# Patient Record
Sex: Male | Born: 1950
Health system: Southern US, Community
[De-identification: ages and names within clinical notes are randomized; demographics above are authoritative.]

## PROBLEM LIST (undated history)

## (undated) DIAGNOSIS — R51 Headache: Secondary | ICD-10-CM

## (undated) DIAGNOSIS — I10 Essential (primary) hypertension: Secondary | ICD-10-CM

## (undated) DIAGNOSIS — R519 Headache, unspecified: Secondary | ICD-10-CM

## (undated) DIAGNOSIS — T7840XA Allergy, unspecified, initial encounter: Secondary | ICD-10-CM

## (undated) DIAGNOSIS — C801 Malignant (primary) neoplasm, unspecified: Secondary | ICD-10-CM

## (undated) DIAGNOSIS — F0781 Postconcussional syndrome: Secondary | ICD-10-CM

## (undated) DIAGNOSIS — E119 Type 2 diabetes mellitus without complications: Secondary | ICD-10-CM

## (undated) HISTORY — PX: COLONOSCOPY: SHX174

## (undated) HISTORY — PX: WISDOM TOOTH EXTRACTION: SHX21

## (undated) HISTORY — DX: Headache: R51

## (undated) HISTORY — DX: Allergy, unspecified, initial encounter: T78.40XA

## (undated) HISTORY — DX: Headache, unspecified: R51.9

## (undated) MED FILL — Fosaprepitant Dimeglumine For IV Infusion 150 MG (Base Eq): INTRAVENOUS | Qty: 5 | Status: AC

---
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
## 2010-09-10 ENCOUNTER — Emergency Department: Payer: Self-pay | Admitting: Emergency Medicine

## 2010-10-12 ENCOUNTER — Emergency Department: Payer: Self-pay | Admitting: Emergency Medicine

## 2010-12-28 ENCOUNTER — Emergency Department: Payer: Self-pay | Admitting: Emergency Medicine

## 2012-11-28 ENCOUNTER — Emergency Department: Payer: Self-pay | Admitting: Emergency Medicine

## 2014-10-13 ENCOUNTER — Other Ambulatory Visit: Payer: Self-pay | Admitting: Neurology

## 2014-10-13 DIAGNOSIS — G44321 Chronic post-traumatic headache, intractable: Secondary | ICD-10-CM

## 2014-10-17 ENCOUNTER — Ambulatory Visit
Admission: RE | Admit: 2014-10-17 | Discharge: 2014-10-17 | Disposition: A | Discharge status: Home / Self Care | Payer: Medicare HMO | Source: Ambulatory Visit | Attending: Neurology | Admitting: Neurology

## 2014-10-17 DIAGNOSIS — G44321 Chronic post-traumatic headache, intractable: Secondary | ICD-10-CM | POA: Insufficient documentation

## 2014-10-17 MED ORDER — GADOBENATE DIMEGLUMINE 529 MG/ML IV SOLN: 20.0000 mL | Freq: Once | INTRAVENOUS | Status: AC | PRN

## 2015-10-18 ENCOUNTER — Encounter: Payer: Self-pay | Admitting: Emergency Medicine

## 2015-10-18 ENCOUNTER — Emergency Department
Admission: EM | Admit: 2015-10-18 | Discharge: 2015-10-18 | Disposition: A | Discharge status: Home / Self Care | Payer: Medicare HMO | Attending: Emergency Medicine | Admitting: Emergency Medicine

## 2015-10-18 DIAGNOSIS — E1165 Type 2 diabetes mellitus with hyperglycemia: Secondary | ICD-10-CM | POA: Insufficient documentation

## 2015-10-18 DIAGNOSIS — R739 Hyperglycemia, unspecified: Secondary | ICD-10-CM

## 2015-10-18 DIAGNOSIS — F17210 Nicotine dependence, cigarettes, uncomplicated: Secondary | ICD-10-CM | POA: Diagnosis not present

## 2015-10-18 DIAGNOSIS — R350 Frequency of micturition: Secondary | ICD-10-CM | POA: Insufficient documentation

## 2015-10-18 DIAGNOSIS — I10 Essential (primary) hypertension: Secondary | ICD-10-CM | POA: Diagnosis not present

## 2015-10-18 HISTORY — DX: Postconcussional syndrome: F07.81

## 2015-10-18 HISTORY — DX: Essential (primary) hypertension: I10

## 2015-10-18 HISTORY — DX: Type 2 diabetes mellitus without complications: E11.9

## 2015-10-18 LAB — BASIC METABOLIC PANEL
Anion gap: 12 (ref 5–15)
BUN: 39 mg/dL — AB (ref 6–20)
CALCIUM: 9.7 mg/dL (ref 8.9–10.3)
CO2: 23 mmol/L (ref 22–32)
Chloride: 95 mmol/L — ABNORMAL LOW (ref 101–111)
Creatinine, Ser: 2.06 mg/dL — ABNORMAL HIGH (ref 0.61–1.24)
GFR calc Af Amer: 38 mL/min — ABNORMAL LOW (ref >60)
GFR, EST NON AFRICAN AMERICAN: 32 mL/min — AB (ref >60)
Glucose, Bld: 642 mg/dL (ref 65–99)
POTASSIUM: 4.3 mmol/L (ref 3.5–5.1)
SODIUM: 130 mmol/L — AB (ref 135–145)

## 2015-10-18 LAB — CBC
HCT: 43.6 % (ref 40.0–52.0)
Hemoglobin: 15.2 g/dL (ref 13.0–18.0)
MCH: 31.3 pg (ref 26.0–34.0)
MCHC: 34.8 g/dL (ref 32.0–36.0)
MCV: 90 fL (ref 80.0–100.0)
PLATELETS: 147 10*3/uL — AB (ref 150–440)
RBC: 4.84 MIL/uL (ref 4.40–5.90)
RDW: 13.4 % (ref 11.5–14.5)
WBC: 4.9 10*3/uL (ref 3.8–10.6)

## 2015-10-18 LAB — URINALYSIS COMPLETE WITH MICROSCOPIC (ARMC ONLY)
BACTERIA UA: NONE SEEN
BILIRUBIN URINE: NEGATIVE
Leukocytes, UA: NEGATIVE
Nitrite: NEGATIVE
Protein, ur: NEGATIVE mg/dL
SPECIFIC GRAVITY, URINE: 1.025 (ref 1.005–1.030)
Squamous Epithelial / LPF: NONE SEEN
pH: 5 (ref 5.0–8.0)

## 2015-10-18 LAB — GLUCOSE, CAPILLARY
GLUCOSE-CAPILLARY: 360 mg/dL — AB (ref 65–99)
Glucose-Capillary: >600 mg/dL (ref 65–99)
Glucose-Capillary: 544 mg/dL — ABNORMAL HIGH (ref 65–99)
Glucose-Capillary: 437 mg/dL — ABNORMAL HIGH (ref 65–99)

## 2015-10-18 MED ORDER — SODIUM CHLORIDE 0.9 % IV BOLUS (SEPSIS)
1000.0000 mL | Freq: Once | INTRAVENOUS | Status: AC
  Administered 2015-10-18: 1000 mL via INTRAVENOUS

## 2015-10-18 MED ORDER — INSULIN ASPART 100 UNIT/ML ~~LOC~~ SOLN
10.0000 [IU] | Freq: Once | SUBCUTANEOUS | Status: AC
Start: 2015-10-18 — End: 2015-10-18
  Administered 2015-10-18: 10 [IU] via INTRAVENOUS
  Filled 2015-10-18: qty 10

## 2015-10-18 MED ORDER — METFORMIN HCL 500 MG PO TABS: 500.0000 mg | ORAL_TABLET | Freq: Two times a day (BID) | ORAL | Status: DC

## 2015-10-18 NOTE — Discharge Instructions (Signed)
Hyperglycemia °Hyperglycemia occurs when the glucose (sugar) in your blood is too high. Hyperglycemia can happen for many reasons, but it most often happens to people who do not know they have diabetes or are not managing their diabetes properly.  °CAUSES  °Whether you have diabetes or not, there are other causes of hyperglycemia. Hyperglycemia can occur when you have diabetes, but it can also occur in other situations that you might not be as aware of, such as: °Diabetes °· If you have diabetes and are having problems controlling your blood glucose, hyperglycemia could occur because of some of the following reasons: °· Not following your meal plan. °· Not taking your diabetes medications or not taking it properly. °· Exercising less or doing less activity than you normally do. °· Being sick. °Pre-diabetes °· This cannot be ignored. Before people develop Type 2 diabetes, they almost always have "pre-diabetes." This is when your blood glucose levels are higher than normal, but not yet high enough to be diagnosed as diabetes. Research has shown that some long-term damage to the body, especially the heart and circulatory system, may already be occurring during pre-diabetes. If you take action to manage your blood glucose when you have pre-diabetes, you may delay or prevent Type 2 diabetes from developing. °Stress °· If you have diabetes, you may be "diet" controlled or on oral medications or insulin to control your diabetes. However, you may find that your blood glucose is higher than usual in the hospital whether you have diabetes or not. This is often referred to as "stress hyperglycemia." Stress can elevate your blood glucose. This happens because of hormones put out by the body during times of stress. If stress has been the cause of your high blood glucose, it can be followed regularly by your caregiver. That way he/she can make sure your hyperglycemia does not continue to get worse or progress to  diabetes. °Steroids °· Steroids are medications that act on the infection fighting system (immune system) to block inflammation or infection. One side effect can be a rise in blood glucose. Most people can produce enough extra insulin to allow for this rise, but for those who cannot, steroids make blood glucose levels go even higher. It is not unusual for steroid treatments to "uncover" diabetes that is developing. It is not always possible to determine if the hyperglycemia will go away after the steroids are stopped. A special blood test called an A1c is sometimes done to determine if your blood glucose was elevated before the steroids were started. °SYMPTOMS °· Thirsty. °· Frequent urination. °· Dry mouth. °· Blurred vision. °· Tired or fatigue. °· Weakness. °· Sleepy. °· Tingling in feet or leg. °DIAGNOSIS  °Diagnosis is made by monitoring blood glucose in one or all of the following ways: °· A1c test. This is a chemical found in your blood. °· Fingerstick blood glucose monitoring. °· Laboratory results. °TREATMENT  °First, knowing the cause of the hyperglycemia is important before the hyperglycemia can be treated. Treatment may include, but is not be limited to: °· Education. °· Change or adjustment in medications. °· Change or adjustment in meal plan. °· Treatment for an illness, infection, etc. °· More frequent blood glucose monitoring. °· Change in exercise plan. °· Decreasing or stopping steroids. °· Lifestyle changes. °HOME CARE INSTRUCTIONS  °· Test your blood glucose as directed. °· Exercise regularly. Your caregiver will give you instructions about exercise. Pre-diabetes or diabetes which comes on with stress is helped by exercising. °· Eat wholesome,   balanced meals. Eat often and at regular, fixed times. Your caregiver or nutritionist will give you a meal plan to guide your sugar intake.  Being at an ideal weight is important. If needed, losing as little as 10 to 15 pounds may help improve blood  glucose levels. SEEK MEDICAL CARE IF:   You have questions about medicine, activity, or diet.  You continue to have symptoms (problems such as increased thirst, urination, or weight gain). SEEK IMMEDIATE MEDICAL CARE IF:   You are vomiting or have diarrhea.  Your breath smells fruity.  You are breathing faster or slower.  You are very sleepy or incoherent.  You have numbness, tingling, or pain in your feet or hands.  You have chest pain.  Your symptoms get worse even though you have been following your caregiver's orders.  If you have any other questions or concerns.   This information is not intended to replace advice given to you by your health care provider. Make sure you discuss any questions you have with your health care provider.   Document Released: 11/23/2000 Document Revised: 08/22/2011 Document Reviewed: 02/03/2015 Elsevier Interactive Patient Education 2016 [employer].  Urinary Frequency The number of times a normal person urinates depends upon how much liquid they take in and how much liquid they are losing. If the temperature is hot and there is high humidity, then the person will sweat more and usually breathe a little more frequently. These factors decrease the amount of frequency of urination that would be considered normal. The amount you drink is easily determined, but the amount of fluid lost is sometimes more difficult to calculate.  Fluid is lost in two ways:  Sensible fluid loss is usually measured by the amount of urine that you get rid of. Losses of fluid can also occur with diarrhea.  Insensible fluid loss is more difficult to measure. It is caused by evaporation. Insensible loss of fluid occurs through breathing and sweating. It usually ranges from a little less than a quart to a little more than a quart of fluid a day. In normal temperatures and activity levels, the average person may urinate 4 to 7 times in a 24-hour period. Needing to urinate more  often than that could indicate a problem. If one urinates 4 to 7 times in 24 hours and has large volumes each time, that could indicate a different problem from one who urinates 4 to 7 times a day and has small volumes. The time of urinating is also important. Most urinating should be done during the waking hours. Getting up at night to urinate frequently can indicate some problems. CAUSES  The bladder is the organ in your lower abdomen that holds urine. Like a balloon, it swells some as it fills up. Your nerves sense this and tell you it is time to head for the bathroom. There are a number of reasons that you might feel the need to urinate more often than usual. They include:  Urinary tract infection. This is usually associated with other signs such as burning when you urinate.  In men, problems with the prostate (a walnut-size gland that is located near the tube that carries urine out of your body). There are two reasons why the prostate can cause an increased frequency of urination:  An enlarged prostate that does not let the bladder empty well. If the bladder only half empties when you urinate, then it only has half the capacity to fill before you have to urinate again.  The nerves in the bladder become more hypersensitive with an increased size of the prostate even if the bladder empties completely.  Pregnancy.  Obesity. Excess weight is more likely to cause a problem for women than for men.  Bladder stones or other bladder problems.  Caffeine.  Alcohol.  Medications. For example, drugs that help the body get rid of extra fluid (diuretics) increase urine production. Some other medicines must be taken with lots of fluids.  Muscle or nerve weakness. This might be the result of a spinal cord injury, a stroke, multiple sclerosis, or Parkinson disease.  Long-standing diabetes can decrease the sensation of the bladder. This loss of sensation makes it harder to sense the bladder needs to be  emptied. Over a period of years, the bladder is stretched out by constant overfilling. This weakens the bladder muscles so that the bladder does not empty well and has less capacity to fill with new urine.  Interstitial cystitis (also called painful bladder syndrome). This condition develops because the tissues that line the inside of the bladder are inflamed (inflammation is the body's way of reacting to injury or infection). It causes pain and frequent urination. It occurs in women more often than in men. DIAGNOSIS   To decide what might be causing your urinary frequency, your health care provider will probably:  Ask about symptoms you have noticed.  Ask about your overall health. This will include questions about any medications you are taking.  Do a physical examination.  Order some tests. These might include:  A blood test to check for diabetes or other health issues that could be contributing to the problem.  Urine testing. This could measure the flow of urine and the pressure on the bladder.  A test of your neurological system (the brain, spinal cord, and nerves). This is the system that senses the need to urinate.  A bladder test to check whether it is emptying completely when you urinate.  Cystoscopy. This test uses a thin tube with a tiny camera on it. It offers a look inside your urethra and bladder to see if there are problems.  Imaging tests. You might be given a contrast dye and then asked to urinate. X-rays are taken to see how your bladder is working. TREATMENT  It is important for you to be evaluated to determine if the amount or frequency that you have is unusual or abnormal. If it is found to be abnormal, the cause should be determined and this can usually be found out easily. Depending upon the cause, treatment could include medication, stimulation of the nerves, or surgery. There are not too many things that you can do as an individual to change your urinary  frequency. It is important that you balance the amount of fluid intake needed to compensate for your activity and the temperature. Medical problems will be diagnosed and taken care of by your physician. There is no particular bladder training such as Kegel exercises that you can do to help urinary frequency. This is an exercise that is usually recommended for people who have leaking of urine when they laugh, cough, or sneeze. HOME CARE INSTRUCTIONS   Take any medications your health care provider prescribed or suggested. Follow the directions carefully.  Practice any lifestyle changes that are recommended. These might include:  Drinking less fluid or drinking at different times of the day. If you need to urinate often during the night, for example, you may need to stop drinking fluids early in the  evening.  Cutting down on caffeine or alcohol. They both can make you need to urinate more often than normal. Caffeine is found in coffee, tea, and sodas.  Losing weight, if that is recommended.  Keep a journal or a log. You might be asked to record how much you drink and when and where you feel the need to urinate. This will also help evaluate how well the treatment provided by your physician is working. SEEK MEDICAL CARE IF:   Your need to urinate often gets worse.  You feel increased pain or irritation when you urinate.  You notice blood in your urine.  You have questions about any medications that your health care provider recommended.  You notice blood, pus, or swelling at the site of any test or treatment procedure.  You develop a fever of more than 100.16F (38.1C). SEEK IMMEDIATE MEDICAL CARE IF:  You develop a fever of more than 102.54F (38.9C).   This information is not intended to replace advice given to you by your health care provider. Make sure you discuss any questions you have with your health care provider.   Document Released: 03/26/2009 Document Revised: 06/20/2014  Document Reviewed: 03/26/2009 Elsevier Interactive Patient Education [employer].

## 2015-10-18 NOTE — ED Provider Notes (Signed)
[facility] Emergency Department Provider Note   ____________________________________________  Time seen: Approximately [id number] AM  I have reviewed the triage vital signs and the nursing notes.   HISTORY  Chief Complaint Urinary Frequency    HPI Jonathan Page is a 65 y.o. male who comes into the hospital today with increased urinary frequency and increased thirst. The patient reports that this going on for the past 2-3 weeks. His wife prompted him to come into the hospital today. He has not been to his doctor's office in some time as he is trying to obtain a new physician. He reports that he does not check his sugars regularly at home. He denies any chest pain denies shortness of breath denies headache. He's had some blurred vision but no nausea or vomiting no abdominal pain. He reports that he has been off his metformin for the past year and he was taking insulin for about 30 days but again he is not taking anything currently. He also has not been working out as much as normal. The patient is here for evaluation this evening.   Past Medical History  Diagnosis Date  . Diabetes ([place])   . Concussion syndrome   . Hypertension     There are no active problems to display for this patient.   History reviewed. No pertinent past surgical history.  Current Outpatient Rx  Name  Route  Sig  Dispense  Refill  . metFORMIN (GLUCOPHAGE) 500 MG tablet   Oral   Take 1 tablet (500 mg total) by mouth 2 (two) times daily with a meal.   60 tablet   0     Allergies Review of patient's allergies indicates no known allergies.  History reviewed. No pertinent family history.  Social History Social History  Substance Use Topics  . Smoking status: Current Every Day Smoker -- 3.00 packs/day    Types: Cigarettes  . Smokeless tobacco: None  . Alcohol Use: Yes     Comment: occasional beer    Review of Systems Constitutional: Increased thirst with No  fever/chills Eyes: No visual changes. ENT: No sore throat. Cardiovascular: Denies chest pain. Respiratory: Denies shortness of breath. Gastrointestinal: No abdominal pain.  No nausea, no vomiting.  No diarrhea.  No constipation. Genitourinary: Urinary frequency Musculoskeletal: Negative for back pain. Skin: Negative for rash. Neurological: Negative for headaches, focal weakness or numbness.  10-point ROS otherwise negative.  ____________________________________________   PHYSICAL EXAM:  VITAL SIGNS: ED Triage Vitals  Enc Vitals Group     BP 10/18/15 0521 116/79 mmHg     Pulse Rate 10/18/15 0521 82     Resp 10/18/15 0521 18     Temp 10/18/15 0521 97.8 F (36.6 C)     Temp Source 10/18/15 0521 Oral     SpO2 10/18/15 0521 97 %     Weight 10/18/15 0521 209 lb (94.802 kg)     Height 10/18/15 0521 6\' 1"  (1.854 m)     Head Cir --      Peak Flow --      Pain Score 10/18/15 0522 0     Pain Loc --      Pain Edu? --      Excl. in [place]? --     Constitutional: Alert and oriented. Well appearing and in no acute distress. Eyes: Conjunctivae are normal. PERRL. EOMI. Head: Atraumatic. Nose: No congestion/rhinnorhea. Mouth/Throat: Mucous membranes are moist.  Oropharynx non-erythematous. Cardiovascular: Normal rate, regular rhythm. Grossly normal heart sounds.  Good peripheral circulation. Respiratory: Normal respiratory effort.  No retractions. Lungs CTAB. Gastrointestinal: Soft and nontender. No distention. Positive bowel sounds Musculoskeletal: No lower extremity tenderness nor edema.   Neurologic:  Normal speech and language.  Skin:  Skin is warm, dry and intact. Psychiatric: Mood and affect are normal.   ____________________________________________   LABS (all labs ordered are listed, but only abnormal results are displayed)  Labs Reviewed  URINALYSIS COMPLETEWITH MICROSCOPIC ([place]) - Abnormal; Notable for the following:    Color, Urine STRAW (*)    APPearance CLEAR  (*)    Glucose, UA >500 (*)    Ketones, ur TRACE (*)    Hgb urine dipstick 1+ (*)    All other components within normal limits  GLUCOSE, CAPILLARY - Abnormal; Notable for the following:    Glucose-Capillary >600 (*)    All other components within normal limits  BASIC METABOLIC PANEL - Abnormal; Notable for the following:    Sodium 130 (*)    Chloride 95 (*)    Glucose, Bld 642 (*)    BUN 39 (*)    Creatinine, Ser 2.06 (*)    GFR calc non Af Amer 32 (*)    GFR calc Af Amer 38 (*)    All other components within normal limits  CBC - Abnormal; Notable for the following:    Platelets 147 (*)    All other components within normal limits  GLUCOSE, CAPILLARY - Abnormal; Notable for the following:    Glucose-Capillary 544 (*)    All other components within normal limits  CBG MONITORING, ED   ____________________________________________  EKG  None ____________________________________________  RADIOLOGY  None ____________________________________________   PROCEDURES  Procedure(s) performed: None  Critical Care performed: No  ____________________________________________   INITIAL IMPRESSION / ASSESSMENT AND PLAN / ED COURSE  Pertinent labs & imaging results that were available during my care of the patient were reviewed by me and considered in my medical decision making (see chart for details).  The patient is a 65 year old male who comes into the hospital today with polyuria and polydipsia. The patient's fingerstick blood sugar is greater than 700. He did receive a liter of normal saline and should he needed he will also receive some insulin. We will continue to reassess the patient at this time.  After a liter of normal saline the patient's glucose came down to 544. He will receive a second liter of normal saline as well as 10 units of insulin. His care will be signed out to Dr.Kinner who will follow-up the repeat CBG and disposition the patient. The patient has no  concerns at this time. ____________________________________________   FINAL CLINICAL IMPRESSION(S) / ED DIAGNOSES  Final diagnoses:  Hyperglycemia  Urinary frequency      NEW MEDICATIONS STARTED DURING THIS VISIT:  New Prescriptions   METFORMIN (GLUCOPHAGE) 500 MG TABLET    Take 1 tablet (500 mg total) by mouth 2 (two) times daily with a meal.     Note:  This document was prepared using Dragon voice recognition software and may include unintentional dictation errors.    Loney Hering, MD 10/18/15 [phone]

## 2015-10-18 NOTE — ED Notes (Signed)
Pt c/o urinary frequency over the last 2-3 weeks; has not seen his MD in [place] for same; history of diabetes and was taken off Metformin about 1 year ago because blood sugars were normal; pt denies fever; denies dysuria; denies any other symptoms

## 2015-10-18 NOTE — ED Provider Notes (Signed)
Patient's glucose has improved and is stable for discharge per Dr. Dahlia Client instructions  Jonathan Drafts, MD 10/18/15 1043

## 2015-10-22 ENCOUNTER — Encounter: Payer: Self-pay | Admitting: Emergency Medicine

## 2015-10-22 ENCOUNTER — Inpatient Hospital Stay
Admission: EM | Admit: 2015-10-22 | Discharge: 2015-10-23 | DRG: 638 | Disposition: A | Discharge status: Home / Self Care | Payer: Medicare HMO | Attending: Internal Medicine | Admitting: Internal Medicine

## 2015-10-22 DIAGNOSIS — N179 Acute kidney failure, unspecified: Secondary | ICD-10-CM | POA: Diagnosis present

## 2015-10-22 DIAGNOSIS — E871 Hypo-osmolality and hyponatremia: Secondary | ICD-10-CM

## 2015-10-22 DIAGNOSIS — Z794 Long term (current) use of insulin: Secondary | ICD-10-CM | POA: Diagnosis not present

## 2015-10-22 DIAGNOSIS — E1100 Type 2 diabetes mellitus with hyperosmolarity without nonketotic hyperglycemic-hyperosmolar coma (NKHHC): Secondary | ICD-10-CM | POA: Diagnosis present

## 2015-10-22 DIAGNOSIS — I129 Hypertensive chronic kidney disease with stage 1 through stage 4 chronic kidney disease, or unspecified chronic kidney disease: Secondary | ICD-10-CM | POA: Diagnosis present

## 2015-10-22 DIAGNOSIS — N183 Chronic kidney disease, stage 3 (moderate): Secondary | ICD-10-CM | POA: Diagnosis present

## 2015-10-22 DIAGNOSIS — E119 Type 2 diabetes mellitus without complications: Secondary | ICD-10-CM | POA: Diagnosis present

## 2015-10-22 DIAGNOSIS — E1122 Type 2 diabetes mellitus with diabetic chronic kidney disease: Secondary | ICD-10-CM | POA: Diagnosis present

## 2015-10-22 DIAGNOSIS — E1165 Type 2 diabetes mellitus with hyperglycemia: Secondary | ICD-10-CM | POA: Diagnosis present

## 2015-10-22 DIAGNOSIS — F17210 Nicotine dependence, cigarettes, uncomplicated: Secondary | ICD-10-CM | POA: Diagnosis present

## 2015-10-22 DIAGNOSIS — E875 Hyperkalemia: Secondary | ICD-10-CM | POA: Diagnosis present

## 2015-10-22 DIAGNOSIS — Z833 Family history of diabetes mellitus: Secondary | ICD-10-CM | POA: Diagnosis not present

## 2015-10-22 DIAGNOSIS — E1121 Type 2 diabetes mellitus with diabetic nephropathy: Secondary | ICD-10-CM | POA: Diagnosis present

## 2015-10-22 DIAGNOSIS — R739 Hyperglycemia, unspecified: Secondary | ICD-10-CM

## 2015-10-22 DIAGNOSIS — N289 Disorder of kidney and ureter, unspecified: Secondary | ICD-10-CM

## 2015-10-22 DIAGNOSIS — E860 Dehydration: Secondary | ICD-10-CM | POA: Diagnosis present

## 2015-10-22 LAB — URINALYSIS COMPLETE WITH MICROSCOPIC (ARMC ONLY)
Bacteria, UA: NONE SEEN
Bilirubin Urine: NEGATIVE
Hgb urine dipstick: NEGATIVE
Leukocytes, UA: NEGATIVE
Nitrite: NEGATIVE
PROTEIN: NEGATIVE mg/dL
SPECIFIC GRAVITY, URINE: 1.02 (ref 1.005–1.030)
pH: 6 (ref 5.0–8.0)

## 2015-10-22 LAB — GLUCOSE, CAPILLARY
GLUCOSE-CAPILLARY: 428 mg/dL — AB (ref 65–99)
Glucose-Capillary: 146 mg/dL — ABNORMAL HIGH (ref 65–99)
Glucose-Capillary: 198 mg/dL — ABNORMAL HIGH (ref 65–99)
Glucose-Capillary: 335 mg/dL — ABNORMAL HIGH (ref 65–99)
Glucose-Capillary: 515 mg/dL — ABNORMAL HIGH (ref 65–99)
Glucose-Capillary: >600 mg/dL (ref 65–99)
Glucose-Capillary: >600 mg/dL (ref 65–99)

## 2015-10-22 LAB — CBC
HCT: 43.7 % (ref 40.0–52.0)
Hemoglobin: 14.3 g/dL (ref 13.0–18.0)
MCH: 30.8 pg (ref 26.0–34.0)
MCHC: 32.6 g/dL (ref 32.0–36.0)
MCV: 94.5 fL (ref 80.0–100.0)
PLATELETS: 150 10*3/uL (ref 150–440)
RBC: 4.63 MIL/uL (ref 4.40–5.90)
RDW: 13.5 % (ref 11.5–14.5)
WBC: 6.5 10*3/uL (ref 3.8–10.6)

## 2015-10-22 LAB — BASIC METABOLIC PANEL
Anion gap: 14 (ref 5–15)
Anion gap: 8 (ref 5–15)
BUN: 52 mg/dL — AB (ref 6–20)
BUN: 44 mg/dL — ABNORMAL HIGH (ref 6–20)
CALCIUM: 9.1 mg/dL (ref 8.9–10.3)
CHLORIDE: 81 mmol/L — AB (ref 101–111)
CHLORIDE: 98 mmol/L — AB (ref 101–111)
CO2: 22 mmol/L (ref 22–32)
CO2: 26 mmol/L (ref 22–32)
CREATININE: 2.9 mg/dL — AB (ref 0.61–1.24)
CREATININE: 2.3 mg/dL — AB (ref 0.61–1.24)
Calcium: 9.2 mg/dL (ref 8.9–10.3)
GFR calc Af Amer: 25 mL/min — ABNORMAL LOW (ref >60)
GFR calc non Af Amer: 21 mL/min — ABNORMAL LOW (ref >60)
GFR calc non Af Amer: 28 mL/min — ABNORMAL LOW (ref >60)
GFR, EST AFRICAN AMERICAN: 33 mL/min — AB (ref >60)
GLUCOSE: 1095 mg/dL — AB (ref 65–99)
Glucose, Bld: 451 mg/dL — ABNORMAL HIGH (ref 65–99)
POTASSIUM: 3.5 mmol/L (ref 3.5–5.1)
Potassium: 5.4 mmol/L — ABNORMAL HIGH (ref 3.5–5.1)
Sodium: 117 mmol/L — CL (ref 135–145)
Sodium: 132 mmol/L — ABNORMAL LOW (ref 135–145)

## 2015-10-22 LAB — MRSA PCR SCREENING: MRSA BY PCR: NEGATIVE

## 2015-10-22 MED ORDER — SODIUM CHLORIDE 0.9% FLUSH
3.0000 mL | Freq: Two times a day (BID) | INTRAVENOUS | Status: DC
  Administered 2015-10-23: 3 mL via INTRAVENOUS

## 2015-10-22 MED ORDER — SODIUM CHLORIDE 0.9 % IV SOLN
INTRAVENOUS | Status: DC
  Administered 2015-10-22 – 2015-10-23 (×3): via INTRAVENOUS

## 2015-10-22 MED ORDER — ACETAMINOPHEN 650 MG RE SUPP: 650.0000 mg | Freq: Four times a day (QID) | RECTAL | Status: DC | PRN

## 2015-10-22 MED ORDER — SODIUM CHLORIDE 0.9 % IV SOLN
INTRAVENOUS | Status: DC
  Administered 2015-10-22: 5.4 [IU]/h via INTRAVENOUS
  Filled 2015-10-22: qty 2.5

## 2015-10-22 MED ORDER — ONDANSETRON HCL 4 MG PO TABS: 4.0000 mg | ORAL_TABLET | Freq: Four times a day (QID) | ORAL | Status: DC | PRN

## 2015-10-22 MED ORDER — ENOXAPARIN SODIUM 30 MG/0.3ML ~~LOC~~ SOLN
30.0000 mg | SUBCUTANEOUS | Status: DC
Start: 2015-10-22 — End: 2015-10-23
  Administered 2015-10-22: 30 mg via SUBCUTANEOUS
  Filled 2015-10-22: qty 0.3

## 2015-10-22 MED ORDER — NICOTINE 14 MG/24HR TD PT24
14.0000 mg | MEDICATED_PATCH | Freq: Every day | TRANSDERMAL | Status: DC
  Administered 2015-10-22 – 2015-10-23 (×2): 14 mg via TRANSDERMAL
  Filled 2015-10-22 (×2): qty 1

## 2015-10-22 MED ORDER — ONDANSETRON HCL 4 MG/2ML IJ SOLN: 4.0000 mg | Freq: Four times a day (QID) | INTRAMUSCULAR | Status: DC | PRN

## 2015-10-22 MED ORDER — SODIUM CHLORIDE 0.9 % IV SOLN
Freq: Once | INTRAVENOUS | Status: AC
  Administered 2015-10-22: 18:00:00 via INTRAVENOUS

## 2015-10-22 MED ORDER — METOPROLOL TARTRATE 25 MG PO TABS
12.5000 mg | ORAL_TABLET | Freq: Two times a day (BID) | ORAL | Status: DC
  Administered 2015-10-22 – 2015-10-23 (×2): 12.5 mg via ORAL
  Filled 2015-10-22 (×2): qty 1

## 2015-10-22 MED ORDER — NICOTINE 14 MG/24HR TD PT24: 14.0000 mg | MEDICATED_PATCH | Freq: Every day | TRANSDERMAL | Status: DC

## 2015-10-22 MED ORDER — SODIUM CHLORIDE 0.9 % IV BOLUS (SEPSIS)
1000.0000 mL | Freq: Once | INTRAVENOUS | Status: AC
  Administered 2015-10-22: 1000 mL via INTRAVENOUS

## 2015-10-22 MED ORDER — DOCUSATE SODIUM 100 MG PO CAPS
100.0000 mg | ORAL_CAPSULE | Freq: Two times a day (BID) | ORAL | Status: DC
  Administered 2015-10-22 – 2015-10-23 (×2): 100 mg via ORAL
  Filled 2015-10-22 (×2): qty 1

## 2015-10-22 MED ORDER — ACETAMINOPHEN 325 MG PO TABS
650.0000 mg | ORAL_TABLET | Freq: Four times a day (QID) | ORAL | Status: DC | PRN
Start: 2015-10-22 — End: 2015-10-23

## 2015-10-22 MED ORDER — ASPIRIN EC 81 MG PO TBEC
81.0000 mg | DELAYED_RELEASE_TABLET | Freq: Every day | ORAL | Status: DC
  Administered 2015-10-23: 81 mg via ORAL
  Filled 2015-10-22 (×2): qty 1

## 2015-10-22 NOTE — H&P (Signed)
[facility] [place] NAME: Jonathan Page    MR#:  [id number]  DATE OF BIRTH:  1950/07/01  DATE OF ADMISSION:  10/22/2015  PRIMARY CARE PHYSICIAN: Emeterio Reeve, MD   REQUESTING/REFERRING PHYSICIAN: Dr. Jimmye Norman  CHIEF COMPLAINT:  His blood sugars  HISTORY OF PRESENT ILLNESS:  Jonathan Page  is a 65 y.o. male with a known history of Diabetes mellitus on metformin and essential hypertension is presenting to the ED with a chief complaint of frequent urination and blurry vision associated with generalized weakness. Denies any chest pain or shortness of breath. Patient takes metformin for diabetes mellitus and was evaluated by the ED physician last week for the same.  patient also is reporting being thirsty and frequent urination. In the ED patient's blood sugar is at 1095. Patient was given fluid boluses and insulin drip was initiated by the ED physician. Patient is resting comfortably during my examination. He reports that he is compliant with his medication  PAST MEDICAL HISTORY:   Past Medical History  Diagnosis Date  . Diabetes ([place])   . Concussion syndrome   . Hypertension     PAST SURGICAL HISTOIRY:  History reviewed. No pertinent past surgical history.  SOCIAL HISTORY:   Social History  Substance Use Topics  . Smoking status: Current Every Day Smoker -- 3.00 packs/day    Types: Cigarettes  . Smokeless tobacco: Not on file  . Alcohol Use: Yes     Comment: occasional beer    FAMILY HISTORY:  Father has history of diabetes mellitus  DRUG ALLERGIES:  No Known Allergies  REVIEW OF SYSTEMS:  CONSTITUTIONAL: No fever, Reporting fatigue  EYES: No blurred or double vision.  EARS, NOSE, AND THROAT: No tinnitus or ear pain.  RESPIRATORY: No cough, shortness of breath, wheezing or hemoptysis.  CARDIOVASCULAR: No chest pain, orthopnea, edema.  GASTROINTESTINAL: No nausea, vomiting, diarrhea or abdominal pain.  GENITOURINARY: No  dysuria, hematuria.  ENDOCRINE: Reporting polyphagia, polydipsia and  polyuria, nocturia,  HEMATOLOGY: No anemia, easy bruising or bleeding SKIN: No rash or lesion. MUSCULOSKELETAL: No joint pain or arthritis.   NEUROLOGIC: No tingling, numbness, weakness.  PSYCHIATRY: No anxiety or depression.   MEDICATIONS AT HOME:   Prior to Admission medications   Medication Sig Start Date End Date Taking? Authorizing Provider  aspirin EC 81 MG tablet Take 81 mg by mouth daily.   Yes Historical Provider, MD  lisinopril-hydrochlorothiazide (PRINZIDE,ZESTORETIC) 20-25 MG tablet Take 1 tablet by mouth daily.   Yes Historical Provider, MD  metFORMIN (GLUCOPHAGE) 500 MG tablet Take 1 tablet (500 mg total) by mouth 2 (two) times daily with a meal. 10/18/15 10/17/16 Yes Loney Hering, MD      VITAL SIGNS:  Blood pressure 112/57, pulse 89, temperature 97.8 F (36.6 C), temperature source Oral, resp. rate 18, height 6\' 1"  (1.854 m), weight 94.802 kg (209 lb), SpO2 95 %.  PHYSICAL EXAMINATION:  GENERAL:  65 y.o.-year-old patient lying in the bed with no acute distress.  EYES: Pupils equal, round, reactive to light and accommodation. No scleral icterus. Extraocular muscles intact.  HEENT: Head atraumatic, normocephalic. Oropharynx and nasopharynx clear.  dry mucous membranes  NECK:  Supple, no jugular venous distention. No thyroid enlargement, no tenderness.  LUNGS: Normal breath sounds bilaterally, no wheezing, rales,rhonchi or crepitation. No use of accessory muscles of respiration.  CARDIOVASCULAR: S1, S2 normal. No murmurs, rubs, or gallops.  ABDOMEN: Soft, nontender, nondistended. Bowel sounds present. No organomegaly or  mass.  EXTREMITIES: No pedal edema, cyanosis, or clubbing.  NEUROLOGIC: Cranial nerves II through XII are intact. Muscle strength 5/5 in all extremities. Sensation intact. Gait not checked.  PSYCHIATRIC: The patient is alert and oriented x 3.  SKIN: No obvious rash, lesion, or ulcer.    LABORATORY PANEL:   CBC  Recent Labs Lab 10/22/15 1645  WBC 6.5  HGB 14.3  HCT 43.7  PLT 150   ------------------------------------------------------------------------------------------------------------------  Chemistries   Recent Labs Lab 10/22/15 1645  NA 117*  K 5.4*  CL 81*  CO2 22  GLUCOSE 1095*  BUN 52*  CREATININE 2.90*  CALCIUM 9.1   ------------------------------------------------------------------------------------------------------------------  Cardiac Enzymes No results for input(s): TROPONINI in the last 168 hours. ------------------------------------------------------------------------------------------------------------------  RADIOLOGY:  No results found.  EKG:   Orders placed or performed during the hospital encounter of 10/22/15  . ED EKG  . ED EKG  . EKG 12-Lead  . EKG 12-Lead    IMPRESSION AND PLAN:   Jonathan Page  is a 65 y.o. male with a known history of Diabetes mellitus on metformin and essential hypertension is presenting to the ED with a chief complaint of frequent urination and blurry vision associated with generalized weakness.   #hyperosmolar nonketotic hyperglycemia   We will admit the patient to intensive care unit Provide aggressive hydration with IV fluids and patient is started on on insulin drip BMP every 6 hours   check hemoglobin A1c in a.m. check TSH in a.m. Consult with the diabetic coordinator Patient might need to be discharged home with long-acting insulin Consult endocrinology  #Acute renal insufficiency from dehydration from hyperglycemia Provide IV fluids and monitor renal function Avoid nephrotoxins. Holding metformin and lisinopril at this time. If no improvement will consider renal ultrasound and nephrology consult  #Hyperkalemia Continue aggressive hydration with IV fluids and repeat BMP. If potassium is not improved will consider Kayexalate. Potassium should get better with insulin  drip  #Essential hypertension Blood pressure is stable. Holding lisinopril and started the patient on small dose of metoprolol in the interim  #Tobacco abuse disorder consultation to quit smoking for 3-5 minutes Will provide nicotine patch       All the records are reviewed and case discussed with ED provider. Management plans discussed with the patient, family and they are in agreement.  CODE STATUS: FC/MOM hcpoa  TOTAL CRITICAL CARE TIME TAKING CARE OF THIS PATIENT: 50  minutes.    Nicholes Mango M.D on 10/22/2015 at 6:45 PM  Between 7am to 6pm - Pager - [phone]  After 6pm go to [email or web address] - password [email or web address] [facility]  Office  [phone]  CC: Primary care physician; Emeterio Reeve, MD

## 2015-10-22 NOTE — ED Notes (Signed)
Pt presents with high blood sugar and has not urinated as frequently, blurry vision and weakness. Pt was just seen here last week for same.

## 2015-10-22 NOTE — ED Notes (Signed)
Critical lab values reported verbally to Dr Edd Fabian. Glucose 1095 and NA 117.

## 2015-10-22 NOTE — ED Provider Notes (Signed)
[facility] Emergency Department Provider Note        Time seen: ----------------------------------------- 5:43 PM on 10/22/2015 -----------------------------------------    I have reviewed the triage vital signs and the nursing notes.   HISTORY  Chief Complaint Hyperglycemia    HPI Jonathan Page is a 65 y.o. male who presents ER for high blood sugar. Patient states he is urinating frequently, has had blurry vision and weakness. Patient was seen here last week for same and placed on metformin. He denies fevers, chills, chest pain, shortness of breath, vomiting and diarrhea. Patient is had dry mouth and thirst with frequent urination but remarkably denies complaints this time.   Past Medical History  Diagnosis Date  . Diabetes ([place])   . Concussion syndrome   . Hypertension     There are no active problems to display for this patient.   History reviewed. No pertinent past surgical history.  Allergies Review of patient's allergies indicates no known allergies.  Social History Social History  Substance Use Topics  . Smoking status: Current Every Day Smoker -- 3.00 packs/day    Types: Cigarettes  . Smokeless tobacco: None  . Alcohol Use: Yes     Comment: occasional beer    Review of Systems Constitutional: Negative for fever. Eyes: Positive for blurry vision ENT: Negative for sore throat. Cardiovascular: Negative for chest pain. Respiratory: Negative for shortness of breath. Gastrointestinal: Negative for abdominal pain, vomiting and diarrhea. Genitourinary: Positive for polyuria Musculoskeletal: Negative for back pain. Skin: Negative for rash. Neurological: Negative for headaches, positive for weakness  10-point ROS otherwise negative.  ____________________________________________   PHYSICAL EXAM:  VITAL SIGNS: ED Triage Vitals  Enc Vitals Group     BP 10/22/15 1637 112/57 mmHg     Pulse Rate 10/22/15 1637 89     Resp  10/22/15 1637 18     Temp 10/22/15 1637 97.8 F (36.6 C)     Temp Source 10/22/15 1637 Oral     SpO2 10/22/15 1637 95 %     Weight 10/22/15 1637 209 lb (94.802 kg)     Height 10/22/15 1637 6\' 1"  (1.854 m)     Head Cir --      Peak Flow --      Pain Score --      Pain Loc --      Pain Edu? --      Excl. in [place]? --     Constitutional: Alert and oriented. Well appearing and in no distress. Eyes: Conjunctivae are normal. PERRL. Normal extraocular movements. ENT   Head: Normocephalic and atraumatic.   Nose: No congestion/rhinnorhea.   Mouth/Throat: Mucous membranes are moist.   Neck: No stridor. Cardiovascular: Normal rate, regular rhythm. No murmurs, rubs, or gallops. Respiratory: Normal respiratory effort without tachypnea nor retractions. Breath sounds are clear and equal bilaterally. No wheezes/rales/rhonchi. Gastrointestinal: Soft and nontender. Normal bowel sounds Musculoskeletal: Nontender with normal range of motion in all extremities. No lower extremity tenderness nor edema. Neurologic:  Normal speech and language. No gross focal neurologic deficits are appreciated.  Skin:  Skin is warm, dry and intact. No rash noted. Psychiatric: Mood and affect are normal. Speech and behavior are normal.  ____________________________________________  EKG: Interpreted by me.Sinus rhythm with rate 81 bpm, first degree AV block, wide QRS, normal QT interval, right bundle branch block. Normal axis  ____________________________________________  ED COURSE:  Pertinent labs & imaging results that were available during my care of the patient were reviewed by me  and considered in my medical decision making (see chart for details). Patient presents to ER with marked hyperglycemia. We will give IV fluids as well as placed on insulin drip. ____________________________________________    LABS (pertinent positives/negatives)  Labs Reviewed  GLUCOSE, CAPILLARY - Abnormal; Notable for the  following:    Glucose-Capillary >600 (*)    All other components within normal limits  BASIC METABOLIC PANEL - Abnormal; Notable for the following:    Sodium 117 (*)    Potassium 5.4 (*)    Chloride 81 (*)    Glucose, Bld 1095 (*)    BUN 52 (*)    Creatinine, Ser 2.90 (*)    GFR calc non Af Amer 21 (*)    GFR calc Af Amer 25 (*)    All other components within normal limits  URINALYSIS COMPLETEWITH MICROSCOPIC ([facility] ONLY) - Abnormal; Notable for the following:    Color, Urine COLORLESS (*)    APPearance CLEAR (*)    Glucose, UA >500 (*)    Ketones, ur TRACE (*)    Squamous Epithelial / LPF 0-5 (*)    All other components within normal limits  GLUCOSE, CAPILLARY - Abnormal; Notable for the following:    Glucose-Capillary >600 (*)    All other components within normal limits  CBC  CBG MONITORING, ED   ____________________________________________  FINAL ASSESSMENT AND PLAN  Hyperglycemia, renal failure, hyperkalemia, hyponatremia  Plan: Patient with labs as dictated above. Patient presents the ER looking remarkably well with a blood sugar of over 1000. He has pseudohyponatremia, hyperkalemia, acute renal failure. We are giving him IV fluid as well as placing him on an insulin drip. His vital signs are unremarkable. I will discuss with the hospitals for admission.   Earleen Newport, MD   Note: This dictation was prepared with Dragon dictation. Any transcriptional errors that result from this process are unintentional   Earleen Newport, MD 10/22/15 1759

## 2015-10-23 LAB — BASIC METABOLIC PANEL
Anion gap: 8 (ref 5–15)
Anion gap: 7 (ref 5–15)
BUN: 37 mg/dL — AB (ref 6–20)
BUN: 30 mg/dL — AB (ref 6–20)
CALCIUM: 8.9 mg/dL (ref 8.9–10.3)
CALCIUM: 8.7 mg/dL — AB (ref 8.9–10.3)
CHLORIDE: 104 mmol/L (ref 101–111)
CO2: 25 mmol/L (ref 22–32)
CO2: 23 mmol/L (ref 22–32)
CREATININE: 1.65 mg/dL — AB (ref 0.61–1.24)
CREATININE: 1.68 mg/dL — AB (ref 0.61–1.24)
Chloride: 105 mmol/L (ref 101–111)
GFR calc non Af Amer: 42 mL/min — ABNORMAL LOW (ref >60)
GFR calc non Af Amer: 41 mL/min — ABNORMAL LOW (ref >60)
GFR, EST AFRICAN AMERICAN: 49 mL/min — AB (ref >60)
GFR, EST AFRICAN AMERICAN: 48 mL/min — AB (ref >60)
Glucose, Bld: 147 mg/dL — ABNORMAL HIGH (ref 65–99)
Glucose, Bld: 262 mg/dL — ABNORMAL HIGH (ref 65–99)
Potassium: 3.4 mmol/L — ABNORMAL LOW (ref 3.5–5.1)
Potassium: 3.9 mmol/L (ref 3.5–5.1)
SODIUM: 138 mmol/L (ref 135–145)
SODIUM: 134 mmol/L — AB (ref 135–145)

## 2015-10-23 LAB — GLUCOSE, CAPILLARY
GLUCOSE-CAPILLARY: 113 mg/dL — AB (ref 65–99)
GLUCOSE-CAPILLARY: 214 mg/dL — AB (ref 65–99)
Glucose-Capillary: 114 mg/dL — ABNORMAL HIGH (ref 65–99)
Glucose-Capillary: 139 mg/dL — ABNORMAL HIGH (ref 65–99)
Glucose-Capillary: 157 mg/dL — ABNORMAL HIGH (ref 65–99)
Glucose-Capillary: 176 mg/dL — ABNORMAL HIGH (ref 65–99)
Glucose-Capillary: 225 mg/dL — ABNORMAL HIGH (ref 65–99)
Glucose-Capillary: 236 mg/dL — ABNORMAL HIGH (ref 65–99)
Glucose-Capillary: 360 mg/dL — ABNORMAL HIGH (ref 65–99)

## 2015-10-23 LAB — TSH: TSH: 2.195 u[IU]/mL (ref 0.350–4.500)

## 2015-10-23 LAB — HEMOGLOBIN A1C: HEMOGLOBIN A1C: 12.9 % — AB (ref 4.0–6.0)

## 2015-10-23 MED ORDER — INSULIN DETEMIR 100 UNIT/ML ~~LOC~~ SOLN
14.0000 [IU] | SUBCUTANEOUS | Status: DC
  Administered 2015-10-23: 14 [IU] via SUBCUTANEOUS
  Filled 2015-10-23: qty 0.14

## 2015-10-23 MED ORDER — INSULIN ASPART 100 UNIT/ML ~~LOC~~ SOLN
0.0000 [IU] | Freq: Three times a day (TID) | SUBCUTANEOUS | Status: DC
  Administered 2015-10-23: 15 [IU] via SUBCUTANEOUS
  Administered 2015-10-23: 5 [IU] via SUBCUTANEOUS
  Filled 2015-10-23: qty 15
  Filled 2015-10-23: qty 5

## 2015-10-23 MED ORDER — DEXTROSE-NACL 5-0.45 % IV SOLN: INTRAVENOUS | Status: DC

## 2015-10-23 MED ORDER — POTASSIUM CHLORIDE CRYS ER 20 MEQ PO TBCR
40.0000 meq | EXTENDED_RELEASE_TABLET | Freq: Once | ORAL | Status: AC
  Administered 2015-10-23: 40 meq via ORAL
  Filled 2015-10-23: qty 2

## 2015-10-23 MED ORDER — INSULIN STARTER KIT- SYRINGES (ENGLISH)
1.0000 | Freq: Once | Status: AC
  Administered 2015-10-23: 1
  Filled 2015-10-23: qty 1

## 2015-10-23 MED ORDER — DEXTROSE-NACL 5-0.45 % IV SOLN
INTRAVENOUS | Status: DC
  Administered 2015-10-23: 05:00:00 via INTRAVENOUS

## 2015-10-23 MED ORDER — LIVING WELL WITH DIABETES BOOK
Freq: Once | Status: AC
  Administered 2015-10-23: 10:00:00
  Filled 2015-10-23: qty 1

## 2015-10-23 MED ORDER — INSULIN ASPART 100 UNIT/ML ~~LOC~~ SOLN: 0.0000 [IU] | Freq: Every day | SUBCUTANEOUS | Status: DC

## 2015-10-23 MED ORDER — ENOXAPARIN SODIUM 40 MG/0.4ML ~~LOC~~ SOLN: 40.0000 mg | SUBCUTANEOUS | Status: DC

## 2015-10-23 MED ORDER — INSULIN DETEMIR 100 UNIT/ML ~~LOC~~ SOLN: 14.0000 [IU] | SUBCUTANEOUS | Status: DC

## 2015-10-23 NOTE — Progress Notes (Signed)
  RD consulted for nutrition education regarding diabetes.   No results found for: HGBA1C  RD provided "Carbohydrate Counting for People with Diabetes" handout from the Academy of Nutrition and Dietetics. Discussed different food groups and their effects on blood sugar, emphasizing carbohydrate-containing foods. Provided list of carbohydrates and recommended serving sizes of common foods.  Discussed importance of controlled and consistent carbohydrate intake throughout the day. Provided examples of ways to balance meals/snacks and encouraged intake of high-fiber, whole grain complex carbohydrates. Teach back method used.  Expect good compliance.  Body mass index is 27.43 kg/(m^2).   Current diet order is carb modified, patient is consuming approximately >50% of meals at this time. Labs and medications reviewed. No further nutrition interventions warranted at this time. RD contact information provided. If additional nutrition issues arise, please re-consult RD.  Jonathan Page B. Jonathan Page, [place], [place] (pager) Weekend/On-Call pager [phone])

## 2015-10-23 NOTE — Progress Notes (Signed)
Pt discharged per dr order. Reviewed discharge instructions with pt, also reviewed education materials provided to pt regarding diabetes, blood sugar checks, diet, medication and insulin administration. Pt verbalized understanding, demonstrated appropriate insulin administrating and correct teach back. All ICU lines and monitors removed with no complications. Pt clothes are the only belongings at the beside and were returned to the pt.

## 2015-10-23 NOTE — Progress Notes (Signed)
Inpatient Diabetes Program Recommendations  AACE/ADA: New Consensus Statement on Inpatient Glycemic Control (2015)  Target Ranges:  Prepandial:   less than 140 mg/dL      Peak postprandial:   less than 180 mg/dL (1-2 hours)      Critically ill patients:  140 - 180 mg/dL   Review of Glycemic Control  Results for Jonathan Page, Jonathan Page (MRN [id number]) as of 10/23/2015 09:52  Ref. Range 10/23/2015 03:46 10/23/2015 04:49 10/23/2015 05:38 10/23/2015 06:42 10/23/2015 07:29  Glucose-Capillary Latest Ref Range: 65-99 mg/dL 157 (H) 176 (H) 225 (H) 236 (H) 214 (H)   Diabetes history: Type 2, A1C pending Outpatient Diabetes medications: Metformin 500mg  bid Current orders for Inpatient glycemic control: Levemir 14 units q24hours (0.15units/kg),  Novolog 0-15 units tid with meals, Novolog 0-5 units qhs  Inpatient Diabetes Program Recommendations: Agree with current orders for blood sugar management. MD notes patient may have to go home on insulin- please begin insulin teaching and have patient inject his own insulin while he is inpatient.   Gentry Fitz, RN, BA, MHA, CDE Diabetes Coordinator Inpatient Diabetes Program  [phone] (Team Pager) [phone] ([place]) 10/23/2015 9:57 AM

## 2015-10-23 NOTE — Discharge Summary (Signed)
[facility] [place] NAME: Jonathan Page    MR#:  [id number]  DATE OF BIRTH:  [birth year]  DATE OF ADMISSION:  10/22/2015 ADMITTING PHYSICIAN: Nicholes Mango, MD  DATE OF DISCHARGE: 10/23/2015  PRIMARY CARE PHYSICIAN: Emeterio Reeve, MD    ADMISSION DIAGNOSIS:  Hyperkalemia [E87.5] Hyponatremia [E87.1] Hyperglycemia [R73.9] Renal insufficiency [N28.9]  DISCHARGE DIAGNOSIS:  Active Problems:   Type 2 diabetes mellitus with hyperosmolar nonketotic hyperglycemia ([place])   SECONDARY DIAGNOSIS:   Past Medical History  Diagnosis Date  . Diabetes ([place])   . Concussion syndrome   . Hypertension     HOSPITAL COURSE:   #hyperosmolar nonketotic hyperglycemia  admitted  to intensive care unit Provide aggressive hydration with IV fluids and on insulin drip BMP every 6 hours  TSH normal. Blood sugar came under control. Discharge with levemir at home He have appointment with PMD in 5 days.  #Acute renal insufficiency from dehydration from hyperglycemia Provide IV fluids and monitor renal function Renal func improved to creatinin 1.6. We don't have baseline.   Follow with PMD on regular bases.  #Hyperkalemia Continue aggressive hydration with IV fluids and repeat BMP.    Corrected.  #Essential hypertension Resume home meds, stable.  #Tobacco abuse disorder consultation to quit smoking for 3-5 minutes Will provide nicotine patch  DISCHARGE CONDITIONS:   Stable.  CONSULTS OBTAINED:  Treatment Team:  Judi Cong, MD  DRUG ALLERGIES:  No Known Allergies  DISCHARGE MEDICATIONS:   Current Discharge Medication List    START taking these medications   Details  insulin detemir (LEVEMIR) 100 UNIT/ML injection Inject 0.14 mLs (14 Units total) into the skin daily. Qty: 10 mL, Refills: 11      CONTINUE these medications which have NOT CHANGED   Details  aspirin EC 81 MG tablet Take 81 mg by mouth daily.     lisinopril-hydrochlorothiazide (PRINZIDE,ZESTORETIC) 20-25 MG tablet Take 1 tablet by mouth daily.    metFORMIN (GLUCOPHAGE) 500 MG tablet Take 1 tablet (500 mg total) by mouth 2 (two) times daily with a meal. Qty: 60 tablet, Refills: 0         DISCHARGE INSTRUCTIONS:    Follow with PMD in 1 week.  If you experience worsening of your admission symptoms, develop shortness of breath, life threatening emergency, suicidal or homicidal thoughts you must seek medical attention immediately by calling 911 or calling your MD immediately  if symptoms less severe.  You Must read complete instructions/literature along with all the possible adverse reactions/side effects for all the Medicines you take and that have been prescribed to you. Take any new Medicines after you have completely understood and accept all the possible adverse reactions/side effects.   Please note  You were cared for by a hospitalist during your hospital stay. If you have any questions about your discharge medications or the care you received while you were in the hospital after you are discharged, you can call the unit and asked to speak with the hospitalist on call if the hospitalist that took care of you is not available. Once you are discharged, your primary care physician will handle any further medical issues. Please note that NO REFILLS for any discharge medications will be authorized once you are discharged, as it is imperative that you return to your primary care physician (or establish a relationship with a primary care physician if you do not have one) for your aftercare needs so that they can reassess your  need for medications and monitor your lab values.    Today   CHIEF COMPLAINT:   Chief Complaint  Patient presents with  . Hyperglycemia    HISTORY OF PRESENT ILLNESS:  Jonathan Page  is a 65 y.o. male with a known history of Diabetes mellitus on metformin and essential hypertension is presenting to the ED  with a chief complaint of frequent urination and blurry vision associated with generalized weakness. Denies any chest pain or shortness of breath. Patient takes metformin for diabetes mellitus and was evaluated by the ED physician last week for the same. patient also is reporting being thirsty and frequent urination. In the ED patient's blood sugar is at 1095. Patient was given fluid boluses and insulin drip was initiated by the ED physician. Patient is resting comfortably during my examination. He reports that he is compliant with his medication   VITAL SIGNS:  Blood pressure 111/83, pulse 69, temperature 97.8 F (36.6 C), temperature source Oral, resp. rate 16, height 6\' 1"  (1.854 m), weight 94.3 kg (207 lb 14.3 oz), SpO2 98 %.  I/O:   Intake/Output Summary (Last 24 hours) at 10/23/15 1302 Last data filed at 10/23/15 1100  Gross per 24 hour  Intake 2143.45 ml  Output   1650 ml  Net 493.45 ml    PHYSICAL EXAMINATION:  GENERAL:  65 y.o.-year-old patient lying in the bed with no acute distress.  EYES: Pupils equal, round, reactive to light and accommodation. No scleral icterus. Extraocular muscles intact.  HEENT: Head atraumatic, normocephalic. Oropharynx and nasopharynx clear.  NECK:  Supple, no jugular venous distention. No thyroid enlargement, no tenderness.  LUNGS: Normal breath sounds bilaterally, no wheezing, rales,rhonchi or crepitation. No use of accessory muscles of respiration.  CARDIOVASCULAR: S1, S2 normal. No murmurs, rubs, or gallops.  ABDOMEN: Soft, non-tender, non-distended. Bowel sounds present. No organomegaly or mass.  EXTREMITIES: No pedal edema, cyanosis, or clubbing.  NEUROLOGIC: Cranial nerves II through XII are intact. Muscle strength 5/5 in all extremities. Sensation intact. Gait not checked.  PSYCHIATRIC: The patient is alert and oriented x 3.  SKIN: No obvious rash, lesion, or ulcer.   DATA REVIEW:   CBC  Recent Labs Lab 10/22/15 1645  WBC 6.5  HGB  14.3  HCT 43.7  PLT 150    Chemistries   Recent Labs Lab 10/23/15 0855  NA 134*  K 3.9  CL 104  CO2 23  GLUCOSE 262*  BUN 30*  CREATININE 1.68*  CALCIUM 8.7*    Cardiac Enzymes No results for input(s): TROPONINI in the last 168 hours.  Microbiology Results  Results for orders placed or performed during the hospital encounter of 10/22/15  MRSA PCR Screening     Status: None   Collection Time: 10/22/15  9:22 PM  Result Value Ref Range Status   MRSA by PCR NEGATIVE NEGATIVE Final    Comment:        The GeneXpert MRSA Assay (FDA approved for NASAL specimens only), is one component of a comprehensive MRSA colonization surveillance program. It is not intended to diagnose MRSA infection nor to guide or monitor treatment for MRSA infections.     RADIOLOGY:  No results found.  EKG:   Orders placed or performed during the hospital encounter of 10/22/15  . ED EKG  . ED EKG  . EKG 12-Lead  . EKG 12-Lead      Management plans discussed with the patient, family and they are in agreement.  CODE STATUS:  Code Status Orders        Start     Ordered   10/22/15 2112  Full code   Continuous     10/22/15 2111    Code Status History    Date Active Date Inactive Code Status Order ID Comments User Context   This patient has a current code status but no historical code status.      TOTAL TIME TAKING CARE OF THIS PATIENT: 35 minutes.    Vaughan Basta M.D on 10/23/2015 at 1:02 PM  Between 7am to 6pm - Pager - [phone]  After 6pm go to [email or web address] - password [email or web address] [place] [facility]  Office  [phone]  CC: Primary care physician; Emeterio Reeve, MD   Note: This dictation was prepared with Dragon dictation along with smaller phrase technology. Any transcriptional errors that result from this process are unintentional.

## 2015-10-23 NOTE — Progress Notes (Signed)
Inpatient Diabetes Program Recommendations  AACE/ADA: New Consensus Statement on Inpatient Glycemic Control (2015)  Target Ranges:  Prepandial:   less than 140 mg/dL      Peak postprandial:   less than 180 mg/dL (1-2 hours)      Critically ill patients:  140 - 180 mg/dL   Patient has used insulin in the past using a syringe (not the insulin pen). Used abdomen only. He tells me he used it for a month, over a year ago then went on Metformin.  He had a difficult time tolerating 1000mg  bid but is tolerating 500mg  bid.   Please have him teach back insulin administration and remind him that if he has any insulin still at his home- it should be discarded if it is open and discarded it if it is expired.  Jonathan Fitz, RN, BA, MHA, CDE Diabetes Coordinator Inpatient Diabetes Program  [phone] (Team Pager) [phone] ([place]) 10/23/2015 12:45 PM

## 2015-10-23 NOTE — Discharge Instructions (Signed)
Low carb diet, no Soda,  Check Blood sugar 3 times daily- follow regular with PMD.

## 2015-10-23 NOTE — Consult Note (Signed)
ENDOCRINOLOGY CONSULTATION  REFERRING PHYSICIAN:  Nicholes Mango, MD CONSULTING PHYSICIAN:  A. Lavone Orn, MD.  CHIEF COMPLAINT:  Diabetes mellitus  HISTORY OF PRESENT ILLNESS:  65 y.o. male is seen in consultation for uncontrolled diabetes / HHNK. Patient was admitted yesterday with severe hyperglycemia without DKA. He had complaints of several weeks of weakness, polyuria, inc'd thirst, and blurred vision. No nausea. Initial serum glucose was 1095. Also with acute on chronic renal insufficiency with creatinine 2.90, eGFR 25. He was given IVF and initial subQ, then IV insulin. Levemir 14 units given this morning at 5 AM and NovoLog 5 units given at breakfast and 15 units given at lunch. IV insulin stopped 2 hrs after Levemir was given. Lowest rate of IV insulin required was 0.8 units/hr. Sugars remain elevated in the range of 214 - 360. He is tolerating a diet. Blurred vision has improved.   He has a history of type 2 diabetes since at least 2012. He has consistently managed his diabetes. Recalls he was hospitalized in 2012 and started on insulin, but stopped insulin after about 4 weeks. In the last 6 months he was started on metformin. He was only able to tolerate 500 mg BID. He has not kept up regular out-patient follow up with a prior PCP at [facility] however plans to establish with another PCP locally, as he lives in [place].    He is a smoker. Not smoked in last 2 days. States he smokes 1/3 - 1/2 ppd.    PAST MEDICAL HISTORY:  Past Medical History  Diagnosis Date  . Diabetes ([place])   . Concussion syndrome   . Hypertension      CURRENT MEDICATIONS:  . aspirin EC  81 mg Oral Daily  . docusate sodium  100 mg Oral BID  . enoxaparin (LOVENOX) injection  40 mg Subcutaneous Q24H  . insulin aspart  0-15 Units Subcutaneous TID WC  . insulin aspart  0-5 Units Subcutaneous QHS  . insulin detemir  14 Units Subcutaneous Q24H  . metoprolol tartrate  12.5 mg Oral BID  . nicotine  14 mg  Transdermal Daily  . sodium chloride flush  3 mL Intravenous Q12H     SOCIAL HISTORY:  Social History  Substance Use Topics  . Smoking status: Current Every Day Smoker -- 0.5 ppd.    Types: Cigarettes  . Smokeless tobacco: None  . Alcohol Use: Yes     Comment: occasional beer, typically 3-5 per week.     FAMILY HISTORY:   Father had diabetes. Now deceased.   ALLERGIES:  No Known Allergies  REVIEW OF SYSTEMS:  GENERAL:  No weight loss.  No fever.  HEENT:  No blurred vision. No sore throat.  NECK:  No neck pain or dysphagia.  CARDIAC:  No chest pain or palpitation.  PULMONARY:  No cough or shortness of breath.  ABDOMEN:  No abdominal pain.  No constipation. EXTREMITIES:  No lower extremity swelling.  ENDOCRINE:  No heat or cold intolerance.  GENITOURINARY:  No dysuria or hematuria. SKIN:  No recent rash or skin changes.   PHYSICAL EXAMINATION:  BP 99/71 mmHg  Pulse 85  Temp(Src) 97.8 F (36.6 C) (Oral)  Resp 12  Ht '6\' 1"'  (1.854 m)  Wt 94.3 kg (207 lb 14.3 oz)  BMI 27.43 kg/m2  SpO2 97%  GENERAL:  Well-developed male in NAD. HEENT:  EOMI.  Oropharynx is clear.  NECK:  Supple.  No thyromegaly.  No neck tenderness.  CARDIAC:  Regular rate and  rhythm without murmur.  PULMONARY:  Clear to auscultation bilaterally. No wheeze.  ABDOMEN:  Diffusely soft, nontender, nondistended.  EXTREMITIES:  No peripheral edema is present.    SKIN:  No rash or dermatopathy. NEUROLOGIC:  No dysarthria.  No tremor. PSYCHIATRIC:  Alert and oriented, calm, cooperative.   LABORATORY DATA:  Results for orders placed or performed during the hospital encounter of 10/22/15 (from the past 24 hour(s))  Glucose, capillary     Status: Abnormal   Collection Time: 10/22/15  5:37 PM  Result Value Ref Range   Glucose-Capillary >600 (HH) 65 - 99 mg/dL  Glucose, capillary     Status: Abnormal   Collection Time: 10/22/15  6:31 PM  Result Value Ref Range   Glucose-Capillary >600 (HH) 65 - 99  mg/dL  Glucose, capillary     Status: Abnormal   Collection Time: 10/22/15  7:36 PM  Result Value Ref Range   Glucose-Capillary >600 (HH) 65 - 99 mg/dL  Glucose, capillary     Status: Abnormal   Collection Time: 10/22/15  8:42 PM  Result Value Ref Range   Glucose-Capillary 515 (H) 65 - 99 mg/dL  Glucose, capillary     Status: Abnormal   Collection Time: 10/22/15  9:10 PM  Result Value Ref Range   Glucose-Capillary 428 (H) 65 - 99 mg/dL  Basic metabolic panel     Status: Abnormal   Collection Time: 10/22/15  9:13 PM  Result Value Ref Range   Sodium 132 (L) 135 - 145 mmol/L   Potassium 3.5 3.5 - 5.1 mmol/L   Chloride 98 (L) 101 - 111 mmol/L   CO2 26 22 - 32 mmol/L   Glucose, Bld 451 (H) 65 - 99 mg/dL   BUN 44 (H) 6 - 20 mg/dL   Creatinine, Ser 2.30 (H) 0.61 - 1.24 mg/dL   Calcium 9.2 8.9 - 10.3 mg/dL   GFR calc non Af Amer 28 (L) >60 mL/min   GFR calc Af Amer 33 (L) >60 mL/min   Anion gap 8 5 - 15  MRSA PCR Screening     Status: None   Collection Time: 10/22/15  9:22 PM  Result Value Ref Range   MRSA by PCR NEGATIVE NEGATIVE  Glucose, capillary     Status: Abnormal   Collection Time: 10/22/15  9:32 PM  Result Value Ref Range   Glucose-Capillary 335 (H) 65 - 99 mg/dL  Glucose, capillary     Status: Abnormal   Collection Time: 10/22/15 10:41 PM  Result Value Ref Range   Glucose-Capillary 198 (H) 65 - 99 mg/dL  Glucose, capillary     Status: Abnormal   Collection Time: 10/22/15 11:42 PM  Result Value Ref Range   Glucose-Capillary 146 (H) 65 - 99 mg/dL  Glucose, capillary     Status: Abnormal   Collection Time: 10/23/15 12:45 AM  Result Value Ref Range   Glucose-Capillary 113 (H) 65 - 99 mg/dL  Glucose, capillary     Status: Abnormal   Collection Time: 10/23/15  1:44 AM  Result Value Ref Range   Glucose-Capillary 114 (H) 65 - 99 mg/dL  Glucose, capillary     Status: Abnormal   Collection Time: 10/23/15  2:43 AM  Result Value Ref Range   Glucose-Capillary 139 (H) 65 -  99 mg/dL  Basic metabolic panel     Status: Abnormal   Collection Time: 10/23/15  3:23 AM  Result Value Ref Range   Sodium 138 135 - 145 mmol/L   Potassium 3.4 (  L) 3.5 - 5.1 mmol/L   Chloride 105 101 - 111 mmol/L   CO2 25 22 - 32 mmol/L   Glucose, Bld 147 (H) 65 - 99 mg/dL   BUN 37 (H) 6 - 20 mg/dL   Creatinine, Ser 1.65 (H) 0.61 - 1.24 mg/dL   Calcium 8.9 8.9 - 10.3 mg/dL   GFR calc non Af Amer 42 (L) >60 mL/min   GFR calc Af Amer 49 (L) >60 mL/min   Anion gap 8 5 - 15  Hemoglobin A1c     Status: Abnormal   Collection Time: 10/23/15  3:23 AM  Result Value Ref Range   Hgb A1c MFr Bld 12.9 (H) 4.0 - 6.0 %  TSH     Status: None   Collection Time: 10/23/15  3:23 AM  Result Value Ref Range   TSH 2.195 0.350 - 4.500 uIU/mL  Glucose, capillary     Status: Abnormal   Collection Time: 10/23/15  3:46 AM  Result Value Ref Range   Glucose-Capillary 157 (H) 65 - 99 mg/dL  Glucose, capillary     Status: Abnormal   Collection Time: 10/23/15  4:49 AM  Result Value Ref Range   Glucose-Capillary 176 (H) 65 - 99 mg/dL  Glucose, capillary     Status: Abnormal   Collection Time: 10/23/15  5:38 AM  Result Value Ref Range   Glucose-Capillary 225 (H) 65 - 99 mg/dL  Glucose, capillary     Status: Abnormal   Collection Time: 10/23/15  6:42 AM  Result Value Ref Range   Glucose-Capillary 236 (H) 65 - 99 mg/dL  Glucose, capillary     Status: Abnormal   Collection Time: 10/23/15  7:29 AM  Result Value Ref Range   Glucose-Capillary 214 (H) 65 - 99 mg/dL  Basic metabolic panel     Status: Abnormal   Collection Time: 10/23/15  8:55 AM  Result Value Ref Range   Sodium 134 (L) 135 - 145 mmol/L   Potassium 3.9 3.5 - 5.1 mmol/L   Chloride 104 101 - 111 mmol/L   CO2 23 22 - 32 mmol/L   Glucose, Bld 262 (H) 65 - 99 mg/dL   BUN 30 (H) 6 - 20 mg/dL   Creatinine, Ser 1.68 (H) 0.61 - 1.24 mg/dL   Calcium 8.7 (L) 8.9 - 10.3 mg/dL   GFR calc non Af Amer 41 (L) >60 mL/min   GFR calc Af Amer 48 (L) >60  mL/min   Anion gap 7 5 - 15  Glucose, capillary     Status: Abnormal   Collection Time: 10/23/15 11:43 AM  Result Value Ref Range   Glucose-Capillary 360 (H) 65 - 99 mg/dL   Lab Results  Component Value Date   HGBA1C 12.9* 10/23/2015     ASSESSMENT:  1. HHNK, improved.  2.   Diabetes mellitus type 2, uncontrolled 3.   Tobacco dependence 4.   Chronic renal insufficiency / CKD stage 3  PLAN: 1. Counseled patient about his diabetes. Suggested it has been present and untreated / uncrecognized likely for 4+ years. 2.  Agree with subQ insulin. Current doses are reasonable for now. Needs teaching on self administration. 3.  He would benefit from regular BG monitoring, preferably qACHS 4.  Encouraged smoking cessation. 5.  Encouraged low carb, low salt diet.  Will arrange out-patient follow up in my clinic next week. Thank you for the kind request for consultation.

## 2015-10-23 NOTE — Progress Notes (Signed)
Order for enoxaparin 30 mg subcutaneously daily for DVT prophylaxis was changed to 40 mg daily per anticoagulation protocol for CrCl > 30 mL/min.  Lenis Noon, PharmD 7:44 AM

## 2015-10-27 ENCOUNTER — Ambulatory Visit (INDEPENDENT_AMBULATORY_CARE_PROVIDER_SITE_OTHER): Payer: Medicare HMO | Admitting: Primary Care

## 2015-10-27 ENCOUNTER — Other Ambulatory Visit: Payer: Self-pay | Admitting: Primary Care

## 2015-10-27 ENCOUNTER — Encounter: Payer: Self-pay | Admitting: Primary Care

## 2015-10-27 ENCOUNTER — Telehealth: Payer: Self-pay | Admitting: Radiology

## 2015-10-27 VITALS — BP 116/76 | HR 84 | Temp 97.9°F | Ht 72.5 in | Wt 206.0 lb

## 2015-10-27 DIAGNOSIS — E119 Type 2 diabetes mellitus without complications: Secondary | ICD-10-CM | POA: Diagnosis not present

## 2015-10-27 DIAGNOSIS — E785 Hyperlipidemia, unspecified: Secondary | ICD-10-CM

## 2015-10-27 DIAGNOSIS — E1101 Type 2 diabetes mellitus with hyperosmolarity with coma: Secondary | ICD-10-CM

## 2015-10-27 DIAGNOSIS — I10 Essential (primary) hypertension: Secondary | ICD-10-CM | POA: Diagnosis not present

## 2015-10-27 DIAGNOSIS — E1100 Type 2 diabetes mellitus with hyperosmolarity without nonketotic hyperglycemic-hyperosmolar coma (NKHHC): Secondary | ICD-10-CM

## 2015-10-27 LAB — BASIC METABOLIC PANEL
BUN: 29 mg/dL — AB (ref 6–23)
CALCIUM: 10.4 mg/dL (ref 8.4–10.5)
CO2: 29 mEq/L (ref 19–32)
Chloride: 94 mEq/L — ABNORMAL LOW (ref 96–112)
Creatinine, Ser: 2.03 mg/dL — ABNORMAL HIGH (ref 0.40–1.50)
GFR: 42.67 mL/min — AB (ref >60.00)
GLUCOSE: 502 mg/dL — AB (ref 70–99)
POTASSIUM: 4.5 meq/L (ref 3.5–5.1)
SODIUM: 128 meq/L — AB (ref 135–145)

## 2015-10-27 LAB — LIPID PANEL
Cholesterol: 194 mg/dL (ref 0–200)
HDL: 31.8 mg/dL — AB (ref >39.00)
NONHDL: 162.57
Total CHOL/HDL Ratio: 6
Triglycerides: 311 mg/dL — ABNORMAL HIGH (ref 0.0–149.0)
VLDL: 62.2 mg/dL — ABNORMAL HIGH (ref 0.0–40.0)

## 2015-10-27 LAB — MICROALBUMIN / CREATININE URINE RATIO
CREATININE, U: 83.8 mg/dL
MICROALB UR: 0.8 mg/dL (ref 0.0–1.9)
Microalb Creat Ratio: 1 mg/g (ref 0.0–30.0)

## 2015-10-27 LAB — LDL CHOLESTEROL, DIRECT: LDL DIRECT: 105 mg/dL

## 2015-10-27 MED ORDER — ATORVASTATIN CALCIUM 40 MG PO TABS: 40.0000 mg | ORAL_TABLET | Freq: Every day | ORAL | Status: DC

## 2015-10-27 MED ORDER — LANCETS MISC: Status: DC

## 2015-10-27 MED ORDER — GLUCOSE BLOOD VI STRP: ORAL_STRIP | Status: DC

## 2015-10-27 MED ORDER — INSULIN DETEMIR 100 UNIT/ML ~~LOC~~ SOLN: 20.0000 [IU] | SUBCUTANEOUS | Status: DC

## 2015-10-27 NOTE — Progress Notes (Signed)
Pre visit review using our clinic review tool, if applicable. No additional management support is needed unless otherwise documented below in the visit note. 

## 2015-10-27 NOTE — Telephone Encounter (Signed)
[facility] called critical results, Glucose - 502. Results given to Allie Bossier, NP

## 2015-10-27 NOTE — Assessment & Plan Note (Signed)
Diagnosed in 2012, no treatment since recent hospitalization with glucose reading of 1095. Currently on Levemir 14. Will continue to hold Metformin until BMP returns with current kidney function. Will likely discontinue. Discussed when to check sugars and that 30 minutes after a meal was not accurate.  Foot exam completed today.  Pneumonia vaccination UTD, due in December 2017. Managed on ACE. Repeat lipids today, will likely require statin. Urine microalbumin pending.  Increase Levemir to 20 units daily. Will repeat A1C in 3 months, will also call patient in several weeks with updated sugar logs. Parameters provided regarding highs and lows. Refill of strips and insulin provided today.  Follow up in 3 months for re-evaluation.

## 2015-10-27 NOTE — Telephone Encounter (Signed)
Noted  

## 2015-10-27 NOTE — Progress Notes (Signed)
Subjective:    Patient ID: Jonathan Page, male    DOB: [birth year], 65 y.o.   MRN: [id number]  HPI  Jonathan Page is a 65 year old male who presents today to establish care and discuss the problems mentioned below. Will review old records.  1) Type 2 Diabetes: Diagnosed in 2012. He's not had regular follow up and management for his diabetes in sevearl years. Once managed on insulin and Metformin the past, but stopped taking after his diabetes "dissapeared". Currently managed on Levemir 14 units daily (initated during recent hospitalization) and Metformin 500 mg twice daily. Recent hospitalization in May 2017 for HHNK. Records from Care [facility] show pneumovax 23 provided on 03/2014. He has an appointment for a diabetic eye exam next week. He is managed on an ACE.  2) Essential Hypertension: Diagnosed in 2012. Currently managed on lisinopril-HCTZ 20/25 mg. Denies chest pain, headaches, shortness of breath.   3) Hospital Follow Up: Presented to [facility] ED on 10/22/15 with complaints of polyuria, polydipsia, blurry vision, and generalized weakness. He was evaluated at [facility] on 05/07 for same complaints with blood sugar running 642. During his recent stay in the ED his glucose level was 1095. He was admitted for [facility] and acute renal failure with initial creatinine of 2.9.   During his hospitalization he was treated with aggressive IV fluids, IV insulin. His A1C was 12.9. His sugars reduced with aggressive treatment and his creatinine improved with rehydration to 1.6.  He was evaluated by the diabetes coordinator who recommended Levemir 14 units daily in addition to his Metformin, and a nutritionist who provided educaiton regarding proper diabetic diet. He was also evaluated by endocrinology who agreed with the addition of Levemir and agreed to follow patient in the outpatient setting. He was discharged home on 05/12 with recommendations for follow up with PCP and endocrinology.  Since his discharge he's  checking his sugars three times daily 30 minutes after each meal. His readings are in the 500's. He's cut back on his fried foods. He did increase his Levemir to 20 units once with sugars decreasing to 320. His polyuria and polydipsia have resolved since. Denies weakness, lethargy, blurred vision, numbness/tingling.   Review of Systems  Eyes: Negative for visual disturbance.  Respiratory: Negative for shortness of breath.   Cardiovascular: Negative for chest pain.  Gastrointestinal: Negative for diarrhea.  Endocrine: Negative for polydipsia and polyuria.  Neurological: Negative for dizziness, numbness and headaches.       Past Medical History  Diagnosis Date  . Diabetes ([place])   . Concussion syndrome   . Hypertension   . Frequent headaches      Social History   Social History  . Marital Status: Single    Spouse Name: N/A  . Number of Children: N/A  . Years of Education: N/A   Occupational History  . Not on file.   Social History Main Topics  . Smoking status: Current Every Day Smoker -- 3.00 packs/day    Types: Cigarettes  . Smokeless tobacco: Not on file  . Alcohol Use: Yes     Comment: occasional beer  . Drug Use: No  . Sexual Activity: Not on file   Other Topics Concern  . Not on file   Social History Narrative   Divorced.   2 children, 1 grandchild.   Retired. Once worked as a Catering manager.    Enjoys relaxing.     Past Surgical History  Procedure Laterality Date  . Appendectomy  1960  Family History  Problem Relation Age of Onset  . Diabetes Father   . Diabetes Paternal Grandfather   . Diabetes Paternal Grandmother     No Known Allergies  Current Outpatient Prescriptions on File Prior to Visit  Medication Sig Dispense Refill  . aspirin EC 81 MG tablet Take 81 mg by mouth daily.    Marland Kitchen lisinopril-hydrochlorothiazide (PRINZIDE,ZESTORETIC) 20-25 MG tablet Take 1 tablet by mouth daily.    . metFORMIN (GLUCOPHAGE) 500 MG tablet Take 1 tablet (500  mg total) by mouth 2 (two) times daily with a meal. 60 tablet 0   No current facility-administered medications on file prior to visit.    BP 116/76 mmHg  Pulse 84  Temp(Src) 97.9 F (36.6 C) (Oral)  Ht 6' 0.5" (1.842 m)  Wt 206 lb (93.441 kg)  BMI 27.54 kg/m2  SpO2 98%     Objective:   Physical Exam  Constitutional: He is oriented to person, place, and time. He appears well-nourished.  Neck: Neck supple.  Cardiovascular: Normal rate and regular rhythm.   Pulmonary/Chest: Effort normal and breath sounds normal. He has no wheezes. He has no rales.  Neurological: He is alert and oriented to person, place, and time.  Skin: Skin is warm and dry.  Psychiatric: He has a normal mood and affect.          Assessment & Plan:  Hospital Follow Up:  Recent hospitalization with HHNK secondary to uncontrolled type 2 diabetes.  Sugars running in 500's since discharge, although he's checking 30 minutes after meals. Discussed appropriate time to check sugars. Will increase Levemir to 20 units daily as they are still too high. Lipds, urine microalbumin, and repeat BMP pending today. Hold Metformin. Pneumonia vaccination UTD. Managed on Ace. Foot exam unremarkable today. Long discussion on importance of diet.  Follow up in 3 months. Call in 3 weeks for sugar logs.  If no improvement in repeat A1C in 3 months, will send to endocrinology for further evaluation.

## 2015-10-27 NOTE — Assessment & Plan Note (Signed)
Diagnosed years ago, managed on lisinopril-hctz. BP stable today. Continue current regimen.

## 2015-10-27 NOTE — Patient Instructions (Signed)
You will be due for a pneumonia vaccination in December 2017.  Complete lab work prior to leaving today. I will notify you of your results once received.   Start checking your sugars either before meals or 2 hours after eating.  Increase your Levemir to 20 units every day. Do not take your Metformin for now.  Please notify me if sugars remain above 200 or drop below 70. I will call to check on you in a few weeks.  Follow up in 3 months for re-evaluation.   It was a pleasure to meet you today! Please don't hesitate to call me with any questions. Welcome to [facility]!  Type 2 Diabetes Mellitus, Adult Type 2 diabetes mellitus, often simply referred to as type 2 diabetes, is a long-lasting (chronic) disease. In type 2 diabetes, the pancreas does not make enough insulin (a hormone), the cells are less responsive to the insulin that is made (insulin resistance), or both. Normally, insulin moves sugars from food into the tissue cells. The tissue cells use the sugars for energy. The lack of insulin or the lack of normal response to insulin causes excess sugars to build up in the blood instead of going into the tissue cells. As a result, high blood sugar (hyperglycemia) develops. The effect of high sugar (glucose) levels can cause many complications. Type 2 diabetes was also previously called adult-onset diabetes, but it can occur at any age.  RISK FACTORS  A person is predisposed to developing type 2 diabetes if someone in the family has the disease and also has one or more of the following primary risk factors:  Weight gain, or being overweight or obese.  An inactive lifestyle.  A history of consistently eating high-calorie foods. Maintaining a normal weight and regular physical activity can reduce the chance of developing type 2 diabetes. SYMPTOMS  A person with type 2 diabetes may not show symptoms initially. The symptoms of type 2 diabetes appear slowly. The symptoms include:  Increased  thirst (polydipsia).  Increased urination (polyuria).  Increased urination during the night (nocturia).  Sudden or unexplained weight changes.  Frequent, recurring infections.  Tiredness (fatigue).  Weakness.  Vision changes, such as blurred vision.  Fruity smell to your breath.  Abdominal pain.  Nausea or vomiting.  Cuts or bruises which are slow to heal.  Tingling or numbness in the hands or feet.  An open skin wound (ulcer). DIAGNOSIS Type 2 diabetes is frequently not diagnosed until complications of diabetes are present. Type 2 diabetes is diagnosed when symptoms or complications are present and when blood glucose levels are increased. Your blood glucose level may be checked by one or more of the following blood tests:  A fasting blood glucose test. You will not be allowed to eat for at least 8 hours before a blood sample is taken.  A random blood glucose test. Your blood glucose is checked at any time of the day regardless of when you ate.  A hemoglobin A1c blood glucose test. A hemoglobin A1c test provides information about blood glucose control over the previous 3 months.  An oral glucose tolerance test (OGTT). Your blood glucose is measured after you have not eaten (fasted) for 2 hours and then after you drink a glucose-containing beverage. TREATMENT   You may need to take insulin or diabetes medicine daily to keep blood glucose levels in the desired range.  If you use insulin, you may need to adjust the dosage depending on the carbohydrates that you eat with  each meal or snack.  Lifestyle changes are recommended as part of your treatment. These may include:  Following an individualized diet plan developed by a nutritionist or dietitian.  Exercising daily. Your health care providers will set individualized treatment goals for you based on your age, your medicines, how long you have had diabetes, and any other medical conditions you have. Generally, the goal of  treatment is to maintain the following blood glucose levels:  Before meals (preprandial): 80-130 mg/dL.  After meals (postprandial): below 180 mg/dL.  A1c: less than 6.5-7%. HOME CARE INSTRUCTIONS   Have your hemoglobin A1c level checked twice a year.  Perform daily blood glucose monitoring as directed by your health care provider.  Monitor urine ketones when you are ill and as directed by your health care provider.  Take your diabetes medicine or insulin as directed by your health care provider to maintain your blood glucose levels in the desired range.  Never run out of diabetes medicine or insulin. It is needed every day.  If you are using insulin, you may need to adjust the amount of insulin given based on your intake of carbohydrates. Carbohydrates can raise blood glucose levels but need to be included in your diet. Carbohydrates provide vitamins, minerals, and fiber which are an essential part of a healthy diet. Carbohydrates are found in fruits, vegetables, whole grains, dairy products, legumes, and foods containing added sugars.  Eat healthy foods. You should make an appointment to see a registered dietitian to help you create an eating plan that is right for you.  Lose weight if you are overweight.  Carry a medical alert card or wear your medical alert jewelry.  Carry a 15-gram carbohydrate snack with you at all times to treat low blood glucose (hypoglycemia). Some examples of 15-gram carbohydrate snacks include:  Glucose tablets, 3 or 4.  Glucose gel, 15-gram tube.  Raisins, 2 tablespoons (24 grams).  Jelly beans, 6.  Animal crackers, 8.  Regular pop, 4 ounces (120 mL).  Gummy treats, 9.  Recognize hypoglycemia. Hypoglycemia occurs with blood glucose levels of 70 mg/dL and below. The risk for hypoglycemia increases when fasting or skipping meals, during or after intense exercise, and during sleep. Hypoglycemia symptoms can include:  Tremors or  shakes.  Decreased ability to concentrate.  Sweating.  Increased heart rate.  Headache.  Dry mouth.  Hunger.  Irritability.  Anxiety.  Restless sleep.  Altered speech or coordination.  Confusion.  Treat hypoglycemia promptly. If you are alert and able to safely swallow, follow the 15:15 rule:  Take 15-20 grams of rapid-acting glucose or carbohydrate. Rapid-acting options include glucose gel, glucose tablets, or 4 ounces (120 mL) of fruit juice, regular soda, or low-fat milk.  Check your blood glucose level 15 minutes after taking the glucose.  Take 15-20 grams more of glucose if the repeat blood glucose level is still 70 mg/dL or below.  Eat a meal or snack within 1 hour once blood glucose levels return to normal.  Be alert to feeling very thirsty and urinating more frequently than usual, which are early signs of hyperglycemia. An early awareness of hyperglycemia allows for prompt treatment. Treat hyperglycemia as directed by your health care provider.  Engage in at least 150 minutes of moderate-intensity physical activity a week, spread over at least 3 days of the week or as directed by your health care provider. In addition, you should engage in resistance exercise at least 2 times a week or as directed by your health  care provider. Try to spend no more than 90 minutes at one time inactive.  Adjust your medicine and food intake as needed if you start a new exercise or sport.  Follow your sick-day plan anytime you are unable to eat or drink as usual.  Do not use any tobacco products including cigarettes, chewing tobacco, or electronic cigarettes. If you need help quitting, ask your health care provider.  Limit alcohol intake to no more than 1 drink per day for nonpregnant women and 2 drinks per day for men. You should drink alcohol only when you are also eating food. Talk with your health care provider whether alcohol is safe for you. Tell your health care provider if you  drink alcohol several times a week.  Keep all follow-up visits as directed by your health care provider. This is important.  Schedule an eye exam soon after the diagnosis of type 2 diabetes and then annually.  Perform daily skin and foot care. Examine your skin and feet daily for cuts, bruises, redness, nail problems, bleeding, blisters, or sores. A foot exam by a health care provider should be done annually.  Brush your teeth and gums at least twice a day and floss at least once a day. Follow up with your dentist regularly.  Share your diabetes management plan with your workplace or school.  Keep your immunizations up to date. It is recommended that you receive a flu (influenza) vaccine every year. It is also recommended that you receive a pneumonia (pneumococcal) vaccine. If you are 65 years of age or older and have never received a pneumonia vaccine, this vaccine may be given as a series of two separate shots. Ask your health care provider which additional vaccines may be recommended.  Learn to manage stress.  Obtain ongoing diabetes education and support as needed.  Participate in or seek rehabilitation as needed to maintain or improve independence and quality of life. Request a physical or occupational therapy referral if you are having foot or hand numbness, or difficulties with grooming, dressing, eating, or physical activity. SEEK MEDICAL CARE IF:   You are unable to eat food or drink fluids for more than 6 hours.  You have nausea and vomiting for more than 6 hours.  Your blood glucose level is over 240 mg/dL.  There is a change in mental status.  You develop an additional serious illness.  You have diarrhea for more than 6 hours.  You have been sick or have had a fever for a couple of days and are not getting better.  You have pain during any physical activity.  SEEK IMMEDIATE MEDICAL CARE IF:  You have difficulty breathing.  You have moderate to large ketone  levels.   This information is not intended to replace advice given to you by your health care provider. Make sure you discuss any questions you have with your health care provider.   Document Released: 05/30/2005 Document Revised: 02/18/2015 Document Reviewed: 12/27/2011 Elsevier Interactive Patient Education 2016 [employer].  Diabetes Mellitus and Food It is important for you to manage your blood sugar (glucose) level. Your blood glucose level can be greatly affected by what you eat. Eating healthier foods in the appropriate amounts throughout the day at about the same time each day will help you control your blood glucose level. It can also help slow or prevent worsening of your diabetes mellitus. Healthy eating may even help you improve the level of your blood pressure and reach or maintain a healthy  weight.  General recommendations for healthful eating and cooking habits include:  Eating meals and snacks regularly. Avoid going long periods of time without eating to lose weight.  Eating a diet that consists mainly of plant-based foods, such as fruits, vegetables, nuts, legumes, and whole grains.  Using low-heat cooking methods, such as baking, instead of high-heat cooking methods, such as deep frying. Work with your dietitian to make sure you understand how to use the Nutrition Facts information on food labels. HOW CAN FOOD AFFECT ME? Carbohydrates Carbohydrates affect your blood glucose level more than any other type of food. Your dietitian will help you determine how many carbohydrates to eat at each meal and teach you how to count carbohydrates. Counting carbohydrates is important to keep your blood glucose at a healthy level, especially if you are using insulin or taking certain medicines for diabetes mellitus. Alcohol Alcohol can cause sudden decreases in blood glucose (hypoglycemia), especially if you use insulin or take certain medicines for diabetes mellitus. Hypoglycemia can be a  life-threatening condition. Symptoms of hypoglycemia (sleepiness, dizziness, and disorientation) are similar to symptoms of having too much alcohol.  If your health care provider has given you approval to drink alcohol, do so in moderation and use the following guidelines:  Women should not have more than one drink per day, and men should not have more than two drinks per day. One drink is equal to:  12 oz of beer.  5 oz of wine.  1 oz of hard liquor.  Do not drink on an empty stomach.  Keep yourself hydrated. Have water, diet soda, or unsweetened iced tea.  Regular soda, juice, and other mixers might contain a lot of carbohydrates and should be counted. WHAT FOODS ARE NOT RECOMMENDED? As you make food choices, it is important to remember that all foods are not the same. Some foods have fewer nutrients per serving than other foods, even though they might have the same number of calories or carbohydrates. It is difficult to get your body what it needs when you eat foods with fewer nutrients. Examples of foods that you should avoid that are high in calories and carbohydrates but low in nutrients include:  Trans fats (most processed foods list trans fats on the Nutrition Facts label).  Regular soda.  Juice.  Candy.  Sweets, such as cake, pie, doughnuts, and cookies.  Fried foods. WHAT FOODS CAN I EAT? Eat nutrient-rich foods, which will nourish your body and keep you healthy. The food you should eat also will depend on several factors, including:  The calories you need.  The medicines you take.  Your weight.  Your blood glucose level.  Your blood pressure level.  Your cholesterol level. You should eat a variety of foods, including:  Protein.  Lean cuts of meat.  Proteins low in saturated fats, such as fish, egg whites, and beans. Avoid processed meats.  Fruits and vegetables.  Fruits and vegetables that may help control blood glucose levels, such as apples,  mangoes, and yams.  Dairy products.  Choose fat-free or low-fat dairy products, such as milk, yogurt, and cheese.  Grains, bread, pasta, and rice.  Choose whole grain products, such as multigrain bread, whole oats, and brown rice. These foods may help control blood pressure.  Fats.  Foods containing healthful fats, such as nuts, avocado, olive oil, canola oil, and fish. DOES EVERYONE WITH DIABETES MELLITUS HAVE THE SAME MEAL PLAN? Because every person with diabetes mellitus is different, there is not one  meal plan that works for everyone. It is very important that you meet with a dietitian who will help you create a meal plan that is just right for you.   This information is not intended to replace advice given to you by your health care provider. Make sure you discuss any questions you have with your health care provider.   Document Released: 02/24/2005 Document Revised: 06/20/2014 Document Reviewed: 04/26/2013 Elsevier Interactive Patient Education [employer].

## 2015-10-28 ENCOUNTER — Other Ambulatory Visit (INDEPENDENT_AMBULATORY_CARE_PROVIDER_SITE_OTHER): Payer: Medicare HMO

## 2015-10-28 ENCOUNTER — Other Ambulatory Visit: Payer: Self-pay | Admitting: Primary Care

## 2015-10-28 DIAGNOSIS — E119 Type 2 diabetes mellitus without complications: Secondary | ICD-10-CM

## 2015-10-28 LAB — HEPATIC FUNCTION PANEL
ALK PHOS: 71 U/L (ref 39–117)
ALT: 25 U/L (ref 0–53)
AST: 21 U/L (ref 0–37)
Albumin: 4.7 g/dL (ref 3.5–5.2)
BILIRUBIN DIRECT: 0.1 mg/dL (ref 0.0–0.3)
BILIRUBIN TOTAL: 0.4 mg/dL (ref 0.2–1.2)
Total Protein: 7.7 g/dL (ref 6.0–8.3)

## 2015-10-28 MED ORDER — ACCU-CHEK AVIVA DEVI: Status: DC

## 2015-10-28 MED ORDER — GLUCOSE BLOOD VI STRP: ORAL_STRIP | Status: DC

## 2015-10-28 MED ORDER — ACCU-CHEK SOFT TOUCH LANCETS MISC: Status: DC

## 2015-10-28 NOTE — Telephone Encounter (Signed)
Received fax from [facility]. Need to send preferred product for [facility] plans  Sent Accu-Chek meter, lancets, and test strips as request from [facility].

## 2015-10-30 ENCOUNTER — Telehealth: Payer: Self-pay | Admitting: Primary Care

## 2015-10-30 NOTE — Telephone Encounter (Signed)
-----   Message from [name], NP sent at 10/27/2015  4:53 PM EDT ----- Regarding: Blood Sugars Please check on Jonathan Page's sugars. 1. Is he checking before meals or 2 hours after? 2. What are his readings? 3. Anything below 80 or above 400?

## 2015-10-30 NOTE — Telephone Encounter (Signed)
Tried to call patient but could not leave message due to voicemail box is full

## 2015-11-02 NOTE — Telephone Encounter (Signed)
Tried to call patient but could not leave message due to voicemail box is full.

## 2015-11-03 ENCOUNTER — Encounter: Payer: Self-pay | Admitting: Internal Medicine

## 2015-11-03 ENCOUNTER — Other Ambulatory Visit (INDEPENDENT_AMBULATORY_CARE_PROVIDER_SITE_OTHER): Payer: Medicare HMO

## 2015-11-03 ENCOUNTER — Ambulatory Visit (INDEPENDENT_AMBULATORY_CARE_PROVIDER_SITE_OTHER): Payer: Medicare HMO | Admitting: Internal Medicine

## 2015-11-03 ENCOUNTER — Telehealth: Payer: Self-pay | Admitting: Radiology

## 2015-11-03 ENCOUNTER — Emergency Department
Admission: EM | Admit: 2015-11-03 | Discharge: 2015-11-03 | Disposition: A | Discharge status: Home / Self Care | Payer: Medicare HMO | Attending: Emergency Medicine | Admitting: Emergency Medicine

## 2015-11-03 VITALS — BP 112/60 | HR 91 | Temp 97.7°F | Wt 202.5 lb

## 2015-11-03 DIAGNOSIS — R899 Unspecified abnormal finding in specimens from other organs, systems and tissues: Secondary | ICD-10-CM

## 2015-11-03 DIAGNOSIS — E1100 Type 2 diabetes mellitus with hyperosmolarity without nonketotic hyperglycemic-hyperosmolar coma (NKHHC): Secondary | ICD-10-CM | POA: Insufficient documentation

## 2015-11-03 DIAGNOSIS — F17210 Nicotine dependence, cigarettes, uncomplicated: Secondary | ICD-10-CM | POA: Insufficient documentation

## 2015-11-03 DIAGNOSIS — Z794 Long term (current) use of insulin: Secondary | ICD-10-CM

## 2015-11-03 DIAGNOSIS — E1165 Type 2 diabetes mellitus with hyperglycemia: Secondary | ICD-10-CM | POA: Diagnosis not present

## 2015-11-03 DIAGNOSIS — Z7982 Long term (current) use of aspirin: Secondary | ICD-10-CM | POA: Diagnosis not present

## 2015-11-03 DIAGNOSIS — I10 Essential (primary) hypertension: Secondary | ICD-10-CM | POA: Diagnosis not present

## 2015-11-03 DIAGNOSIS — Z7984 Long term (current) use of oral hypoglycemic drugs: Secondary | ICD-10-CM | POA: Insufficient documentation

## 2015-11-03 DIAGNOSIS — R739 Hyperglycemia, unspecified: Secondary | ICD-10-CM

## 2015-11-03 LAB — URINALYSIS COMPLETE WITH MICROSCOPIC (ARMC ONLY)
BACTERIA UA: NONE SEEN
Bilirubin Urine: NEGATIVE
Hgb urine dipstick: NEGATIVE
Ketones, ur: NEGATIVE mg/dL
Leukocytes, UA: NEGATIVE
Nitrite: NEGATIVE
PROTEIN: NEGATIVE mg/dL
RBC / HPF: NONE SEEN RBC/hpf (ref 0–5)
SPECIFIC GRAVITY, URINE: 1.02 (ref 1.005–1.030)
pH: 6 (ref 5.0–8.0)

## 2015-11-03 LAB — BASIC METABOLIC PANEL
ANION GAP: 10 (ref 5–15)
BUN: 41 mg/dL — ABNORMAL HIGH (ref 6–23)
BUN: 47 mg/dL — ABNORMAL HIGH (ref 6–20)
CHLORIDE: 92 meq/L — AB (ref 96–112)
CHLORIDE: 91 mmol/L — AB (ref 101–111)
CO2: 30 meq/L (ref 19–32)
CO2: 25 mmol/L (ref 22–32)
Calcium: 10 mg/dL (ref 8.4–10.5)
Calcium: 9.2 mg/dL (ref 8.9–10.3)
Creatinine, Ser: 2.71 mg/dL — ABNORMAL HIGH (ref 0.40–1.50)
Creatinine, Ser: 2.81 mg/dL — ABNORMAL HIGH (ref 0.61–1.24)
GFR calc non Af Amer: 22 mL/min — ABNORMAL LOW (ref >60)
GFR, EST AFRICAN AMERICAN: 26 mL/min — AB (ref >60)
GFR: 30.57 mL/min — ABNORMAL LOW (ref >60.00)
GLUCOSE: 633 mg/dL — AB (ref 70–99)
GLUCOSE: 551 mg/dL — AB (ref 65–99)
POTASSIUM: 4.8 meq/L (ref 3.5–5.1)
POTASSIUM: 4.8 mmol/L (ref 3.5–5.1)
SODIUM: 128 meq/L — AB (ref 135–145)
Sodium: 126 mmol/L — ABNORMAL LOW (ref 135–145)

## 2015-11-03 LAB — CBC
HEMATOCRIT: 42.4 % (ref 40.0–52.0)
HEMOGLOBIN: 14.3 g/dL (ref 13.0–18.0)
MCH: 30.8 pg (ref 26.0–34.0)
MCHC: 33.8 g/dL (ref 32.0–36.0)
MCV: 91.3 fL (ref 80.0–100.0)
Platelets: 182 10*3/uL (ref 150–440)
RBC: 4.64 MIL/uL (ref 4.40–5.90)
RDW: 13.5 % (ref 11.5–14.5)
WBC: 7 10*3/uL (ref 3.8–10.6)

## 2015-11-03 LAB — GLUCOSE, CAPILLARY
GLUCOSE-CAPILLARY: 557 mg/dL — AB (ref 65–99)
Glucose-Capillary: 323 mg/dL — ABNORMAL HIGH (ref 65–99)
Glucose-Capillary: 185 mg/dL — ABNORMAL HIGH (ref 65–99)

## 2015-11-03 MED ORDER — INSULIN ASPART 100 UNIT/ML ~~LOC~~ SOLN
4.0000 [IU] | Freq: Once | SUBCUTANEOUS | Status: AC
  Administered 2015-11-03: 4 [IU] via SUBCUTANEOUS

## 2015-11-03 MED ORDER — SODIUM CHLORIDE 0.9 % IV BOLUS (SEPSIS)
1000.0000 mL | Freq: Once | INTRAVENOUS | Status: AC
  Administered 2015-11-03: 1000 mL via INTRAVENOUS

## 2015-11-03 MED ORDER — GLIPIZIDE 10 MG PO TABS: 10.0000 mg | ORAL_TABLET | Freq: Two times a day (BID) | ORAL | Status: DC

## 2015-11-03 MED ORDER — INSULIN ASPART 100 UNIT/ML ~~LOC~~ SOLN
4.0000 [IU] | Freq: Once | SUBCUTANEOUS | Status: DC
  Filled 2015-11-03: qty 4

## 2015-11-03 MED ORDER — INSULIN ASPART 100 UNIT/ML ~~LOC~~ SOLN
10.0000 [IU] | Freq: Once | SUBCUTANEOUS | Status: AC
  Administered 2015-11-03: 10 [IU] via INTRAVENOUS
  Filled 2015-11-03: qty 10

## 2015-11-03 NOTE — Telephone Encounter (Signed)
Noted. With hyperglycemia and creatinine levels, he needs ED evaluation for IV insulin and fluids. Left message for patient to call me back.

## 2015-11-03 NOTE — Patient Instructions (Signed)

## 2015-11-03 NOTE — Telephone Encounter (Signed)
Patient was seen with Webb Silversmith on 11/03/2015.  Will call on 11/06/2015 and ask regarding blood sugar.

## 2015-11-03 NOTE — Telephone Encounter (Signed)
[facility] called a critical result, Glucose - [phone]. Results given to Allie Bossier, NP

## 2015-11-03 NOTE — ED Provider Notes (Signed)
[facility] Emergency Department Provider Note  Time seen: 4:19 PM  I have reviewed the triage vital signs and the nursing notes.   HISTORY  Chief Complaint Hyperglycemia    HPI Jonathan Page is a 65 y.o. male with a past medical history of diabetes, hypertension, presents the emergency department with an elevated blood sugar. According to the patient he was seen in the emergency department 1 week ago for a very high blood sugar. He has been followed by his primary care doctor since discharge, and they have been adjusting his medications accordingly. Patient states he went to his primary care doctor today and his blood sugar was 631 so they sent him to the emergency departmentfor further workup. Patient states his primary care doctor adjusted his insulin dosing today and called him in a new oral medication, but he has not had a chance to pick it up yet as he came straight to the emergency department. Patient denies any chest pain, abdominal pain, nausea, vomiting, diarrhea. Denies any urinary frequency.     Past Medical History  Diagnosis Date  . Diabetes ([place])   . Concussion syndrome   . Hypertension   . Frequent headaches     Patient Active Problem List   Diagnosis Date Noted  . Essential hypertension 10/27/2015  . Type 2 diabetes mellitus with hyperosmolar nonketotic hyperglycemia ([place]) 10/22/2015    Past Surgical History  Procedure Laterality Date  . Appendectomy  1960    Current Outpatient Rx  Name  Route  Sig  Dispense  Refill  . aspirin EC 81 MG tablet   Oral   Take 81 mg by mouth daily.         Marland Kitchen atorvastatin (LIPITOR) 40 MG tablet   Oral   Take 1 tablet (40 mg total) by mouth daily.   30 tablet   2   . Blood Glucose Monitoring Suppl (ACCU-CHEK AVIVA) device      Use to test blood sugar 3 times  daily.   1 each   0     Dx is E11.9   . glipiZIDE (GLUCOTROL) 10 MG tablet   Oral   Take 1 tablet (10 mg total) by mouth 2 (two)  times daily before a meal.   60 tablet   2   . glucose blood (ACCU-CHEK AVIVA) test strip      Use to test blood sugar 3 times daily.   100 each   11     Dx is E11.9   . insulin detemir (LEVEMIR) 100 UNIT/ML injection   Subcutaneous   Inject 0.2 mLs (20 Units total) into the skin daily.   10 mL   11   . Lancets (ACCU-CHEK SOFT TOUCH) lancets      Use to test blood sugar 3 times daily   100 each   11     Dx is E11.9   . lisinopril-hydrochlorothiazide (PRINZIDE,ZESTORETIC) 20-25 MG tablet   Oral   Take 1 tablet by mouth daily.         . metFORMIN (GLUCOPHAGE) 500 MG tablet   Oral   Take 1 tablet (500 mg total) by mouth 2 (two) times daily with a meal.   60 tablet   0     Allergies Review of patient's allergies indicates no known allergies.  Family History  Problem Relation Age of Onset  . Diabetes Father   . Diabetes Paternal Grandfather   . Diabetes Paternal Grandmother     Social  History Social History  Substance Use Topics  . Smoking status: Current Every Day Smoker -- 3.00 packs/day    Types: Cigarettes  . Smokeless tobacco: None  . Alcohol Use: 0.0 oz/week    0 Standard drinks or equivalent per week     Comment: occasional beer    Review of Systems Constitutional: Negative for fever. Cardiovascular: Negative for chest pain. Respiratory: Negative for shortness of breath. Gastrointestinal: Negative for abdominal pain, vomiting and diarrhea. Genitourinary: Negative for dysuria.Negative for frequency. Neurological: Negative for headache 10-point ROS otherwise negative.  ____________________________________________   PHYSICAL EXAM:  VITAL SIGNS: ED Triage Vitals  Enc Vitals Group     BP 11/03/15 1447 93/60 mmHg     Pulse Rate 11/03/15 1447 88     Resp 11/03/15 1447 18     Temp 11/03/15 1447 98.4 F (36.9 C)     Temp Source 11/03/15 1447 Oral     SpO2 11/03/15 1447 97 %     Weight --      Height 11/03/15 1447 6\' 1"  (1.854 m)     Head  Cir --      Peak Flow --      Pain Score 11/03/15 1448 0     Pain Loc --      Pain Edu? --      Excl. in [place]? --     Constitutional: Alert and oriented. Well appearing and in no distress. Eyes: Normal exam ENT   Head: Normocephalic and atraumatic   Mouth/Throat: Mucous membranes are moist. Cardiovascular: Normal rate, regular rhythm. No murmur Respiratory: Normal respiratory effort without tachypnea nor retractions. Breath sounds are clear  Gastrointestinal: Soft and nontender. No distention. Musculoskeletal: Nontender with normal range of motion in all extremities. Neurologic:  Normal speech and language. No gross focal neurologic deficits  Skin:  Skin is warm, dry and intact.  Psychiatric: Mood and affect are normal.   ____________________________________________    INITIAL IMPRESSION / ASSESSMENT AND PLAN / ED COURSE  Pertinent labs & imaging results that were available during my care of the patient were reviewed by me and considered in my medical decision making (see chart for details).  The patient presents the emergency department with hyperglycemia. Patient's labs are significant for an elevated blood glucose of 551. Patient's sodium is 126 consistent with pseudohyponatremia. Patient does have chronic kidney disease, creatinine is slightly elevated of her baseline at 2.8. Patient has a normal anion gap. We will dose IV insulin, potassium is 4.8. We will continue with IV hydration, and we'll check hourly blood glucose measurements. Patient's Lantus insulin was increased to twice a day, and he was also called in a new oral medication. I do not believe we should make any further adjustments to his medication regimen at this time until we have given these new medications an adequate trial to evaluate the efficacy in lowering his blood glucose. Patient is largely asymptomatic.  Blood glucose has decreased to 184. Patient appears well, has new medications at the pharmacy waiting  for him. We'll discharge the patient home with primary care follow-up. I instructed the patient to drink plenty of fluids.  ____________________________________________   FINAL CLINICAL IMPRESSION(S) / ED DIAGNOSES  Hyperglycemia   Harvest Dark, MD 11/03/15 [phone]

## 2015-11-03 NOTE — Telephone Encounter (Signed)
Patient returned call and notified him of my recommendation of ED evaluation. He will comply and is on his way to the ED.

## 2015-11-03 NOTE — Discharge Instructions (Signed)
Hyperglycemia °Hyperglycemia occurs when the glucose (sugar) in your blood is too high. Hyperglycemia can happen for many reasons, but it most often happens to people who do not know they have diabetes or are not managing their diabetes properly.  °CAUSES  °Whether you have diabetes or not, there are other causes of hyperglycemia. Hyperglycemia can occur when you have diabetes, but it can also occur in other situations that you might not be as aware of, such as: °Diabetes °· If you have diabetes and are having problems controlling your blood glucose, hyperglycemia could occur because of some of the following reasons: °¨ Not following your meal plan. °¨ Not taking your diabetes medications or not taking it properly. °¨ Exercising less or doing less activity than you normally do. °¨ Being sick. °Pre-diabetes °· This cannot be ignored. Before people develop Type 2 diabetes, they almost always have "pre-diabetes." This is when your blood glucose levels are higher than normal, but not yet high enough to be diagnosed as diabetes. Research has shown that some long-term damage to the body, especially the heart and circulatory system, may already be occurring during pre-diabetes. If you take action to manage your blood glucose when you have pre-diabetes, you may delay or prevent Type 2 diabetes from developing. °Stress °· If you have diabetes, you may be "diet" controlled or on oral medications or insulin to control your diabetes. However, you may find that your blood glucose is higher than usual in the hospital whether you have diabetes or not. This is often referred to as "stress hyperglycemia." Stress can elevate your blood glucose. This happens because of hormones put out by the body during times of stress. If stress has been the cause of your high blood glucose, it can be followed regularly by your caregiver. That way he/she can make sure your hyperglycemia does not continue to get worse or progress to  diabetes. °Steroids °· Steroids are medications that act on the infection fighting system (immune system) to block inflammation or infection. One side effect can be a rise in blood glucose. Most people can produce enough extra insulin to allow for this rise, but for those who cannot, steroids make blood glucose levels go even higher. It is not unusual for steroid treatments to "uncover" diabetes that is developing. It is not always possible to determine if the hyperglycemia will go away after the steroids are stopped. A special blood test called an A1c is sometimes done to determine if your blood glucose was elevated before the steroids were started. °SYMPTOMS °· Thirsty. °· Frequent urination. °· Dry mouth. °· Blurred vision. °· Tired or fatigue. °· Weakness. °· Sleepy. °· Tingling in feet or leg. °DIAGNOSIS  °Diagnosis is made by monitoring blood glucose in one or all of the following ways: °· A1c test. This is a chemical found in your blood. °· Fingerstick blood glucose monitoring. °· Laboratory results. °TREATMENT  °First, knowing the cause of the hyperglycemia is important before the hyperglycemia can be treated. Treatment may include, but is not be limited to: °· Education. °· Change or adjustment in medications. °· Change or adjustment in meal plan. °· Treatment for an illness, infection, etc. °· More frequent blood glucose monitoring. °· Change in exercise plan. °· Decreasing or stopping steroids. °· Lifestyle changes. °HOME CARE INSTRUCTIONS  °· Test your blood glucose as directed. °· Exercise regularly. Your caregiver will give you instructions about exercise. Pre-diabetes or diabetes which comes on with stress is helped by exercising. °· Eat wholesome,   balanced meals. Eat often and at regular, fixed times. Your caregiver or nutritionist will give you a meal plan to guide your sugar intake. °· Being at an ideal weight is important. If needed, losing as little as 10 to 15 pounds may help improve blood  glucose levels. °SEEK MEDICAL CARE IF:  °· You have questions about medicine, activity, or diet. °· You continue to have symptoms (problems such as increased thirst, urination, or weight gain). °SEEK IMMEDIATE MEDICAL CARE IF:  °· You are vomiting or have diarrhea. °· Your breath smells fruity. °· You are breathing faster or slower. °· You are very sleepy or incoherent. °· You have numbness, tingling, or pain in your feet or hands. °· You have chest pain. °· Your symptoms get worse even though you have been following your caregiver's orders. °· If you have any other questions or concerns. °  °This information is not intended to replace advice given to you by your health care provider. Make sure you discuss any questions you have with your health care provider. °  °Document Released: 11/23/2000 Document Revised: 08/22/2011 Document Reviewed: 02/03/2015 °Elsevier Interactive Patient Education ©2016 [employer] ° °

## 2015-11-03 NOTE — ED Notes (Signed)
Critical glucose 551, Dr.kinner notified

## 2015-11-03 NOTE — Progress Notes (Signed)
Pre visit review using our clinic review tool, if applicable. No additional management support is needed unless otherwise documented below in the visit note. 

## 2015-11-03 NOTE — Progress Notes (Signed)
Subjective:    Patient ID: Jonathan Page, male    DOB: 12-14-1950, 65 y.o.   MRN: [id number]  HPI  Pt presents to the clinic today with c/o elevated blood sugars. He reports his sugars are ranging from 437-600 (non fasting, checking 1.5 hours after eating). He does have a history of DM 2. His last A1C was 12.9, (10/23/2015). Recent hospital admission from 10/18/15 for [place]. He recently saw his PCP for the same 10/27/15- note reviewed. His Metformin was d/c'd secondary to impaired kidney function. His Levemir was increased to 20 units daily. He is taking Levemir as prescribed, first thing in the morning. He is eating Raisin Bran for breakfast, [place] sandwich for lunch, chicken, greens and yams for dinner. He likes to snack on nabs. He drinks mostly water throughout the day. He does not know how to read labels, what he should or should not be eating. He has not been to diabetes education classes. He does feel fatigued, has blurred vision and irritation around the tip of his penis.  Review of Systems      Past Medical History  Diagnosis Date  . Diabetes ([place])   . Concussion syndrome   . Hypertension   . Frequent headaches     Current Outpatient Prescriptions  Medication Sig Dispense Refill  . aspirin EC 81 MG tablet Take 81 mg by mouth daily.    Marland Kitchen atorvastatin (LIPITOR) 40 MG tablet Take 1 tablet (40 mg total) by mouth daily. 30 tablet 2  . Blood Glucose Monitoring Suppl (ACCU-CHEK AVIVA) device Use to test blood sugar 3 times  daily. 1 each 0  . glucose blood (ACCU-CHEK AVIVA) test strip Use to test blood sugar 3 times daily. 100 each 11  . insulin detemir (LEVEMIR) 100 UNIT/ML injection Inject 0.2 mLs (20 Units total) into the skin daily. 10 mL 11  . Lancets (ACCU-CHEK SOFT TOUCH) lancets Use to test blood sugar 3 times daily 100 each 11  . lisinopril-hydrochlorothiazide (PRINZIDE,ZESTORETIC) 20-25 MG tablet Take 1 tablet by mouth daily.    . metFORMIN (GLUCOPHAGE) 500 MG tablet Take 1  tablet (500 mg total) by mouth 2 (two) times daily with a meal. 60 tablet 0   No current facility-administered medications for this visit.    No Known Allergies  Family History  Problem Relation Age of Onset  . Diabetes Father   . Diabetes Paternal Grandfather   . Diabetes Paternal Grandmother     Social History   Social History  . Marital Status: Single    Spouse Name: N/A  . Number of Children: N/A  . Years of Education: N/A   Occupational History  . Not on file.   Social History Main Topics  . Smoking status: Current Every Day Smoker -- 3.00 packs/day    Types: Cigarettes  . Smokeless tobacco: Not on file  . Alcohol Use: 0.0 oz/week    0 Standard drinks or equivalent per week     Comment: occasional beer  . Drug Use: No  . Sexual Activity: Not on file   Other Topics Concern  . Not on file   Social History Narrative   Divorced.   2 children, 1 grandchild.   Retired. Once worked as a Catering manager.    Enjoys relaxing.      Constitutional: Pt reports fatigue. Denies fever, malaise, headache or abrupt weight changes.  HEENT: Pt reports blurred vision. Denies eye pain, eye redness, ear pain, ringing in the ears, wax buildup, runny  nose, nasal congestion, bloody nose, or sore throat. Respiratory: Denies difficulty breathing, shortness of breath, cough or sputum production.   Cardiovascular: Denies chest pain, chest tightness, palpitations or swelling in the hands or feet.  GU: Pt reports urinary frequency and urethral irritation. Denies urgency, pain with urination, burning sensation, blood in urine, odor or discharge.  No other specific complaints in a complete review of systems (except as listed in HPI above).  Objective:   Physical Exam   BP 112/60 mmHg  Pulse 91  Temp(Src) 97.7 F (36.5 C) (Oral)  Wt 202 lb 8 oz (91.853 kg)  SpO2 95% Wt Readings from Last 3 Encounters:  11/03/15 202 lb 8 oz (91.853 kg)  10/27/15 206 lb (93.441 kg)  10/22/15 207  lb 14.3 oz (94.3 kg)    General: Appears his stated age, in NAD. Skin: Warm, dry and intact. No rashes, lesions or ulcerations noted. HEENT: Head: normal shape and size; Eyes: sclera white, no icterus, conjunctiva pink, PERRLA and EOMs intact;  Cardiovascular: Normal rate and rhythm. S1,S2 noted.  No murmur, rubs or gallops noted.  Pulmonary/Chest: Normal effort and positive vesicular breath sounds. No respiratory distress. No wheezes, rales or ronchi noted.  Neurological: Alert and oriented. Sensation intact to BLE.  BMET    Component Value Date/Time   NA 128* 10/27/2015 1114   K 4.5 10/27/2015 1114   CL 94* 10/27/2015 1114   CO2 29 10/27/2015 1114   GLUCOSE 502* 10/27/2015 1114   BUN 29* 10/27/2015 1114   CREATININE 2.03* 10/27/2015 1114   CALCIUM 10.4 10/27/2015 1114   GFRNONAA 41* 10/23/2015 0855   GFRAA 48* 10/23/2015 0855    Lipid Panel     Component Value Date/Time   CHOL 194 10/27/2015 1114   TRIG 311.0* 10/27/2015 1114   HDL 31.80* 10/27/2015 1114   CHOLHDL 6 10/27/2015 1114   VLDL 62.2* 10/27/2015 1114    CBC    Component Value Date/Time   WBC 6.5 10/22/2015 1645   RBC 4.63 10/22/2015 1645   HGB 14.3 10/22/2015 1645   HCT 43.7 10/22/2015 1645   PLT 150 10/22/2015 1645   MCV 94.5 10/22/2015 1645   MCH 30.8 10/22/2015 1645   MCHC 32.6 10/22/2015 1645   RDW 13.5 10/22/2015 1645    Hgb A1C Lab Results  Component Value Date   HGBA1C 12.9* 10/23/2015        Assessment & Plan:   DM 2 with hyperglycemia:  CBG- too high to read Urinalysis: > 2000 glucose, no ketones BMET today I will have him start taking his Levemir before bed Discussed checking fasting sugars (should be  <120), write them down and call them into PCP every Friday. Stop Metformin, start Glipizide 10 mg BID- eRx sent to pharmacy Discussed low carb diet, basic carb counting (handout given) He has an eye exam scheduled for tomorrow Referral placed to diabetes education  RTC in 3  months to follow up with PCP  Webb Silversmith, NP

## 2015-11-03 NOTE — ED Notes (Signed)
Pt reports to ED w/ c/o high CBG.  Pt sts that he went to PCP this AM, received a call this afternoon w/ report of CBG 63.  Pt denies n/v/d, excess thirst, or pain.  A/Ox4.

## 2015-11-03 NOTE — ED Notes (Signed)
Pt states he was sent by PCP for glucose 671.Marland Kitchen Hx of diabetes.Marland Kitchen

## 2015-11-04 LAB — HM DIABETES EYE EXAM

## 2015-11-06 ENCOUNTER — Telehealth: Payer: Self-pay | Admitting: Primary Care

## 2015-11-06 NOTE — Telephone Encounter (Signed)
Spoken and notified patient of Kate's comments. Patient verbalized understanding. 

## 2015-11-06 NOTE — Telephone Encounter (Signed)
Patient called back.  Fasting morning reading are   5/24 107 [id number]   He is checking before meals so far. He is much better than before.

## 2015-11-06 NOTE — Telephone Encounter (Signed)
These are much improved. Please have him continue to monitor his sugars. He needs to check them fasting before each meal.  I also need to see him back in the office in 1 month. Have him bring his sugar logs. Have him call me sooner for FASTING sugars above 300.

## 2015-11-06 NOTE — Telephone Encounter (Signed)
Message left for patient to return my call.  

## 2015-11-06 NOTE — Telephone Encounter (Signed)
Pt called to give you his diabetic readings All reading in am 5/24    107 5/25 166 5/26 166

## 2015-11-16 ENCOUNTER — Telehealth: Payer: Self-pay

## 2015-11-16 NOTE — Telephone Encounter (Signed)
Please notify Jonathan Page that these are good readings. He is to continue to work on improvements in his diet and to remain compliant to his Levemir.  We will call him in 1 month for additional readings. Please have him follow up with me after August 12th for re-evaluation and repeat A1C. Please have him call me sooner for any consistent low numbers below 70, or any consistently elevated numbers above 200.

## 2015-11-16 NOTE — Telephone Encounter (Signed)
Pt left v/m calling in diabetic readings; 11/12/15 in AM 173;  11/13/15 at night BS 130; 11/14/15 in AM BS 80 and  At noon BS148; 11/15/15 BS 148.

## 2015-11-17 NOTE — Telephone Encounter (Signed)
Message left for patient to return my call on 11/16/2015 and 11/17/2015

## 2015-11-17 NOTE — Telephone Encounter (Signed)
Spoken and notified patient of Kate's comments. Patient verbalized understanding. Follow up on 11/25/2015

## 2015-11-27 ENCOUNTER — Other Ambulatory Visit: Payer: Self-pay | Admitting: Primary Care

## 2015-11-27 DIAGNOSIS — E785 Hyperlipidemia, unspecified: Secondary | ICD-10-CM

## 2015-11-27 DIAGNOSIS — Z794 Long term (current) use of insulin: Secondary | ICD-10-CM

## 2015-11-27 DIAGNOSIS — E119 Type 2 diabetes mellitus without complications: Secondary | ICD-10-CM

## 2015-11-27 DIAGNOSIS — E1165 Type 2 diabetes mellitus with hyperglycemia: Secondary | ICD-10-CM

## 2015-11-27 MED ORDER — INSULIN DETEMIR 100 UNIT/ML ~~LOC~~ SOLN: 20.0000 [IU] | SUBCUTANEOUS | Status: DC

## 2015-11-27 MED ORDER — LISINOPRIL-HYDROCHLOROTHIAZIDE 20-25 MG PO TABS: 1.0000 | ORAL_TABLET | Freq: Every day | ORAL | Status: DC

## 2015-11-27 MED ORDER — TRUEPLUS LANCETS 33G MISC: Status: DC

## 2015-11-27 MED ORDER — TRUE METRIX METER DEVI: 1.0000 | Freq: Three times a day (TID) | Status: DC

## 2015-11-27 MED ORDER — BD SWAB SINGLE USE REGULAR PADS: MEDICATED_PAD | Status: DC

## 2015-11-27 MED ORDER — GLUCOSE BLOOD VI STRP: ORAL_STRIP | Status: DC

## 2015-11-27 MED ORDER — GLIPIZIDE 10 MG PO TABS: 10.0000 mg | ORAL_TABLET | Freq: Two times a day (BID) | ORAL | Status: DC

## 2015-11-27 MED ORDER — ATORVASTATIN CALCIUM 40 MG PO TABS: 40.0000 mg | ORAL_TABLET | Freq: Every day | ORAL | Status: DC

## 2015-11-27 MED ORDER — TRUE METRIX LEVEL 1 LOW VI SOLN: Status: DC

## 2015-11-27 NOTE — Telephone Encounter (Signed)
Received request to change medications to be sent to [facility].  Already sent refills.

## 2015-12-16 ENCOUNTER — Telehealth: Payer: Self-pay | Admitting: Primary Care

## 2015-12-16 NOTE — Telephone Encounter (Signed)
-----   Message from [name], NP sent at 11/16/2015  1:11 PM EDT ----- Regarding: Blood Sugar Please check on Jonathan Page's blood sugar readings.

## 2015-12-17 NOTE — Telephone Encounter (Signed)
Message left for patient to return my call.  

## 2015-12-18 NOTE — Telephone Encounter (Signed)
Message left for patient to return my call.  

## 2015-12-21 NOTE — Telephone Encounter (Signed)
Spoken to patient and he stated that his blood sugar reading have been good. This morning it was 124 and it usually average between 125-117. Patient stated that he is making sure he watching what he eating and taking medication.

## 2015-12-21 NOTE — Telephone Encounter (Signed)
Noted  

## 2016-01-07 ENCOUNTER — Telehealth: Payer: Self-pay

## 2016-01-07 DIAGNOSIS — E119 Type 2 diabetes mellitus without complications: Secondary | ICD-10-CM

## 2016-01-07 MED ORDER — INSULIN DETEMIR 100 UNIT/ML ~~LOC~~ SOLN
20.0000 [IU] | SUBCUTANEOUS | 0 refills | Status: DC
Start: 2016-01-07 — End: 2016-07-25

## 2016-01-07 NOTE — Telephone Encounter (Signed)
Pt is waiting on insulin from mail order and has not come in yet. Pt is going out of town on 07//28/17 and anticipates delivery of insulin before leaves town but what to do if not delivered. Pt will call [facility] on 01/08/16 if insuliln does not come in . Pt is going to ck with [employer] also for delivery time.

## 2016-01-07 NOTE — Telephone Encounter (Signed)
Pt request levemir sent to [facility] incase does not receive mail order before leaving town. Advised pt done.

## 2016-01-07 NOTE — Addendum Note (Signed)
Addended by: Helene Shoe on: 01/07/2016 12:48 PM   Modules accepted: Orders

## 2016-01-25 ENCOUNTER — Encounter: Payer: Self-pay | Admitting: Primary Care

## 2016-01-25 ENCOUNTER — Ambulatory Visit (INDEPENDENT_AMBULATORY_CARE_PROVIDER_SITE_OTHER): Payer: Medicare HMO | Admitting: Primary Care

## 2016-01-25 VITALS — BP 122/76 | HR 78 | Temp 97.6°F | Ht 73.0 in | Wt 223.0 lb

## 2016-01-25 DIAGNOSIS — E1100 Type 2 diabetes mellitus with hyperosmolarity without nonketotic hyperglycemic-hyperosmolar coma (NKHHC): Secondary | ICD-10-CM

## 2016-01-25 DIAGNOSIS — E1101 Type 2 diabetes mellitus with hyperosmolarity with coma: Secondary | ICD-10-CM | POA: Diagnosis not present

## 2016-01-25 DIAGNOSIS — I10 Essential (primary) hypertension: Secondary | ICD-10-CM

## 2016-01-25 DIAGNOSIS — Z794 Long term (current) use of insulin: Secondary | ICD-10-CM | POA: Diagnosis not present

## 2016-01-25 DIAGNOSIS — E119 Type 2 diabetes mellitus without complications: Secondary | ICD-10-CM | POA: Diagnosis not present

## 2016-01-25 DIAGNOSIS — N183 Chronic kidney disease, stage 3 unspecified: Secondary | ICD-10-CM | POA: Insufficient documentation

## 2016-01-25 DIAGNOSIS — E785 Hyperlipidemia, unspecified: Secondary | ICD-10-CM

## 2016-01-25 DIAGNOSIS — N19 Unspecified kidney failure: Secondary | ICD-10-CM

## 2016-01-25 LAB — COMPREHENSIVE METABOLIC PANEL
ALBUMIN: 4.6 g/dL (ref 3.5–5.2)
ALK PHOS: 53 U/L (ref 39–117)
ALT: 39 U/L (ref 0–53)
AST: 29 U/L (ref 0–37)
BILIRUBIN TOTAL: 0.4 mg/dL (ref 0.2–1.2)
BUN: 24 mg/dL — ABNORMAL HIGH (ref 6–23)
CALCIUM: 10.2 mg/dL (ref 8.4–10.5)
CO2: 31 mEq/L (ref 19–32)
Chloride: 101 mEq/L (ref 96–112)
Creatinine, Ser: 1.69 mg/dL — ABNORMAL HIGH (ref 0.40–1.50)
GFR: 52.68 mL/min — AB (ref >60.00)
GLUCOSE: 121 mg/dL — AB (ref 70–99)
Potassium: 4.3 mEq/L (ref 3.5–5.1)
Sodium: 137 mEq/L (ref 135–145)
TOTAL PROTEIN: 7.6 g/dL (ref 6.0–8.3)

## 2016-01-25 LAB — LIPID PANEL
CHOL/HDL RATIO: 3
Cholesterol: 129 mg/dL (ref 0–200)
HDL: 38.1 mg/dL — AB (ref >39.00)
NONHDL: 90.44
TRIGLYCERIDES: 244 mg/dL — AB (ref 0.0–149.0)
VLDL: 48.8 mg/dL — AB (ref 0.0–40.0)

## 2016-01-25 LAB — HEMOGLOBIN A1C: Hgb A1c MFr Bld: 7 % — ABNORMAL HIGH (ref 4.6–6.5)

## 2016-01-25 LAB — LDL CHOLESTEROL, DIRECT: LDL DIRECT: 45 mg/dL

## 2016-01-25 NOTE — Progress Notes (Signed)
Subjective:    Patient ID: Jonathan Page, male    DOB: December 10, 1950, 65 y.o.   MRN: [id number]  HPI  [name] is a 65 year old male who presents today for follow up.  1) Type 2 Diabetes: Established care in May 2017 after a hospitalization for HHNK with an A1C of 12.9. He has a history of diabetes dating back to 2012, but had not had medication for this as his diabetes had improved. Since his last visit he's had several episodes of hyperglycemia requiring multiple office and emergency visits. He was initially managed on Levemir 14 units once daily and Metformin 500 mg BID. He is now managed on Levemir 20 units BID and Glipizide 10 mg BID.  Since his last visit he's checking his blood sugars twice daily (fasting in the morning and also before bed). His fasting sugars in the morning are running in the 120's, and evening sugars are running 115-120. He will experience some low numbers in the 50's-60's once weekly when he's gone without eating for a prolonged period of time. He is due today for repeat A1C.   His diet currently consists of: Breakfast: Oatmeal, occasional eggs and toast. Lunch: Baked chicken Dinner: Baked chicken, vegetables Snacks: Sugar free cookies Desserts: Yogurt Beverages: Water  Exercise: He walks and lifts weights daily.  2) Hyperlipidemia: Currently managed on Lipitor 40 mg that was initiated last visit. He is due today for repeat lipids. Denies myalgias.   3) Chronic Renal Failure: Creatinine from May 2017 ranging 1.6-2.8. Metformin discontinued in May due to this. Also with uncontrolled hyperglycemia in May which has since improved. He is managed on Lisinopril/HCTZ for hypertension. He is due today for repeat labs.  Review of Systems  Respiratory: Negative for shortness of breath.   Cardiovascular: Negative for chest pain.  Musculoskeletal: Negative for myalgias.  Neurological: Negative for dizziness, numbness and headaches.       Past Medical History:    Diagnosis Date  . Concussion syndrome   . Diabetes ([place])   . Frequent headaches   . Hypertension      Social History   Social History  . Marital status: Single    Spouse name: N/A  . Number of children: N/A  . Years of education: N/A   Occupational History  . Not on file.   Social History Main Topics  . Smoking status: Light Tobacco Smoker    Types: Cigarettes  . Smokeless tobacco: Never Used  . Alcohol use 0.0 oz/week     Comment: occasional beer  . Drug use: No  . Sexual activity: Not on file   Other Topics Concern  . Not on file   Social History Narrative   Divorced.   2 children, 1 grandchild.   Retired. Once worked as a Catering manager.    Enjoys relaxing.     Past Surgical History:  Procedure Laterality Date  . APPENDECTOMY  1960    Family History  Problem Relation Age of Onset  . Diabetes Father   . Diabetes Paternal Grandfather   . Diabetes Paternal Grandmother     No Known Allergies  Current Outpatient Prescriptions on File Prior to Visit  Medication Sig Dispense Refill  . Alcohol Swabs (B-D SINGLE USE SWABS REGULAR) PADS Use to test blood sugar 3 times daily 300 each 3  . aspirin EC 81 MG tablet Take 81 mg by mouth daily.    Marland Kitchen atorvastatin (LIPITOR) 40 MG tablet Take 1 tablet (40 mg total)  by mouth daily. 90 tablet 1  . Blood Glucose Calibration (TRUE METRIX LEVEL 1) Low SOLN Use to test blood sugar 3 times daily 3 each 3  . Blood Glucose Monitoring Suppl (TRUE METRIX METER) DEVI 1 Device by Does not apply route 3 (three) times daily. Use to test blood sugar 3 times daily 1 Device 0  . glipiZIDE (GLUCOTROL) 10 MG tablet Take 1 tablet (10 mg total) by mouth 2 (two) times daily before a meal. 180 tablet 1  . glucose blood (TRUE METRIX BLOOD GLUCOSE TEST) test strip Use to test blood sugar 3 times daily 300 each 3  . insulin detemir (LEVEMIR) 100 UNIT/ML injection Inject 0.2 mLs (20 Units total) into the skin daily. (Patient taking differently:  Inject 20 Units into the skin 2 (two) times daily. ) 30 mL 0  . lisinopril-hydrochlorothiazide (PRINZIDE,ZESTORETIC) 20-25 MG tablet Take 1 tablet by mouth daily. 90 tablet 3  . TRUEPLUS LANCETS 33G MISC Use to test blood sugar 3 times daily 300 each 3   No current facility-administered medications on file prior to visit.     BP 122/76   Pulse 78   Temp 97.6 F (36.4 C) (Oral)   Ht 6\' 1"  (1.854 m)   Wt 223 lb (101.2 kg)   SpO2 97%   BMI 29.42 kg/m    Objective:   Physical Exam  Constitutional: He appears well-nourished.  Neck: Neck supple.  Cardiovascular: Normal rate and regular rhythm.   Pulmonary/Chest: Effort normal and breath sounds normal.  Skin: Skin is warm and dry.  Psychiatric: He has a normal mood and affect.          Assessment & Plan:

## 2016-01-25 NOTE — Assessment & Plan Note (Signed)
Creatinine in May 2017 ranging 1.6-2.9, also with uncontrolled hyperglycemia at that time. Recheck BMP today. Managed on ACE.

## 2016-01-25 NOTE — Assessment & Plan Note (Signed)
Stable today. Continue lisinopril-hctz. Check BMP today.

## 2016-01-25 NOTE — Assessment & Plan Note (Signed)
Above goal on last visit. Currently managed on Lipitor 40 mg once daily. No myalgias. Due for LFT's and repeat lipids today.

## 2016-01-25 NOTE — Progress Notes (Signed)
Pre visit review using our clinic review tool, if applicable. No additional management support is needed unless otherwise documented below in the visit note. 

## 2016-01-25 NOTE — Assessment & Plan Note (Signed)
Due for repeat A1c today. Suspect improvement in A1C given home blood sugar readings. May need to discontinue Glipizide given low readings between 50-60 on several incidences. Continue Levemir 20 units BID.  Managed on ACE and statin. Urine microalbumin on file.

## 2016-01-25 NOTE — Patient Instructions (Addendum)
Complete lab work prior to leaving today. I will notify you of your results once received.   Continue your efforts towards a healthy lifestyle through diet and exercise.  It was a pleasure to see you today! Have a safe and fun trip!

## 2016-01-27 ENCOUNTER — Telehealth: Payer: Self-pay | Admitting: Primary Care

## 2016-01-27 NOTE — Telephone Encounter (Signed)
Spoken and notified patient of Kate's comments. Patient verbalized understanding. 

## 2016-01-27 NOTE — Telephone Encounter (Signed)
Patient returned Chan's call. °

## 2016-04-15 ENCOUNTER — Other Ambulatory Visit: Payer: Self-pay | Admitting: Primary Care

## 2016-04-15 DIAGNOSIS — E1101 Type 2 diabetes mellitus with hyperosmolarity with coma: Secondary | ICD-10-CM

## 2016-04-26 ENCOUNTER — Other Ambulatory Visit (INDEPENDENT_AMBULATORY_CARE_PROVIDER_SITE_OTHER): Payer: Medicare HMO

## 2016-04-26 DIAGNOSIS — E1101 Type 2 diabetes mellitus with hyperosmolarity with coma: Secondary | ICD-10-CM

## 2016-04-26 LAB — HEMOGLOBIN A1C: Hgb A1c MFr Bld: 7.9 % — ABNORMAL HIGH (ref 4.6–6.5)

## 2016-06-20 ENCOUNTER — Ambulatory Visit (INDEPENDENT_AMBULATORY_CARE_PROVIDER_SITE_OTHER): Payer: Medicare HMO | Admitting: Primary Care

## 2016-06-20 ENCOUNTER — Encounter: Payer: Self-pay | Admitting: Primary Care

## 2016-06-20 VITALS — BP 144/86 | HR 88 | Temp 97.7°F | Ht 73.0 in | Wt 225.0 lb

## 2016-06-20 DIAGNOSIS — E119 Type 2 diabetes mellitus without complications: Secondary | ICD-10-CM | POA: Diagnosis not present

## 2016-06-20 DIAGNOSIS — E1101 Type 2 diabetes mellitus with hyperosmolarity with coma: Secondary | ICD-10-CM | POA: Diagnosis not present

## 2016-06-20 DIAGNOSIS — Z23 Encounter for immunization: Secondary | ICD-10-CM | POA: Diagnosis not present

## 2016-06-20 DIAGNOSIS — I10 Essential (primary) hypertension: Secondary | ICD-10-CM | POA: Diagnosis not present

## 2016-06-20 DIAGNOSIS — Z794 Long term (current) use of insulin: Secondary | ICD-10-CM

## 2016-06-20 DIAGNOSIS — E1100 Type 2 diabetes mellitus with hyperosmolarity without nonketotic hyperglycemic-hyperosmolar coma (NKHHC): Secondary | ICD-10-CM

## 2016-06-20 DIAGNOSIS — E785 Hyperlipidemia, unspecified: Secondary | ICD-10-CM | POA: Diagnosis not present

## 2016-06-20 NOTE — Addendum Note (Signed)
Addended by: Jacqualin Combes on: 06/20/2016 11:21 AM   Modules accepted: Orders

## 2016-06-20 NOTE — Progress Notes (Signed)
Pre visit review using our clinic review tool, if applicable. No additional management support is needed unless otherwise documented below in the visit note. 

## 2016-06-20 NOTE — Patient Instructions (Signed)
Schedule a lab only appointment in 1 month.  You were provided with a pneumonia vaccination today.  It is important that you improve your diet. Please limit carbohydrates in the form of white bread, rice, pasta, sweets, fast food, fried food, sugary drinks, etc. Increase your consumption of fresh fruits and vegetables, whole grains, lean protein.  Ensure you are consuming 64 ounces of water daily.  Start exercising. You should be getting 150 minutes of moderate intensity exercise weekly.  Please schedule a physical with me in 6 months. You may also schedule a lab only appointment 3-4 days prior. We will discuss your lab results in detail during your physical.  It was a pleasure to see you today!  Diabetes Mellitus and Food It is important for you to manage your blood sugar (glucose) level. Your blood glucose level can be greatly affected by what you eat. Eating healthier foods in the appropriate amounts throughout the day at about the same time each day will help you control your blood glucose level. It can also help slow or prevent worsening of your diabetes mellitus. Healthy eating may even help you improve the level of your blood pressure and reach or maintain a healthy weight. General recommendations for healthful eating and cooking habits include:  Eating meals and snacks regularly. Avoid going long periods of time without eating to lose weight.  Eating a diet that consists mainly of plant-based foods, such as fruits, vegetables, nuts, legumes, and whole grains.  Using low-heat cooking methods, such as baking, instead of high-heat cooking methods, such as deep frying. Work with your dietitian to make sure you understand how to use the Nutrition Facts information on food labels. How can food affect me? Carbohydrates  Carbohydrates affect your blood glucose level more than any other type of food. Your dietitian will help you determine how many carbohydrates to eat at each meal and teach  you how to count carbohydrates. Counting carbohydrates is important to keep your blood glucose at a healthy level, especially if you are using insulin or taking certain medicines for diabetes mellitus. Alcohol  Alcohol can cause sudden decreases in blood glucose (hypoglycemia), especially if you use insulin or take certain medicines for diabetes mellitus. Hypoglycemia can be a life-threatening condition. Symptoms of hypoglycemia (sleepiness, dizziness, and disorientation) are similar to symptoms of having too much alcohol. If your health care provider has given you approval to drink alcohol, do so in moderation and use the following guidelines:  Women should not have more than one drink per day, and men should not have more than two drinks per day. One drink is equal to:  12 oz of beer.  5 oz of wine.  1 oz of hard liquor.  Do not drink on an empty stomach.  Keep yourself hydrated. Have water, diet soda, or unsweetened iced tea.  Regular soda, juice, and other mixers might contain a lot of carbohydrates and should be counted. What foods are not recommended? As you make food choices, it is important to remember that all foods are not the same. Some foods have fewer nutrients per serving than other foods, even though they might have the same number of calories or carbohydrates. It is difficult to get your body what it needs when you eat foods with fewer nutrients. Examples of foods that you should avoid that are high in calories and carbohydrates but low in nutrients include:  Trans fats (most processed foods list trans fats on the Nutrition Facts label).  Regular soda.  Juice.  Candy.  Sweets, such as cake, pie, doughnuts, and cookies.  Fried foods. What foods can I eat? Eat nutrient-rich foods, which will nourish your body and keep you healthy. The food you should eat also will depend on several factors, including:  The calories you need.  The medicines you take.  Your  weight.  Your blood glucose level.  Your blood pressure level.  Your cholesterol level. You should eat a variety of foods, including:  Protein.  Lean cuts of meat.  Proteins low in saturated fats, such as fish, egg whites, and beans. Avoid processed meats.  Fruits and vegetables.  Fruits and vegetables that may help control blood glucose levels, such as apples, mangoes, and yams.  Dairy products.  Choose fat-free or low-fat dairy products, such as milk, yogurt, and cheese.  Grains, bread, pasta, and rice.  Choose whole grain products, such as multigrain bread, whole oats, and brown rice. These foods may help control blood pressure.  Fats.  Foods containing healthful fats, such as nuts, avocado, olive oil, canola oil, and fish. Does everyone with diabetes mellitus have the same meal plan? Because every person with diabetes mellitus is different, there is not one meal plan that works for everyone. It is very important that you meet with a dietitian who will help you create a meal plan that is just right for you. This information is not intended to replace advice given to you by your health care provider. Make sure you discuss any questions you have with your health care provider. Document Released: 02/24/2005 Document Revised: 11/05/2015 Document Reviewed: 04/26/2013 Elsevier Interactive Patient Education  2017 [employer].

## 2016-06-20 NOTE — Assessment & Plan Note (Signed)
Slightly above goal today, other office visit readings at goal. Will have him monitor at home and report readings at or above 140/90 on a regular basis. BMP due next month.

## 2016-06-20 NOTE — Progress Notes (Signed)
Subjective:    Patient ID: Jonathan Page, male    DOB: Nov 09, 1950, 66 y.o.   MRN: [id number]  HPI  Jonathan Page is a 66 year old male who presents today for follow up.  1) Type 2 Diabetes: Currently managed on Glipizide 10 mg BID, Levemir 20 units BID. His last A1C was 7.9 in November 2017. He's working towards a healthy diet now that the [place] are over. He is walking on his treadmill daily and has been doing this for several months. He's checking his sugars twice daily. His fasting AM sugars run around 130's and PM sugars run upper 100's to low 200's. He is due for repeat A1C next month. He denies visual changes, dizziness, numbness/tingling to his feet.  Wt Readings from Last 3 Encounters:  06/20/16 225 lb (102.1 kg)  01/25/16 223 lb (101.2 kg)  11/03/15 202 lb 8 oz (91.9 kg)     2) Hyperlipidemia: Triglycerides in August 2017 above goal. Currently managed on atorvastatin 40 mg and 1000 mg of Fish Oil daily. He is due for repeat lipids in 1 month.  3) Essential Hypertension: Currently managed on lisinopril-HCTZ 20/25 mg. His blood pressure in the office today is 144/86. He does not regularly check his BP at home. He denies chest pain, shortness of breath, dizziness.  Review of Systems  Constitutional: Negative for fatigue.  Eyes: Negative for visual disturbance.  Respiratory: Negative for shortness of breath.   Cardiovascular: Negative for chest pain.  Neurological: Negative for dizziness and numbness.       Past Medical History:  Diagnosis Date  . Concussion syndrome   . Diabetes ([place])   . Frequent headaches   . Hypertension      Social History   Social History  . Marital status: Single    Spouse name: N/A  . Number of children: N/A  . Years of education: N/A   Occupational History  . Not on file.   Social History Main Topics  . Smoking status: Light Tobacco Smoker    Types: Cigarettes  . Smokeless tobacco: Never Used  . Alcohol use 0.0 oz/week   Comment: occasional beer  . Drug use: No  . Sexual activity: Not on file   Other Topics Concern  . Not on file   Social History Narrative   Divorced.   2 children, 1 grandchild.   Retired. Once worked as a Catering manager.    Enjoys relaxing.     Past Surgical History:  Procedure Laterality Date  . APPENDECTOMY  1960    Family History  Problem Relation Age of Onset  . Diabetes Father   . Diabetes Paternal Grandfather   . Diabetes Paternal Grandmother     No Known Allergies  Current Outpatient Prescriptions on File Prior to Visit  Medication Sig Dispense Refill  . Alcohol Swabs (B-D SINGLE USE SWABS REGULAR) PADS Use to test blood sugar 3 times daily 300 each 3  . aspirin EC 81 MG tablet Take 81 mg by mouth daily.    Marland Kitchen atorvastatin (LIPITOR) 40 MG tablet Take 1 tablet (40 mg total) by mouth daily. 90 tablet 1  . Blood Glucose Calibration (TRUE METRIX LEVEL 1) Low SOLN Use to test blood sugar 3 times daily 3 each 3  . Blood Glucose Monitoring Suppl (TRUE METRIX METER) DEVI 1 Device by Does not apply route 3 (three) times daily. Use to test blood sugar 3 times daily 1 Device 0  . glipiZIDE (GLUCOTROL) 10 MG tablet  Take 1 tablet (10 mg total) by mouth 2 (two) times daily before a meal. 180 tablet 1  . glucose blood (TRUE METRIX BLOOD GLUCOSE TEST) test strip Use to test blood sugar 3 times daily 300 each 3  . insulin detemir (LEVEMIR) 100 UNIT/ML injection Inject 0.2 mLs (20 Units total) into the skin daily. (Patient taking differently: Inject 20 Units into the skin 2 (two) times daily. ) 30 mL 0  . lisinopril-hydrochlorothiazide (PRINZIDE,ZESTORETIC) 20-25 MG tablet Take 1 tablet by mouth daily. 90 tablet 3  . TRUEPLUS LANCETS 33G MISC Use to test blood sugar 3 times daily 300 each 3   No current facility-administered medications on file prior to visit.     BP (!) 144/86   Pulse 88   Temp 97.7 F (36.5 C) (Oral)   Ht 6\' 1"  (1.854 m)   Wt 225 lb (102.1 kg)   SpO2 96%    BMI 29.69 kg/m    Objective:   Physical Exam  Constitutional: He appears well-nourished.  Neck: Neck supple.  Cardiovascular: Normal rate and regular rhythm.   Pulmonary/Chest: Effort normal and breath sounds normal.  Skin: Skin is warm and dry.          Assessment & Plan:

## 2016-06-20 NOTE — Assessment & Plan Note (Signed)
Trigs above goal in November 2017, due for repeat lipids next month. Continue Fish Oil, statin, exercise, healthy diet.

## 2016-06-20 NOTE — Assessment & Plan Note (Signed)
Check BMP. Managed on ACE.

## 2016-06-20 NOTE — Assessment & Plan Note (Signed)
A1C of 7.9 in November 2017, due for repeat next month. Discussed the importance of a healthy diet and regular exercise in order for weight loss to reduce blood sugars. Commended him on exercise. Prevnar due and provided today. Microalbumin and A1C due next month. Foot exam next visit. Stable on ACE and statin.

## 2016-07-06 ENCOUNTER — Encounter: Payer: Self-pay | Admitting: Emergency Medicine

## 2016-07-06 DIAGNOSIS — Z7982 Long term (current) use of aspirin: Secondary | ICD-10-CM | POA: Diagnosis not present

## 2016-07-06 DIAGNOSIS — E1165 Type 2 diabetes mellitus with hyperglycemia: Secondary | ICD-10-CM | POA: Diagnosis not present

## 2016-07-06 DIAGNOSIS — I10 Essential (primary) hypertension: Secondary | ICD-10-CM | POA: Diagnosis not present

## 2016-07-06 DIAGNOSIS — Z79899 Other long term (current) drug therapy: Secondary | ICD-10-CM | POA: Diagnosis not present

## 2016-07-06 DIAGNOSIS — Z794 Long term (current) use of insulin: Secondary | ICD-10-CM | POA: Diagnosis not present

## 2016-07-06 DIAGNOSIS — R739 Hyperglycemia, unspecified: Secondary | ICD-10-CM | POA: Diagnosis present

## 2016-07-06 DIAGNOSIS — F17210 Nicotine dependence, cigarettes, uncomplicated: Secondary | ICD-10-CM | POA: Diagnosis not present

## 2016-07-06 LAB — BASIC METABOLIC PANEL
Anion gap: 9 (ref 5–15)
BUN: 45 mg/dL — AB (ref 6–20)
CHLORIDE: 93 mmol/L — AB (ref 101–111)
CO2: 26 mmol/L (ref 22–32)
CREATININE: 2.4 mg/dL — AB (ref 0.61–1.24)
Calcium: 9.3 mg/dL (ref 8.9–10.3)
GFR calc Af Amer: 31 mL/min — ABNORMAL LOW (ref >60)
GFR calc non Af Amer: 27 mL/min — ABNORMAL LOW (ref >60)
GLUCOSE: 564 mg/dL — AB (ref 65–99)
Potassium: 4.2 mmol/L (ref 3.5–5.1)
SODIUM: 128 mmol/L — AB (ref 135–145)

## 2016-07-06 LAB — URINALYSIS, COMPLETE (UACMP) WITH MICROSCOPIC
BACTERIA UA: NONE SEEN
Bilirubin Urine: NEGATIVE
Hgb urine dipstick: NEGATIVE
Ketones, ur: NEGATIVE mg/dL
Leukocytes, UA: NEGATIVE
Nitrite: NEGATIVE
PH: 5 (ref 5.0–8.0)
Protein, ur: NEGATIVE mg/dL
SQUAMOUS EPITHELIAL / LPF: NONE SEEN
Specific Gravity, Urine: 1.023 (ref 1.005–1.030)
WBC, UA: NONE SEEN WBC/hpf (ref 0–5)

## 2016-07-06 LAB — GLUCOSE, CAPILLARY: Glucose-Capillary: 591 mg/dL (ref 65–99)

## 2016-07-06 LAB — CBC
HEMATOCRIT: 46.3 % (ref 40.0–52.0)
Hemoglobin: 16.2 g/dL (ref 13.0–18.0)
MCH: 31.7 pg (ref 26.0–34.0)
MCHC: 34.9 g/dL (ref 32.0–36.0)
MCV: 90.9 fL (ref 80.0–100.0)
PLATELETS: 154 10*3/uL (ref 150–440)
RBC: 5.09 MIL/uL (ref 4.40–5.90)
RDW: 14.4 % (ref 11.5–14.5)
WBC: 7.4 10*3/uL (ref 3.8–10.6)

## 2016-07-06 NOTE — ED Triage Notes (Signed)
CBG in triage 591.

## 2016-07-06 NOTE — ED Notes (Signed)
Pt unable to give urine specimen at this time. Cup given and instructed to bring to front desk when did.

## 2016-07-06 NOTE — ED Triage Notes (Signed)
Pt c/o CBG in the 600s today. Pt states it has been running 300-600 for 2-3 weeks now. On 20 units Levemir BID.

## 2016-07-07 ENCOUNTER — Emergency Department
Admission: EM | Admit: 2016-07-07 | Discharge: 2016-07-07 | Disposition: A | Discharge status: Home / Self Care | Payer: Medicare HMO | Attending: Emergency Medicine | Admitting: Emergency Medicine

## 2016-07-07 DIAGNOSIS — R739 Hyperglycemia, unspecified: Secondary | ICD-10-CM

## 2016-07-07 LAB — GLUCOSE, CAPILLARY: GLUCOSE-CAPILLARY: 233 mg/dL — AB (ref 65–99)

## 2016-07-07 MED ORDER — SODIUM CHLORIDE 0.9 % IV BOLUS (SEPSIS)
1000.0000 mL | Freq: Once | INTRAVENOUS | Status: AC
  Administered 2016-07-07: 1000 mL via INTRAVENOUS

## 2016-07-07 MED ORDER — INSULIN ASPART 100 UNIT/ML ~~LOC~~ SOLN
8.0000 [IU] | Freq: Once | SUBCUTANEOUS | Status: AC
  Administered 2016-07-07: 8 [IU] via INTRAVENOUS
  Filled 2016-07-07: qty 8

## 2016-07-07 NOTE — ED Notes (Signed)
BS 233

## 2016-07-07 NOTE — ED Provider Notes (Signed)
[facility] Emergency Department Provider Note    First MD Initiated Contact with Patient 07/07/16 [phone]     (approximate)  I have reviewed the triage vital signs and the nursing notes.   HISTORY  Chief Complaint Hyperglycemia   HPI Jonathan Page is a 66 y.o. male with bolus of chronic medical conditions including diabetes presents to the emergency department with hyperglycemia glucose ranging from 300-500 in the past 2-3 weeks.   Past Medical History:  Diagnosis Date  . Concussion syndrome   . Diabetes ([place])   . Frequent headaches   . Hypertension     Patient Active Problem List   Diagnosis Date Noted  . Hyperlipidemia 01/25/2016  . Renal failure 01/25/2016  . Essential hypertension 10/27/2015  . Type 2 diabetes mellitus with hyperosmolar nonketotic hyperglycemia ([place]) 10/22/2015    Past Surgical History:  Procedure Laterality Date  . APPENDECTOMY  1960    Prior to Admission medications   Medication Sig Start Date End Date Taking? Authorizing Provider  Alcohol Swabs (B-D SINGLE USE SWABS REGULAR) PADS Use to test blood sugar 3 times daily 11/27/15   [name], NP  aspirin EC 81 MG tablet Take 81 mg by mouth daily.    Historical Provider, MD  atorvastatin (LIPITOR) 40 MG tablet Take 1 tablet (40 mg total) by mouth daily. 11/27/15   [name], NP  Blood Glucose Calibration (TRUE METRIX LEVEL 1) Low SOLN Use to test blood sugar 3 times daily 11/27/15   [name], NP  Blood Glucose Monitoring Suppl (TRUE METRIX METER) DEVI 1 Device by Does not apply route 3 (three) times daily. Use to test blood sugar 3 times daily 11/27/15   [name], NP  glipiZIDE (GLUCOTROL) 10 MG tablet Take 1 tablet (10 mg total) by mouth 2 (two) times daily before a meal. 11/27/15   [name], NP  glucose blood (TRUE METRIX BLOOD GLUCOSE TEST) test strip Use to test blood sugar 3 times daily 11/27/15   [name], NP  insulin  detemir (LEVEMIR) 100 UNIT/ML injection Inject 0.2 mLs (20 Units total) into the skin daily. Patient taking differently: Inject 20 Units into the skin 2 (two) times daily.  01/07/16   [name], NP  lisinopril-hydrochlorothiazide (PRINZIDE,ZESTORETIC) 20-25 MG tablet Take 1 tablet by mouth daily. 11/27/15   [name], NP  TRUEPLUS LANCETS 33G MISC Use to test blood sugar 3 times daily 11/27/15   [name], NP    Allergies No known drug allergies  Family History  Problem Relation Age of Onset  . Diabetes Father   . Diabetes Paternal Grandfather   . Diabetes Paternal Grandmother     Social History Social History  Substance Use Topics  . Smoking status: Light Tobacco Smoker    Types: Cigarettes  . Smokeless tobacco: Never Used  . Alcohol use 0.0 oz/week     Comment: occasional beer    Review of Systems Constitutional: No fever/chills Eyes: No visual changes. ENT: No sore throat. Cardiovascular: Denies chest pain. Respiratory: Denies shortness of breath. Gastrointestinal: No abdominal pain.  No nausea, no vomiting.  No diarrhea.  No constipation. Genitourinary: Negative for dysuria. Musculoskeletal: Negative for back pain. Skin: Negative for rash. Neurological: Negative for headaches, focal weakness or numbness.  10-point ROS otherwise negative.  ____________________________________________   PHYSICAL EXAM:  VITAL SIGNS: ED Triage Vitals  Enc Vitals Group     BP 07/06/16 2309 130/72  Pulse Rate 07/06/16 2309 91     Resp 07/06/16 2309 20     Temp 07/06/16 2309 97.7 F (36.5 C)     Temp Source 07/06/16 2309 Oral     SpO2 07/06/16 2309 96 %     Weight 07/06/16 2310 225 lb (102.1 kg)     Height 07/06/16 2310 6\' 1"  (1.854 m)     Head Circumference --      Peak Flow --      Pain Score --      Pain Loc --      Pain Edu? --      Excl. in [address]? --     Constitutional: Alert and oriented. Well appearing and in no acute distress. Eyes:  Conjunctivae are normal. PERRL. EOMI. Head: Atraumatic. Mouth/Throat: Mucous membranes are moist.  Oropharynx non-erythematous. Neck: No stridor.   Cardiovascular: Normal rate, regular rhythm. Good peripheral circulation. Grossly normal heart sounds. Respiratory: Normal respiratory effort.  No retractions. Lungs CTAB. Gastrointestinal: Soft and nontender. No distention.  Musculoskeletal: No lower extremity tenderness nor edema. No gross deformities of extremities. Neurologic:  Normal speech and language. No gross focal neurologic deficits are appreciated.  Skin:  Skin is warm, dry and intact. No rash noted.   ____________________________________________   LABS (all labs ordered are listed, but only abnormal results are displayed)  Labs Reviewed  BASIC METABOLIC PANEL - Abnormal; Notable for the following:       Result Value   Sodium 128 (*)    Chloride 93 (*)    Glucose, Bld 564 (*)    BUN 45 (*)    Creatinine, Ser 2.40 (*)    GFR calc non Af Amer 27 (*)    GFR calc Af Amer 31 (*)    All other components within normal limits  URINALYSIS, COMPLETE (UACMP) WITH MICROSCOPIC - Abnormal; Notable for the following:    Color, Urine STRAW (*)    APPearance CLEAR (*)    Glucose, UA >=500 (*)    All other components within normal limits  GLUCOSE, CAPILLARY - Abnormal; Notable for the following:    Glucose-Capillary 591 (*)    All other components within normal limits  CBC  CBG MONITORING, ED     Procedures     INITIAL IMPRESSION / ASSESSMENT AND PLAN / ED COURSE  Pertinent labs & imaging results that were available during my care of the patient were reviewed by me and considered in my medical decision making (see chart for details).   66 year old male with history of diabetes presents to the emergency department hyperglycemia glucose ranging from 300-500 for the past 2-3 weeks. Emergency Department 9 patient received 2 L IV normal saline as well as 8 units of insulin IV  with resultant glucose 233     ____________________________________________  FINAL CLINICAL IMPRESSION(S) / ED DIAGNOSES  Final diagnoses:  Hyperglycemia     MEDICATIONS GIVEN DURING THIS VISIT:  Medications  sodium chloride 0.9 % bolus 1,000 mL (1,000 mLs Intravenous New Bag/Given 07/07/16 0224)  sodium chloride 0.9 % bolus 1,000 mL (1,000 mLs Intravenous New Bag/Given 07/07/16 0224)  insulin aspart (novoLOG) injection 8 Units (8 Units Intravenous Given 07/07/16 0236)     NEW OUTPATIENT MEDICATIONS STARTED DURING THIS VISIT:  New Prescriptions   No medications on file    Modified Medications   No medications on file    Discontinued Medications   No medications on file     Note:  This document was prepared using  Dragon [employer] forces training and education officer and may include unintentional dictation errors.    Gregor Hams, MD 07/07/16 [phone]

## 2016-07-21 ENCOUNTER — Other Ambulatory Visit (INDEPENDENT_AMBULATORY_CARE_PROVIDER_SITE_OTHER): Payer: Medicare HMO

## 2016-07-21 DIAGNOSIS — Z794 Long term (current) use of insulin: Secondary | ICD-10-CM | POA: Diagnosis not present

## 2016-07-21 DIAGNOSIS — E119 Type 2 diabetes mellitus without complications: Secondary | ICD-10-CM | POA: Diagnosis not present

## 2016-07-21 DIAGNOSIS — I10 Essential (primary) hypertension: Secondary | ICD-10-CM | POA: Diagnosis not present

## 2016-07-21 LAB — LIPID PANEL
CHOL/HDL RATIO: 5
CHOLESTEROL: 170 mg/dL (ref 0–200)
HDL: 32.9 mg/dL — ABNORMAL LOW (ref >39.00)
NonHDL: 137.5
TRIGLYCERIDES: 380 mg/dL — AB (ref 0.0–149.0)
VLDL: 76 mg/dL — AB (ref 0.0–40.0)

## 2016-07-21 LAB — HEMOGLOBIN A1C: Hgb A1c MFr Bld: 13.1 % — ABNORMAL HIGH (ref 4.6–6.5)

## 2016-07-21 LAB — LDL CHOLESTEROL, DIRECT: LDL DIRECT: 66 mg/dL

## 2016-07-21 LAB — BASIC METABOLIC PANEL
BUN: 33 mg/dL — AB (ref 6–23)
CO2: 31 mEq/L (ref 19–32)
Calcium: 9.9 mg/dL (ref 8.4–10.5)
Chloride: 101 mEq/L (ref 96–112)
Creatinine, Ser: 2.03 mg/dL — ABNORMAL HIGH (ref 0.40–1.50)
GFR: 42.57 mL/min — ABNORMAL LOW (ref >60.00)
GLUCOSE: 357 mg/dL — AB (ref 70–99)
POTASSIUM: 4.1 meq/L (ref 3.5–5.1)
Sodium: 135 mEq/L (ref 135–145)

## 2016-07-21 LAB — MICROALBUMIN / CREATININE URINE RATIO
Creatinine,U: 111.2 mg/dL
Microalb Creat Ratio: 5.4 mg/g (ref 0.0–30.0)
Microalb, Ur: 6 mg/dL — ABNORMAL HIGH (ref 0.0–1.9)

## 2016-07-25 ENCOUNTER — Ambulatory Visit (INDEPENDENT_AMBULATORY_CARE_PROVIDER_SITE_OTHER): Payer: Medicare HMO | Admitting: Primary Care

## 2016-07-25 ENCOUNTER — Encounter: Payer: Self-pay | Admitting: Primary Care

## 2016-07-25 DIAGNOSIS — Z794 Long term (current) use of insulin: Secondary | ICD-10-CM

## 2016-07-25 DIAGNOSIS — E119 Type 2 diabetes mellitus without complications: Secondary | ICD-10-CM | POA: Diagnosis not present

## 2016-07-25 DIAGNOSIS — E1165 Type 2 diabetes mellitus with hyperglycemia: Secondary | ICD-10-CM

## 2016-07-25 MED ORDER — INSULIN DETEMIR 100 UNIT/ML ~~LOC~~ SOLN: 25.0000 [IU] | Freq: Two times a day (BID) | SUBCUTANEOUS | 0 refills | Status: DC

## 2016-07-25 NOTE — Progress Notes (Signed)
Subjective:    Patient ID: Jonathan Page, male    DOB: 10/18/1950, 66 y.o.   MRN: [id number]  HPI  Jonathan Page is a 66 year old male with a history of type 2 diabetes who presents today for follow up.  1) Type 2 Diabetes: Recent A1C of 13.1 which was a significant increase from 7.9 in November 2017. He also presented to [facility] ED on 07/07/16 with reports of hyperglycemia in the 300-400 range. He was treated at [facility] with IV fluids and sent home later that day after improvement in blood sugar readings. He's currently managed on Levemir 20 units AM and HS.  Since his last visit he's lost weight, he's compliant to his insulin. He started to work on improvement in his diet about 1 month ago. He's cut back on fried food, he's eating more yogurt, fruit, whole eat bread, boiled eggs, and is cutting back on portions. He's checking his sugars several times daily (Fasting AM 300, after meals 310-320). He's been running in the 300 range for the past 1 month. He's exercising on his treadmill daily.  He denies dizziness, chest pain, visual changes. He's feeling good.  2) Hyperlipidemia: Currently managed on atorvastatin 40 mg. Recent lipid with trigs at 380. TC and LDL at goal. He's taking Fish Oil 1000 mg once daily.   Review of Systems  Eyes: Negative for visual disturbance.  Respiratory: Negative for shortness of breath.   Cardiovascular: Negative for chest pain.  Neurological: Negative for dizziness and numbness.       Past Medical History:  Diagnosis Date  . Concussion syndrome   . Diabetes ([place])   . Frequent headaches   . Hypertension      Social History   Social History  . Marital status: Divorced    Spouse name: N/A  . Number of children: N/A  . Years of education: N/A   Occupational History  . Not on file.   Social History Main Topics  . Smoking status: Light Tobacco Smoker    Types: Cigarettes  . Smokeless tobacco: Never Used  . Alcohol use 0.0 oz/week     Comment:  occasional beer  . Drug use: No  . Sexual activity: Not on file   Other Topics Concern  . Not on file   Social History Narrative   Divorced.   2 children, 1 grandchild.   Retired. Once worked as a Catering manager.    Enjoys relaxing.     Past Surgical History:  Procedure Laterality Date  . APPENDECTOMY  1960    Family History  Problem Relation Age of Onset  . Diabetes Father   . Diabetes Paternal Grandfather   . Diabetes Paternal Grandmother     No Known Allergies  Current Outpatient Prescriptions on File Prior to Visit  Medication Sig Dispense Refill  . Alcohol Swabs (B-D SINGLE USE SWABS REGULAR) PADS Use to test blood sugar 3 times daily 300 each 3  . aspirin EC 81 MG tablet Take 81 mg by mouth daily.    Marland Kitchen atorvastatin (LIPITOR) 40 MG tablet Take 1 tablet (40 mg total) by mouth daily. 90 tablet 1  . Blood Glucose Calibration (TRUE METRIX LEVEL 1) Low SOLN Use to test blood sugar 3 times daily 3 each 3  . Blood Glucose Monitoring Suppl (TRUE METRIX METER) DEVI 1 Device by Does not apply route 3 (three) times daily. Use to test blood sugar 3 times daily 1 Device 0  . glipiZIDE (GLUCOTROL) 10  MG tablet Take 1 tablet (10 mg total) by mouth 2 (two) times daily before a meal. 180 tablet 1  . glucose blood (TRUE METRIX BLOOD GLUCOSE TEST) test strip Use to test blood sugar 3 times daily 300 each 3  . lisinopril-hydrochlorothiazide (PRINZIDE,ZESTORETIC) 20-25 MG tablet Take 1 tablet by mouth daily. 90 tablet 3  . TRUEPLUS LANCETS 33G MISC Use to test blood sugar 3 times daily 300 each 3   No current facility-administered medications on file prior to visit.     BP 136/86   Pulse 88   Temp 98 F (36.7 C) (Oral)   Ht 6\' 1"  (1.854 m)   Wt 217 lb 12.8 oz (98.8 kg)   SpO2 94%   BMI 28.74 kg/m    Objective:   Physical Exam  Constitutional: He appears well-nourished.  Neck: Neck supple.  Cardiovascular: Normal rate and regular rhythm.   Pulmonary/Chest: Effort normal  and breath sounds normal.  Skin: Skin is warm and dry.          Assessment & Plan:

## 2016-07-25 NOTE — Patient Instructions (Signed)
Increase your Levemir to 25 units in the morning and 25 units at bedtime.  Increase Fish Oil to 1000 mg three times daily with meals.  Continue to monitor your sugars and notify me of readings below 110 or above 300.  We will call you in 2 weeks for your readings.  Schedule a lab only appointment in 3 months to have your A1C and cholesterol re-checked, ensure you are fasting 8 hours prior to your appointment.  Continue to work on diet and exercise.  It was a pleasure to see you today!  Food Choices to Lower Your Triglycerides Triglycerides are a type of fat in your blood. High levels of triglycerides can increase the risk of heart disease and stroke. If your triglyceride levels are high, the foods you eat and your eating habits are very important. Choosing the right foods can help lower your triglycerides. What general guidelines do I need to follow?  Lose weight if you are overweight.  Limit or avoid alcohol.  Fill one half of your plate with vegetables and green salads.  Limit fruit to two servings a day. Choose fruit instead of juice.  Make one fourth of your plate whole grains. Look for the word "whole" as the first word in the ingredient list.  Fill one fourth of your plate with lean protein foods.  Enjoy fatty fish (such as salmon, mackerel, sardines, and tuna) three times a week.  Choose healthy fats.  Limit foods high in starch and sugar.  Eat more home-cooked food and less restaurant, buffet, and fast food.  Limit fried foods.  Cook foods using methods other than frying.  Limit saturated fats.  Check ingredient lists to avoid foods with partially hydrogenated oils (trans fats) in them. What foods can I eat? Grains  Whole grains, such as whole wheat or whole grain breads, crackers, cereals, and pasta. Unsweetened oatmeal, bulgur, barley, quinoa, or brown rice. Corn or whole wheat flour tortillas. Vegetables  Fresh or frozen vegetables (raw, steamed, roasted, or  grilled). Green salads. Fruits  All fresh, canned (in natural juice), or frozen fruits. Meat and Other Protein Products  Ground beef (85% or leaner), grass-fed beef, or beef trimmed of fat. Skinless chicken or [place]. Ground chicken or [place]. Pork trimmed of fat. All fish and seafood. Eggs. Dried beans, peas, or lentils. Unsalted nuts or seeds. Unsalted canned or dry beans. Dairy  Low-fat dairy products, such as skim or 1% milk, 2% or reduced-fat cheeses, low-fat ricotta or cottage cheese, or plain low-fat yogurt. Fats and Oils  Tub margarines without trans fats. Light or reduced-fat mayonnaise and salad dressings. Avocado. Safflower, olive, or canola oils. Natural peanut or almond butter. The items listed above may not be a complete list of recommended foods or beverages. Contact your dietitian for more options.  What foods are not recommended? Grains  White bread. White pasta. White rice. Cornbread. Bagels, pastries, and croissants. Crackers that contain trans fat. Vegetables  White potatoes. Corn. Creamed or fried vegetables. Vegetables in a cheese sauce. Fruits  Dried fruits. Canned fruit in light or heavy syrup. Fruit juice. Meat and Other Protein Products  Fatty cuts of meat. Ribs, chicken wings, bacon, sausage, bologna, salami, chitterlings, fatback, hot dogs, bratwurst, and packaged luncheon meats. Dairy  Whole or 2% milk, cream, half-and-half, and cream cheese. Whole-fat or sweetened yogurt. Full-fat cheeses. Nondairy creamers and whipped toppings. Processed cheese, cheese spreads, or cheese curds. Sweets and Desserts  Corn syrup, sugars, honey, and molasses. Candy. Jam and jelly.  Syrup. Sweetened cereals. Cookies, pies, cakes, donuts, muffins, and ice cream. Fats and Oils  Butter, stick margarine, lard, shortening, ghee, or bacon fat. Coconut, palm kernel, or palm oils. Beverages  Alcohol. Sweetened drinks (such as sodas, lemonade, and fruit drinks or punches). The items listed  above may not be a complete list of foods and beverages to avoid. Contact your dietitian for more information.  This information is not intended to replace advice given to you by your health care provider. Make sure you discuss any questions you have with your health care provider. Document Released: 03/17/2004 Document Revised: 11/05/2015 Document Reviewed: 04/03/2013 Elsevier Interactive Patient Education  2017 [employer].

## 2016-07-25 NOTE — Assessment & Plan Note (Signed)
Increase in A1C from 7.9 to 13.1. Poor diet 1 month ago, recently improved. Sugars running 300's most of the day. Discussed diet again, regular exercise, seems to be compliant to both. Unsure of sudden increase in sugars but will treat. Increase Levemir to 25 units BID. Will call for sugar readings in several weeks. Follow up in 3 months.

## 2016-07-25 NOTE — Progress Notes (Signed)
Pre visit review using our clinic review tool, if applicable. No additional management support is needed unless otherwise documented below in the visit note. 

## 2016-09-15 ENCOUNTER — Other Ambulatory Visit: Payer: Self-pay | Admitting: Primary Care

## 2016-09-15 MED ORDER — ATORVASTATIN CALCIUM 40 MG PO TABS: 40.0000 mg | ORAL_TABLET | Freq: Every day | ORAL | 0 refills | Status: DC

## 2016-09-15 NOTE — Telephone Encounter (Signed)
Received faxed refill request for atorvastatin (LIPITOR) 40 MG tablet. Last prescribed on 11/27/2015. Last seen on 07/25/2016.  Will refill as requested.

## 2016-10-14 ENCOUNTER — Observation Stay (HOSPITAL_COMMUNITY)
Admission: EM | Admit: 2016-10-14 | Discharge: 2016-10-16 | Disposition: A | Discharge status: Home / Self Care | Payer: Medicare HMO | Attending: Internal Medicine | Admitting: Internal Medicine

## 2016-10-14 ENCOUNTER — Emergency Department (HOSPITAL_COMMUNITY): Payer: Medicare HMO

## 2016-10-14 ENCOUNTER — Encounter (HOSPITAL_COMMUNITY): Payer: Self-pay | Admitting: Emergency Medicine

## 2016-10-14 DIAGNOSIS — R9431 Abnormal electrocardiogram [ECG] [EKG]: Secondary | ICD-10-CM | POA: Diagnosis present

## 2016-10-14 DIAGNOSIS — F0781 Postconcussional syndrome: Secondary | ICD-10-CM | POA: Insufficient documentation

## 2016-10-14 DIAGNOSIS — Z794 Long term (current) use of insulin: Secondary | ICD-10-CM | POA: Insufficient documentation

## 2016-10-14 DIAGNOSIS — I4510 Unspecified right bundle-branch block: Secondary | ICD-10-CM | POA: Insufficient documentation

## 2016-10-14 DIAGNOSIS — R55 Syncope and collapse: Secondary | ICD-10-CM | POA: Diagnosis not present

## 2016-10-14 DIAGNOSIS — N183 Chronic kidney disease, stage 3 (moderate): Secondary | ICD-10-CM | POA: Insufficient documentation

## 2016-10-14 DIAGNOSIS — E119 Type 2 diabetes mellitus without complications: Secondary | ICD-10-CM | POA: Diagnosis present

## 2016-10-14 DIAGNOSIS — E1121 Type 2 diabetes mellitus with diabetic nephropathy: Secondary | ICD-10-CM | POA: Diagnosis present

## 2016-10-14 DIAGNOSIS — F17210 Nicotine dependence, cigarettes, uncomplicated: Secondary | ICD-10-CM | POA: Diagnosis not present

## 2016-10-14 DIAGNOSIS — E785 Hyperlipidemia, unspecified: Secondary | ICD-10-CM | POA: Diagnosis not present

## 2016-10-14 DIAGNOSIS — Z9049 Acquired absence of other specified parts of digestive tract: Secondary | ICD-10-CM | POA: Diagnosis not present

## 2016-10-14 DIAGNOSIS — R739 Hyperglycemia, unspecified: Secondary | ICD-10-CM

## 2016-10-14 DIAGNOSIS — D631 Anemia in chronic kidney disease: Secondary | ICD-10-CM | POA: Insufficient documentation

## 2016-10-14 DIAGNOSIS — I129 Hypertensive chronic kidney disease with stage 1 through stage 4 chronic kidney disease, or unspecified chronic kidney disease: Secondary | ICD-10-CM | POA: Diagnosis not present

## 2016-10-14 DIAGNOSIS — Z79899 Other long term (current) drug therapy: Secondary | ICD-10-CM | POA: Diagnosis not present

## 2016-10-14 DIAGNOSIS — N179 Acute kidney failure, unspecified: Secondary | ICD-10-CM | POA: Insufficient documentation

## 2016-10-14 DIAGNOSIS — Z7982 Long term (current) use of aspirin: Secondary | ICD-10-CM | POA: Diagnosis not present

## 2016-10-14 DIAGNOSIS — R930 Abnormal findings on diagnostic imaging of skull and head, not elsewhere classified: Secondary | ICD-10-CM | POA: Insufficient documentation

## 2016-10-14 DIAGNOSIS — E870 Hyperosmolality and hypernatremia: Secondary | ICD-10-CM | POA: Insufficient documentation

## 2016-10-14 DIAGNOSIS — E1151 Type 2 diabetes mellitus with diabetic peripheral angiopathy without gangrene: Secondary | ICD-10-CM | POA: Diagnosis not present

## 2016-10-14 DIAGNOSIS — E860 Dehydration: Secondary | ICD-10-CM | POA: Diagnosis present

## 2016-10-14 DIAGNOSIS — E871 Hypo-osmolality and hyponatremia: Secondary | ICD-10-CM | POA: Diagnosis present

## 2016-10-14 DIAGNOSIS — E1165 Type 2 diabetes mellitus with hyperglycemia: Secondary | ICD-10-CM | POA: Insufficient documentation

## 2016-10-14 DIAGNOSIS — Z833 Family history of diabetes mellitus: Secondary | ICD-10-CM | POA: Insufficient documentation

## 2016-10-14 DIAGNOSIS — T671XXA Heat syncope, initial encounter: Secondary | ICD-10-CM

## 2016-10-14 DIAGNOSIS — E1122 Type 2 diabetes mellitus with diabetic chronic kidney disease: Secondary | ICD-10-CM | POA: Diagnosis not present

## 2016-10-14 DIAGNOSIS — I10 Essential (primary) hypertension: Secondary | ICD-10-CM | POA: Diagnosis present

## 2016-10-14 LAB — COMPREHENSIVE METABOLIC PANEL
ALK PHOS: 63 U/L (ref 38–126)
ALT: 24 U/L (ref 17–63)
AST: 27 U/L (ref 15–41)
Albumin: 3.7 g/dL (ref 3.5–5.0)
Anion gap: 15 (ref 5–15)
BUN: 35 mg/dL — ABNORMAL HIGH (ref 6–20)
CALCIUM: 9 mg/dL (ref 8.9–10.3)
CO2: 20 mmol/L — ABNORMAL LOW (ref 22–32)
CREATININE: 2.6 mg/dL — AB (ref 0.61–1.24)
Chloride: 95 mmol/L — ABNORMAL LOW (ref 101–111)
GFR calc non Af Amer: 24 mL/min — ABNORMAL LOW (ref >60)
GFR, EST AFRICAN AMERICAN: 28 mL/min — AB (ref >60)
Glucose, Bld: 520 mg/dL (ref 65–99)
Potassium: 3.8 mmol/L (ref 3.5–5.1)
Sodium: 130 mmol/L — ABNORMAL LOW (ref 135–145)
Total Bilirubin: 0.7 mg/dL (ref 0.3–1.2)
Total Protein: 6.2 g/dL — ABNORMAL LOW (ref 6.5–8.1)

## 2016-10-14 LAB — RAPID URINE DRUG SCREEN, HOSP PERFORMED
Amphetamines: NOT DETECTED
Barbiturates: NOT DETECTED
Benzodiazepines: NOT DETECTED
Cocaine: NOT DETECTED
OPIATES: NOT DETECTED
TETRAHYDROCANNABINOL: NOT DETECTED

## 2016-10-14 LAB — GLUCOSE, CAPILLARY
GLUCOSE-CAPILLARY: 297 mg/dL — AB (ref 65–99)
Glucose-Capillary: 246 mg/dL — ABNORMAL HIGH (ref 65–99)
Glucose-Capillary: 282 mg/dL — ABNORMAL HIGH (ref 65–99)

## 2016-10-14 LAB — URINALYSIS, ROUTINE W REFLEX MICROSCOPIC
BILIRUBIN URINE: NEGATIVE
Glucose, UA: >=500 mg/dL — AB
Ketones, ur: 5 mg/dL — AB
LEUKOCYTES UA: NEGATIVE
NITRITE: NEGATIVE
PH: 5 (ref 5.0–8.0)
Protein, ur: NEGATIVE mg/dL
SPECIFIC GRAVITY, URINE: 1.018 (ref 1.005–1.030)
Squamous Epithelial / LPF: NONE SEEN

## 2016-10-14 LAB — CBG MONITORING, ED
GLUCOSE-CAPILLARY: 295 mg/dL — AB (ref 65–99)
Glucose-Capillary: 470 mg/dL — ABNORMAL HIGH (ref 65–99)

## 2016-10-14 LAB — CK: CK TOTAL: 248 U/L (ref 49–397)

## 2016-10-14 LAB — CBC
HCT: 38 % — ABNORMAL LOW (ref 39.0–52.0)
Hemoglobin: 13.1 g/dL (ref 13.0–17.0)
MCH: 29.6 pg (ref 26.0–34.0)
MCHC: 34.5 g/dL (ref 30.0–36.0)
MCV: 86 fL (ref 78.0–100.0)
PLATELETS: 161 10*3/uL (ref 150–400)
RBC: 4.42 MIL/uL (ref 4.22–5.81)
RDW: 12.6 % (ref 11.5–15.5)
WBC: 9.2 10*3/uL (ref 4.0–10.5)

## 2016-10-14 LAB — DIFFERENTIAL
BASOS PCT: 0 %
Basophils Absolute: 0 10*3/uL (ref 0.0–0.1)
Eosinophils Absolute: 0 10*3/uL (ref 0.0–0.7)
Eosinophils Relative: 0 %
LYMPHS PCT: 14 %
Lymphs Abs: 1.3 10*3/uL (ref 0.7–4.0)
MONO ABS: 0.4 10*3/uL (ref 0.1–1.0)
Monocytes Relative: 4 %
Neutro Abs: 7.5 10*3/uL (ref 1.7–7.7)
Neutrophils Relative %: 82 %

## 2016-10-14 LAB — MAGNESIUM: Magnesium: 2.5 mg/dL — ABNORMAL HIGH (ref 1.7–2.4)

## 2016-10-14 LAB — ETHANOL: Alcohol, Ethyl (B): <5 mg/dL (ref <5)

## 2016-10-14 LAB — TROPONIN I

## 2016-10-14 MED ORDER — POTASSIUM CHLORIDE IN NACL 20-0.9 MEQ/L-% IV SOLN
INTRAVENOUS | Status: DC
  Administered 2016-10-14 (×2): via INTRAVENOUS
  Filled 2016-10-14 (×4): qty 1000

## 2016-10-14 MED ORDER — ENOXAPARIN SODIUM 40 MG/0.4ML ~~LOC~~ SOLN: 40.0000 mg | SUBCUTANEOUS | Status: DC

## 2016-10-14 MED ORDER — SODIUM CHLORIDE 0.9% FLUSH: 3.0000 mL | Freq: Two times a day (BID) | INTRAVENOUS | Status: DC

## 2016-10-14 MED ORDER — SODIUM CHLORIDE 0.9 % IV BOLUS (SEPSIS)
1000.0000 mL | Freq: Once | INTRAVENOUS | Status: AC
  Administered 2016-10-14: 1000 mL via INTRAVENOUS

## 2016-10-14 MED ORDER — SODIUM CHLORIDE 0.9% FLUSH
3.0000 mL | Freq: Two times a day (BID) | INTRAVENOUS | Status: DC
  Administered 2016-10-14 – 2016-10-16 (×4): 3 mL via INTRAVENOUS

## 2016-10-14 MED ORDER — INSULIN ASPART 100 UNIT/ML ~~LOC~~ SOLN
10.0000 [IU] | Freq: Once | SUBCUTANEOUS | Status: AC
  Administered 2016-10-14: 10 [IU] via INTRAVENOUS
  Filled 2016-10-14: qty 1

## 2016-10-14 MED ORDER — ASPIRIN EC 81 MG PO TBEC
81.0000 mg | DELAYED_RELEASE_TABLET | Freq: Every day | ORAL | Status: DC
  Administered 2016-10-14 – 2016-10-16 (×3): 81 mg via ORAL
  Filled 2016-10-14 (×3): qty 1

## 2016-10-14 MED ORDER — ENOXAPARIN SODIUM 30 MG/0.3ML ~~LOC~~ SOLN
30.0000 mg | SUBCUTANEOUS | Status: DC
  Administered 2016-10-14: 30 mg via SUBCUTANEOUS
  Filled 2016-10-14: qty 0.3

## 2016-10-14 MED ORDER — INSULIN DETEMIR 100 UNIT/ML ~~LOC~~ SOLN
25.0000 [IU] | Freq: Two times a day (BID) | SUBCUTANEOUS | Status: DC
  Administered 2016-10-14 – 2016-10-15 (×3): 25 [IU] via SUBCUTANEOUS
  Filled 2016-10-14 (×4): qty 0.25

## 2016-10-14 MED ORDER — ASPIRIN EC 81 MG PO TBEC: 81.0000 mg | DELAYED_RELEASE_TABLET | Freq: Every day | ORAL | Status: DC

## 2016-10-14 MED ORDER — HYDROCODONE-ACETAMINOPHEN 5-325 MG PO TABS: 1.0000 | ORAL_TABLET | ORAL | Status: DC | PRN

## 2016-10-14 MED ORDER — SENNOSIDES-DOCUSATE SODIUM 8.6-50 MG PO TABS: 1.0000 | ORAL_TABLET | Freq: Every evening | ORAL | Status: DC | PRN

## 2016-10-14 MED ORDER — ZOLPIDEM TARTRATE 5 MG PO TABS: 5.0000 mg | ORAL_TABLET | Freq: Every evening | ORAL | Status: DC | PRN

## 2016-10-14 MED ORDER — KETOROLAC TROMETHAMINE 15 MG/ML IJ SOLN: 15.0000 mg | Freq: Four times a day (QID) | INTRAMUSCULAR | Status: DC | PRN

## 2016-10-14 MED ORDER — FENTANYL CITRATE (PF) 100 MCG/2ML IJ SOLN
25.0000 ug | Freq: Once | INTRAMUSCULAR | Status: AC
  Administered 2016-10-14: 25 ug via INTRAVENOUS
  Filled 2016-10-14: qty 2

## 2016-10-14 MED ORDER — INSULIN ASPART 100 UNIT/ML ~~LOC~~ SOLN
0.0000 [IU] | Freq: Every day | SUBCUTANEOUS | Status: DC
  Administered 2016-10-14: 3 [IU] via SUBCUTANEOUS

## 2016-10-14 MED ORDER — ACETAMINOPHEN 325 MG PO TABS
650.0000 mg | ORAL_TABLET | Freq: Four times a day (QID) | ORAL | Status: DC | PRN
  Administered 2016-10-15: 650 mg via ORAL
  Filled 2016-10-14: qty 2

## 2016-10-14 MED ORDER — ATORVASTATIN CALCIUM 40 MG PO TABS
40.0000 mg | ORAL_TABLET | Freq: Every day | ORAL | Status: DC
  Administered 2016-10-14 – 2016-10-16 (×3): 40 mg via ORAL
  Filled 2016-10-14 (×3): qty 1

## 2016-10-14 MED ORDER — MAGNESIUM SULFATE 2 GM/50ML IV SOLN
2.0000 g | Freq: Once | INTRAVENOUS | Status: AC
  Administered 2016-10-14: 2 g via INTRAVENOUS
  Filled 2016-10-14: qty 50

## 2016-10-14 MED ORDER — ACETAMINOPHEN 650 MG RE SUPP: 650.0000 mg | Freq: Four times a day (QID) | RECTAL | Status: DC | PRN

## 2016-10-14 MED ORDER — IBUPROFEN 400 MG PO TABS
400.0000 mg | ORAL_TABLET | Freq: Once | ORAL | Status: AC
  Administered 2016-10-14: 400 mg via ORAL
  Filled 2016-10-14: qty 1

## 2016-10-14 MED ORDER — INSULIN ASPART 100 UNIT/ML ~~LOC~~ SOLN
0.0000 [IU] | Freq: Three times a day (TID) | SUBCUTANEOUS | Status: DC
  Administered 2016-10-14: 8 [IU] via SUBCUTANEOUS
  Administered 2016-10-15: 5 [IU] via SUBCUTANEOUS

## 2016-10-14 NOTE — Progress Notes (Signed)
Inpatient Diabetes Program Recommendations  AACE/ADA: New Consensus Statement on Inpatient Glycemic Control (2015)  Target Ranges:  Prepandial:   less than 140 mg/dL      Peak postprandial:   less than 180 mg/dL (1-2 hours)      Critically ill patients:  140 - 180 mg/dL   Lab Results  Component Value Date   GLUCAP 246 (H) 10/14/2016   HGBA1C 13.1 Repeated and verified X2. (H) 07/21/2016    Review of Glycemic Control  Diabetes history: DM2 Outpatient Diabetes medications: Levemir 25 units bid Current orders for Inpatient glycemic control: Lantus 25 units bid  Last HgbA1C 13.1 approx 3 months ago. Need updated HgbA1C.  Inpatient Diabetes Program Recommendations:    Add Novolog 0-15 units tidwc and hs Check HgbA1C to assess glycemic control prior to admission.  Will follow. Thank you. Lorenda Peck, RD, LDN, CDE Inpatient Diabetes Coordinator [phone]

## 2016-10-14 NOTE — ED Triage Notes (Signed)
Pt coming from home. Pt currently present with Involuntary tremors to bilateral sides. Pt wwasmowing the grass yesterday and had a syncopal episode after getting off the riding lawn mower. Pt did strike the left side of his head and there is a hematoma noted to it.Pt pupils are restricted but reacted. Pt not orthostatic. Pt does not have vertigo. Today pt was attempting to ambulate to the bathroom when his right side gave out from him when he was trying to go to the bathroom and hit the wall and slid down to the floor. No n/v/d. No CP, no neck pain, pt does have dizziness with movement.  A&O x4  566 CBG and 500 bolus with EMS.  Incontinent on the floor at home only because he laid on the floor for 3 hrs. Pt GF called 911.  18 G LFA

## 2016-10-14 NOTE — ED Provider Notes (Signed)
[facility] DEPT Provider Note   CSN: [id number] Arrival date & time: 10/14/16  0604     History   Chief Complaint Chief Complaint  Patient presents with  . Fall  . Hyperglycemia  . Loss of Consciousness    HPI Jonathan Page is a 66 y.o. male.  The history is provided by the patient and a significant other.  Fall  This is a new problem. The current episode started yesterday. The problem has been gradually improving. Associated symptoms include headaches. Pertinent negatives include no chest pain, no abdominal pain and no shortness of breath. The symptoms are aggravated by walking. The symptoms are relieved by rest.  Hyperglycemia  Associated symptoms: syncope   Associated symptoms: no abdominal pain, no chest pain, no fever, no shortness of breath and no vomiting   Loss of Consciousness   This is a new problem. The current episode started yesterday. The problem has been gradually improving. He lost consciousness for a period of 1 to 5 minutes. Associated with: mowing lawn. Associated symptoms include headaches. Pertinent negatives include abdominal pain, chest pain, fever, visual change and vomiting. He has tried nothing for the symptoms. His past medical history is significant for HTN. His past medical history does not include CAD or CVA.  pt reports he was mowing lawn yesterday for several hours (>80 degrees yesterday) and when he stood up off mower he had syncopal event.  He hit his head He woke up with HA and dizziness  Tonight after sleeping, he woke up to go to bathroom and had difficulty walking and slumped against  The wall - no LOC or new injury He was in the floor for 3 hrs until help arrived He reports involuntary movement of both arms No h/o seizure but does have h/o concussion in past while at [place] in [place] No new meds He had been well prior to this episode yesterday   Past Medical History:  Diagnosis Date  . Concussion syndrome   . Diabetes  ([place])   . Frequent headaches   . Hypertension     Patient Active Problem List   Diagnosis Date Noted  . Hyperlipidemia 01/25/2016  . Renal failure 01/25/2016  . Essential hypertension 10/27/2015  . Type 2 diabetes mellitus with hyperglycemia ([place]) 10/22/2015    Past Surgical History:  Procedure Laterality Date  . APPENDECTOMY  1960       Home Medications    Prior to Admission medications   Medication Sig Start Date End Date Taking? Authorizing Provider  atorvastatin (LIPITOR) 40 MG tablet Take 1 tablet (40 mg total) by mouth daily. 09/15/16  Yes [name], NP  insulin detemir (LEVEMIR) 100 UNIT/ML injection Inject 0.25 mLs (25 Units total) into the skin 2 (two) times daily. 07/25/16  Yes [name], NP  lisinopril-hydrochlorothiazide (PRINZIDE,ZESTORETIC) 20-25 MG tablet Take 1 tablet by mouth daily. 11/27/15  Yes [name], NP  Alcohol Swabs (B-D SINGLE USE SWABS REGULAR) PADS Use to test blood sugar 3 times daily 11/27/15   [name], NP  aspirin EC 81 MG tablet Take 81 mg by mouth daily.    Historical Provider, MD  Blood Glucose Calibration (TRUE METRIX LEVEL 1) Low SOLN Use to test blood sugar 3 times daily 11/27/15   [name], NP  Blood Glucose Monitoring Suppl (TRUE METRIX METER) DEVI 1 Device by Does not apply route 3 (three) times daily. Use to test blood sugar 3 times daily 11/27/15  [name], NP  glucose blood (TRUE METRIX BLOOD GLUCOSE TEST) test strip Use to test blood sugar 3 times daily 11/27/15   [name], NP  TRUEPLUS LANCETS 33G MISC Use to test blood sugar 3 times daily 11/27/15   [name], NP    Family History Family History  Problem Relation Age of Onset  . Diabetes Father   . Diabetes Paternal Grandfather   . Diabetes Paternal Grandmother     Social History Social History  Substance Use Topics  . Smoking status: Light Tobacco Smoker    Types: Cigarettes  . Smokeless tobacco: Never Used  .  Alcohol use 0.0 oz/week     Comment: occasional beer     Allergies   Patient has no known allergies.   Review of Systems Review of Systems  Constitutional: Negative for fever.  Respiratory: Negative for shortness of breath.   Cardiovascular: Positive for syncope. Negative for chest pain.  Gastrointestinal: Negative for abdominal pain and vomiting.  Neurological: Positive for headaches.  All other systems reviewed and are negative.    Physical Exam Updated Vital Signs BP 114/69 (BP Location: Right Arm)   Pulse 99   Temp 97.8 F (36.6 C) (Oral)   Resp (!) 22   SpO2 99%   Physical Exam CONSTITUTIONAL: Well developed/well nourished HEAD: hematoma to posterior scalp, otherwise no signs of head trauma EYES: EOMI/PERRL, no nystagmus ENMT: Mucous membranes moist NECK: supple no meningeal signs SPINE/BACK:entire spine nontender CV: S1/S2 noted, no murmurs/rubs/gallops noted LUNGS: Lungs are clear to auscultation bilaterally, no apparent distress ABDOMEN: soft, nontender, no rebound or guarding, bowel sounds noted throughout abdomen GU:no cva tenderness NEURO: Pt is awake/alert/appropriate, moves all extremitiesx4.  No facial droop.  No arm/leg drift.  He will have brief episodes of involuntary jerking of each arm, he has normal mental status throughout EXTREMITIES: pulses normal/equal, full ROM SKIN: warm, color normal PSYCH: no abnormalities of mood noted, alert and oriented to situation   ED Treatments / Results  Labs (all labs ordered are listed, but only abnormal results are displayed) Labs Reviewed  CBG MONITORING, ED - Abnormal; Notable for the following:       Result Value   Glucose-Capillary >600 (*)    All other components within normal limits  ETHANOL  CBC  DIFFERENTIAL  COMPREHENSIVE METABOLIC PANEL  RAPID URINE DRUG SCREEN, HOSP PERFORMED  URINALYSIS, ROUTINE W REFLEX MICROSCOPIC  TROPONIN I  CK  CK    EKG  EKG Interpretation  Date/Time:  Friday  Oct 14 2016 06:14:54 EDT Ventricular Rate:  102 PR Interval:    QRS Duration: 148 QT Interval:  389 QTC Calculation: 507 R Axis:   81 Text Interpretation:  Sinus tachycardia Right bundle branch block No significant change since last tracing Confirmed by Ripley Fraise [phone]) on 10/14/2016 6:46:50 AM       Radiology No results found.  Procedures Procedures (including critical care time)  Medications Ordered in ED Medications - No data to display   Initial Impression / Assessment and Plan / ED Course  I have reviewed the triage vital signs and the nursing notes.  Pertinent labs  results that were available during my care of the patient were reviewed by me and considered in my medical decision making (see chart for details).     7:15 AM  Pt in the ED for syncopal episode when he hit his head after standing up after mowing lawn Since he has had HA/dizziness and difficulty walking  with intermittent episodes of arm jerking Will need labs/imaging D/w dr Dayna Barker at signout I anticipate admission  Final Clinical Impressions(s) / ED Diagnoses   Final diagnoses:  Hyperglycemia  Dehydration  Heat syncope, initial encounter    New Prescriptions New Prescriptions   No medications on file     Ripley Fraise, MD 10/14/16 [phone]

## 2016-10-14 NOTE — ED Notes (Signed)
Pt states having headache requesting pain med.Benjamine Mola RN informed.

## 2016-10-14 NOTE — H&P (Signed)
History and Physical    Jonathan Page HQI:[id number] DOB: 06/26/1950 DOA: 10/14/2016  PCP: Sheral Flow, NP (Confirm with patient/family/NH records and if not entered, this has to be entered at [facility] point of entry) Patient coming from: home  I have personally briefly reviewed patient's old medical records in [place]  Chief Complaint: syncope after being outside in heat long period of time yesterday  HPI: Jonathan Page is a 66 y.o. male with medical history significant of diabetes, concussion syndrome and hypertension who presented to the emergency department with an episode of syncope. He had been mowing the lawn yesterday and felt weak afterwards when he got off the mower he passed out. He said he had been drinking some while mowing the lawn but didn't have his usual snacks and food as well as drinks with him. He went back in the house and seemed to do okay thing started feeling some one-sided weakness in bilateral tremors when he woke up in the middle of night. He went to the bathroom and he fell on the floor but did not pass out. He was unable to get up. He was on the ground for about 3 hours. His weakness and numbness persisted for that time. He had no associated dizziness he did complain of a mild headache. He has received some IV fluids in the emergency department he started feeling better. He was also noted to have associated signs of a markedly elevated blood glucose greater than 500 on presentation. After 1 L of fluid it has improved to 470. Laboratory blood work in the emergency department revealed profound dehydration. His baseline creatinine runs about 1.6 and is currently at 2.03 reflecting a significant amount of dehydration. He does have CK D stage IV.   ED Course: Patient was admitted to the emergency department and IV fluids were administered. He had blood work done which showed markedly elevated creatinine, markedly elevated blood glucose, and hyponatremia. He also  has had some bilateral arm tremors and weakness on one side right arm and leg numbness. EKG revealed an increased QTc. Initial blood glucose greater than 600 when rechecked after IV fluid had come down to 472. No anion gap.  Review of Systems:   ROS  Review of Systems - Review of Systems - History obtained from the patient General ROS: positive for  - weight loss and dizziness Psychological ROS: negative for - anxiety, behavioral disorder or depression ENT ROS: negative for - hearing change, nasal congestion or nasal discharge Hematological and Lymphatic ROS: negative for - blood clots or blood transfusions Endocrine ROS: negative for - mood swings, palpitations or skin changes Respiratory ROS: negative for - hemoptysis, orthopnea, pleuritic pain or shortness of breath Cardiovascular ROS: negative for - irregular heartbeat, murmur or orthopnea Gastrointestinal ROS: negative for - constipation, diarrhea or gas/bloating Musculoskeletal ROS: negative for - joint stiffness or joint swelling Neurological ROS: negative for - confusion, gait disturbance or impaired coordination/balance   ADDITIONAL INFORMATION/CONCERNS: Past Medical History:  Diagnosis Date  . Concussion syndrome   . Diabetes ([place])   . Frequent headaches   . Hypertension     Past Surgical History:  Procedure Laterality Date  . APPENDECTOMY  1960     reports that he has been smoking Cigarettes.  He has never used smokeless tobacco. He reports that he drinks alcohol. He reports that he does not use drugs.  No Known Allergies  Family History  Problem Relation Age of Onset  . Diabetes Father   .  Diabetes Paternal Grandfather   . Diabetes Paternal Grandmother      Prior to Admission medications   Medication Sig Start Date End Date Taking? Authorizing Provider  aspirin EC 81 MG tablet Take 81 mg by mouth daily.   Yes Historical Provider, MD  atorvastatin (LIPITOR) 40 MG tablet Take 1 tablet (40 mg total) by mouth  daily. 09/15/16  Yes [name], NP  insulin detemir (LEVEMIR) 100 UNIT/ML injection Inject 0.25 mLs (25 Units total) into the skin 2 (two) times daily. 07/25/16  Yes [name], NP  lisinopril-hydrochlorothiazide (PRINZIDE,ZESTORETIC) 20-25 MG tablet Take 1 tablet by mouth daily. 11/27/15  Yes [name], NP  Alcohol Swabs (B-D SINGLE USE SWABS REGULAR) PADS Use to test blood sugar 3 times daily 11/27/15   [name], NP  Blood Glucose Calibration (TRUE METRIX LEVEL 1) Low SOLN Use to test blood sugar 3 times daily 11/27/15   [name], NP  Blood Glucose Monitoring Suppl (TRUE METRIX METER) DEVI 1 Device by Does not apply route 3 (three) times daily. Use to test blood sugar 3 times daily 11/27/15   [name], NP  glucose blood (TRUE METRIX BLOOD GLUCOSE TEST) test strip Use to test blood sugar 3 times daily 11/27/15   [name], NP  TRUEPLUS LANCETS 33G MISC Use to test blood sugar 3 times daily 11/27/15   [name], NP    Physical Exam: Vitals:   10/14/16 0815 10/14/16 0830 10/14/16 0838 10/14/16 0900  BP: 108/75 114/73  113/73  Pulse: 99 99  (!) 105  Resp: 14 15  16   Temp:   97.7 F (36.5 C)   TempSrc:      SpO2: 93% 95%  96%    Constitutional: NAD, calm, comfortable Vitals:   10/14/16 0815 10/14/16 0830 10/14/16 0838 10/14/16 0900  BP: 108/75 114/73  113/73  Pulse: 99 99  (!) 105  Resp: 14 15  16   Temp:   97.7 F (36.5 C)   TempSrc:      SpO2: 93% 95%  96%   Eyes: PERRL, lids and conjunctivae normal ENMT: Mucous membranes are moist. Posterior pharynx clear of any exudate or lesions.Normal dentition.  Neck: normal, supple, no masses, no thyromegaly Respiratory: clear to auscultation bilaterally, no wheezing, no crackles. Normal respiratory effort. No accessory muscle use.  Cardiovascular: Regular rate and rhythm, no murmurs / rubs / gallops. No extremity edema. 2+ pedal pulses. No carotid bruits.  Abdomen: no tenderness, no  masses palpated. No hepatosplenomegaly. Bowel sounds positive.  Musculoskeletal: no clubbing / cyanosis. No joint deformity upper and lower extremities. Good ROM, no contractures. Normal muscle tone.  Skin: no rashes, lesions, ulcers. No induration Neurologic: CN 2-12 grossly intact. Sensation intact, DTR normal. Strength 5/5 in all 4.  Psychiatric: Normal judgment and insight. Alert and oriented x 3. Normal mood.     Labs on Admission: I have personally reviewed following labs and imaging studies  CBC:  Recent Labs Lab 10/14/16 0648  WBC 9.2  NEUTROABS 7.5  HGB 13.1  HCT 38.0*  MCV 86.0  PLT 417   Basic Metabolic Panel:  Recent Labs Lab 10/14/16 0755 10/14/16 1022  NA 130*  --   K 3.8  --   CL 95*  --   CO2 20*  --   GLUCOSE 520*  --   BUN 35*  --   CREATININE 2.60*  --   CALCIUM 9.0  --   MG  --  2.5*   GFR: CrCl cannot be calculated (Unknown ideal weight.). Liver Function Tests:  Recent Labs Lab 10/14/16 0755  AST 27  ALT 24  ALKPHOS 63  BILITOT 0.7  PROT 6.2*  ALBUMIN 3.7   No results for input(s): LIPASE, AMYLASE in the last 168 hours. No results for input(s): AMMONIA in the last 168 hours. Coagulation Profile: No results for input(s): INR, PROTIME in the last 168 hours. Cardiac Enzymes:  Recent Labs Lab 10/14/16 0755  CKTOTAL 248  TROPONINI <0.03   BNP (last 3 results) No results for input(s): PROBNP in the last 8760 hours. HbA1C: No results for input(s): HGBA1C in the last 72 hours. CBG:  Recent Labs Lab 10/14/16 0646 10/14/16 0933 10/14/16 1118  GLUCAP >600* 470* 295*   Lipid Profile: No results for input(s): CHOL, HDL, LDLCALC, TRIG, CHOLHDL, LDLDIRECT in the last 72 hours. Thyroid Function Tests: No results for input(s): TSH, T4TOTAL, FREET4, T3FREE, THYROIDAB in the last 72 hours. Anemia Panel: No results for input(s): VITAMINB12, FOLATE, FERRITIN, TIBC, IRON, RETICCTPCT in the last 72 hours. Urine analysis:    Component  Value Date/Time   COLORURINE STRAW (A) 10/14/2016 0706   APPEARANCEUR CLEAR 10/14/2016 0706   LABSPEC 1.018 10/14/2016 0706   PHURINE 5.0 10/14/2016 0706   GLUCOSEU >=500 (A) 10/14/2016 0706   HGBUR MODERATE (A) 10/14/2016 0706   BILIRUBINUR NEGATIVE 10/14/2016 0706   KETONESUR 5 (A) 10/14/2016 0706   PROTEINUR NEGATIVE 10/14/2016 0706   NITRITE NEGATIVE 10/14/2016 0706   LEUKOCYTESUR NEGATIVE 10/14/2016 0706    Radiological Exams on Admission: Ct Head Wo Contrast  Result Date: 10/14/2016 CLINICAL DATA:  Syncope with fall.  Tremors. EXAM: CT HEAD WITHOUT CONTRAST TECHNIQUE: Contiguous axial images were obtained from the base of the skull through the vertex without intravenous contrast. COMPARISON:  None. FINDINGS: Brain: There is mild diffuse atrophy. There is no intracranial mass, hemorrhage, extra-axial fluid collection, or midline shift. There is patchy small vessel disease in the centra semiovale bilaterally. Elsewhere gray-white compartments appear normal. No acute infarct evident. Vascular: There is no evident hyperdense vessel. There is no appreciable vascular calcification. Skull: The bony calvarium appears intact. There is a slight left parietal scalp hematoma. Sinuses/Orbits: There is mucosal thickening in several ethmoid air cells bilaterally. There is mucosal thickening in both maxillary antra, slightly more on the right than on the left. Other visualized paranasal sinuses are clear. Orbits appear symmetric bilaterally. Other: Visualized mastoid air cells are clear. On the initial CT image, the visualized left parotid appears slightly irregular and inhomogeneous in attenuation compared to the normal-appearing right side. IMPRESSION: Mild atrophy. Patchy periventricular small vessel disease. No intracranial mass, hemorrhage, or extra-axial fluid collection. No acute infarct. There is a slight left parietal scalp hematoma. Areas of paranasal sinus disease. On the initial slice, the  visualized left parotid gland appears somewhat irregular in contour and mildly inhomogeneous in attenuation. Significance of this finding is uncertain. If there is concern for pathology of the left parotid region, CT or MR of this area to further evaluate would be reasonable. Electronically Signed   By: Lowella Grip III M.D.   On: 10/14/2016 07:40    EKG: Independently reviewed. And consistent with prolonged QT  Assessment/Plan Principal Problem:   Syncope and collapse Active Problems:   Type 2 diabetes mellitus with hyperglycemia (HCC)   Essential hypertension   Prolonged QT interval   Hyponatremia   Dehydration  #1 syncope and collapse. Patient will be observed in the hospital with  the primary diagnosis of syncope and collapse.  #2 type 2 diabetes with hyperglycemia.  Patient with markedly elevated blood glucoses greater than 600 but no elevated anion gap. I believe that this can be mostly managed by IV fluids and intermittent insulin dosing. Will start him on a every 4 hours sliding scale of insulin will continue his home medications and give him IV fluids in bolus fashion. This will be in addition to his standing dose IV fluids. He will receive 3 L of IV fluids over the next 12 hours.  #3 essential hypertension. Patient with home medication of lisinopril hydrochlorothiazide which is contraindicated in patients with prolonged QT interval. For right now we'll hold his antihypertensive medications.  #4 prolonged QT interval on monitor and rehydrate patient recheck EKG in a.m.  #5 hypernatremia. Patient will receive IV fluid rehydration. I believe the hypernatremia is due to an osmolar gap. As is frequently common in patients with hyperglycemia his hyponatremia is related to that. Will recheck BMP in a.m. after significant hydration and control of blood glucoses.  #6 dehydration: Patients receive IV fluid resuscitation and should have significant improvement in his creatinine  overnight.  #7 acute kidney injury on chronic kidney injury.  Patient with acute on chronic renal insufficiency he has stage IV chronic renal insufficiency and will require IV fluid. If renal function does not improve by a.m. may require nephrology consultation.  #8  weakness and numbness of the right arm and leg. I feel this may be related to his profound hyperglycemia. If symptoms do not improve by a.m. would consider neurological consultation. He also has bilateral tremors which all suggest a metabolic etiology for his symptoms. He does not appear to have any acute symptoms consistent with a stroke except for this weakness which I believe is related to his hyperglycemia.    DVT prophylaxis:  Lovenox Code Status: Full code  Family Communication: I spoke with the patient and his mother on admission both are in agreement with plans for admission and prognosis diagnosis discussed with them.  Disposition Plan: Disposition to home hopefully in a.m.  Admission status: Observation   Lady Deutscher MD FACP [facility] Pager [phone]  If 7PM-7AM, please contact night-coverage [email or web address] Password [email or web address]  10/14/2016, 11:32 AM

## 2016-10-14 NOTE — ED Provider Notes (Signed)
9:09 AM Assumed care from Dr. Christy Gentles, please see their note for full history, physical and decision making until this point. In brief this is a 66 y.o. year old male who presented to the ED tonight with Fall; Hyperglycemia; and Loss of Consciousness      On reeval states an episode of syncope associated with fall along with right arm numbness/weakness that has since resolved.  Labs overall unremarkable but concern for syncope vs TIA or both, plan for admit to workup/manage both.   Labs, studies and imaging reviewed by myself and considered in medical decision making if ordered. Imaging interpreted by radiology.  Labs Reviewed  CBC - Abnormal; Notable for the following:       Result Value   HCT 38.0 (*)    All other components within normal limits  URINALYSIS, ROUTINE W REFLEX MICROSCOPIC - Abnormal; Notable for the following:    Color, Urine STRAW (*)    Glucose, UA >=500 (*)    Hgb urine dipstick MODERATE (*)    Ketones, ur 5 (*)    Bacteria, UA RARE (*)    All other components within normal limits  COMPREHENSIVE METABOLIC PANEL - Abnormal; Notable for the following:    Sodium 130 (*)    Chloride 95 (*)    CO2 20 (*)    Glucose, Bld 520 (*)    BUN 35 (*)    Creatinine, Ser 2.60 (*)    Total Protein 6.2 (*)    GFR calc non Af Amer 24 (*)    GFR calc Af Amer 28 (*)    All other components within normal limits  CBG MONITORING, ED - Abnormal; Notable for the following:    Glucose-Capillary >600 (*)    All other components within normal limits  ETHANOL  DIFFERENTIAL  RAPID URINE DRUG SCREEN, HOSP PERFORMED  CK  TROPONIN I    CT Head Wo Contrast  Final Result      No Follow-up on file.    Merrily Pew, MD 10/17/16 [id number]

## 2016-10-15 ENCOUNTER — Observation Stay (HOSPITAL_BASED_OUTPATIENT_CLINIC_OR_DEPARTMENT_OTHER): Payer: Medicare HMO

## 2016-10-15 DIAGNOSIS — E1165 Type 2 diabetes mellitus with hyperglycemia: Secondary | ICD-10-CM

## 2016-10-15 DIAGNOSIS — I10 Essential (primary) hypertension: Secondary | ICD-10-CM

## 2016-10-15 DIAGNOSIS — Z794 Long term (current) use of insulin: Secondary | ICD-10-CM

## 2016-10-15 DIAGNOSIS — I340 Nonrheumatic mitral (valve) insufficiency: Secondary | ICD-10-CM

## 2016-10-15 DIAGNOSIS — R9431 Abnormal electrocardiogram [ECG] [EKG]: Secondary | ICD-10-CM | POA: Diagnosis not present

## 2016-10-15 DIAGNOSIS — R55 Syncope and collapse: Secondary | ICD-10-CM | POA: Diagnosis not present

## 2016-10-15 DIAGNOSIS — E860 Dehydration: Secondary | ICD-10-CM | POA: Diagnosis not present

## 2016-10-15 DIAGNOSIS — E871 Hypo-osmolality and hyponatremia: Secondary | ICD-10-CM

## 2016-10-15 LAB — ECHOCARDIOGRAM COMPLETE
Height: 73 in
WEIGHTICAEL: 2920.65 [oz_av]

## 2016-10-15 LAB — GLUCOSE, CAPILLARY
GLUCOSE-CAPILLARY: 296 mg/dL — AB (ref 65–99)
Glucose-Capillary: 225 mg/dL — ABNORMAL HIGH (ref 65–99)
Glucose-Capillary: 93 mg/dL (ref 65–99)
Glucose-Capillary: 232 mg/dL — ABNORMAL HIGH (ref 65–99)
Glucose-Capillary: 174 mg/dL — ABNORMAL HIGH (ref 65–99)
Glucose-Capillary: 103 mg/dL — ABNORMAL HIGH (ref 65–99)

## 2016-10-15 LAB — BASIC METABOLIC PANEL
ANION GAP: 11 (ref 5–15)
BUN: 23 mg/dL — ABNORMAL HIGH (ref 6–20)
CHLORIDE: 105 mmol/L (ref 101–111)
CO2: 22 mmol/L (ref 22–32)
Calcium: 8.3 mg/dL — ABNORMAL LOW (ref 8.9–10.3)
Creatinine, Ser: 1.66 mg/dL — ABNORMAL HIGH (ref 0.61–1.24)
GFR calc non Af Amer: 42 mL/min — ABNORMAL LOW (ref >60)
GFR, EST AFRICAN AMERICAN: 48 mL/min — AB (ref >60)
Glucose, Bld: 186 mg/dL — ABNORMAL HIGH (ref 65–99)
POTASSIUM: 3.6 mmol/L (ref 3.5–5.1)
SODIUM: 138 mmol/L (ref 135–145)

## 2016-10-15 LAB — CBC
HCT: 33.4 % — ABNORMAL LOW (ref 39.0–52.0)
Hemoglobin: 11.2 g/dL — ABNORMAL LOW (ref 13.0–17.0)
MCH: 29.3 pg (ref 26.0–34.0)
MCHC: 33.5 g/dL (ref 30.0–36.0)
MCV: 87.4 fL (ref 78.0–100.0)
PLATELETS: 155 10*3/uL (ref 150–400)
RBC: 3.82 MIL/uL — AB (ref 4.22–5.81)
RDW: 12.8 % (ref 11.5–15.5)
WBC: 6.5 10*3/uL (ref 4.0–10.5)

## 2016-10-15 MED ORDER — INSULIN ASPART PROT & ASPART (70-30 MIX) 100 UNIT/ML ~~LOC~~ SUSP
20.0000 [IU] | Freq: Two times a day (BID) | SUBCUTANEOUS | Status: DC
  Administered 2016-10-15 – 2016-10-16 (×2): 20 [IU] via SUBCUTANEOUS
  Filled 2016-10-15: qty 10

## 2016-10-15 MED ORDER — INSULIN ASPART 100 UNIT/ML ~~LOC~~ SOLN
0.0000 [IU] | Freq: Three times a day (TID) | SUBCUTANEOUS | Status: DC
  Administered 2016-10-15 – 2016-10-16 (×2): 2 [IU] via SUBCUTANEOUS

## 2016-10-15 MED ORDER — ACETAMINOPHEN 325 MG PO TABS
650.0000 mg | ORAL_TABLET | Freq: Four times a day (QID) | ORAL | Status: DC | PRN
  Administered 2016-10-16: 650 mg via ORAL
  Filled 2016-10-15: qty 2

## 2016-10-15 MED ORDER — POTASSIUM CHLORIDE IN NACL 20-0.9 MEQ/L-% IV SOLN
INTRAVENOUS | Status: AC
  Administered 2016-10-16: 05:00:00 via INTRAVENOUS
  Filled 2016-10-15 (×3): qty 1000

## 2016-10-15 MED ORDER — ENOXAPARIN SODIUM 40 MG/0.4ML ~~LOC~~ SOLN
40.0000 mg | SUBCUTANEOUS | Status: DC
  Administered 2016-10-15: 40 mg via SUBCUTANEOUS
  Filled 2016-10-15: qty 0.4

## 2016-10-15 MED ORDER — ACETAMINOPHEN 650 MG RE SUPP: 650.0000 mg | Freq: Four times a day (QID) | RECTAL | Status: DC | PRN
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
## 2016-10-15 NOTE — Progress Notes (Signed)
Pt ambulated independently around the floor approx 425 feet. Pt did not have any c/o dizziness, pain, or lightheadedness.

## 2016-10-16 ENCOUNTER — Telehealth: Payer: Self-pay | Admitting: Primary Care

## 2016-10-16 DIAGNOSIS — R9431 Abnormal electrocardiogram [ECG] [EKG]: Secondary | ICD-10-CM | POA: Diagnosis not present

## 2016-10-16 DIAGNOSIS — I10 Essential (primary) hypertension: Secondary | ICD-10-CM | POA: Diagnosis not present

## 2016-10-16 DIAGNOSIS — I129 Hypertensive chronic kidney disease with stage 1 through stage 4 chronic kidney disease, or unspecified chronic kidney disease: Secondary | ICD-10-CM | POA: Diagnosis not present

## 2016-10-16 DIAGNOSIS — E1165 Type 2 diabetes mellitus with hyperglycemia: Secondary | ICD-10-CM | POA: Diagnosis not present

## 2016-10-16 DIAGNOSIS — E1122 Type 2 diabetes mellitus with diabetic chronic kidney disease: Secondary | ICD-10-CM | POA: Diagnosis not present

## 2016-10-16 DIAGNOSIS — R55 Syncope and collapse: Secondary | ICD-10-CM | POA: Diagnosis not present

## 2016-10-16 DIAGNOSIS — E860 Dehydration: Secondary | ICD-10-CM | POA: Diagnosis not present

## 2016-10-16 LAB — GLUCOSE, CAPILLARY
GLUCOSE-CAPILLARY: 72 mg/dL (ref 65–99)
Glucose-Capillary: 181 mg/dL — ABNORMAL HIGH (ref 65–99)

## 2016-10-16 LAB — BASIC METABOLIC PANEL
ANION GAP: 8 (ref 5–15)
BUN: 16 mg/dL (ref 6–20)
CALCIUM: 8.7 mg/dL — AB (ref 8.9–10.3)
CO2: 22 mmol/L (ref 22–32)
Chloride: 111 mmol/L (ref 101–111)
Creatinine, Ser: 1.7 mg/dL — ABNORMAL HIGH (ref 0.61–1.24)
GFR calc Af Amer: 47 mL/min — ABNORMAL LOW (ref >60)
GFR, EST NON AFRICAN AMERICAN: 41 mL/min — AB (ref >60)
GLUCOSE: 73 mg/dL (ref 65–99)
Potassium: 3.5 mmol/L (ref 3.5–5.1)
SODIUM: 141 mmol/L (ref 135–145)

## 2016-10-16 LAB — CBC
HCT: 32.8 % — ABNORMAL LOW (ref 39.0–52.0)
Hemoglobin: 10.9 g/dL — ABNORMAL LOW (ref 13.0–17.0)
MCH: 29.6 pg (ref 26.0–34.0)
MCHC: 33.2 g/dL (ref 30.0–36.0)
MCV: 89.1 fL (ref 78.0–100.0)
Platelets: 155 10*3/uL (ref 150–400)
RBC: 3.68 MIL/uL — ABNORMAL LOW (ref 4.22–5.81)
RDW: 13.2 % (ref 11.5–15.5)
WBC: 5.5 10*3/uL (ref 4.0–10.5)

## 2016-10-16 MED ORDER — INSULIN ASPART PROT & ASPART (70-30 MIX) 100 UNIT/ML ~~LOC~~ SUSP: 20.0000 [IU] | Freq: Two times a day (BID) | SUBCUTANEOUS | 0 refills | Status: DC
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
## 2016-10-16 NOTE — Telephone Encounter (Signed)
Patient is scheduled to see Katha Cabal this week for MWV, that's fine, but I'd like to see him before 05/14 for hospital follow up. Please move his appointment ot a sooner date if possible.

## 2016-10-17 LAB — HEMOGLOBIN A1C
Hgb A1c MFr Bld: >15.5 % — ABNORMAL HIGH (ref 4.8–5.6)
Mean Plasma Glucose: >398 mg/dL

## 2016-10-17 NOTE — Telephone Encounter (Signed)
Spoken and notified patient of Kate's comments. Patient verbalized understanding. Schedule patient an appointment on 10/19/2016

## 2016-10-17 NOTE — Telephone Encounter (Signed)
Message left for patient to return my call.  

## 2016-10-18 ENCOUNTER — Telehealth: Payer: Self-pay

## 2016-10-18 NOTE — Telephone Encounter (Signed)
Unable to reach patient.  Left message on patient's answering machine requesting a return call to [place] or [place] at [employer] [phone].  No further details given as I do not see a DPR.

## 2016-10-19 ENCOUNTER — Ambulatory Visit (INDEPENDENT_AMBULATORY_CARE_PROVIDER_SITE_OTHER): Payer: Medicare HMO

## 2016-10-19 ENCOUNTER — Ambulatory Visit (INDEPENDENT_AMBULATORY_CARE_PROVIDER_SITE_OTHER): Payer: Medicare HMO | Admitting: Primary Care

## 2016-10-19 ENCOUNTER — Encounter: Payer: Self-pay | Admitting: Primary Care

## 2016-10-19 VITALS — BP 108/78 | HR 66 | Temp 97.7°F | Ht 72.25 in | Wt 210.5 lb

## 2016-10-19 DIAGNOSIS — Z794 Long term (current) use of insulin: Secondary | ICD-10-CM | POA: Diagnosis not present

## 2016-10-19 DIAGNOSIS — E1165 Type 2 diabetes mellitus with hyperglycemia: Secondary | ICD-10-CM | POA: Diagnosis not present

## 2016-10-19 DIAGNOSIS — Z0000 Encounter for general adult medical examination without abnormal findings: Secondary | ICD-10-CM

## 2016-10-19 DIAGNOSIS — R9431 Abnormal electrocardiogram [ECG] [EKG]: Secondary | ICD-10-CM

## 2016-10-19 DIAGNOSIS — I10 Essential (primary) hypertension: Secondary | ICD-10-CM

## 2016-10-19 DIAGNOSIS — N183 Chronic kidney disease, stage 3 unspecified: Secondary | ICD-10-CM

## 2016-10-19 DIAGNOSIS — D649 Anemia, unspecified: Secondary | ICD-10-CM

## 2016-10-19 DIAGNOSIS — R55 Syncope and collapse: Secondary | ICD-10-CM

## 2016-10-19 LAB — BASIC METABOLIC PANEL
BUN: 12 mg/dL (ref 6–23)
CALCIUM: 9.8 mg/dL (ref 8.4–10.5)
CHLORIDE: 108 meq/L (ref 96–112)
CO2: 27 meq/L (ref 19–32)
CREATININE: 1.68 mg/dL — AB (ref 0.40–1.50)
GFR: 52.92 mL/min — ABNORMAL LOW (ref >60.00)
Glucose, Bld: 53 mg/dL — ABNORMAL LOW (ref 70–99)
Potassium: 3.9 mEq/L (ref 3.5–5.1)
SODIUM: 141 meq/L (ref 135–145)

## 2016-10-19 LAB — CBC
HCT: 39.2 % (ref 39.0–52.0)
Hemoglobin: 12.8 g/dL — ABNORMAL LOW (ref 13.0–17.0)
MCHC: 32.7 g/dL (ref 30.0–36.0)
MCV: 91.9 fl (ref 78.0–100.0)
PLATELETS: 225 10*3/uL (ref 150.0–400.0)
RBC: 4.26 Mil/uL (ref 4.22–5.81)
RDW: 14.3 % (ref 11.5–15.5)
WBC: 7.7 10*3/uL (ref 4.0–10.5)

## 2016-10-19 NOTE — Patient Instructions (Signed)
[name] , Thank you for taking time to come for your Medicare Wellness Visit. I appreciate your ongoing commitment to your health goals. Please review the following plan we discussed and let me know if I can assist you in the future.   These are the goals we discussed: Goals    . Increase physical activity          Starting 10/19/2016, I will continue to exercise for at least 30 min 5 days per week.        This is a list of the screening recommended for you and due dates:  Health Maintenance  Topic Date Due  .  Hepatitis C: One time screening is recommended by Center for Disease Control  (CDC) for  adults born from [birth year] through 1965.   10/19/2025*  . HIV Screening  10/19/2025*  . Tetanus Vaccine  10/20/2026*  . Complete foot exam   10/26/2016  . Eye exam for diabetics  11/03/2016  . Flu Shot  01/11/2017  . Hemoglobin A1C  04/16/2017  . Urine Protein Check  07/21/2017  . Pneumonia vaccines (2 of 2 - PPSV23) 03/14/2019  . Colon Cancer Screening  06/13/2020  *Topic was postponed. The date shown is not the original due date.   Preventive Care for Adults  A healthy lifestyle and preventive care can promote health and wellness. Preventive health guidelines for adults include the following key practices.  . A routine yearly physical is a good way to check with your health care provider about your health and preventive screening. It is a chance to share any concerns and updates on your health and to receive a thorough exam.  . Visit your dentist for a routine exam and preventive care every 6 months. Brush your teeth twice a day and floss once a day. Good oral hygiene prevents tooth decay and gum disease.  . The frequency of eye exams is based on your age, health, family medical history, use  of contact lenses, and other factors. Follow your health care provider's ecommendations for frequency of eye exams.  . Eat a healthy diet. Foods like vegetables, fruits, whole grains, low-fat  dairy products, and lean protein foods contain the nutrients you need without too many calories. Decrease your intake of foods high in solid fats, added sugars, and salt. Eat the right amount of calories for you. Get information about a proper diet from your health care provider, if necessary.  . Regular physical exercise is one of the most important things you can do for your health. Most adults should get at least 150 minutes of moderate-intensity exercise (any activity that increases your heart rate and causes you to sweat) each week. In addition, most adults need muscle-strengthening exercises on 2 or more days a week.  Silver Sneakers may be a benefit available to you. To determine eligibility, you may visit the website: www.silversneakers.com or contact program at 701-380-1490 Mon-Fri between 8AM-8PM.   . Maintain a healthy weight. The body mass index (BMI) is a screening tool to identify possible weight problems. It provides an estimate of body fat based on height and weight. Your health care provider can find your BMI and can help you achieve or maintain a healthy weight.   For adults 20 years and older: ? A BMI below 18.5 is considered underweight. ? A BMI of 18.5 to 24.9 is normal. ? A BMI of 25 to 29.9 is considered overweight. ? A BMI of 30 and above is considered obese.   Marland Kitchen  Maintain normal blood lipids and cholesterol levels by exercising and minimizing your intake of saturated fat. Eat a balanced diet with plenty of fruit and vegetables. Blood tests for lipids and cholesterol should begin at age 76 and be repeated every 5 years. If your lipid or cholesterol levels are high, you are over 50, or you are at high risk for heart disease, you may need your cholesterol levels checked more frequently. Ongoing high lipid and cholesterol levels should be treated with medicines if diet and exercise are not working.  . If you smoke, find out from your health care provider how to quit. If you do  not use tobacco, please do not start.  . If you choose to drink alcohol, please do not consume more than 2 drinks per day. One drink is considered to be 12 ounces (355 mL) of beer, 5 ounces (148 mL) of wine, or 1.5 ounces (44 mL) of liquor.  . If you are 73-46 years old, ask your health care provider if you should take aspirin to prevent strokes.  . Use sunscreen. Apply sunscreen liberally and repeatedly throughout the day. You should seek shade when your shadow is shorter than you. Protect yourself by wearing long sleeves, pants, a wide-brimmed hat, and sunglasses year round, whenever you are outdoors.  . Once a month, do a whole body skin exam, using a mirror to look at the skin on your back. Tell your health care provider of new moles, moles that have irregular borders, moles that are larger than a pencil eraser, or moles that have changed in shape or color.

## 2016-10-19 NOTE — Progress Notes (Signed)
Subjective:    Patient ID: Jonathan Page, male    DOB: 01/07/1951, 66 y.o.   MRN: [id number]  HPI  Jonathan Page is a 66 year old male who presents today for hospital follow up.  He [facility]16 with a complaint of syncope with fall and hyperglycemia that occurred the day prior. He was mowing the lawn for several hours, got up from his mower, and had a syncopal episode. He did hit his head and awoke with dizziness and headache. He experienced a second episode of syncope later that night when walking to the bathroom.  During his stay in the ED his blood sugar was noted to be greater than 600 with A1C later revealing 15.5. He underwent CT of his head given his fall with parotid gland irregularity, otherwise unremarkable. ECG revealed increased QTc. He was admitted for treatment of hyperglycemia and for further evaluation of syncope.   During his hospital stay his glucose improved with IV fluids and insulin. His lisinopril and HCTZ was held because of the prolonged QTc interval and decreased kidney function. Syncope was suggested to be related to hyperglycemia with dehydration, but he was advised not to drive for 6 months until cleared. He was switched from Levemir to Novolin 70/30 20 units BID as his Levemir was becoming too costly. He was noted to have acute on chronic CKD with improvement of creatinine from 2.4 to 1.6 on discharge. 2D echo without acute findings.   He was discharged home on 10/16/16 with recommendations for nephrology evaluation, alternate BP medication given renal dysfunction and QT prolongation. QTC did improve upon discharge to 451 mc.   Since his discharge home he's compliant to his Novolin 70/30, 20 units twice daily. He's checking his sugars 10 times daily. His fasting sugars are running 60's-80's, 1 hour after lunch 115 range, bedtime 115. His lowest number was 48 which was this morning. He had no dizziness, jitteriness, weakness with this low number. He's  also recently improved his diet significantly, he stopped smoking, and he's started working out.   His blood pressure in the clinic today is 108/78. Overall he's feeling much better. He denies weakness, shaking/trembling, dizziness, chest pain. He does have a mild headache from the site of the fall, overall much improved.  Review of Systems  Constitutional: Negative for fatigue.  Respiratory: Negative for shortness of breath.   Cardiovascular: Negative for chest pain.  Endocrine: Negative for polyuria.  Neurological: Negative for dizziness, syncope, weakness and numbness.       Mild headache, overall improved       Past Medical History:  Diagnosis Date  . Concussion syndrome   . Diabetes ([place])   . Frequent headaches   . Hypertension      Social History   Social History  . Marital status: Divorced    Spouse name: N/A  . Number of children: N/A  . Years of education: N/A   Occupational History  . Not on file.   Social History Main Topics  . Smoking status: Former Smoker    Types: Cigarettes    Quit date: 09/19/2016  . Smokeless tobacco: Never Used  . Alcohol use 0.0 oz/week     Comment: occasional beer  . Drug use: No  . Sexual activity: Not on file   Other Topics Concern  . Not on file   Social History Narrative   Divorced.   2 children, 1 grandchild.   Retired. Once worked as a Catering manager.  Enjoys relaxing.     Past Surgical History:  Procedure Laterality Date  . APPENDECTOMY  1960    Family History  Problem Relation Age of Onset  . Diabetes Father   . Diabetes Paternal Grandfather   . Diabetes Paternal Grandmother     No Known Allergies  Current Outpatient Prescriptions on File Prior to Visit  Medication Sig Dispense Refill  . Alcohol Swabs (B-D SINGLE USE SWABS REGULAR) PADS Use to test blood sugar 3 times daily 300 each 3  . aspirin EC 81 MG tablet Take 81 mg by mouth daily.    Marland Kitchen atorvastatin (LIPITOR) 40 MG tablet Take 1 tablet (40 mg  total) by mouth daily. 90 tablet 0  . Blood Glucose Calibration (TRUE METRIX LEVEL 1) Low SOLN Use to test blood sugar 3 times daily 3 each 3  . Blood Glucose Monitoring Suppl (TRUE METRIX METER) DEVI 1 Device by Does not apply route 3 (three) times daily. Use to test blood sugar 3 times daily 1 Device 0  . glucose blood (TRUE METRIX BLOOD GLUCOSE TEST) test strip Use to test blood sugar 3 times daily 300 each 3  . insulin aspart protamine- aspart (NOVOLOG MIX 70/30) (70-30) 100 UNIT/ML injection Inject 0.2 mLs (20 Units total) into the skin 2 (two) times daily with a meal. May use Relion brand from [employer]. 10 mL 0  . TRUEPLUS LANCETS 33G MISC Use to test blood sugar 3 times daily 300 each 3   No current facility-administered medications on file prior to visit.     BP 108/78 (BP Location: Right Arm, Patient Position: Sitting, Cuff Size: Normal)   Pulse 66   Temp 97.7 F (36.5 C) (Oral)   Ht 6' 0.25" (1.835 m) Comment: no shoes  Wt 210 lb 8 oz (95.5 kg)   SpO2 97%   BMI 28.35 kg/m    Objective:   Physical Exam  Constitutional: He is oriented to person, place, and time. He appears well-nourished.  Eyes: EOM are normal. Pupils are equal, round, and reactive to light.  Neck: Neck supple.  Cardiovascular: Normal rate and regular rhythm.   Pulmonary/Chest: Effort normal and breath sounds normal.  Neurological: He is alert and oriented to person, place, and time. No cranial nerve deficit.  Skin: Skin is warm and dry.          Assessment & Plan:

## 2016-10-19 NOTE — Patient Instructions (Addendum)
You will be contacted regarding your referral to the kidney doctor.  Please let us know if you have not heard back within one week.   Complete lab work prior to leaving today. I will notify you of your results once received.   Reduce your insulin to 16 units twice daily. Continue to monitor your sugars at least three times daily and notify me if you get readings above 150 or below 100 consistently.  Continue to work on improvements in your diet. Continue regular exercise.   Ensure you are consuming 64 ounces of water daily. More if exercising.   Please follow up in 1 month with your blood sugar logs.   It was a pleasure to see you today!

## 2016-10-19 NOTE — Progress Notes (Signed)
I reviewed health advisor's note, was available for consultation, and agree with documentation and plan.  

## 2016-10-19 NOTE — Assessment & Plan Note (Signed)
Low normal today, continue to hold lisinopril and HCTZ. Nephrology referral placed for evaluation of CKD.

## 2016-10-19 NOTE — Assessment & Plan Note (Addendum)
Workup in the emergency department and hospitalization without evidence of seizure cause. All other testing overall unremarkable. Suspect symptoms were secondary to profound dehydration coupled with hyperglycemia. He is asymptomatic and has been so since discharge on May 6.  Exam today unremarkable. Repeat ECG with improvement and QTC, right bundle branch block, normal sinus rhythm with a rate of 60. Do not see a reason to withhold him from driving at this time given negative workup and hospitalization and lack of symptoms since correction of hyperglycemia. Do not see need for MRI of brain at this time, will continue to monitor.  All Hospital notes, imaging, labs reviewed.

## 2016-10-19 NOTE — Assessment & Plan Note (Signed)
CKD stage III. Repeat BMP today. Repeat CBC today. Anemia likely secondary to decreased renal function. Referral to nephrology placed for further evaluation.

## 2016-10-19 NOTE — Assessment & Plan Note (Signed)
Improved per EKG today. Likely secondary to hypoglycemia with profound dehydration. He denies chest pain.

## 2016-10-19 NOTE — Progress Notes (Signed)
Pre visit review using our clinic review tool, if applicable. No additional management support is needed unless otherwise documented below in the visit note. 

## 2016-10-19 NOTE — Assessment & Plan Note (Signed)
Compliance and Novolin 70/30. Given readings between 40s and 60s, will decrease dose to 16 units twice a day. Discussed to check sugars 3 times daily. Hold off on Ace until nephrology evaluation. Continue statin. Follow-up in one month with sugar logs.

## 2016-10-19 NOTE — Progress Notes (Signed)
PCP notes:   Health maintenance:  HIV screening - pt declined Hep C screening - pt declined Foot exam - PCP please assess at CPE Tetanus vaccine - pt declined Colonoscopy - per pt, last exam in 2012 (could not recall exact date)  Abnormal screenings:   Fall risk- hx of fall with injury and loss of consciousness; cause hyperglycemia (blood glucose in 600s per pt)  Patient concerns:   Pt is here today for a post hospital f/u appt.  Pt reported AM blood glucose reading was 48. Pt stated he drank juice and repeated test. Last reading was 124.   Nurse concerns:  None  Next PCP appt:   10/19/16 @ 1100

## 2016-10-19 NOTE — Progress Notes (Signed)
Subjective:   Jonathan Page is a 66 y.o. male who presents for an Initial Medicare Annual Wellness Visit.  Review of Systems  N/A Cardiac Risk Factors include: advanced age (>45men, >65 women);male gender;diabetes mellitus;dyslipidemia    Objective:    Today's Vitals   10/19/16 0950 10/19/16 0957  BP: 108/78   Pulse: 66   Temp: 97.7 F (36.5 C)   TempSrc: Oral   SpO2: 97%   Weight: 210 lb 8 oz (95.5 kg)   Height: 6' 0.25" (1.835 m)   PainSc: 2  2   PainLoc: Head    Body mass index is 28.35 kg/m.  Current Medications (verified) Outpatient Encounter Prescriptions as of 10/19/2016  Medication Sig  . Alcohol Swabs (B-D SINGLE USE SWABS REGULAR) PADS Use to test blood sugar 3 times daily  . aspirin EC 81 MG tablet Take 81 mg by mouth daily.  Marland Kitchen atorvastatin (LIPITOR) 40 MG tablet Take 1 tablet (40 mg total) by mouth daily.  . Blood Glucose Calibration (TRUE METRIX LEVEL 1) Low SOLN Use to test blood sugar 3 times daily  . Blood Glucose Monitoring Suppl (TRUE METRIX METER) DEVI 1 Device by Does not apply route 3 (three) times daily. Use to test blood sugar 3 times daily  . glucose blood (TRUE METRIX BLOOD GLUCOSE TEST) test strip Use to test blood sugar 3 times daily  . insulin aspart protamine- aspart (NOVOLOG MIX 70/30) (70-30) 100 UNIT/ML injection Inject 0.2 mLs (20 Units total) into the skin 2 (two) times daily with a meal. May use Relion brand from [employer].  . TRUEPLUS LANCETS 33G MISC Use to test blood sugar 3 times daily   No facility-administered encounter medications on file as of 10/19/2016.     Allergies (verified) Patient has no known allergies.   History: Past Medical History:  Diagnosis Date  . Concussion syndrome   . Diabetes ([place])   . Frequent headaches   . Hypertension    Past Surgical History:  Procedure Laterality Date  . APPENDECTOMY  1960   Family History  Problem Relation Age of Onset  . Diabetes Father   . Diabetes Paternal  Grandfather   . Diabetes Paternal Grandmother    Social History   Occupational History  . Not on file.   Social History Main Topics  . Smoking status: Former Smoker    Types: Cigarettes    Quit date: 09/19/2016  . Smokeless tobacco: Never Used  . Alcohol use 0.0 oz/week     Comment: occasional beer  . Drug use: No  . Sexual activity: Not on file   Tobacco Counseling Counseling given: No   Activities of Daily Living In your present state of health, do you have any difficulty performing the following activities: 10/19/2016 10/14/2016  Hearing? N N  Vision? N N  Difficulty concentrating or making decisions? N N  Walking or climbing stairs? N N  Dressing or bathing? N N  Doing errands, shopping? N N  Preparing Food and eating ? N -  Using the Toilet? N -  In the past six months, have you accidently leaked urine? N -  Do you have problems with loss of bowel control? N -  Managing your Medications? N -  Managing your Finances? N -  Housekeeping or managing your Housekeeping? N -  Some recent data might be hidden    Immunizations and Health Maintenance Immunization History  Administered Date(s) Administered  . Pneumococcal Conjugate-13 06/20/2016  . Pneumococcal Polysaccharide-23 03/13/2014  There are no preventive care reminders to display for this patient.  Patient Care Team: [name], NP as PCP - General (Internal Medicine)    Assessment:   This is a routine wellness examination for Jonathan Page.   Hearing/Vision screen  Hearing Screening   125Hz  250Hz  500Hz  1000Hz  2000Hz  3000Hz  4000Hz  6000Hz  8000Hz   Right ear:   40 40 40  40    Left ear:   40 40 40  40    Vision Screening Comments: Last vision exam in May 2017  Dietary issues and exercise activities discussed: Current Exercise Habits: Home exercise routine, Type of exercise: treadmill, Time (Minutes): 30, Frequency (Times/Week): 5, Weekly Exercise (Minutes/Week): 150, Intensity: Moderate, Exercise limited by:  None identified  Goals    . Increase physical activity          Starting 10/19/2016, I will continue to exercise for at least 30 min 5 days per week.       Depression Screen PHQ 2/9 Scores 10/19/2016  PHQ - 2 Score 0    Fall Risk Fall Risk  10/19/2016  Falls in the past year? Yes  Number falls in past yr: 1  Injury with Fall? Yes  Risk for fall due to : Other (Comment)  Risk for fall due to (comments): diabetic    Cognitive Function: MMSE - Mini Mental State Exam 10/19/2016  Orientation to time 5  Orientation to Place 5  Registration 3  Attention/ Calculation 0  Recall 3  Language- name 2 objects 0  Language- repeat 1  Language- follow 3 step command 3  Language- read & follow direction 0  Write a sentence 0  Copy design 0  Total score 20     PLEASE NOTE: A Mini-Cog screen was completed. Maximum score is 20. A value of 0 denotes this part of Folstein MMSE was not completed or the patient failed this part of the Mini-Cog screening.   Mini-Cog Screening Orientation to Time - Max 5 pts Orientation to Place - Max 5 pts Registration - Max 3 pts Recall - Max 3 pts Language Repeat - Max 1 pts Language Follow 3 Step Command - Max 3 pts     Screening Tests Health Maintenance  Topic Date Due  . Hepatitis C Screening  10/19/2025 (Originally Feb 14, 1951)  . HIV Screening  10/19/2025 (Originally 05/07/1966)  . TETANUS/TDAP  10/20/2026 (Originally [birth year]/1970)  . FOOT EXAM  10/26/2016  . OPHTHALMOLOGY EXAM  11/03/2016  . INFLUENZA VACCINE  01/11/2017  . HEMOGLOBIN A1C  04/16/2017  . URINE MICROALBUMIN  07/21/2017  . PNA vac Low Risk Adult (2 of 2 - PPSV23) 03/14/2019  . COLONOSCOPY  06/13/2020        Plan:     I have personally reviewed and addressed the Medicare Annual Wellness questionnaire and have noted the following in the patient's chart:  A. Medical and social history B. Use of alcohol, tobacco or illicit drugs  C. Current medications and  supplements D. Functional ability and status E.  Nutritional status F.  Physical activity G. Advance directives H. List of other physicians I.  Hospitalizations, surgeries, and ER visits in previous 12 months J.  [place] to include hearing, vision, cognitive, depression L. Referrals and appointments - none  In addition, I have reviewed and discussed with patient certain preventive protocols, quality metrics, and best practice recommendations. A written personalized care plan for preventive services as well as general preventive health recommendations were provided to patient.  See attached  scanned questionnaire for additional information.   Signed,   Lindell Noe, MHA, BS, LPN Health Coach

## 2016-10-21 NOTE — Telephone Encounter (Signed)
Patient returned Mandy's call.  Please call patient back at [phone].

## 2016-10-24 ENCOUNTER — Encounter: Payer: Medicare HMO | Admitting: Primary Care

## 2016-10-26 NOTE — Telephone Encounter (Signed)
Lm on pts vm requesting a call back 

## 2016-11-21 LAB — HM DIABETES EYE EXAM

## 2016-11-22 ENCOUNTER — Encounter: Payer: Self-pay | Admitting: Primary Care

## 2017-06-14 ENCOUNTER — Telehealth: Payer: Self-pay | Admitting: Primary Care

## 2017-06-14 NOTE — Telephone Encounter (Signed)
Copied from [place] [id number]. Topic: Quick Communication - See Telephone Encounter >> Jun 14, 2017 11:59 AM Corie Chiquito, NT wrote: CRM for notification. See Telephone encounter for: Patient called because he needs a refill on his Relion Insulin. If someone could give him a call back at [phone]  06/14/17.

## 2017-06-14 NOTE — Telephone Encounter (Signed)
I see that he has an appointment for this Friday, definitely needs repeat A1C and diabetes follow up, please have him come in tomorrow for fasting labs so we can have this information before his appointment Friday.   Is he out of his insulin? Also please clarify as Relion insulin can be purchased behind the pharmacy counter without a prescription. Is he referring to Relion or Novolog Mix?

## 2017-06-14 NOTE — Telephone Encounter (Signed)
Pt wanting refill. Last refill 10/16/16  OV 10/19/16. Ordering physician was Dr. Claris Gower. Routing back to office. Pt has OV 06/16/17

## 2017-06-14 NOTE — Telephone Encounter (Signed)
Noted, will discuss at upcoming visit.

## 2017-06-14 NOTE — Telephone Encounter (Signed)
Spoken and notified patient of Kate's comments. Lab appt has been scheduled. Patient stated that he will go to [facility] and purchase the Relion. I have informed him of the instructions on his current medication list.

## 2017-06-15 ENCOUNTER — Other Ambulatory Visit (INDEPENDENT_AMBULATORY_CARE_PROVIDER_SITE_OTHER): Payer: Medicare HMO

## 2017-06-15 DIAGNOSIS — E1165 Type 2 diabetes mellitus with hyperglycemia: Secondary | ICD-10-CM

## 2017-06-15 DIAGNOSIS — Z794 Long term (current) use of insulin: Principal | ICD-10-CM

## 2017-06-15 LAB — HEMOGLOBIN A1C: Hgb A1c MFr Bld: 13.1 % — ABNORMAL HIGH (ref 4.6–6.5)

## 2017-06-16 ENCOUNTER — Encounter: Payer: Self-pay | Admitting: Primary Care

## 2017-06-16 ENCOUNTER — Ambulatory Visit (INDEPENDENT_AMBULATORY_CARE_PROVIDER_SITE_OTHER): Payer: Medicare HMO | Admitting: Primary Care

## 2017-06-16 VITALS — BP 118/74 | HR 90 | Temp 98.4°F | Ht 72.25 in | Wt 199.0 lb

## 2017-06-16 DIAGNOSIS — Z794 Long term (current) use of insulin: Secondary | ICD-10-CM | POA: Diagnosis not present

## 2017-06-16 DIAGNOSIS — E1165 Type 2 diabetes mellitus with hyperglycemia: Secondary | ICD-10-CM | POA: Diagnosis not present

## 2017-06-16 NOTE — Assessment & Plan Note (Signed)
Uncontrolled with recent A1c of 13.1, did not bring in glucose logs, and is uncertain as to which insulin he's taking.  He will call us back later today with the type of insulin he's using. Recommend he do Relion NPH 20 untis BID with Relion Regular 10 units three times daily for blood sugars greater than 120. Will notify patient once he calls back.  Follow up in 6 weeks with glucose logs. Urine microalbumin pending.

## 2017-06-16 NOTE — Patient Instructions (Addendum)
Please call me with the name of the insulin you are taking. It will be either Relion N or Relion R.  Check your blood sugars every morning before breakfast, before lunch, before dinner. Write down the number and bring them to your next visit.  Stop by the lab prior to leaving today. I will notify you of your results once received.   Schedule a follow up visit in 6 weeks, please bring your blood sugar logs.   It was a pleasure to see you today!   Type 2 Diabetes Mellitus, Self Care, Adult Caring for yourself after you have been diagnosed with type 2 diabetes (type 2 diabetes mellitus) means keeping your blood sugar (glucose) under control with a balance of:  Nutrition.  Exercise.  Lifestyle changes.  Medicines or insulin, if necessary.  Support from your team of health care providers and others.  The following information explains what you need to know to manage your diabetes at home. What do I need to do to manage my blood glucose?  Check your blood glucose every day, as often as told by your health care provider.  Contact your health care provider if your blood glucose is above your target for 2 tests in a row.  Have your A1c (hemoglobin A1c) level checked at least two times a year, or as often as told by your health care provider. Your health care provider will set individualized treatment goals for you. Generally, the goal of treatment is to maintain the following blood glucose levels:  Before meals (preprandial): 80-130 mg/dL (4.4-7.2 mmol/L).  After meals (postprandial): below 180 mg/dL (10 mmol/L).  A1c level: less than 7%.  What do I need to know about hyperglycemia and hypoglycemia? What is hyperglycemia? Hyperglycemia, also called high blood glucose, occurs when blood glucose is too high.Make sure you know the early signs of hyperglycemia, such as:  Increased thirst.  Hunger.  Feeling very tired.  Needing to urinate more often than usual.  Blurry  vision.  What is hypoglycemia? Hypoglycemia, also called low blood glucose, occurswith a blood glucose level at or below 70 mg/dL (3.9 mmol/L). The risk for hypoglycemia increases during or after exercise, during sleep, during illness, and when skipping meals or not eating for a long time (fasting). It is important to know the symptoms of hypoglycemia and treat it right away. Always have a 15-gram rapid-acting carbohydrate snack with you to treat low blood glucose. Family members and close friends should also know the symptoms and should understand how to treat hypoglycemia, in case you are not able to treat yourself. What are the symptoms of hypoglycemia? Hypoglycemia symptoms can include:  Hunger.  Anxiety.  Sweating and feeling clammy.  Confusion.  Dizziness or feeling light-headed.  Sleepiness.  Nausea.  Increased heart rate.  Headache.  Blurry vision.  Seizure.  Nightmares.  Tingling or numbness around the mouth, lips, or tongue.  A change in speech.  Decreased ability to concentrate.  A change in coordination.  Restless sleep.  Tremors or shakes.  Fainting.  Irritability.  How do I treat hypoglycemia?  If you are alert and able to swallow safely, follow the 15:15 rule:  Take 15 grams of a rapid-acting carbohydrate. Rapid-acting options include: ? 1 tube of glucose gel. ? 3 glucose pills. ? 6-8 pieces of hard candy. ? 4 oz (120 mL) of fruit juice. ? 4 oz (120 mL) of regular (not diet) soda.  Check your blood glucose 15 minutes after you take the carbohydrate.  If the repeat blood glucose level is still at or below 70 mg/dL (3.9 mmol/L), take 15 grams of a carbohydrate again.  If your blood glucose level does not increase above 70 mg/dL (3.9 mmol/L) after 3 tries, seek emergency medical care.  After your blood glucose level returns to normal, eat a meal or a snack within 1 hour.  How do I treat severe hypoglycemia? Severe hypoglycemia is when  your blood glucose level is at or below 54 mg/dL (3 mmol/L). Severe hypoglycemia is an emergency. Do not wait to see if the symptoms will go away. Get medical help right away. Call your local emergency services (911 in the [place] Do not drive yourself to the hospital. If you have severe hypoglycemia and you cannot eat or drink, you may need an injection of glucagon. A family member or close friend should learn how to check your blood glucose and how to give you a glucagon injection. Ask your health care provider if you need to have an emergency glucagon injection kit available. Severe hypoglycemia may need to be treated in a hospital. The treatment may include getting glucose through an IV tube. You may also need treatment for the cause of your hypoglycemia. Can having diabetes put me at risk for other conditions? Having diabetes can put you at risk for other long-term (chronic) conditions, such as heart disease and kidney disease. Your health care provider may prescribe medicines to help prevent complications from diabetes. These medicines may include:  Aspirin.  Medicine to lower cholesterol.  Medicine to control blood pressure.  What else can I do to manage my diabetes? Take your diabetes medicines as told  If your health care provider prescribed insulin or diabetes medicines, take them every day.  Do not run out of insulin or other diabetes medicines that you take. Plan ahead so you always have these available.  If you use insulin, adjust your dosage based on how physically active you are and what foods you eat. Your health care provider will tell you how to adjust your dosage. Make healthy food choices  The things that you eat and drink affect your blood glucose and your insulin dosage. Making good choices helps to control your diabetes and prevent other health problems. A healthy meal plan includes eating lean proteins, complex carbohydrates, fresh fruits and vegetables, low-fat dairy  products, and healthy fats. Make an appointment to see a diet and nutrition specialist (registered dietitian) to help you create an eating plan that is right for you. Make sure that you:  Follow instructions from your health care provider about eating or drinking restrictions.  Drink enough fluid to keep your urine clear or pale yellow.  Eat healthy snacks between nutritious meals.  Track the carbohydrates that you eat. Do this by reading food labels and learning the standard serving sizes of foods.  Follow your sick day plan whenever you cannot eat or drink as usual. Make this plan in advance with your health care provider.  Stay active  Exercise regularly, as told by your health care provider. This may include:  Stretching and doing strength exercises, such as yoga or weightlifting, at least 2 times a week.  Doing at least 150 minutes of moderate-intensity or vigorous-intensity exercise each week. This could be brisk walking, biking, or water aerobics. ? Spread out your activity over at least 3 days of the week. ? Do not go more than 2 days in a row without doing some kind of physical activity.  When  you start a new exercise or activity, work with your health care provider to adjust your insulin, medicines, or food intake as needed. Make healthy lifestyle choices  Do not use any tobacco products, such as cigarettes, chewing tobacco, and e-cigarettes. If you need help quitting, ask your health care provider.  If your health care provider says that alcohol is safe for you, limit alcohol intake to no more than 1 drink per day for nonpregnant women and 2 drinks per day for men. One drink equals 12 oz of beer, 5 oz of wine, or 1 oz of hard liquor.  Learn to manage stress. If you need help with this, ask your health care provider. Care for your body   Keep your immunizations up to date. In addition to getting vaccinations as told by your health care provider, it is recommended that you  get vaccinated against the following illnesses: ? The flu (influenza). Get a flu shot every year. ? Pneumonia. ? Hepatitis B.  Schedule an eye exam soon after your diagnosis, and then one time every year after that.  Check your skin and feet every day for cuts, bruises, redness, blisters, or sores. Schedule a foot exam with your health care provider once every year.  Brush your teeth and gums two times a day, and floss at least one time a day. Visit your dentist at least once every 6 months.  Maintain a healthy weight. General instructions  Take over-the-counter and prescription medicines only as told by your health care provider.  Share your diabetes management plan with people in your workplace, school, and household.  Check your urine for ketones when you are ill and as told by your health care provider.  Ask your health care provider: ? Do I need to meet with a diabetes educator? ? Where can I find a support group for people with diabetes?  Carry a medical alert card or wear medical alert jewelry.  Keep all follow-up visits as told by your health care provider. This is important. Where to find more information: For more information about diabetes, visit:  American Diabetes Association (ADA): www.diabetes.org  American Association of Diabetes Educators (AADE): www.diabeteseducator.org/patient-resources  This information is not intended to replace advice given to you by your health care provider. Make sure you discuss any questions you have with your health care provider. Document Released: 09/21/2015 Document Revised: 11/05/2015 Document Reviewed: 07/03/2015 Elsevier Interactive Patient Education  [employer].

## 2017-06-16 NOTE — Progress Notes (Signed)
Subjective:    Patient ID: Jonathan Page, male    DOB: 08/22/1950, 67 y.o.   MRN: [id number]  HPI  Jonathan Page is a 67 year old male who presents today for follow up.  1) Type 2 Diabetes:  Current medications include: Relion insulin 20 units twice daily with meals. He's not sure which insulin he has, NPH or Regular. Previously on Novolog Mix 70/30 but this was too expensive.   He is checking his blood glucose twice daily and is getting readings of: AM Fasting: 135 During dinner: 130-140  He didn't bring in his glucose logs, and can't remember his exact numbers.  Last A1C: 13.1 Last Eye Exam: Completed in June 2018 Last Foot Exam: Due today. Pneumonia Vaccination: Completed pneumovax in 2015, completed Prevnar in 2018 ACE/ARB: None Statin: Atorvastatin  Diet currently consists of:  Breakfast: Oatmeal, fruit Lunch: Some salads Dinner: Baked fish, no vegetables  Snacks: None Desserts: Occasionally  Beverages: Water, green tea  Exercise: He does not currently exercise.    2) Arm Pain: Feel several weeks ago during the icy/snowy weather. He landed on his left upper extremity. He denies decrease in ROM, weakness. Overall his pain has improved. Several days after the fall he started to notice an intermittent tremor to his left hand. He has noticed a decrease in grip when his hand shakes. He's had a mild tremor to his head for years.     Review of Systems  Constitutional: Negative for fatigue.  Eyes: Negative for visual disturbance.  Respiratory: Negative for shortness of breath.   Cardiovascular: Negative for chest pain.  Neurological: Negative for dizziness and headaches.       Past Medical History:  Diagnosis Date  . Concussion syndrome   . Diabetes ([place])   . Frequent headaches   . Hypertension      Social History   Socioeconomic History  . Marital status: Divorced    Spouse name: Not on file  . Number of children: Not on file  . Years of education: Not on  file  . Highest education level: Not on file  Social Needs  . Financial resource strain: Not on file  . Food insecurity - worry: Not on file  . Food insecurity - inability: Not on file  . Transportation needs - medical: Not on file  . Transportation needs - non-medical: Not on file  Occupational History  . Not on file  Tobacco Use  . Smoking status: Former Smoker    Types: Cigarettes    Last attempt to quit: 09/19/2016    Years since quitting: 0.7  . Smokeless tobacco: Never Used  Substance and Sexual Activity  . Alcohol use: Yes    Alcohol/week: 0.0 oz    Comment: occasional beer  . Drug use: No  . Sexual activity: Not on file  Other Topics Concern  . Not on file  Social History Narrative   Divorced.   2 children, 1 grandchild.   Retired. Once worked as a Catering manager.    Enjoys relaxing.     Past Surgical History:  Procedure Laterality Date  . APPENDECTOMY  1960    Family History  Problem Relation Age of Onset  . Diabetes Father   . Diabetes Paternal Grandfather   . Diabetes Paternal Grandmother     No Known Allergies  Current Outpatient Medications on File Prior to Visit  Medication Sig Dispense Refill  . Alcohol Swabs (B-D SINGLE USE SWABS REGULAR) PADS Use to test blood  sugar 3 times daily 300 each 3  . aspirin EC 81 MG tablet Take 81 mg by mouth daily.    Marland Kitchen atorvastatin (LIPITOR) 40 MG tablet Take 1 tablet (40 mg total) by mouth daily. 90 tablet 0  . Blood Glucose Calibration (TRUE METRIX LEVEL 1) Low SOLN Use to test blood sugar 3 times daily 3 each 3  . Blood Glucose Monitoring Suppl (TRUE METRIX METER) DEVI 1 Device by Does not apply route 3 (three) times daily. Use to test blood sugar 3 times daily 1 Device 0  . glucose blood (TRUE METRIX BLOOD GLUCOSE TEST) test strip Use to test blood sugar 3 times daily 300 each 3  . TRUEPLUS LANCETS 33G MISC Use to test blood sugar 3 times daily 300 each 3   No current facility-administered medications on file  prior to visit.     BP 118/74   Pulse 90   Temp 98.4 F (36.9 C) (Oral)   Ht 6' 0.25" (1.835 m)   Wt 199 lb (90.3 kg)   SpO2 95%   BMI 26.80 kg/m    Objective:   Physical Exam  Constitutional: He appears well-nourished.  Neck: Neck supple.  Cardiovascular: Normal rate and regular rhythm.  Pulmonary/Chest: Effort normal and breath sounds normal.  Musculoskeletal:       Right forearm: He exhibits no tenderness, no bony tenderness and no deformity.  Neurological:  No tremor noted to either hands.  Skin: Skin is warm and dry.          Assessment & Plan:  Extremity Pain:  Located to left since fall late 2018. Good ROM today, no obvious deformity. Suspect tremor unrelated, reassurance provided. No tremor noted on exam. Will have him monitor and report if pain increases.  Sheral Flow, NP

## 2017-06-17 ENCOUNTER — Emergency Department (HOSPITAL_COMMUNITY)
Admission: EM | Admit: 2017-06-17 | Discharge: 2017-06-18 | Disposition: A | Discharge status: Home / Self Care | Payer: Medicare HMO | Attending: Emergency Medicine | Admitting: Emergency Medicine

## 2017-06-17 ENCOUNTER — Encounter (HOSPITAL_COMMUNITY): Payer: Self-pay | Admitting: *Deleted

## 2017-06-17 ENCOUNTER — Other Ambulatory Visit: Payer: Self-pay

## 2017-06-17 DIAGNOSIS — Z7982 Long term (current) use of aspirin: Secondary | ICD-10-CM | POA: Diagnosis not present

## 2017-06-17 DIAGNOSIS — R739 Hyperglycemia, unspecified: Secondary | ICD-10-CM

## 2017-06-17 DIAGNOSIS — I10 Essential (primary) hypertension: Secondary | ICD-10-CM | POA: Insufficient documentation

## 2017-06-17 DIAGNOSIS — E1165 Type 2 diabetes mellitus with hyperglycemia: Secondary | ICD-10-CM | POA: Insufficient documentation

## 2017-06-17 DIAGNOSIS — Z87891 Personal history of nicotine dependence: Secondary | ICD-10-CM | POA: Insufficient documentation

## 2017-06-17 LAB — CBC
HEMATOCRIT: 47.6 % (ref 39.0–52.0)
HEMOGLOBIN: 15.3 g/dL (ref 13.0–17.0)
MCH: 30.9 pg (ref 26.0–34.0)
MCHC: 32.1 g/dL (ref 30.0–36.0)
MCV: 96.2 fL (ref 78.0–100.0)
PLATELETS: 171 10*3/uL (ref 150–400)
RBC: 4.95 MIL/uL (ref 4.22–5.81)
RDW: 13.3 % (ref 11.5–15.5)
WBC: 6.1 10*3/uL (ref 4.0–10.5)

## 2017-06-17 LAB — CBG MONITORING, ED: Glucose-Capillary: >600 mg/dL (ref 65–99)

## 2017-06-17 LAB — URINALYSIS, ROUTINE W REFLEX MICROSCOPIC
BACTERIA UA: NONE SEEN
Bilirubin Urine: NEGATIVE
Hgb urine dipstick: NEGATIVE
KETONES UR: NEGATIVE mg/dL
Leukocytes, UA: NEGATIVE
Nitrite: NEGATIVE
PH: 6 (ref 5.0–8.0)
Protein, ur: NEGATIVE mg/dL
SPECIFIC GRAVITY, URINE: 1.022 (ref 1.005–1.030)
SQUAMOUS EPITHELIAL / LPF: NONE SEEN

## 2017-06-17 LAB — BASIC METABOLIC PANEL
Anion gap: 11 (ref 5–15)
BUN: 24 mg/dL — ABNORMAL HIGH (ref 6–20)
CHLORIDE: 83 mmol/L — AB (ref 101–111)
CO2: 25 mmol/L (ref 22–32)
CREATININE: 1.74 mg/dL — AB (ref 0.61–1.24)
Calcium: 9.3 mg/dL (ref 8.9–10.3)
GFR calc non Af Amer: 39 mL/min — ABNORMAL LOW (ref >60)
GFR, EST AFRICAN AMERICAN: 45 mL/min — AB (ref >60)
Glucose, Bld: 1091 mg/dL (ref 65–99)
Potassium: 6.1 mmol/L — ABNORMAL HIGH (ref 3.5–5.1)
Sodium: 119 mmol/L — CL (ref 135–145)

## 2017-06-17 LAB — MICROALBUMIN / CREATININE URINE RATIO
CREATININE, URINE: 46 mg/dL (ref 20–320)
Microalb Creat Ratio: 146 mcg/mg creat — ABNORMAL HIGH (ref <30)
Microalb, Ur: 6.7 mg/dL

## 2017-06-17 MED ORDER — SODIUM CHLORIDE 0.9 % IV BOLUS (SEPSIS)
1000.0000 mL | Freq: Once | INTRAVENOUS | Status: AC
  Administered 2017-06-17: 1000 mL via INTRAVENOUS

## 2017-06-17 MED ORDER — DEXTROSE-NACL 5-0.45 % IV SOLN: INTRAVENOUS | Status: DC

## 2017-06-17 MED ORDER — SODIUM CHLORIDE 0.9 % IV SOLN
INTRAVENOUS | Status: DC
  Administered 2017-06-17: 5.4 [IU]/h via INTRAVENOUS
  Filled 2017-06-17: qty 1

## 2017-06-17 MED ORDER — SODIUM CHLORIDE 0.9 % IV SOLN: INTRAVENOUS | Status: DC

## 2017-06-17 NOTE — ED Provider Notes (Signed)
[facility] EMERGENCY DEPARTMENT Provider Note   CSN: [id number] Arrival date & time: 06/17/17  1733     History   Chief Complaint Chief Complaint  Patient presents with  . Hyperglycemia  . Tremors    HPI Jonathan Page is a 67 y.o. male.  Patient with a history of T2DM presents with concern for high blood sugar. He reports that the last time his CBG was elevated he had muscle spasms that caused painless, involuntary movement of his right arm. Today he started having similar symptoms on the left arm today. He reports he was off his insulin for 2 months because he felt he was treating his diabetes by eating healthy and going to the gym on a regular basis. He also reports he started back on insulin yesterday after seeing his doctor but feels he was given the wrong medication.    The history is provided by the patient. No language interpreter was used.  Hyperglycemia  Associated symptoms: no fever, no increased thirst, no nausea, no polyuria and no vomiting     Past Medical History:  Diagnosis Date  . Concussion syndrome   . Diabetes ([place])   . Frequent headaches   . Hypertension     Patient Active Problem List   Diagnosis Date Noted  . Syncope and collapse 10/14/2016  . Prolonged QT interval 10/14/2016  . Hyperlipidemia 01/25/2016  . Renal failure 01/25/2016  . Essential hypertension 10/27/2015  . Type 2 diabetes mellitus with hyperglycemia ([place]) 10/22/2015    Past Surgical History:  Procedure Laterality Date  . APPENDECTOMY  1960       Home Medications    Prior to Admission medications   Medication Sig Start Date End Date Taking? Authorizing Provider  aspirin EC 81 MG tablet Take 81 mg by mouth daily.   Yes [provider]  OVER THE COUNTER MEDICATION 20 Units. [employer] reli on 70/30   Yes [provider]  atorvastatin (LIPITOR) 40 MG tablet Take 1 tablet (40 mg total) by mouth daily. Patient not taking: Reported on 06/17/2017  09/15/16   [name], NP    Family History Family History  Problem Relation Age of Onset  . Diabetes Father   . Diabetes Paternal Grandfather   . Diabetes Paternal Grandmother     Social History Social History   Tobacco Use  . Smoking status: Former Smoker    Types: Cigarettes    Last attempt to quit: 09/19/2016    Years since quitting: 0.7  . Smokeless tobacco: Never Used  Substance Use Topics  . Alcohol use: Yes    Alcohol/week: 0.0 oz    Comment: occasional beer  . Drug use: No     Allergies   Patient has no known allergies.   Review of Systems Review of Systems  Constitutional: Negative for chills, fever and unexpected weight change.  HENT: Negative.   Respiratory: Negative.   Cardiovascular: Negative.   Gastrointestinal: Negative.  Negative for nausea and vomiting.  Endocrine: Negative for polydipsia and polyuria.  Musculoskeletal: Negative.   Skin: Negative.   Neurological: Negative.      Physical Exam Updated Vital Signs BP (!) 142/95   Pulse 81   Temp 97.9 F (36.6 C) (Oral)   Resp 16   SpO2 98%   Physical Exam  Constitutional: He is oriented to person, place, and time. He appears well-developed and well-nourished.  HENT:  Head: Normocephalic.  Mouth/Throat: Mucous membranes are not dry.  Neck: Normal  range of motion. Neck supple.  Cardiovascular: Normal rate, regular rhythm and intact distal pulses.  No murmur heard. Pulmonary/Chest: Effort normal and breath sounds normal. He has no wheezes. He has no rales.  Abdominal: Soft. Bowel sounds are normal. There is no tenderness. There is no rebound and no guarding.  Musculoskeletal: Normal range of motion.  Neurological: He is alert and oriented to person, place, and time.  No tremor noted. No abnormalities of muscle tone. Upper extremity fine motor function intact.   Skin: Skin is warm and dry. No rash noted.  Psychiatric: He has a normal mood and affect.     ED Treatments / Results    Labs (all labs ordered are listed, but only abnormal results are displayed) Labs Reviewed  BASIC METABOLIC PANEL - Abnormal; Notable for the following components:      Result Value   Sodium 119 (*)    Potassium 6.1 (*)    Chloride 83 (*)    Glucose, Bld 1,091 (*)    BUN 24 (*)    Creatinine, Ser 1.74 (*)    GFR calc non Af Amer 39 (*)    GFR calc Af Amer 45 (*)    All other components within normal limits  URINALYSIS, ROUTINE W REFLEX MICROSCOPIC - Abnormal; Notable for the following components:   Color, Urine COLORLESS (*)    Glucose, UA >=500 (*)    All other components within normal limits  CBG MONITORING, ED - Abnormal; Notable for the following components:   Glucose-Capillary >600 (*)    All other components within normal limits  CBC  CBG MONITORING, ED    EKG  EKG Interpretation None       Radiology No results found.  Procedures Procedures (including critical care time)  Medications Ordered in ED Medications  dextrose 5 %-0.45 % sodium chloride infusion (not administered)  insulin regular (NOVOLIN R,HUMULIN R) 100 Units in sodium chloride 0.9 % 100 mL (1 Units/mL) infusion (not administered)  sodium chloride 0.9 % bolus 1,000 mL (not administered)    And  sodium chloride 0.9 % bolus 1,000 mL (not administered)    And  0.9 %  sodium chloride infusion (not administered)     Initial Impression / Assessment and Plan / ED Course  I have reviewed the triage vital signs and the nursing notes.  Pertinent labs & imaging results that were available during my care of the patient were reviewed by me and considered in my medical decision making (see chart for details).     Record reviewed including note from Gentry Fitz, NP, which states he reported having taken his insulin regularly and checking his blood sugar with readings of 130-140. History is contradictory, however, his A1c was over 13 when checked at that appointment and on arrival here his CBG was 'greater  than 600', over 1000 on lab result. No evidence of acidosis. VSS. Will start glucostabilizer and fluids.   Blood sugar has decreased as expected over time. He is feeling better - still asymptomatic. He can be discharged home with close follow up with his doctor next week.   Final Clinical Impressions(s) / ED Diagnoses   Final diagnoses:  None   1. Hyperglycemia   ED Discharge Orders    None       Charlann Lange, PA-C 06/18/17 0152    Daleen Bo, MD 06/18/17 [phone]

## 2017-06-17 NOTE — ED Notes (Signed)
Pt CBG high (>600). Notified [employer] account planner)

## 2017-06-17 NOTE — ED Triage Notes (Signed)
Pt reports high cbg recently despite taking his meds as directed. Denies n/v, thirst, frequent urination. Is having tremors today. Hx of same with hyperglycemia. cbg > 600 at triage. Reports recent change in meds and things he was prescribed wrong insulin.

## 2017-06-18 LAB — BASIC METABOLIC PANEL
Anion gap: 9 (ref 5–15)
BUN: 18 mg/dL (ref 6–20)
CALCIUM: 8.9 mg/dL (ref 8.9–10.3)
CO2: 24 mmol/L (ref 22–32)
Chloride: 97 mmol/L — ABNORMAL LOW (ref 101–111)
Creatinine, Ser: 1.44 mg/dL — ABNORMAL HIGH (ref 0.61–1.24)
GFR calc non Af Amer: 49 mL/min — ABNORMAL LOW (ref >60)
GFR, EST AFRICAN AMERICAN: 57 mL/min — AB (ref >60)
GLUCOSE: 574 mg/dL — AB (ref 65–99)
POTASSIUM: 3.9 mmol/L (ref 3.5–5.1)
SODIUM: 130 mmol/L — AB (ref 135–145)

## 2017-06-18 LAB — CBG MONITORING, ED
GLUCOSE-CAPILLARY: 210 mg/dL — AB (ref 65–99)
GLUCOSE-CAPILLARY: 300 mg/dL — AB (ref 65–99)
GLUCOSE-CAPILLARY: 435 mg/dL — AB (ref 65–99)

## 2017-06-18 NOTE — ED Notes (Addendum)
Pt discharged from ED; instructions provided; Pt encouraged to return to ED if symptoms worsen and to f/u with PCP; Pt verbalized understanding of all instructions 

## 2017-06-19 ENCOUNTER — Telehealth: Payer: Self-pay | Admitting: Primary Care

## 2017-06-19 NOTE — Telephone Encounter (Signed)
See several phone notes regarding patient needing follow up visit as soon as possible. Have him bring in all medications including insulin and blood glucose logs.

## 2017-06-19 NOTE — Telephone Encounter (Signed)
Copied from [place]. Topic: Quick Communication - See Telephone Encounter >> Jun 19, 2017  8:34 AM [place], Laban Emperor wrote: CRM for notification. See Telephone encounter for:  Pt states needs the provider to call in his insulin fact acting 70/30 into [place], [place] - [place] [phone] (Phone) [phone] (Fax)  He also states he had to go to the hospital Saturday night b/c he was shaking real bad. 06/19/17.

## 2017-06-19 NOTE — Telephone Encounter (Signed)
Message left for patient to return my call.  

## 2017-06-20 ENCOUNTER — Telehealth: Payer: Self-pay

## 2017-06-20 NOTE — Telephone Encounter (Signed)
Noted  

## 2017-06-20 NOTE — Telephone Encounter (Signed)
Jonathan Page, will you please try to reach this patient?

## 2017-06-20 NOTE — Telephone Encounter (Signed)
See other phone notes. Patient needs office visit ASAP.

## 2017-06-20 NOTE — Telephone Encounter (Signed)
I spoke with pt and he scheduled appt with Allie Bossier NP 06/21/17 at 4:15 per Anda Kraft and pt will bring meds and insulin and diabetic log. FYI to [place].

## 2017-06-20 NOTE — Telephone Encounter (Signed)
Lm on both of pts vm requesting a call back to scheduled. Provided pt with avail appt times

## 2017-06-20 NOTE — Telephone Encounter (Signed)
Patient needs to be seen ASAP, please schedule. I can add him on as a 4:15 Wednesday afternoon or on Thursday. We really need to discuss his insulin and hospital visit. I've been trying to get a hold of him for several days. I will excuse him from work, tell him not to worry but I need to see him tomorrow or Thursday. Please have him bring ALL medications including insulin, and glucose logs! Thanks.

## 2017-06-20 NOTE — Telephone Encounter (Signed)
Copied from [place] [id number]. Topic: Inquiry >> Jun 20, 2017  8:27 AM Pricilla Handler wrote: Reason for CRM: Patient called requesting a return to work note. Patient stated that he saw Allie Bossier on last week. Patient has been out of work last week and yesterday. Patient wants to return to work today if possible. Please call patient. Patient would like to come by this morning to pick up the letter.       Thank You!!!

## 2017-06-20 NOTE — Telephone Encounter (Signed)
Pt has 07/2017 appt

## 2017-06-21 ENCOUNTER — Ambulatory Visit (INDEPENDENT_AMBULATORY_CARE_PROVIDER_SITE_OTHER): Payer: Medicare HMO | Admitting: Primary Care

## 2017-06-21 ENCOUNTER — Encounter: Payer: Self-pay | Admitting: Primary Care

## 2017-06-21 VITALS — BP 124/70 | HR 84 | Temp 98.1°F | Wt 207.5 lb

## 2017-06-21 DIAGNOSIS — E1165 Type 2 diabetes mellitus with hyperglycemia: Secondary | ICD-10-CM

## 2017-06-21 DIAGNOSIS — E785 Hyperlipidemia, unspecified: Secondary | ICD-10-CM

## 2017-06-21 DIAGNOSIS — Z794 Long term (current) use of insulin: Secondary | ICD-10-CM | POA: Diagnosis not present

## 2017-06-21 MED ORDER — LISINOPRIL 5 MG PO TABS: ORAL_TABLET | ORAL | 3 refills | Status: DC

## 2017-06-21 NOTE — Assessment & Plan Note (Addendum)
Recent emergency department visit for hyperglycemia with glucose over 1000.  Glucose decreased to 210 prior to discharge.  Long discussion today with patient regarding compliance and providing correct information during appointments.  Clarified that he is on NPH 70/30 and is injecting 20 units twice daily.  Blood glucose levels from home seem much improved, will continue 20 units twice daily for now.  He will continue to monitor sugars and report continued readings over 150.  Recent urine microalbumin positive, lisinopril 5 mg tablet sent to pharmacy for renal protection.  He will need a BMP at his next office visit.  We will have him follow-up in 6 weeks for reevaluation, he was instructed to bring all glucose logs and medication.

## 2017-06-21 NOTE — Progress Notes (Signed)
Subjective:    Patient ID: Jonathan Page, male    DOB: Jan 28, 1951, 67 y.o.   MRN: [id number]  HPI  Jonathan Page is 67 year old male who presents today for emergency department follow up and diabetes follow up.  He presented to [facility] ED on 06/17/17 with a chief complaint of hyperglycemia and tremors.  He was in our office several days before endorsing glucose readings of 130-140 on average and also endorsed compliance to insulin.  During his stay in the emergency department he reported he been off of his insulin for 2 months.  His initial blood sugar check was over 1000.  He was initiated on glucose stabilizer and IV fluids.  Throughout his stay his glucose levels decreased to 210 so he was discharged home later the next morning.  Since his emergency department stay he's checking his blood sugars 5-6 times daily: 06/18/17 7pm 442 06/19/17 9am 178 06/19/17 2pm 156 06/19/17 4 pm 162 06/21/17 1 pm 167  He's currently injecting 20 units of Relion 70/30 twice daily now.  He endorses since November he's been injecting a "smaller dose". Over the holidays he was eating a poor diet, drinking alcohol, and was not exercising. He changed his diet two days ago and is consuming baked lean protein, vegetables, limited carbs.  He denies dizziness, tremor, weakness, fatigue.   Review of Systems  Constitutional: Negative for fatigue.  Respiratory: Negative for shortness of breath.   Cardiovascular: Negative for chest pain.  Neurological: Negative for dizziness, tremors and weakness.       Past Medical History:  Diagnosis Date  . Concussion syndrome   . Diabetes ([place])   . Frequent headaches   . Hypertension      Social History   Socioeconomic History  . Marital status: Divorced    Spouse name: Not on file  . Number of children: Not on file  . Years of education: Not on file  . Highest education level: Not on file  Social Needs  . Financial resource strain: Not on file  . Food insecurity -  worry: Not on file  . Food insecurity - inability: Not on file  . Transportation needs - medical: Not on file  . Transportation needs - non-medical: Not on file  Occupational History  . Not on file  Tobacco Use  . Smoking status: Current Some Day Smoker    Types: Cigarettes  . Smokeless tobacco: Never Used  Substance and Sexual Activity  . Alcohol use: Yes    Alcohol/week: 0.0 oz    Comment: occasional beer  . Drug use: No  . Sexual activity: Not on file  Other Topics Concern  . Not on file  Social History Narrative   Divorced.   2 children, 1 grandchild.   Retired. Once worked as a Catering manager.    Enjoys relaxing.     Past Surgical History:  Procedure Laterality Date  . APPENDECTOMY  1960    Family History  Problem Relation Age of Onset  . Diabetes Father   . Diabetes Paternal Grandfather   . Diabetes Paternal Grandmother     No Known Allergies  Current Outpatient Medications on File Prior to Visit  Medication Sig Dispense Refill  . aspirin EC 81 MG tablet Take 81 mg by mouth daily.    . Insulin NPH Isophane & Regular (RELION 70/30 [place]) Inject 20 Units into the skin 2 (two) times daily.    Marland Kitchen atorvastatin (LIPITOR) 40 MG tablet Take 1 tablet (  40 mg total) by mouth daily. (Patient not taking: Reported on 06/17/2017) 90 tablet 0   No current facility-administered medications on file prior to visit.     BP 124/70   Pulse 84   Temp 98.1 F (36.7 C) (Oral)   Wt 207 lb 8 oz (94.1 kg)   BMI 27.95 kg/m    Objective:   Physical Exam  Constitutional: He is oriented to person, place, and time. He appears well-nourished.  Neck: Neck supple.  Cardiovascular: Normal rate and regular rhythm.  Pulmonary/Chest: Effort normal and breath sounds normal. He has no wheezes. He has no rales.  Neurological: He is alert and oriented to person, place, and time.  Skin: Skin is warm and dry.  Psychiatric: He has a normal mood and affect.          Assessment & Plan:

## 2017-06-21 NOTE — Assessment & Plan Note (Signed)
Endorses no atorvastatin for several months.  Repeat lipids pending today.  We will likely reinitiate.

## 2017-06-21 NOTE — Patient Instructions (Signed)
Stop by the lab prior to leaving today. I will notify you of your results once received.   Continue Relion Novolin 70/30, 20 units twice daily.  Check your blood sugars: Before breakfast Before Lunch Bedtime  Follow up in February as scheduled, bring your sugar logs and insulin bottle.   Please call me sooner if blood sugars run above 150 consistently.  It was a pleasure to see you today!   Diabetes Mellitus and Nutrition When you have diabetes (diabetes mellitus), it is very important to have healthy eating habits because your blood sugar (glucose) levels are greatly affected by what you eat and drink. Eating healthy foods in the appropriate amounts, at about the same times every day, can help you:  Control your blood glucose.  Lower your risk of heart disease.  Improve your blood pressure.  Reach or maintain a healthy weight.  Every person with diabetes is different, and each person has different needs for a meal plan. Your health care provider may recommend that you work with a diet and nutrition specialist (dietitian) to make a meal plan that is best for you. Your meal plan may vary depending on factors such as:  The calories you need.  The medicines you take.  Your weight.  Your blood glucose, blood pressure, and cholesterol levels.  Your activity level.  Other health conditions you have, such as heart or kidney disease.  How do carbohydrates affect me? Carbohydrates affect your blood glucose level more than any other type of food. Eating carbohydrates naturally increases the amount of glucose in your blood. Carbohydrate counting is a method for keeping track of how many carbohydrates you eat. Counting carbohydrates is important to keep your blood glucose at a healthy level, especially if you use insulin or take certain oral diabetes medicines. It is important to know how many carbohydrates you can safely have in each meal. This is different for every person. Your  dietitian can help you calculate how many carbohydrates you should have at each meal and for snack. Foods that contain carbohydrates include:  Bread, cereal, rice, pasta, and crackers.  Potatoes and corn.  Peas, beans, and lentils.  Milk and yogurt.  Fruit and juice.  Desserts, such as cakes, cookies, ice cream, and candy.  How does alcohol affect me? Alcohol can cause a sudden decrease in blood glucose (hypoglycemia), especially if you use insulin or take certain oral diabetes medicines. Hypoglycemia can be a life-threatening condition. Symptoms of hypoglycemia (sleepiness, dizziness, and confusion) are similar to symptoms of having too much alcohol. If your health care provider says that alcohol is safe for you, follow these guidelines:  Limit alcohol intake to no more than 1 drink per day for nonpregnant women and 2 drinks per day for men. One drink equals 12 oz of beer, 5 oz of wine, or 1 oz of hard liquor.  Do not drink on an empty stomach.  Keep yourself hydrated with water, diet soda, or unsweetened iced tea.  Keep in mind that regular soda, juice, and other mixers may contain a lot of sugar and must be counted as carbohydrates.  What are tips for following this plan? Reading food labels  Start by checking the serving size on the label. The amount of calories, carbohydrates, fats, and other nutrients listed on the label are based on one serving of the food. Many foods contain more than one serving per package.  Check the total grams (g) of carbohydrates in one serving. You can calculate the  number of servings of carbohydrates in one serving by dividing the total carbohydrates by 15. For example, if a food has 30 g of total carbohydrates, it would be equal to 2 servings of carbohydrates.  Check the number of grams (g) of saturated and trans fats in one serving. Choose foods that have low or no amount of these fats.  Check the number of milligrams (mg) of sodium in one  serving. Most people should limit total sodium intake to less than 2,300 mg per day.  Always check the nutrition information of foods labeled as "low-fat" or "nonfat". These foods may be higher in added sugar or refined carbohydrates and should be avoided.  Talk to your dietitian to identify your daily goals for nutrients listed on the label. Shopping  Avoid buying canned, premade, or processed foods. These foods tend to be high in fat, sodium, and added sugar.  Shop around the outside edge of the grocery store. This includes fresh fruits and vegetables, bulk grains, fresh meats, and fresh dairy. Cooking  Use low-heat cooking methods, such as baking, instead of high-heat cooking methods like deep frying.  Cook using healthy oils, such as olive, canola, or sunflower oil.  Avoid cooking with butter, cream, or high-fat meats. Meal planning  Eat meals and snacks regularly, preferably at the same times every day. Avoid going long periods of time without eating.  Eat foods high in fiber, such as fresh fruits, vegetables, beans, and whole grains. Talk to your dietitian about how many servings of carbohydrates you can eat at each meal.  Eat 4-6 ounces of lean protein each day, such as lean meat, chicken, fish, eggs, or tofu. 1 ounce is equal to 1 ounce of meat, chicken, or fish, 1 egg, or 1/4 cup of tofu.  Eat some foods each day that contain healthy fats, such as avocado, nuts, seeds, and fish. Lifestyle   Check your blood glucose regularly.  Exercise at least 30 minutes 5 or more days each week, or as told by your health care provider.  Take medicines as told by your health care provider.  Do not use any products that contain nicotine or tobacco, such as cigarettes and e-cigarettes. If you need help quitting, ask your health care provider.  Work with a Social worker or diabetes educator to identify strategies to manage stress and any emotional and social challenges. What are some  questions to ask my health care provider?  Do I need to meet with a diabetes educator?  Do I need to meet with a dietitian?  What number can I call if I have questions?  When are the best times to check my blood glucose? Where to find more information:  American Diabetes Association: diabetes.org/food-and-fitness/food  Academy of Nutrition and Dietetics: PokerClues.dk  [employer] of Diabetes and Digestive and Kidney Diseases (NIH): ContactWire.be Summary  A healthy meal plan will help you control your blood glucose and maintain a healthy lifestyle.  Working with a diet and nutrition specialist (dietitian) can help you make a meal plan that is best for you.  Keep in mind that carbohydrates and alcohol have immediate effects on your blood glucose levels. It is important to count carbohydrates and to use alcohol carefully. This information is not intended to replace advice given to you by your health care provider. Make sure you discuss any questions you have with your health care provider. Document Released: 02/24/2005 Document Revised: 07/04/2016 Document Reviewed: 07/04/2016 Elsevier Interactive Patient Education  [employer].

## 2017-06-22 ENCOUNTER — Telehealth: Payer: Self-pay

## 2017-06-22 ENCOUNTER — Other Ambulatory Visit: Payer: Self-pay | Admitting: Primary Care

## 2017-06-22 DIAGNOSIS — E785 Hyperlipidemia, unspecified: Secondary | ICD-10-CM

## 2017-06-22 LAB — LIPID PANEL
CHOL/HDL RATIO: 5
CHOLESTEROL: 177 mg/dL (ref 0–200)
HDL: 33.9 mg/dL — AB (ref >39.00)
NonHDL: 143.16
TRIGLYCERIDES: 375 mg/dL — AB (ref 0.0–149.0)
VLDL: 75 mg/dL — AB (ref 0.0–40.0)

## 2017-06-22 LAB — LDL CHOLESTEROL, DIRECT: Direct LDL: 105 mg/dL

## 2017-06-22 MED ORDER — ATORVASTATIN CALCIUM 20 MG PO TABS: 20.0000 mg | ORAL_TABLET | Freq: Every evening | ORAL | 3 refills | Status: DC

## 2017-06-22 NOTE — Telephone Encounter (Signed)
Copied from [place] [id number]. Topic: General - Other >> Jun 22, 2017  9:58 AM Aurelio Brash B wrote: Reason for CRM: PT called to let Allie Bossier know his fasting blood sugar this morning at 9:55 was 105

## 2017-06-22 NOTE — Telephone Encounter (Signed)
Noted  

## 2017-06-23 ENCOUNTER — Inpatient Hospital Stay: Payer: Medicare HMO | Admitting: Primary Care

## 2017-07-05 ENCOUNTER — Other Ambulatory Visit: Payer: Self-pay | Admitting: *Deleted

## 2017-07-05 DIAGNOSIS — E785 Hyperlipidemia, unspecified: Secondary | ICD-10-CM

## 2017-07-05 MED ORDER — ATORVASTATIN CALCIUM 20 MG PO TABS: 20.0000 mg | ORAL_TABLET | Freq: Every evening | ORAL | 3 refills | Status: DC

## 2017-07-13 ENCOUNTER — Telehealth: Payer: Self-pay | Admitting: Primary Care

## 2017-07-13 MED ORDER — GLUCOSE BLOOD VI STRP: ORAL_STRIP | 1 refills | Status: DC

## 2017-07-13 MED ORDER — ACCU-CHEK SOFT TOUCH LANCETS MISC: 1 refills | Status: DC

## 2017-07-13 MED ORDER — ACCU-CHEK AVIVA PLUS W/DEVICE KIT: PACK | 0 refills | Status: DC

## 2017-07-13 NOTE — Telephone Encounter (Signed)
Copied from [place]. Topic: Quick Communication - See Telephone Encounter >> Jul 13, 2017  4:53 PM Percell Belt A wrote: CRM for notification. See Telephone encounter for: [facility] pharmacy called in and pt is need script sent over for all diabetic supplies, and meter.  Accucheck avia plus    Best number [phone]   07/13/17.

## 2017-07-13 NOTE — Telephone Encounter (Signed)
accucheck avia plus meter,strips and lancets sent to [facility]. Pt seen 06/2017.

## 2017-07-28 ENCOUNTER — Encounter: Payer: Self-pay | Admitting: Primary Care

## 2017-07-28 ENCOUNTER — Ambulatory Visit (INDEPENDENT_AMBULATORY_CARE_PROVIDER_SITE_OTHER): Payer: Medicare HMO | Admitting: Primary Care

## 2017-07-28 ENCOUNTER — Telehealth: Payer: Self-pay | Admitting: Primary Care

## 2017-07-28 VITALS — BP 120/76 | HR 82 | Temp 97.6°F | Ht 72.25 in | Wt 212.8 lb

## 2017-07-28 DIAGNOSIS — I10 Essential (primary) hypertension: Secondary | ICD-10-CM

## 2017-07-28 DIAGNOSIS — Z794 Long term (current) use of insulin: Secondary | ICD-10-CM

## 2017-07-28 DIAGNOSIS — E785 Hyperlipidemia, unspecified: Secondary | ICD-10-CM

## 2017-07-28 DIAGNOSIS — E1165 Type 2 diabetes mellitus with hyperglycemia: Secondary | ICD-10-CM

## 2017-07-28 LAB — BASIC METABOLIC PANEL
BUN: 20 mg/dL (ref 6–23)
CO2: 29 mEq/L (ref 19–32)
Calcium: 9.3 mg/dL (ref 8.4–10.5)
Chloride: 105 mEq/L (ref 96–112)
Creatinine, Ser: 1.51 mg/dL — ABNORMAL HIGH (ref 0.40–1.50)
GFR: 59.72 mL/min — ABNORMAL LOW (ref >60.00)
GLUCOSE: 90 mg/dL (ref 70–99)
POTASSIUM: 3.8 meq/L (ref 3.5–5.1)
Sodium: 141 mEq/L (ref 135–145)

## 2017-07-28 MED ORDER — LISINOPRIL-HYDROCHLOROTHIAZIDE 20-25 MG PO TABS: 1.0000 | ORAL_TABLET | Freq: Every day | ORAL | 0 refills | Status: DC

## 2017-07-28 MED ORDER — ATORVASTATIN CALCIUM 40 MG PO TABS: 40.0000 mg | ORAL_TABLET | Freq: Every evening | ORAL | 3 refills | Status: DC

## 2017-07-28 NOTE — Assessment & Plan Note (Signed)
Blood glucose levels much more stable, continue Novolin 70/30 20 units BID. Continue to work on diet and exercise. Repeat A1C in early April 2019.

## 2017-07-28 NOTE — Telephone Encounter (Signed)
Tried to call [facility] this morning and afternoon. Tried both times no success in getting someone, kept getting loop back to the waiting status.

## 2017-07-28 NOTE — Assessment & Plan Note (Addendum)
Very unaware of medications that he's taking, has actually been taking lisinopril/HCTZ 20-25, will continue this for now, discontinue lisinopril 5 mg. Check BMP today.

## 2017-07-28 NOTE — Patient Instructions (Addendum)
Continue Novlin 70/30 insulin and inject 20 units twice daily.  Continue lisinopril-HCTZ 20-25 mg tablets for high blood pressure.  Continue atorvastatin 40 mg tablets for high cholesterol.   Continue to work on [employer], continue regular exercise.  Stop by the lab prior to leaving today. I will notify you of your results once received.   Schedule a follow up visit in mid April 2019 with labs one week prior. Make sure you've not eaten anything before your lab appointment.  It was a pleasure to see you today!

## 2017-07-28 NOTE — Telephone Encounter (Signed)
Spoke with pharmacy staff who discontinued lisinopril 5 mg and atorvastatin 20 mg.

## 2017-07-28 NOTE — Assessment & Plan Note (Signed)
Actually taking atorvastatin 40 mg rather than 20 mg. Continue atorvastatin 40 mg, discontinue 20 mg. Repeat lipids in April 2019.

## 2017-07-28 NOTE — Telephone Encounter (Signed)
Please call [place] and discontinue lisinopril 5 mg, he is taking lisinopril-HCTZ 20-25 mg. Also discontinue atorvastatin 20 mg, he is taking atorvastatin 40 mg.

## 2017-07-28 NOTE — Progress Notes (Signed)
Subjective:    Patient ID: Jonathan Page, male    DOB: January 07, 1951, 67 y.o.   MRN: [id number]  HPI  [name] is a 67 year old male who presents today for follow up of diabetes.  Current medications include: Novolin 70/30 20 units twice daily.   He is checking his blood glucose 3-4 times daily and is getting readings of: AM fasting: low 100's Before: 120's Bedtime: 130's-140's Highest reading: 150 Lowest reading: 75  Last A1C: 13.1 in January 2019 Last Eye Exam: Completed in June 2018 Last Foot Exam: Completed in January 2019 Pneumonia Vaccination: UTD ACE/ARB: Lisinopril Statin: Atorvastatin  Diet currently consists of:  Breakfast: Oatmeal, cereal  Lunch: Sandwich Dinner: Vegetables, sandwich  Snacks: Deli meat, fruit, yogurt Desserts: None Beverages: Water  Exercise: He is exercising on the treadmill 7 days weekly  2) Essential Hypertension: He brought in his medication bottles today and has actually been taking lisinopril/HCTZ 20-25 mg once daily from another provider rather than lisinopril 5 mg. He denies chest pain, dizziness, cough.   BP Readings from Last 3 Encounters:  07/28/17 120/76  06/21/17 124/70  06/18/17 116/72    3) Hyperlipidemia: He brought in his medication bottles and is actually taking atorvastatin 40 mg from another provider. He denies myalgias.       Review of Systems  Eyes: Negative for visual disturbance.  Respiratory: Negative for shortness of breath.   Cardiovascular: Negative for chest pain.  Neurological: Negative for dizziness, numbness and headaches.       Past Medical History:  Diagnosis Date  . Concussion syndrome   . Diabetes ([place])   . Frequent headaches   . Hypertension      Social History   Socioeconomic History  . Marital status: Divorced    Spouse name: Not on file  . Number of children: Not on file  . Years of education: Not on file  . Highest education level: Not on file  Social Needs  . Financial  resource strain: Not on file  . Food insecurity - worry: Not on file  . Food insecurity - inability: Not on file  . Transportation needs - medical: Not on file  . Transportation needs - non-medical: Not on file  Occupational History  . Not on file  Tobacco Use  . Smoking status: Current Some Day Smoker    Types: Cigarettes  . Smokeless tobacco: Never Used  Substance and Sexual Activity  . Alcohol use: Yes    Alcohol/week: 0.0 oz    Comment: occasional beer  . Drug use: No  . Sexual activity: Not on file  Other Topics Concern  . Not on file  Social History Narrative   Divorced.   2 children, 1 grandchild.   Retired. Once worked as a Catering manager.    Enjoys relaxing.     Past Surgical History:  Procedure Laterality Date  . APPENDECTOMY  1960    Family History  Problem Relation Age of Onset  . Diabetes Father   . Diabetes Paternal Grandfather   . Diabetes Paternal Grandmother     No Known Allergies  Current Outpatient Medications on File Prior to Visit  Medication Sig Dispense Refill  . aspirin EC 81 MG tablet Take 81 mg by mouth daily.    . Blood Glucose Monitoring Suppl (ACCU-CHEK AVIVA PLUS) w/Device KIT Check blood sugar before breakfast, lunch and bedtime and as directed. Dx E11.65 1 kit 0  . glucose blood (ACCU-CHEK AVIVA PLUS) test strip Check  blood sugar before breakfast, lunch and bedtime and as directed. Dx E11.65 300 each 1  . Insulin NPH Isophane & Regular (RELION 70/30 [place]) Inject 20 Units into the skin 2 (two) times daily.    . Lancets (ACCU-CHEK SOFT TOUCH) lancets Check blood sugar before breakfast, lunch and bedtime and as directed. Dx E11.65 300 each 1   No current facility-administered medications on file prior to visit.     BP 120/76   Pulse 82   Temp 97.6 F (36.4 C) (Oral)   Ht 6' 0.25" (1.835 m)   Wt 212 lb 12.8 oz (96.5 kg)   SpO2 96%   BMI 28.66 kg/m    Objective:   Physical Exam  Constitutional: He is oriented to person, place,  and time. He appears well-nourished.  Neck: Neck supple.  Cardiovascular: Normal rate and regular rhythm.  Pulmonary/Chest: Effort normal and breath sounds normal. He has no wheezes. He has no rales.  Neurological: He is alert and oriented to person, place, and time.  Skin: Skin is warm and dry.  Psychiatric: He has a normal mood and affect.          Assessment & Plan:

## 2017-10-19 ENCOUNTER — Other Ambulatory Visit: Payer: Self-pay | Admitting: Primary Care

## 2017-10-19 DIAGNOSIS — Z1159 Encounter for screening for other viral diseases: Secondary | ICD-10-CM

## 2017-10-19 DIAGNOSIS — E1165 Type 2 diabetes mellitus with hyperglycemia: Secondary | ICD-10-CM

## 2017-10-19 DIAGNOSIS — Z125 Encounter for screening for malignant neoplasm of prostate: Secondary | ICD-10-CM

## 2017-10-19 DIAGNOSIS — E785 Hyperlipidemia, unspecified: Secondary | ICD-10-CM

## 2017-10-20 ENCOUNTER — Ambulatory Visit (INDEPENDENT_AMBULATORY_CARE_PROVIDER_SITE_OTHER): Payer: Medicare HMO

## 2017-10-20 VITALS — BP 142/100 | HR 85 | Temp 97.8°F | Ht 71.5 in | Wt 209.0 lb

## 2017-10-20 DIAGNOSIS — Z125 Encounter for screening for malignant neoplasm of prostate: Secondary | ICD-10-CM | POA: Diagnosis not present

## 2017-10-20 DIAGNOSIS — E785 Hyperlipidemia, unspecified: Secondary | ICD-10-CM | POA: Diagnosis not present

## 2017-10-20 DIAGNOSIS — Z1159 Encounter for screening for other viral diseases: Secondary | ICD-10-CM

## 2017-10-20 DIAGNOSIS — E1165 Type 2 diabetes mellitus with hyperglycemia: Secondary | ICD-10-CM

## 2017-10-20 DIAGNOSIS — Z0000 Encounter for general adult medical examination without abnormal findings: Secondary | ICD-10-CM | POA: Diagnosis not present

## 2017-10-20 LAB — COMPREHENSIVE METABOLIC PANEL
ALBUMIN: 4.3 g/dL (ref 3.5–5.2)
ALK PHOS: 61 U/L (ref 39–117)
ALT: 21 U/L (ref 0–53)
AST: 18 U/L (ref 0–37)
BUN: 15 mg/dL (ref 6–23)
CALCIUM: 9.3 mg/dL (ref 8.4–10.5)
CO2: 29 mEq/L (ref 19–32)
Chloride: 105 mEq/L (ref 96–112)
Creatinine, Ser: 1.5 mg/dL (ref 0.40–1.50)
GFR: 60.13 mL/min (ref >60.00)
Glucose, Bld: 227 mg/dL — ABNORMAL HIGH (ref 70–99)
POTASSIUM: 4.2 meq/L (ref 3.5–5.1)
Sodium: 140 mEq/L (ref 135–145)
TOTAL PROTEIN: 7.1 g/dL (ref 6.0–8.3)
Total Bilirubin: 0.5 mg/dL (ref 0.2–1.2)

## 2017-10-20 LAB — PSA, MEDICARE: PSA: 1.56 ng/ml (ref 0.10–4.00)

## 2017-10-20 LAB — LIPID PANEL
Cholesterol: 136 mg/dL (ref 0–200)
HDL: 43.9 mg/dL (ref >39.00)
LDL Cholesterol: 59 mg/dL (ref 0–99)
NonHDL: 91.77
TRIGLYCERIDES: 166 mg/dL — AB (ref 0.0–149.0)
Total CHOL/HDL Ratio: 3
VLDL: 33.2 mg/dL (ref 0.0–40.0)

## 2017-10-20 LAB — HEMOGLOBIN A1C: HEMOGLOBIN A1C: 6.7 % — AB (ref 4.6–6.5)

## 2017-10-20 NOTE — Progress Notes (Signed)
PCP notes:   Health maintenance:  A1C - completed  Abnormal screenings:   Mini-Cog score: 19/20  Patient concerns:   None  Nurse concerns:  None  Next PCP appt:   10/25/17 @ 0815

## 2017-10-20 NOTE — Progress Notes (Signed)
Subjective:   Jonathan Page is a 67 y.o. male who presents for Medicare Annual/Subsequent preventive examination.  Review of Systems:  N/A Cardiac Risk Factors include: advanced age (>34mn, >>56women);male gender;diabetes mellitus;dyslipidemia     Objective:    Vitals: BP (!) 142/100 (BP Location: Right Arm, Patient Position: Sitting, Cuff Size: Normal) Comment: BP medications not taken  Pulse 85   Temp 97.8 F (36.6 C) (Oral)   Ht 5' 11.5" (1.816 m) Comment: no shoes  Wt 209 lb (94.8 kg)   SpO2 97%   BMI 28.74 kg/m   Body mass index is 28.74 kg/m.  Advanced Directives 10/20/2017 10/19/2016 10/14/2016 10/14/2016 07/06/2016 11/03/2015 11/03/2015  Does Patient Have a Medical Advance Directive? Yes Yes _0   Type of [place]aramed[place]of [place] Village[place] will [place] will - - - - -  Copy of [place]? No - copy requested No - copy requested - - - - -  Would patient like information on creating a medical advance directive? - - No - Patient declined - No - Patient declined No - patient declined information No - patient declined information    Tobacco Social History   Tobacco Use  Smoking Status Current Some Day Smoker  . Types: Cigarettes  Smokeless Tobacco Never Used  Tobacco Comment   2 packs a week     Ready to quit: No Counseling given: No Comment: 2 packs a week   Clinical Intake:  Pre-visit preparation completed: Yes  Pain : No/denies pain Pain Score: 0-No pain     Nutritional Status: BMI 25 -29 Overweight Nutritional Risks: None Diabetes: Yes CBG done?: No(BG 161 in AM) Did pt. bring in CBG monitor from home?: No  How often do you need to have someone help you when you read instructions, pamphlets, or other written materials from your doctor or pharmacy?: 1 - Never What is the last grade level you completed in school?: Bachelors degree  Interpreter Needed?: No  Comments: pt  lives alone Information entered by :: [name], LPN  Past Medical History:  Diagnosis Date  . Concussion syndrome   . [place]   . Frequent headaches   . Hypertension    Past Surgical History:  Procedure Laterality Date  . APPENDECTOMY  1960   Family History  Problem Relation Age of Onset  . Diabetes Father   . Diabetes Paternal Grandfather   . Diabetes Paternal Grandmother    Social History   Socioeconomic History  . Marital status: Divorced    Spouse name: Not on file  . Number of children: Not on file  . Years of education: Not on file  . Highest education level: Not on file  Occupational History  . Not on file  Social Needs  . Financial resource strain: Not on file  . Food insecurity:    Worry: Not on file    Inability: Not on file  . Transportation needs:    Medical: Not on file    Non-medical: Not on file  Tobacco Use  . Smoking status: Current Some Day Smoker    Types: Cigarettes  . Smokeless tobacco: Never Used  . Tobacco comment: 2 packs a week  Substance and Sexual Activity  . Alcohol use: Yes    Alcohol/week: 7.2 - 10.8 oz    Types: 12 - 18 Cans of beer per week  . Drug use: No  . Sexual activity: Not on file  Lifestyle  . Physical activity:    Days per week: Not on file    Minutes per session: Not on file  . Stress: Not on file  Relationships  . Social connections:    Talks on phone: Not on file    Gets together: Not on file    Attends religious service: Not on file    Active member of club or organization: Not on file    Attends meetings of clubs or organizations: Not on file    Relationship status: Not on file  Other Topics Concern  . Not on file  Social History Narrative   Divorced.   2 children, 1 grandchild.   Retired. Once worked as a Catering manager.    Enjoys relaxing.     Outpatient Encounter Medications as of 10/20/2017  Medication Sig  . aspirin EC 81 MG tablet Take 81 mg by mouth daily.  Marland Kitchen atorvastatin (LIPITOR) 40  MG tablet Take 1 tablet (40 mg total) by mouth every evening.  . Blood Glucose Monitoring Suppl (ACCU-CHEK AVIVA PLUS) w/Device KIT Check blood sugar before breakfast, lunch and bedtime and as directed. Dx E11.65  . glucose blood (ACCU-CHEK AVIVA PLUS) test strip Check blood sugar before breakfast, lunch and bedtime and as directed. Dx E11.65  . Insulin NPH Isophane & Regular (RELION 70/30 [place]) Inject 20 Units into the skin 2 (two) times daily.  . Lancets (ACCU-CHEK SOFT TOUCH) lancets Check blood sugar before breakfast, lunch and bedtime and as directed. Dx E11.65  . lisinopril-hydrochlorothiazide (PRINZIDE,ZESTORETIC) 20-25 MG tablet Take 1 tablet by mouth daily.   No facility-administered encounter medications on file as of 10/20/2017.     Activities of Daily Living In your present state of health, do you have any difficulty performing the following activities: 10/20/2017  Hearing? Y  Comment tinnitus  Vision? N  Difficulty concentrating or making decisions? N  Walking or climbing stairs? N  Dressing or bathing? N  Doing errands, shopping? N  Preparing Food and eating ? N  Using the Toilet? N  In the past six months, have you accidently leaked urine? N  Do you have problems with loss of bowel control? N  Managing your Medications? N  Managing your Finances? N  Housekeeping or managing your Housekeeping? N  Some recent data might be hidden    Patient Care Team: [name], NP as PCP - General (Internal Medicine)   Assessment:   This is a routine wellness examination for Ed[place].   Hearing Screening   125Hz 250Hz 500Hz 1000Hz 2000Hz 3000Hz 4000Hz 6000Hz 8000Hz  Right ear:   40 40 40  40    Left ear:   40 40 40  40    Vision Screening Comments: Last vision exam in Nov 2018 @ [facility]   Exercise Activities and Dietary recommendations Current Exercise Habits: Home exercise routine, Type of exercise: walking;strength training/weights;treadmill;stretching;yoga, Time  (Minutes): 60(60-90 minutes), Frequency (Times/Week): 7, Weekly Exercise (Minutes/Week): 420, Intensity: Moderate, Exercise limited by: None identified  Goals    . Increase physical activity     Starting 10/20/2017, I will continue to exercise for 60-90 minutes daily.        Fall Risk Fall Risk  10/20/2017 10/19/2016  Falls in the past year? No Yes  Number falls in past yr: - 1  Injury with Fall? - Yes  Risk for fall due to : - Other (Comment)  Risk for fall due to: Comment - diabetic   Depression Screen  PHQ 2/9 Scores 10/20/2017 10/19/2016  PHQ - 2 Score 0 0  PHQ- 9 Score 0 -    Cognitive Function MMSE - Mini Mental State Exam 10/20/2017 10/19/2016  Orientation to time 5 5  Orientation to Place 5 5  Registration 3 3  Attention/ Calculation 0 0  Recall 2 3  Language- name 2 objects 0 0  Language- repeat 1 1  Language- follow 3 step command 3 3  Language- read & follow direction 0 0  Write a sentence 0 0  Copy design 0 0  Total score 19 20       PLEASE NOTE: A Mini-Cog screen was completed. Maximum score is 20. A value of 0 denotes this part of [place] MMSE was not completed or the patient failed this part of the Mini-Cog screening.   Mini-Cog Screening Orientation to Time - Max 5 pts Orientation to Place - Max 5 pts Registration - Max 3 pts Recall - Max 3 pts Language Repeat - Max 1 pts Language Follow 3 Step Command - Max 3 pts   Immunization History  Administered Date(s) Administered  . Pneumococcal Conjugate-13 06/20/2016  . Pneumococcal Polysaccharide-23 03/13/2014    Screening Tests Health Maintenance  Topic Date Due  . TETANUS/TDAP  10/20/2026 (Originally 05/07/1970)  . OPHTHALMOLOGY EXAM  11/21/2017  . INFLUENZA VACCINE  01/11/2018  . HEMOGLOBIN A1C  04/22/2018  . FOOT EXAM  06/16/2018  . PNA vac Low Risk Adult (2 of 2 - PPSV23) 03/14/2019  . COLONOSCOPY  06/13/2020  . Hepatitis C Screening  Completed       Plan:     I have personally reviewed,  addressed, and noted the following in the patient's chart:  A. Medical and social history B. Use of alcohol, tobacco or illicit drugs  C. Current medications and supplements D. Functional ability and status E.  Nutritional status F.  Physical activity G. Advance directives H. List of other physicians I.  Hospitalizations, surgeries, and ER visits in previous 12 months J.  [place] to include hearing, vision, cognitive, depression L. Referrals and appointments - none  In addition, I have reviewed and discussed with patient certain preventive protocols, quality metrics, and best practice recommendations. A written personalized care plan for preventive services as well as general preventive health recommendations were provided to patient.  See attached scanned questionnaire for additional information.   Signed,   Lindell Noe, MHA, BS, LPN Health Coach

## 2017-10-20 NOTE — Patient Instructions (Addendum)
Jonathan Page , Thank you for taking time to come for your Medicare Wellness Visit. I appreciate your ongoing commitment to your health goals. Please review the following plan we discussed and let me know if I can assist you in the future.   These are the goals we discussed: Goals    . Increase physical activity     Starting 10/20/2017, I will continue to exercise for 60-90 minutes daily.        This is a list of the screening recommended for you and due dates:  Health Maintenance  Topic Date Due  . Tetanus Vaccine  10/20/2026*  . Eye exam for diabetics  11/21/2017  . Flu Shot  01/11/2018  . Hemoglobin A1C  04/22/2018  . Complete foot exam   06/16/2018  . Pneumonia vaccines (2 of 2 - PPSV23) 03/14/2019  . Colon Cancer Screening  06/13/2020  .  Hepatitis C: One time screening is recommended by Center for Disease Control  (CDC) for  adults born from [birth year] through 1965.   Completed  *Topic was postponed. The date shown is not the original due date.   Preventive Care for Adults  A healthy lifestyle and preventive care can promote health and wellness. Preventive health guidelines for adults include the following key practices.  . A routine yearly physical is a good way to check with your health care provider about your health and preventive screening. It is a chance to share any concerns and updates on your health and to receive a thorough exam.  . Visit your dentist for a routine exam and preventive care every 6 months. Brush your teeth twice a day and floss once a day. Good oral hygiene prevents tooth decay and gum disease.  . The frequency of eye exams is based on your age, health, family medical history, use  of contact lenses, and other factors. Follow your health care provider's recommendations for frequency of eye exams.  . Eat a healthy diet. Foods like vegetables, fruits, whole grains, low-fat dairy products, and lean protein foods contain the nutrients you need without too many  calories. Decrease your intake of foods high in solid fats, added sugars, and salt. Eat the right amount of calories for you. Get information about a proper diet from your health care provider, if necessary.  . Regular physical exercise is one of the most important things you can do for your health. Most adults should get at least 150 minutes of moderate-intensity exercise (any activity that increases your heart rate and causes you to sweat) each week. In addition, most adults need muscle-strengthening exercises on 2 or more days a week.  Silver Sneakers may be a benefit available to you. To determine eligibility, you may visit the website: www.silversneakers.com or contact program at 586-588-3037 Mon-Fri between 8AM-8PM.   . Maintain a healthy weight. The body mass index (BMI) is a screening tool to identify possible weight problems. It provides an estimate of body fat based on height and weight. Your health care provider can find your BMI and can help you achieve or maintain a healthy weight.   For adults 20 years and older: ? A BMI below 18.5 is considered underweight. ? A BMI of 18.5 to 24.9 is normal. ? A BMI of 25 to 29.9 is considered overweight. ? A BMI of 30 and above is considered obese.   . Maintain normal blood lipids and cholesterol levels by exercising and minimizing your intake of saturated fat. Eat a balanced diet with  plenty of fruit and vegetables. Blood tests for lipids and cholesterol should begin at age 81 and be repeated every 5 years. If your lipid or cholesterol levels are high, you are over 50, or you are at high risk for heart disease, you may need your cholesterol levels checked more frequently. Ongoing high lipid and cholesterol levels should be treated with medicines if diet and exercise are not working.  . If you smoke, find out from your health care provider how to quit. If you do not use tobacco, please do not start.  . If you choose to drink alcohol, please do  not consume more than 2 drinks per day. One drink is considered to be 12 ounces (355 mL) of beer, 5 ounces (148 mL) of wine, or 1.5 ounces (44 mL) of liquor.  . If you are 68-39 years old, ask your health care provider if you should take aspirin to prevent strokes.  . Use sunscreen. Apply sunscreen liberally and repeatedly throughout the day. You should seek shade when your shadow is shorter than you. Protect yourself by wearing long sleeves, pants, a wide-brimmed hat, and sunglasses year round, whenever you are outdoors.  . Once a month, do a whole body skin exam, using a mirror to look at the skin on your back. Tell your health care provider of new moles, moles that have irregular borders, moles that are larger than a pencil eraser, or moles that have changed in shape or color.

## 2017-10-21 LAB — HEPATITIS C ANTIBODY
Hepatitis C Ab: NONREACTIVE
SIGNAL TO CUT-OFF: 0.01 (ref <1.00)

## 2017-10-22 NOTE — Progress Notes (Signed)
I reviewed health advisor's note, was available for consultation, and agree with documentation and plan.  

## 2017-10-25 ENCOUNTER — Encounter: Payer: Medicare HMO | Admitting: Primary Care

## 2017-10-25 DIAGNOSIS — Z0289 Encounter for other administrative examinations: Secondary | ICD-10-CM

## 2017-10-27 ENCOUNTER — Telehealth: Payer: Self-pay | Admitting: Primary Care

## 2017-10-27 NOTE — Telephone Encounter (Signed)
error 

## 2017-11-08 ENCOUNTER — Ambulatory Visit (INDEPENDENT_AMBULATORY_CARE_PROVIDER_SITE_OTHER): Payer: Medicare HMO | Admitting: Primary Care

## 2017-11-08 ENCOUNTER — Encounter: Payer: Self-pay | Admitting: Primary Care

## 2017-11-08 VITALS — BP 136/84 | HR 77 | Temp 97.7°F | Ht 71.5 in | Wt 211.2 lb

## 2017-11-08 DIAGNOSIS — N183 Chronic kidney disease, stage 3 unspecified: Secondary | ICD-10-CM

## 2017-11-08 DIAGNOSIS — E785 Hyperlipidemia, unspecified: Secondary | ICD-10-CM | POA: Diagnosis not present

## 2017-11-08 DIAGNOSIS — Z0000 Encounter for general adult medical examination without abnormal findings: Secondary | ICD-10-CM | POA: Diagnosis not present

## 2017-11-08 DIAGNOSIS — Z794 Long term (current) use of insulin: Secondary | ICD-10-CM | POA: Diagnosis not present

## 2017-11-08 DIAGNOSIS — E1165 Type 2 diabetes mellitus with hyperglycemia: Secondary | ICD-10-CM | POA: Diagnosis not present

## 2017-11-08 DIAGNOSIS — Z23 Encounter for immunization: Secondary | ICD-10-CM | POA: Diagnosis not present

## 2017-11-08 DIAGNOSIS — I10 Essential (primary) hypertension: Secondary | ICD-10-CM

## 2017-11-08 MED ORDER — ZOSTER VAC RECOMB ADJUVANTED 50 MCG/0.5ML IM SUSR: 0.5000 mL | Freq: Once | INTRAMUSCULAR | 1 refills | Status: AC

## 2017-11-08 NOTE — Assessment & Plan Note (Signed)
Repeat A1C of 6.7. Suspect this is highly diet related as he's stopped eating fried food and his alcohol consumption has significantly decreased.   Commended him on regular exercise and improvements in diet.   Managed on statin and ACE. Pneumonia vaccination UTD. Foot exam UTD. Eye exam due in June.  Follow up in 6 months for re-evaluation. Continue Relion 70/30, 20 units twice daily.

## 2017-11-08 NOTE — Assessment & Plan Note (Signed)
Pneumonia vaccinations UTD. Shingrix due, Rx printed. PSA UTD. Colonoscopy UTD, he will check with [facility] regarding due date, unable to find in system. Commended him on dietary changes, encouraged regular exercise. Exam unremarkable. Labs stable. Follow up in 1 year for CPE.

## 2017-11-08 NOTE — Progress Notes (Signed)
Subjective:    Patient ID: Jonathan Page, male    DOB: 04-10-1951, 67 y.o.   MRN: [id number]  HPI  Jonathan Page is a 67 year old male who presents today for complete physical. He met with our health advisor in early May 2019.  Immunizations: -Pneumonia: Completed Prevnar 13 in 2018, completed Pneumovax 23 in 2015 -Shingles: Never completed, due.  Diet: He endorses a fair diet Breakfast: Oatmeal, toast Lunch: Vegetables Dinner: Steak, chicken, salads, vegetables, starch  Snacks: Cereal (special K, raisin bran), crackers, fruit, yogurt Desserts: None Beverages: Water, beer, ginger ale,   Exercise: He is exercising at the gyn 4 days weekly, 1.5 hours. Eye exam: Due in June 2019 Dental exam: Scheduled for June. Colonoscopy: Completed in 2014, due now.  PSA: Normal in 2019 Hep C Screen: Negative in 2019  Wt Readings from Last 3 Encounters:  11/08/17 211 lb 4 oz (95.8 kg)  10/20/17 209 lb (94.8 kg)  07/28/17 212 lb 12.8 oz (96.5 kg)    He's checking his glucose twice daily, morning fasting and evening. AM fasting: 125 Bedtime: 130's  Highest reading: 160 Lowest reading: 70  BP Readings from Last 3 Encounters:  11/08/17 136/84  10/20/17 (!) 142/100  07/28/17 120/76     Review of Systems  Constitutional: Negative for unexpected weight change.  HENT: Negative for rhinorrhea.   Respiratory: Negative for cough and shortness of breath.   Cardiovascular: Negative for chest pain.  Gastrointestinal: Negative for constipation and diarrhea.  Genitourinary: Negative for difficulty urinating.  Musculoskeletal: Negative for arthralgias and myalgias.  Skin: Negative for rash.  Allergic/Immunologic: Negative for environmental allergies.  Neurological: Negative for dizziness, numbness and headaches.  Psychiatric/Behavioral: The patient is not nervous/anxious.        Past Medical History:  Diagnosis Date  . Concussion syndrome   . Diabetes ([place])   . Frequent headaches   .  Hypertension      Social History   Socioeconomic History  . Marital status: Divorced    Spouse name: Not on file  . Number of children: Not on file  . Years of education: Not on file  . Highest education level: Not on file  Occupational History  . Not on file  Social Needs  . Financial resource strain: Not on file  . Food insecurity:    Worry: Not on file    Inability: Not on file  . Transportation needs:    Medical: Not on file    Non-medical: Not on file  Tobacco Use  . Smoking status: Current Some Day Smoker    Types: Cigarettes  . Smokeless tobacco: Never Used  . Tobacco comment: 2 packs a week  Substance and Sexual Activity  . Alcohol use: Yes    Alcohol/week: 7.2 - 10.8 oz    Types: 12 - 18 Cans of beer per week  . Drug use: No  . Sexual activity: Not on file  Lifestyle  . Physical activity:    Days per week: Not on file    Minutes per session: Not on file  . Stress: Not on file  Relationships  . Social connections:    Talks on phone: Not on file    Gets together: Not on file    Attends religious service: Not on file    Active member of club or organization: Not on file    Attends meetings of clubs or organizations: Not on file    Relationship status: Not on file  . Intimate  partner violence:    Fear of current or ex partner: Not on file    Emotionally abused: Not on file    Physically abused: Not on file    Forced sexual activity: Not on file  Other Topics Concern  . Not on file  Social History Narrative   Divorced.   2 children, 1 grandchild.   Retired. Once worked as a Catering manager.    Enjoys relaxing.     Past Surgical History:  Procedure Laterality Date  . APPENDECTOMY  1960    Family History  Problem Relation Age of Onset  . Diabetes Father   . Diabetes Paternal Grandfather   . Diabetes Paternal Grandmother     No Known Allergies  Current Outpatient Medications on File Prior to Visit  Medication Sig Dispense Refill  . aspirin  EC 81 MG tablet Take 81 mg by mouth daily.    Marland Kitchen atorvastatin (LIPITOR) 40 MG tablet Take 1 tablet (40 mg total) by mouth every evening. 90 tablet 3  . Blood Glucose Monitoring Suppl (ACCU-CHEK AVIVA PLUS) w/Device KIT Check blood sugar before breakfast, lunch and bedtime and as directed. Dx E11.65 1 kit 0  . glucose blood (ACCU-CHEK AVIVA PLUS) test strip Check blood sugar before breakfast, lunch and bedtime and as directed. Dx E11.65 300 each 1  . Insulin NPH Isophane & Regular (RELION 70/30 [place]) Inject 20 Units into the skin 2 (two) times daily.    . Lancets (ACCU-CHEK SOFT TOUCH) lancets Check blood sugar before breakfast, lunch and bedtime and as directed. Dx E11.65 300 each 1  . lisinopril-hydrochlorothiazide (PRINZIDE,ZESTORETIC) 20-25 MG tablet Take 1 tablet by mouth daily. 90 tablet 0   No current facility-administered medications on file prior to visit.     BP 136/84   Pulse 77   Temp 97.7 F (36.5 C) (Oral)   Ht 5' 11.5" (1.816 m)   Wt 211 lb 4 oz (95.8 kg)   SpO2 95%   BMI 29.05 kg/m    Objective:   Physical Exam  Constitutional: He is oriented to person, place, and time. He appears well-nourished.  HENT:  Mouth/Throat: No oropharyngeal exudate.  Eyes: Pupils are equal, round, and reactive to light. EOM are normal.  Neck: Neck supple. No thyromegaly present.  Cardiovascular: Normal rate and regular rhythm.  Respiratory: Effort normal and breath sounds normal.  GI: Soft. Bowel sounds are normal. There is no tenderness.  Musculoskeletal: Normal range of motion.  Neurological: He is alert and oriented to person, place, and time.  Skin: Skin is warm and dry.  Psychiatric: He has a normal mood and affect.           Assessment & Plan:

## 2017-11-08 NOTE — Patient Instructions (Addendum)
Call [facility] to inquire about your colonoscopy due date.   Continue to work on [employer]. Increase vegetables, fruit, whole grains, lean protein.  Limit alcohol and sugary cereals.  Ensure you are consuming 64 ounces of water daily.  Continue exercising. You should be getting 150 minutes of moderate intensity exercise weekly.  Please schedule a follow up appointment in 6 months for diabetes check.   It was a pleasure to see you today!   Preventive Care 76 Years and Older, Male Preventive care refers to lifestyle choices and visits with your health care provider that can promote health and wellness. What does preventive care include?  A yearly physical exam. This is also called an annual well check.  Dental exams once or twice a year.  Routine eye exams. Ask your health care provider how often you should have your eyes checked.  Personal lifestyle choices, including: ? Daily care of your teeth and gums. ? Regular physical activity. ? Eating a healthy diet. ? Avoiding tobacco and drug use. ? Limiting alcohol use. ? Practicing safe sex. ? Taking low doses of aspirin every day. ? Taking vitamin and mineral supplements as recommended by your health care provider. What happens during an annual well check? The services and screenings done by your health care provider during your annual well check will depend on your age, overall health, lifestyle risk factors, and family history of disease. Counseling Your health care provider may ask you questions about your:  Alcohol use.  Tobacco use.  Drug use.  Emotional well-being.  Home and relationship well-being.  Sexual activity.  Eating habits.  History of falls.  Memory and ability to understand (cognition).  Work and work Statistician.  Screening You may have the following tests or measurements:  Height, weight, and BMI.  Blood pressure.  Lipid and cholesterol levels. These may be checked every 5 years, or  more frequently if you are over 23 years old.  Skin check.  Lung cancer screening. You may have this screening every year starting at age 56 if you have a 30-pack-year history of smoking and currently smoke or have quit within the past 15 years.  Fecal occult blood test (FOBT) of the stool. You may have this test every year starting at age 65.  Flexible sigmoidoscopy or colonoscopy. You may have a sigmoidoscopy every 5 years or a colonoscopy every 10 years starting at age 27.  Prostate cancer screening. Recommendations will vary depending on your family history and other risks.  Hepatitis C blood test.  Hepatitis B blood test.  Sexually transmitted disease (STD) testing.  Diabetes screening. This is done by checking your blood sugar (glucose) after you have not eaten for a while (fasting). You may have this done every 1-3 years.  Abdominal aortic aneurysm (AAA) screening. You may need this if you are a current or former smoker.  Osteoporosis. You may be screened starting at age 70 if you are at high risk.  Talk with your health care provider about your test results, treatment options, and if necessary, the need for more tests. Vaccines Your health care provider may recommend certain vaccines, such as:  Influenza vaccine. This is recommended every year.  Tetanus, diphtheria, and acellular pertussis (Tdap, Td) vaccine. You may need a Td booster every 10 years.  Varicella vaccine. You may need this if you have not been vaccinated.  Zoster vaccine. You may need this after age 61.  Measles, mumps, and rubella (MMR) vaccine. You may need at  least one dose of MMR if you were born in 1957 or later. You may also need a second dose.  Pneumococcal 13-valent conjugate (PCV13) vaccine. One dose is recommended after age 67.  Pneumococcal polysaccharide (PPSV23) vaccine. One dose is recommended after age 59.  Meningococcal vaccine. You may need this if you have certain  conditions.  Hepatitis A vaccine. You may need this if you have certain conditions or if you travel or work in places where you may be exposed to hepatitis A.  Hepatitis B vaccine. You may need this if you have certain conditions or if you travel or work in places where you may be exposed to hepatitis B.  Haemophilus influenzae type b (Hib) vaccine. You may need this if you have certain risk factors.  Talk to your health care provider about which screenings and vaccines you need and how often you need them. This information is not intended to replace advice given to you by your health care provider. Make sure you discuss any questions you have with your health care provider. Document Released: 06/26/2015 Document Revised: 02/17/2016 Document Reviewed: 03/31/2015 Elsevier Interactive Patient Education  [employer].

## 2017-11-08 NOTE — Assessment & Plan Note (Signed)
Repeat lipid panel at goal. Continue atorvastatin at 40 mg.

## 2017-11-08 NOTE — Assessment & Plan Note (Signed)
Stable in the office today, continue current regimen. BMP stable.

## 2017-11-08 NOTE — Assessment & Plan Note (Signed)
Renal function stable. Managed on ACE. Repeat renal function in 6 months.

## 2017-12-26 ENCOUNTER — Encounter: Payer: Medicare HMO | Admitting: Primary Care

## 2018-02-21 ENCOUNTER — Ambulatory Visit (INDEPENDENT_AMBULATORY_CARE_PROVIDER_SITE_OTHER): Payer: Medicare HMO | Admitting: Family Medicine

## 2018-02-21 ENCOUNTER — Encounter: Payer: Self-pay | Admitting: Family Medicine

## 2018-02-21 VITALS — BP 148/90 | HR 86 | Temp 98.1°F | Ht 71.5 in | Wt 207.2 lb

## 2018-02-21 DIAGNOSIS — L0390 Cellulitis, unspecified: Secondary | ICD-10-CM | POA: Insufficient documentation

## 2018-02-21 DIAGNOSIS — L03311 Cellulitis of abdominal wall: Secondary | ICD-10-CM | POA: Diagnosis not present

## 2018-02-21 HISTORY — DX: Cellulitis, unspecified: L03.90

## 2018-02-21 MED ORDER — CEPHALEXIN 250 MG PO CAPS: 250.0000 mg | ORAL_CAPSULE | Freq: Three times a day (TID) | ORAL | 0 refills | Status: DC

## 2018-02-21 NOTE — Patient Instructions (Signed)
Keep the infected area very clean with soap and water  Dry it well  Warm compresses help also  Take the keflex as directed (antibiotic)   If it comes to a head and drains that is ok  If it gets bigger or more painful alert [place]  If increased redness- alert [place] (especially if streaks)   Update if not starting to improve in a week or if worsening

## 2018-02-21 NOTE — Progress Notes (Signed)
 Subjective:    Patient ID: Jonathan Page, male    DOB: 10/13/1950, 66 y.o.   MRN: [id number]  HPI 66 yo pt of NP Clark here with possible skin abscess on abdomen  He injects insulin in abdomen  Had to re use some needles because [employer] would not approve a refill   2 weeks of redness in one area  Now swollen and looks like it had a head  Not draining  Used neosporin  Keeping clean with rubbing alcohol   No fever Feels fine  Blood sugar levels have been quite good   Patient Active Problem List   Diagnosis Date Noted  . Cellulitis 02/21/2018  . Preventative health care 11/08/2017  . Syncope and collapse 10/14/2016  . Prolonged QT interval 10/14/2016  . Hyperlipidemia 01/25/2016  . Renal failure 01/25/2016  . Essential hypertension 10/27/2015  . Type 2 diabetes mellitus with hyperglycemia ([facility]) 10/22/2015   Past Medical History:  Diagnosis Date  . Concussion syndrome   . Diabetes ([facility])   . Frequent headaches   . Hypertension    Past Surgical History:  Procedure Laterality Date  . APPENDECTOMY  1960   Social History   Tobacco Use  . Smoking status: Former Smoker    Types: Cigarettes  . Smokeless tobacco: Never Used  Substance Use Topics  . Alcohol use: Yes    Alcohol/week: 12.0 - 18.0 standard drinks    Types: 12 - 18 Cans of beer per week  . Drug use: No   Family History  Problem Relation Age of Onset  . Diabetes Father   . Diabetes Paternal Grandfather   . Diabetes Paternal Grandmother    No Known Allergies Current Outpatient Medications on File Prior to Visit  Medication Sig Dispense Refill  . aspirin EC 81 MG tablet Take 81 mg by mouth daily.    . atorvastatin (LIPITOR) 40 MG tablet Take 1 tablet (40 mg total) by mouth every evening. 90 tablet 3  . Blood Glucose Monitoring Suppl (ACCU-CHEK AVIVA PLUS) w/Device KIT Check blood sugar before breakfast, lunch and bedtime and as directed. Dx E11.65 1 kit 0  . glucose blood (ACCU-CHEK AVIVA PLUS) test  strip Check blood sugar before breakfast, lunch and bedtime and as directed. Dx E11.65 300 each 1  . Insulin NPH Isophane & Regular (RELION 70/30 [place]) Inject 20 Units into the skin 2 (two) times daily.    . Lancets (ACCU-CHEK SOFT TOUCH) lancets Check blood sugar before breakfast, lunch and bedtime and as directed. Dx E11.65 300 each 1  . lisinopril-hydrochlorothiazide (PRINZIDE,ZESTORETIC) 20-25 MG tablet Take 1 tablet by mouth daily. 90 tablet 0   No current facility-administered medications on file prior to visit.     Review of Systems  Constitutional: Negative for activity change, appetite change, fatigue, fever and unexpected weight change.  HENT: Negative for congestion, rhinorrhea, sore throat and trouble swallowing.   Eyes: Negative for pain, redness, itching and visual disturbance.  Respiratory: Negative for cough, chest tightness, shortness of breath and wheezing.   Cardiovascular: Negative for chest pain and palpitations.  Gastrointestinal: Negative for abdominal pain, blood in stool, constipation, diarrhea and nausea.  Endocrine: Negative for cold intolerance, heat intolerance, polydipsia and polyuria.  Genitourinary: Negative for difficulty urinating, dysuria, frequency and urgency.  Musculoskeletal: Negative for arthralgias, joint swelling and myalgias.  Skin: Positive for wound. Negative for pallor and rash.  Neurological: Negative for dizziness, tremors, weakness, numbness and headaches.  Hematological: Negative for adenopathy. Does not bruise/bleed   easily.  Psychiatric/Behavioral: Negative for decreased concentration and dysphoric mood. The patient is not nervous/anxious.        Objective:   Physical Exam  Constitutional: He appears well-developed and well-nourished. No distress.  HENT:  Head: Normocephalic and atraumatic.  Eyes: Pupils are equal, round, and reactive to light. Conjunctivae and EOM are normal. No scleral icterus.  Neck: Neck supple.  Cardiovascular:  Normal rate, regular rhythm and normal heart sounds.  Pulmonary/Chest: Effort normal and breath sounds normal. He has no rales.  Lymphadenopathy:    He has no cervical adenopathy.  Neurological: He is alert.  Skin: Skin is warm and dry. There is erythema.  3 by 5 cm oval area of erythema and induration on R mid abdomen  Some peeling skin/ no drainage and not fluctuant Warm  No streaks  Mildly tender  Psychiatric: He has a normal mood and affect.          Assessment & Plan:   Problem List Items Addressed This Visit      Other   Cellulitis - Primary    Small area of skin infection on R abdomen  Not fluctuant/no definite abscess but area is oval in color  Px keflex 500 mg tid for 10 d  Pt is diabetic so will monitor closely  Adv to clean well with soap and water  Watch for drainage and alert us  inst to call if inc in redness/pain/size/swelling or if it becomes fluctuant /needs draining   Meds ordered this encounter  Medications  . cephALEXin (KEFLEX) 250 MG capsule    Sig: Take 1 capsule (250 mg total) by mouth 3 (three) times daily.    Dispense:  30 capsule    Refill:  0   Counseled on not re using needles-he has a good supply now          

## 2018-02-21 NOTE — Assessment & Plan Note (Signed)
Small area of skin infection on R abdomen  Not fluctuant/no definite abscess but area is oval in color  Px keflex 500 mg tid for 10 d  Pt is diabetic so will monitor closely  Adv to clean well with soap and water  Watch for drainage and alert [place]  inst to call if inc in redness/pain/size/swelling or if it becomes fluctuant /needs draining   Meds ordered this encounter  Medications  . cephALEXin (KEFLEX) 250 MG capsule    Sig: Take 1 capsule (250 mg total) by mouth 3 (three) times daily.    Dispense:  30 capsule    Refill:  0   Counseled on not re using needles-he has a good supply now

## 2018-05-14 ENCOUNTER — Ambulatory Visit: Payer: Medicare HMO | Admitting: Primary Care

## 2018-05-17 ENCOUNTER — Telehealth: Payer: Self-pay | Admitting: Primary Care

## 2018-05-17 ENCOUNTER — Encounter: Payer: Self-pay | Admitting: Primary Care

## 2018-05-17 ENCOUNTER — Ambulatory Visit: Payer: Medicare HMO | Admitting: Primary Care

## 2018-05-17 ENCOUNTER — Ambulatory Visit (INDEPENDENT_AMBULATORY_CARE_PROVIDER_SITE_OTHER): Payer: Medicare HMO | Admitting: Primary Care

## 2018-05-17 VITALS — BP 144/90 | HR 73 | Temp 97.8°F | Ht 71.5 in | Wt 205.5 lb

## 2018-05-17 DIAGNOSIS — E1165 Type 2 diabetes mellitus with hyperglycemia: Secondary | ICD-10-CM

## 2018-05-17 DIAGNOSIS — E119 Type 2 diabetes mellitus without complications: Secondary | ICD-10-CM | POA: Diagnosis not present

## 2018-05-17 DIAGNOSIS — I10 Essential (primary) hypertension: Secondary | ICD-10-CM

## 2018-05-17 DIAGNOSIS — Z794 Long term (current) use of insulin: Secondary | ICD-10-CM | POA: Diagnosis not present

## 2018-05-17 LAB — POCT GLYCOSYLATED HEMOGLOBIN (HGB A1C): Hemoglobin A1C: 6.4 % — AB (ref 4.0–5.6)

## 2018-05-17 NOTE — Assessment & Plan Note (Signed)
BP has been high during the last two visits. Will call patient and ask for home readings. If above goal then will stop HCTZ, increase lisinopril to 30 mg and add in Amlodipine 10 mg.

## 2018-05-17 NOTE — Assessment & Plan Note (Signed)
Repeat A1C today of 6.4 which is at goal.  Continue Relion 70/30, 20 units BID. Continue statin and ACE. Foot exam today. Discussed to schedule an eye exam. Pneumonia vaccination UTD.  Follow up in 6 months.

## 2018-05-17 NOTE — Telephone Encounter (Signed)
Message left for patient to return my call.  

## 2018-05-17 NOTE — Progress Notes (Signed)
Subjective:    Patient ID: Jonathan Page, male    DOB: 05-10-1951, 67 y.o.   MRN: [id number]  HPI  [name] is a 67 year old male who presents today for follow up of diabetes.  Current medications include: Relion 70/30 20 units BID.  He is checking his/ blood glucose once daily and is getting readings of: Fasting AM: 120's-130's  Highest reading: 130's Lowest reading: 90  Last A1C: 6.7 in May 2019, 6.4 today Last Eye Exam: No recent exam, he will schedule an appointment.  Last Foot Exam: Due Pneumonia Vaccination: Completed in 2015 ACE/ARB: Lisinopril Statin: Atorvastatin   Diet currently consists of:  Breakfast: Boiled egg, oatmeal  Lunch: Skips Dinner: Salads, oatmeal, some meat Snacks: Oats, peanut butter crackres, fruit cups, yogurt, applesauce  Desserts: None Beverages: Water  Exercise: He is walking 6 days weekly for 1 hour at a time on the treadmill.    Review of Systems  Respiratory: Negative for shortness of breath.   Cardiovascular: Negative for chest pain.  Neurological: Negative for dizziness, numbness and headaches.       Past Medical History:  Diagnosis Date  . Concussion syndrome   . Diabetes ([place])   . Frequent headaches   . Hypertension      Social History   Socioeconomic History  . Marital status: Divorced    Spouse name: Not on file  . Number of children: Not on file  . Years of education: Not on file  . Highest education level: Not on file  Occupational History  . Not on file  Social Needs  . Financial resource strain: Not on file  . Food insecurity:    Worry: Not on file    Inability: Not on file  . Transportation needs:    Medical: Not on file    Non-medical: Not on file  Tobacco Use  . Smoking status: Former Smoker    Types: Cigarettes  . Smokeless tobacco: Never Used  Substance and Sexual Activity  . Alcohol use: Yes    Alcohol/week: 12.0 - 18.0 standard drinks    Types: 12 - 18 Cans of beer per week  . Drug use:  No  . Sexual activity: Not on file  Lifestyle  . Physical activity:    Days per week: Not on file    Minutes per session: Not on file  . Stress: Not on file  Relationships  . Social connections:    Talks on phone: Not on file    Gets together: Not on file    Attends religious service: Not on file    Active member of club or organization: Not on file    Attends meetings of clubs or organizations: Not on file    Relationship status: Not on file  . Intimate partner violence:    Fear of current or ex partner: Not on file    Emotionally abused: Not on file    Physically abused: Not on file    Forced sexual activity: Not on file  Other Topics Concern  . Not on file  Social History Narrative   Divorced.   2 children, 1 grandchild.   Retired. Once worked as a Catering manager.    Enjoys relaxing.     Past Surgical History:  Procedure Laterality Date  . APPENDECTOMY  1960    Family History  Problem Relation Age of Onset  . Diabetes Father   . Diabetes Paternal Grandfather   . Diabetes Paternal Grandmother  No Known Allergies  Current Outpatient Medications on File Prior to Visit  Medication Sig Dispense Refill  . aspirin EC 81 MG tablet Take 81 mg by mouth daily.    Marland Kitchen atorvastatin (LIPITOR) 40 MG tablet Take 1 tablet (40 mg total) by mouth every evening. 90 tablet 3  . Blood Glucose Monitoring Suppl (ACCU-CHEK AVIVA PLUS) w/Device KIT Check blood sugar before breakfast, lunch and bedtime and as directed. Dx E11.65 1 kit 0  . glucose blood (ACCU-CHEK AVIVA PLUS) test strip Check blood sugar before breakfast, lunch and bedtime and as directed. Dx E11.65 300 each 1  . Insulin NPH Isophane & Regular (RELION 70/30 [place]) Inject 20 Units into the skin 2 (two) times daily.    . Lancets (ACCU-CHEK SOFT TOUCH) lancets Check blood sugar before breakfast, lunch and bedtime and as directed. Dx E11.65 300 each 1  . lisinopril-hydrochlorothiazide (PRINZIDE,ZESTORETIC) 20-25 MG tablet Take  1 tablet by mouth daily. 90 tablet 0   No current facility-administered medications on file prior to visit.     BP (!) 144/90   Pulse 73   Temp 97.8 F (36.6 C) (Oral)   Ht 5' 11.5" (1.816 m)   Wt 205 lb 8 oz (93.2 kg)   SpO2 98%   BMI 28.26 kg/m    Objective:   Physical Exam  Constitutional: He appears well-nourished.  Neck: Neck supple.  Cardiovascular: Normal rate and regular rhythm.  Respiratory: Effort normal and breath sounds normal.  Skin: Skin is warm and dry.           Assessment & Plan:

## 2018-05-17 NOTE — Patient Instructions (Addendum)
Continue injecting Relion 70/30 insulin 20 units twice daily.  Continue to check your blood sugars, try rotating times of checks: Before breakfast, lunch, dinner 2 hours after breakfast, lunch, dinner Bedtime   Continue exercising. You should be getting 150 minutes of moderate intensity exercise weekly.  Continue to work on [employer].  We will see you in 6 months as scheduled.  It was a pleasure to see you today!

## 2018-05-17 NOTE — Telephone Encounter (Signed)
Please call patient to check on home BP readings. His BP has been elevated on two prior visits and I accidentally forgot to ask him about home readings today. If he's not checking then he needs to check and call us back in 2 weeks.

## 2018-05-25 NOTE — Telephone Encounter (Signed)
Message left for patient to return my call on 05/21/2018 and 05/25/2018

## 2018-08-07 ENCOUNTER — Telehealth: Payer: Self-pay | Admitting: Primary Care

## 2018-08-07 NOTE — Telephone Encounter (Signed)
Left message asking pt to call office please r/s 6/1 appointment with [facility]

## 2018-10-25 ENCOUNTER — Ambulatory Visit (INDEPENDENT_AMBULATORY_CARE_PROVIDER_SITE_OTHER): Payer: Medicare HMO

## 2018-10-25 DIAGNOSIS — Z0000 Encounter for general adult medical examination without abnormal findings: Secondary | ICD-10-CM | POA: Diagnosis not present

## 2018-10-25 NOTE — Patient Instructions (Signed)
Jonathan Page , Thank you for taking time to come for your Medicare Wellness Visit. I appreciate your ongoing commitment to your health goals. Please review the following plan we discussed and let me know if I can assist you in the future.   These are the goals we discussed: Goals    . Patient Stated     Starting 10/25/2018, I will continue to take medications as prescribed.        This is a list of the screening recommended for you and due dates:  Health Maintenance  Topic Date Due  . Tetanus Vaccine  10/20/2026*  . Hemoglobin A1C  11/16/2018  . Eye exam for diabetics  11/22/2018  . Flu Shot  01/12/2019  . Pneumonia vaccines (2 of 2 - PPSV23) 03/14/2019  . Complete foot exam   05/18/2019  . Colon Cancer Screening  06/13/2020  .  Hepatitis C: One time screening is recommended by Center for Disease Control  (CDC) for  adults born from [birth year] through 1965.   Completed  *Topic was postponed. The date shown is not the original due date.   Preventive Care for Adults  A healthy lifestyle and preventive care can promote health and wellness. Preventive health guidelines for adults include the following key practices.  . A routine yearly physical is a good way to check with your health care provider about your health and preventive screening. It is a chance to share any concerns and updates on your health and to receive a thorough exam.  . Visit your dentist for a routine exam and preventive care every 6 months. Brush your teeth twice a day and floss once a day. Good oral hygiene prevents tooth decay and gum disease.  . The frequency of eye exams is based on your age, health, family medical history, use  of contact lenses, and other factors. Follow your health care provider's recommendations for frequency of eye exams.  . Eat a healthy diet. Foods like vegetables, fruits, whole grains, low-fat dairy products, and lean protein foods contain the nutrients you need without too many calories.  Decrease your intake of foods high in solid fats, added sugars, and salt. Eat the right amount of calories for you. Get information about a proper diet from your health care provider, if necessary.  . Regular physical exercise is one of the most important things you can do for your health. Most adults should get at least 150 minutes of moderate-intensity exercise (any activity that increases your heart rate and causes you to sweat) each week. In addition, most adults need muscle-strengthening exercises on 2 or more days a week.  Silver Sneakers may be a benefit available to you. To determine eligibility, you may visit the website: www.silversneakers.com or contact program at 726-571-5279 Mon-Fri between 8AM-8PM.   . Maintain a healthy weight. The body mass index (BMI) is a screening tool to identify possible weight problems. It provides an estimate of body fat based on height and weight. Your health care provider can find your BMI and can help you achieve or maintain a healthy weight.   For adults 20 years and older: ? A BMI below 18.5 is considered underweight. ? A BMI of 18.5 to 24.9 is normal. ? A BMI of 25 to 29.9 is considered overweight. ? A BMI of 30 and above is considered obese.   . Maintain normal blood lipids and cholesterol levels by exercising and minimizing your intake of saturated fat. Eat a balanced diet with plenty of  fruit and vegetables. Blood tests for lipids and cholesterol should begin at age 37 and be repeated every 5 years. If your lipid or cholesterol levels are high, you are over 50, or you are at high risk for heart disease, you may need your cholesterol levels checked more frequently. Ongoing high lipid and cholesterol levels should be treated with medicines if diet and exercise are not working.  . If you smoke, find out from your health care provider how to quit. If you do not use tobacco, please do not start.  . If you choose to drink alcohol, please do not consume  more than 2 drinks per day. One drink is considered to be 12 ounces (355 mL) of beer, 5 ounces (148 mL) of wine, or 1.5 ounces (44 mL) of liquor.  . If you are 53-61 years old, ask your health care provider if you should take aspirin to prevent strokes.  . Use sunscreen. Apply sunscreen liberally and repeatedly throughout the day. You should seek shade when your shadow is shorter than you. Protect yourself by wearing long sleeves, pants, a wide-brimmed hat, and sunglasses year round, whenever you are outdoors.  . Once a month, do a whole body skin exam, using a mirror to look at the skin on your back. Tell your health care provider of new moles, moles that have irregular borders, moles that are larger than a pencil eraser, or moles that have changed in shape or color.

## 2018-10-25 NOTE — Progress Notes (Signed)
Subjective:   Jonathan Page is a 68 y.o. male who presents for Medicare Annual/Subsequent preventive examination.  Review of Systems:  N/A Cardiac Risk Factors include: advanced age (>58mn, >>28women);male gender;diabetes mellitus;dyslipidemia     Objective:    Vitals: There were no vitals taken for this visit.  There is no height or weight on file to calculate BMI.  Advanced Directives 10/25/2018 10/20/2017 10/19/2016 10/14/2016 10/14/2016 07/06/2016 11/03/2015  Does Patient Have a Medical Advance Directive? Yes Yes Yes No No No No  Type of AParamed[place]of [place] will [place] will [place] will - - - -  Copy of H[place] Chart? No - copy requested No - copy requested No - copy requested - - - -  Would patient like information on creating a medical advance directive? - - - No - Patient declined - No - Patient declined No - patient declined information    Tobacco Social History   Tobacco Use  Smoking Status Former Smoker  . Types: Cigarettes  Smokeless Tobacco Never Used     Counseling given: No   Clinical Intake:  Pre-visit preparation completed: Yes  Pain : No/denies pain Pain Score: 0-No pain     Nutritional Status: BMI > 30  Obese Nutritional Risks: None Diabetes: Yes CBG done?: No Did pt. bring in CBG monitor from home?: No  How often do you need to have someone help you when you read instructions, pamphlets, or other written materials from your doctor or pharmacy?: 1 - Never What is the last grade level you completed in school?: Bachelor degree  Interpreter Needed?: No  Comments: pt lives independently Information entered by :: [name], LPN  Past Medical History:  Diagnosis Date  . Concussion syndrome   . [place]   . Frequent headaches   . Hypertension    Past Surgical History:  Procedure Laterality Date  . APPENDECTOMY  1960   Family  History  Problem Relation Age of Onset  . Diabetes Father   . Diabetes Paternal Grandfather   . Diabetes Paternal Grandmother    Social History   Socioeconomic History  . Marital status: Divorced    Spouse name: Not on file  . Number of children: Not on file  . Years of education: Not on file  . Highest education level: Not on file  Occupational History  . Not on file  Social Needs  . Financial resource strain: Not on file  . Food insecurity:    Worry: Not on file    Inability: Not on file  . Transportation needs:    Medical: Not on file    Non-medical: Not on file  Tobacco Use  . Smoking status: Former Smoker    Types: Cigarettes  . Smokeless tobacco: Never Used  Substance and Sexual Activity  . Alcohol use: Yes    Alcohol/week: 0.0 standard drinks    Comment: beer occassionally  . Drug use: No  . Sexual activity: Not on file  Lifestyle  . Physical activity:    Days per week: Not on file    Minutes per session: Not on file  . Stress: Not on file  Relationships  . Social connections:    Talks on phone: Not on file    Gets together: Not on file    Attends religious service: Not on file    Active member of club or organization: Not on file    Attends  meetings of clubs or organizations: Not on file    Relationship status: Not on file  Other Topics Concern  . Not on file  Social History Narrative   Divorced.   2 children, 1 grandchild.   Retired. Once worked as a Catering manager.    Enjoys relaxing.     Outpatient Encounter Medications as of 10/25/2018  Medication Sig  . aspirin EC 81 MG tablet Take 81 mg by mouth daily.  . Blood Glucose Monitoring Suppl (ACCU-CHEK AVIVA PLUS) w/Device KIT Check blood sugar before breakfast, lunch and bedtime and as directed. Dx E11.65  . glucose blood (ACCU-CHEK AVIVA PLUS) test strip Check blood sugar before breakfast, lunch and bedtime and as directed. Dx E11.65  . Insulin NPH Isophane & Regular (RELION 70/30 [place]) Inject 20  Units into the skin 2 (two) times daily.  . Lancets (ACCU-CHEK SOFT TOUCH) lancets Check blood sugar before breakfast, lunch and bedtime and as directed. Dx E11.65  . lisinopril-hydrochlorothiazide (PRINZIDE,ZESTORETIC) 20-25 MG tablet Take 1 tablet by mouth daily.  Marland Kitchen atorvastatin (LIPITOR) 40 MG tablet Take 1 tablet (40 mg total) by mouth every evening. (Patient not taking: Reported on 10/25/2018)   No facility-administered encounter medications on file as of 10/25/2018.     Activities of Daily Living In your present state of health, do you have any difficulty performing the following activities: 10/25/2018  Hearing? N  Vision? N  Difficulty concentrating or making decisions? N  Walking or climbing stairs? N  Dressing or bathing? N  Doing errands, shopping? N  Preparing Food and eating ? N  Using the Toilet? N  In the past six months, have you accidently leaked urine? N  Do you have problems with loss of bowel control? N  Managing your Medications? N  Managing your Finances? N  Housekeeping or managing your Housekeeping? N  Some recent data might be hidden    Patient Care Team: [name], NP as PCP - General (Internal Medicine)   Assessment:   This is a routine wellness examination for [name].  Vision Screening Comments: Vision exam in 2019 with Dr. George Page  Exercise Activities and Dietary recommendations Exercise limited by: None identified  Goals    . Patient Stated     Starting 10/25/2018, I will continue to take medications as prescribed.        Fall Risk Fall Risk  10/25/2018 10/20/2017 10/19/2016  Falls in the past year? 0 No Yes  Number falls in past yr: - - 1  Injury with Fall? - - Yes  Risk for fall due to : - - Other (Comment)  Risk for fall due to: Comment - - diabetic   Depression Screen PHQ 2/9 Scores 10/25/2018 10/20/2017 10/19/2016  PHQ - 2 Score 0 0 0  PHQ- 9 Score 0 0 -    Cognitive Function MMSE - Mini Mental State Exam 10/25/2018 10/20/2017  10/19/2016  Orientation to time '5 5 5  ' Orientation to Place '5 5 5  ' Registration '3 3 3  ' Attention/ Calculation 0 0 0  Recall '3 2 3  ' Language- name 2 objects 0 0 0  Language- repeat '1 1 1  ' Language- follow 3 step command 0 3 3  Language- read & follow direction 0 0 0  Write a sentence 0 0 0  Copy design 0 0 0  Total score '17 19 20     ' PLEASE NOTE: A Mini-Cog screen was completed. Maximum score is 17. A value of 0 denotes this  part of [place] MMSE was not completed or the patient failed this part of the Mini-Cog screening.   Mini-Cog Screening Orientation to Time - Max 5 pts Orientation to Place - Max 5 pts Registration - Max 3 pts Recall - Max 3 pts Language Repeat - Max 1 pts      Immunization History  Administered Date(s) Administered  . Pneumococcal Conjugate-13 06/20/2016  . Pneumococcal Polysaccharide-23 03/13/2014    Screening Tests Health Maintenance  Topic Date Due  . TETANUS/TDAP  10/20/2026 (Originally 05/07/1970)  . HEMOGLOBIN A1C  11/16/2018  . OPHTHALMOLOGY EXAM  11/22/2018  . INFLUENZA VACCINE  01/12/2019  . PNA vac Low Risk Adult (2 of 2 - PPSV23) 03/14/2019  . FOOT EXAM  05/18/2019  . COLONOSCOPY  06/13/2020  . Hepatitis C Screening  Completed       Plan:     I have personally reviewed, addressed, and noted the following in the patient's chart:  A. Medical and social history B. Use of alcohol, tobacco or illicit drugs  C. Current medications and supplements D. Functional ability and status E.  Nutritional status F.  Physical activity G. Advance directives H. List of other physicians I.  Hospitalizations, surgeries, and ER visits in previous 12 months J.  Vitals (unless it is a telemedicine encounter) K. Screenings to include cognitive, depression, hearing, vision (NOTE: hearing and vision screenings not completed in telemedicine encounter) L. Referrals and appointments   In addition, I have reviewed and discussed with patient certain  preventive protocols, quality metrics, and best practice recommendations. A written personalized care plan for preventive services and recommendations were provided to patient.  With patient's permission, we connected on 10/25/18 at 12:00 PM EDT by a video enabled telemedicine application. Two patient identifiers were used to ensure the encounter occurred with the correct person.    Patient was in home and writer was in office.   Signed,   Lindell Noe, MHA, BS, LPN Health Coach

## 2018-10-25 NOTE — Progress Notes (Signed)
PCP notes:   Health maintenance:  No gaps identified  Abnormal screenings:   None   Patient concerns:   None  Nurse concerns:  None  Next PCP appt:   10/26/18 @ 1400  I reviewed health advisor's note, was available for consultation, and agree with documentation and plan. Loura Pardon MD

## 2018-10-26 ENCOUNTER — Encounter: Payer: Self-pay | Admitting: Primary Care

## 2018-10-26 ENCOUNTER — Ambulatory Visit (INDEPENDENT_AMBULATORY_CARE_PROVIDER_SITE_OTHER): Payer: Medicare HMO | Admitting: Primary Care

## 2018-10-26 ENCOUNTER — Ambulatory Visit: Payer: Medicare HMO

## 2018-10-26 DIAGNOSIS — E119 Type 2 diabetes mellitus without complications: Secondary | ICD-10-CM

## 2018-10-26 DIAGNOSIS — Z794 Long term (current) use of insulin: Secondary | ICD-10-CM | POA: Diagnosis not present

## 2018-10-26 NOTE — Patient Instructions (Signed)
We've reduced the dose of your insulin to 15 units twice daily.   Please notify me if you continue to notice drops in your sugars or sugars over 200.   We will see you in June as scheduled. It was a pleasure to see you today! Allie Bossier, NP-C

## 2018-10-26 NOTE — Assessment & Plan Note (Signed)
Periods of hypoglycemia after exercise/activity. Suspect this will continue to occur given the Spring/Summer upcoming seasons.   Reduce Relion 70/30 to 15 units BID. He will update if he continues to notice drops in glucose.  We will see him in late June for re-evaluation.

## 2018-10-26 NOTE — Progress Notes (Signed)
Subjective:    Patient ID: Jonathan Page, male    DOB: [birth year], 68 y.o.   MRN: [id number]  HPI  Virtual Visit via Video Note  I connected with Jonathan Page on 10/26/18 at  2:00 PM EDT by a video enabled telemedicine application and verified that I am speaking with the correct person using two identifiers.  Location: Patient: Home Provider: Office   I discussed the limitations of evaluation and management by telemedicine and the availability of in person appointments. The patient expressed understanding and agreed to proceed.  History of Present Illness:  Jonathan Page is a 68 year old male with a history of diabetes who presents today with a chief complaint of hypoglycemia.  He is currently managed on Relion 70/30 insulin for diabetes and is injecting 20 units twice daily.  Monday this week he finished mowing his lawn and noticed a tingling in his tongue. He checked his glucose which was 58 so he drank some orange juice and his glucose came up and symptoms resolved. He had another episode like this earlier today around 11 am. He was out mowing a family member's lawn, finished, noticed the tingling. He didn't have his glucometer with him but he drank more orange juice and felt better.   He checks his glucose every morning fasting and 1 hour after breakfast. Fasting readings are mostly in the 120's and post prandial readings are 120-130's. He's not had these hypoglycemic episodes in the past.   Observations/Objective:  Alert and oriented. Appears well, not sickly. No distress. Speaking in complete sentences.   Assessment and Plan:  See problem based charting.   Follow Up Instructions:  We've reduced the dose of your insulin to 15 units twice daily.   Please notify me if you continue to notice drops in your sugars or sugars over 200.   We will see you in June as scheduled. It was a pleasure to see you today! Allie Bossier, NP-C    I discussed the assessment and  treatment plan with the patient. The patient was provided an opportunity to ask questions and all were answered. The patient agreed with the plan and demonstrated an understanding of the instructions.   The patient was advised to call back or seek an in-person evaluation if the symptoms worsen or if the condition fails to improve as anticipated.    [name], NP    Review of Systems  Eyes: Negative for visual disturbance.  Respiratory: Negative for shortness of breath.   Cardiovascular: Negative for chest pain.  Neurological: Negative for dizziness.       Past Medical History:  Diagnosis Date   Concussion syndrome    Diabetes ([place])    Frequent headaches    Hypertension      Social History   Socioeconomic History   Marital status: Divorced    Spouse name: Not on file   Number of children: Not on file   Years of education: Not on file   Highest education level: Not on file  Occupational History   Not on file  Social Needs   Financial resource strain: Not on file   Food insecurity:    Worry: Not on file    Inability: Not on file   Transportation needs:    Medical: Not on file    Non-medical: Not on file  Tobacco Use   Smoking status: Former Smoker    Types: Cigarettes   Smokeless tobacco: Never Used  Substance and Sexual  Activity   Alcohol use: Yes    Alcohol/week: 0.0 standard drinks    Comment: beer occassionally   Drug use: No   Sexual activity: Not on file  Lifestyle   Physical activity:    Days per week: Not on file    Minutes per session: Not on file   Stress: Not on file  Relationships   Social connections:    Talks on phone: Not on file    Gets together: Not on file    Attends religious service: Not on file    Active member of club or organization: Not on file    Attends meetings of clubs or organizations: Not on file    Relationship status: Not on file   Intimate partner violence:    Fear of current or ex partner:  Not on file    Emotionally abused: Not on file    Physically abused: Not on file    Forced sexual activity: Not on file  Other Topics Concern   Not on file  Social History Narrative   Divorced.   2 children, 1 grandchild.   Retired. Once worked as a Catering manager.    Enjoys relaxing.     Past Surgical History:  Procedure Laterality Date   APPENDECTOMY  1960    Family History  Problem Relation Age of Onset   Diabetes Father    Diabetes Paternal Grandfather    Diabetes Paternal Grandmother     No Known Allergies  Current Outpatient Medications on File Prior to Visit  Medication Sig Dispense Refill   aspirin EC 81 MG tablet Take 81 mg by mouth daily.     Blood Glucose Monitoring Suppl (ACCU-CHEK AVIVA PLUS) w/Device KIT Check blood sugar before breakfast, lunch and bedtime and as directed. Dx E11.65 1 kit 0   glucose blood (ACCU-CHEK AVIVA PLUS) test strip Check blood sugar before breakfast, lunch and bedtime and as directed. Dx E11.65 300 each 1   Insulin NPH Isophane & Regular (RELION 70/30 [place]) Inject 20 Units into the skin 2 (two) times daily.     Lancets (ACCU-CHEK SOFT TOUCH) lancets Check blood sugar before breakfast, lunch and bedtime and as directed. Dx E11.65 300 each 1   lisinopril-hydrochlorothiazide (PRINZIDE,ZESTORETIC) 20-25 MG tablet Take 1 tablet by mouth daily. 90 tablet 0   atorvastatin (LIPITOR) 40 MG tablet Take 1 tablet (40 mg total) by mouth every evening. (Patient not taking: Reported on 10/25/2018) 90 tablet 3   No current facility-administered medications on file prior to visit.     There were no vitals taken for this visit.   Objective:   Physical Exam  Constitutional: He is oriented to person, place, and time. He appears well-nourished.  Respiratory: Effort normal.  Neurological: He is alert and oriented to person, place, and time.  Psychiatric: He has a normal mood and affect.           Assessment & Plan:

## 2018-10-31 ENCOUNTER — Encounter: Payer: Medicare HMO | Admitting: Primary Care

## 2018-11-12 ENCOUNTER — Encounter: Payer: Medicare HMO | Admitting: Primary Care

## 2018-11-23 ENCOUNTER — Other Ambulatory Visit: Payer: Self-pay | Admitting: Primary Care

## 2018-11-23 DIAGNOSIS — E785 Hyperlipidemia, unspecified: Secondary | ICD-10-CM

## 2018-11-23 DIAGNOSIS — E119 Type 2 diabetes mellitus without complications: Secondary | ICD-10-CM

## 2018-11-23 DIAGNOSIS — I10 Essential (primary) hypertension: Secondary | ICD-10-CM

## 2018-11-27 ENCOUNTER — Telehealth: Payer: Self-pay

## 2018-11-27 NOTE — Telephone Encounter (Signed)
Left message to call clinic, needs COVID screen and back door lab info   

## 2018-11-28 ENCOUNTER — Other Ambulatory Visit: Payer: Self-pay

## 2018-11-28 ENCOUNTER — Other Ambulatory Visit (INDEPENDENT_AMBULATORY_CARE_PROVIDER_SITE_OTHER): Payer: Medicare HMO

## 2018-11-28 DIAGNOSIS — E785 Hyperlipidemia, unspecified: Secondary | ICD-10-CM

## 2018-11-28 DIAGNOSIS — Z794 Long term (current) use of insulin: Secondary | ICD-10-CM | POA: Diagnosis not present

## 2018-11-28 DIAGNOSIS — I10 Essential (primary) hypertension: Secondary | ICD-10-CM | POA: Diagnosis not present

## 2018-11-28 DIAGNOSIS — E119 Type 2 diabetes mellitus without complications: Secondary | ICD-10-CM

## 2018-11-28 LAB — COMPREHENSIVE METABOLIC PANEL
ALT: 24 U/L (ref 0–53)
AST: 23 U/L (ref 0–37)
Albumin: 4.4 g/dL (ref 3.5–5.2)
Alkaline Phosphatase: 65 U/L (ref 39–117)
BUN: 16 mg/dL (ref 6–23)
CO2: 31 mEq/L (ref 19–32)
Calcium: 9.6 mg/dL (ref 8.4–10.5)
Chloride: 106 mEq/L (ref 96–112)
Creatinine, Ser: 1.45 mg/dL (ref 0.40–1.50)
GFR: 58.64 mL/min — ABNORMAL LOW (ref >60.00)
Glucose, Bld: 69 mg/dL — ABNORMAL LOW (ref 70–99)
Potassium: 4 mEq/L (ref 3.5–5.1)
Sodium: 142 mEq/L (ref 135–145)
Total Bilirubin: 0.6 mg/dL (ref 0.2–1.2)
Total Protein: 6.6 g/dL (ref 6.0–8.3)

## 2018-11-28 LAB — LDL CHOLESTEROL, DIRECT: Direct LDL: 61 mg/dL

## 2018-11-28 LAB — LIPID PANEL
Cholesterol: 139 mg/dL (ref 0–200)
HDL: 45.8 mg/dL (ref >39.00)
NonHDL: 93.33
Total CHOL/HDL Ratio: 3
Triglycerides: 253 mg/dL — ABNORMAL HIGH (ref 0.0–149.0)
VLDL: 50.6 mg/dL — ABNORMAL HIGH (ref 0.0–40.0)

## 2018-11-28 LAB — HEMOGLOBIN A1C: Hgb A1c MFr Bld: 6.3 % (ref 4.6–6.5)

## 2018-12-05 ENCOUNTER — Other Ambulatory Visit: Payer: Self-pay

## 2018-12-05 ENCOUNTER — Encounter: Payer: Self-pay | Admitting: Internal Medicine

## 2018-12-05 ENCOUNTER — Encounter: Payer: Self-pay | Admitting: Primary Care

## 2018-12-05 ENCOUNTER — Ambulatory Visit (INDEPENDENT_AMBULATORY_CARE_PROVIDER_SITE_OTHER): Payer: Medicare HMO | Admitting: Primary Care

## 2018-12-05 VITALS — BP 142/90 | HR 77 | Temp 98.5°F | Ht 71.5 in | Wt 202.2 lb

## 2018-12-05 DIAGNOSIS — Z794 Long term (current) use of insulin: Secondary | ICD-10-CM

## 2018-12-05 DIAGNOSIS — I10 Essential (primary) hypertension: Secondary | ICD-10-CM | POA: Diagnosis not present

## 2018-12-05 DIAGNOSIS — E785 Hyperlipidemia, unspecified: Secondary | ICD-10-CM

## 2018-12-05 DIAGNOSIS — E119 Type 2 diabetes mellitus without complications: Secondary | ICD-10-CM

## 2018-12-05 DIAGNOSIS — Z0000 Encounter for general adult medical examination without abnormal findings: Secondary | ICD-10-CM | POA: Diagnosis not present

## 2018-12-05 DIAGNOSIS — Z23 Encounter for immunization: Secondary | ICD-10-CM | POA: Diagnosis not present

## 2018-12-05 DIAGNOSIS — Z1211 Encounter for screening for malignant neoplasm of colon: Secondary | ICD-10-CM

## 2018-12-05 DIAGNOSIS — N183 Chronic kidney disease, stage 3 unspecified: Secondary | ICD-10-CM

## 2018-12-05 MED ORDER — LISINOPRIL 20 MG PO TABS: 20.0000 mg | ORAL_TABLET | Freq: Every day | ORAL | 3 refills | Status: DC

## 2018-12-05 MED ORDER — ZOSTER VAC RECOMB ADJUVANTED 50 MCG/0.5ML IM SUSR: 0.5000 mL | Freq: Once | INTRAMUSCULAR | 1 refills | Status: AC

## 2018-12-05 NOTE — Assessment & Plan Note (Signed)
Has been off of lisinopril-HCTZ for over one year, BP uncontrolled. Rx for lisinopril 20 mg sent to pharmacy. Follow up in 2-3 weeks for BP check and BMP.

## 2018-12-05 NOTE — Assessment & Plan Note (Signed)
Well controlled on recent labs with A1C of 6.3. Discussed to notify me if he sees glucose readings below 80 on a consistent basis, this has only occurred once.  Managed on statin. Re-initiated ACE. Pneumonia vaccination UTD. He will schedule eye exam. Foot exam in December.  Continue 70/30 insulin 15 units BID.

## 2018-12-05 NOTE — Patient Instructions (Signed)
Start lisinopril 20 mg tablets once daily for high blood pressure. I sent this to your pharmacy.  Please notify me if you continue to see blood sugar readings below 80.  Continue exercising. You should be getting 150 minutes of moderate intensity exercise weekly.  Continue to work on a healthy diet.   Ensure you are consuming 64 ounces of water daily.  You will be contacted regarding your referral to GI for the colonoscopy.  Please let us know if you have not been contacted within one week.   Take the Shingles vaccination to your pharmacy.  Schedule a follow up visit for 2-3 weeks for blood pressure check.  It was a pleasure to see you today!   Preventive Care 68 Years and Older, Male Preventive care refers to lifestyle choices and visits with your health care provider that can promote health and wellness. What does preventive care include?   A yearly physical exam. This is also called an annual well check.  Dental exams once or twice a year.  Routine eye exams. Ask your health care provider how often you should have your eyes checked.  Personal lifestyle choices, including: ? Daily care of your teeth and gums. ? Regular physical activity. ? Eating a healthy diet. ? Avoiding tobacco and drug use. ? Limiting alcohol use. ? Practicing safe sex. ? Taking low doses of aspirin every day. ? Taking vitamin and mineral supplements as recommended by your health care provider. What happens during an annual well check? The services and screenings done by your health care provider during your annual well check will depend on your age, overall health, lifestyle risk factors, and family history of disease. Counseling Your health care provider may ask you questions about your:  Alcohol use.  Tobacco use.  Drug use.  Emotional well-being.  Home and relationship well-being.  Sexual activity.  Eating habits.  History of falls.  Memory and ability to understand (cognition).   Work and work Statistician. Screening You may have the following tests or measurements:  Height, weight, and BMI.  Blood pressure.  Lipid and cholesterol levels. These may be checked every 5 years, or more frequently if you are over 68 years old.  Skin check.  Lung cancer screening. You may have this screening every year starting at age 68 if you have a 30-pack-year history of smoking and currently smoke or have quit within the past 15 years.  Colorectal cancer screening. All adults should have this screening starting at age 2 and continuing until age 26. You will have tests every 1-10 years, depending on your results and the type of screening test. People at increased risk should start screening at an earlier age. Screening tests may include: ? Guaiac-based fecal occult blood testing. ? Fecal immunochemical test (FIT). ? Stool DNA test. ? Virtual colonoscopy. ? Sigmoidoscopy. During this test, a flexible tube with a tiny camera (sigmoidoscope) is used to examine your rectum and lower colon. The sigmoidoscope is inserted through your anus into your rectum and lower colon. ? Colonoscopy. During this test, a long, thin, flexible tube with a tiny camera (colonoscope) is used to examine your entire colon and rectum.  Prostate cancer screening. Recommendations will vary depending on your family history and other risks.  Hepatitis C blood test.  Hepatitis B blood test.  Sexually transmitted disease (STD) testing.  Diabetes screening. This is done by checking your blood sugar (glucose) after you have not eaten for a while (fasting). You may have this done  every 1-3 years.  Abdominal aortic aneurysm (AAA) screening. You may need this if you are a current or former smoker.  Osteoporosis. You may be screened starting at age 58 if you are at high risk. Talk with your health care provider about your test results, treatment options, and if necessary, the need for more tests. Vaccines Your  health care provider may recommend certain vaccines, such as:  Influenza vaccine. This is recommended every year.  Tetanus, diphtheria, and acellular pertussis (Tdap, Td) vaccine. You may need a Td booster every 10 years.  Varicella vaccine. You may need this if you have not been vaccinated.  Zoster vaccine. You may need this after age 68.  Measles, mumps, and rubella (MMR) vaccine. You may need at least one dose of MMR if you were born in 1957 or later. You may also need a second dose.  Pneumococcal 13-valent conjugate (PCV13) vaccine. One dose is recommended after age 30.  Pneumococcal polysaccharide (PPSV23) vaccine. One dose is recommended after age 29.  Meningococcal vaccine. You may need this if you have certain conditions.  Hepatitis A vaccine. You may need this if you have certain conditions or if you travel or work in places where you may be exposed to hepatitis A.  Hepatitis B vaccine. You may need this if you have certain conditions or if you travel or work in places where you may be exposed to hepatitis B.  Haemophilus influenzae type b (Hib) vaccine. You may need this if you have certain risk factors. Talk to your health care provider about which screenings and vaccines you need and how often you need them. This information is not intended to replace advice given to you by your health care provider. Make sure you discuss any questions you have with your health care provider. Document Released: 06/26/2015 Document Revised: 07/20/2017 Document Reviewed: 03/31/2015 Elsevier Interactive Patient Education  2019 [employer].

## 2018-12-05 NOTE — Progress Notes (Signed)
Subjective:    Patient ID: Jonathan Page, male    DOB: 02/08/1951, 68 y.o.   MRN: [id number]  HPI  Jonathan Page is a 68 year old male who presents today for complete physical.  Immunizations: -Influenza: Due this season  -Pneumonia: Completed last in 2018 -Shingles: Never completed  Diet: He endorses a healthy diet. Eating fruit, pasta, cereal, seafood. He is snacking on peanut butter crackers. No sweets. He is drinking mostly water. Exercise: He is walking 7 days weekly.  Eye exam: Due for exam now, he will schedule Dental exam: No recent exam Colonoscopy: Overdue PSA: 1.56 in May 2019 Hep C Screen: Negative  BP Readings from Last 3 Encounters:  12/05/18 (!) 142/90  05/17/18 (!) 144/90  02/21/18 (!) 148/90   He has been checking his BP at home and doesn't remember his readings. He has not had his lisinopril-HCTZ in over one year.   He is checking his glucose readings twice daily and is getting readings of:  AM fasting: 115-120's Bedtime: 115-120  Lowest reading: 65 Highest reading: 140  Review of Systems  Constitutional: Negative for unexpected weight change.  HENT: Negative for rhinorrhea.   Respiratory: Negative for cough and shortness of breath.   Cardiovascular: Negative for chest pain.  Gastrointestinal: Negative for constipation and diarrhea.  Genitourinary: Negative for difficulty urinating.  Musculoskeletal: Negative for arthralgias and myalgias.  Skin: Negative for rash.  Allergic/Immunologic: Negative for environmental allergies.  Neurological: Negative for dizziness, numbness and headaches.  Psychiatric/Behavioral: The patient is not nervous/anxious.        Past Medical History:  Diagnosis Date  . Concussion syndrome   . Diabetes ([place])   . Frequent headaches   . Hypertension      Social History   Socioeconomic History  . Marital status: Divorced    Spouse name: Not on file  . Number of children: Not on file  . Years of education: Not on  file  . Highest education level: Not on file  Occupational History  . Not on file  Social Needs  . Financial resource strain: Not on file  . Food insecurity    Worry: Not on file    Inability: Not on file  . Transportation needs    Medical: Not on file    Non-medical: Not on file  Tobacco Use  . Smoking status: Former Smoker    Types: Cigarettes  . Smokeless tobacco: Never Used  Substance and Sexual Activity  . Alcohol use: Yes    Alcohol/week: 0.0 standard drinks    Comment: beer occassionally  . Drug use: No  . Sexual activity: Not on file  Lifestyle  . Physical activity    Days per week: Not on file    Minutes per session: Not on file  . Stress: Not on file  Relationships  . Social Herbalist on phone: Not on file    Gets together: Not on file    Attends religious service: Not on file    Active member of club or organization: Not on file    Attends meetings of clubs or organizations: Not on file    Relationship status: Not on file  . Intimate partner violence    Fear of current or ex partner: Not on file    Emotionally abused: Not on file    Physically abused: Not on file    Forced sexual activity: Not on file  Other Topics Concern  . Not on file  Social History Narrative   Divorced.   2 children, 1 grandchild.   Retired. Once worked as a Catering manager.    Enjoys relaxing.     Past Surgical History:  Procedure Laterality Date  . APPENDECTOMY  1960    Family History  Problem Relation Age of Onset  . Diabetes Father   . Diabetes Paternal Grandfather   . Diabetes Paternal Grandmother     No Known Allergies  Current Outpatient Medications on File Prior to Visit  Medication Sig Dispense Refill  . aspirin EC 81 MG tablet Take 81 mg by mouth daily.    . Blood Glucose Monitoring Suppl (ACCU-CHEK AVIVA PLUS) w/Device KIT Check blood sugar before breakfast, lunch and bedtime and as directed. Dx E11.65 1 kit 0  . glucose blood (ACCU-CHEK AVIVA  PLUS) test strip Check blood sugar before breakfast, lunch and bedtime and as directed. Dx E11.65 300 each 1  . Insulin NPH Isophane & Regular (RELION 70/30 [place]) Inject 15 Units into the skin 2 (two) times a day.    . Lancets (ACCU-CHEK SOFT TOUCH) lancets Check blood sugar before breakfast, lunch and bedtime and as directed. Dx E11.65 300 each 1  . atorvastatin (LIPITOR) 40 MG tablet Take 1 tablet (40 mg total) by mouth every evening. (Patient not taking: Reported on 12/05/2018) 90 tablet 3   No current facility-administered medications on file prior to visit.     BP (!) 142/90   Pulse 77   Temp 98.5 F (36.9 C) (Tympanic)   Ht 5' 11.5" (1.816 m)   Wt 202 lb 4 oz (91.7 kg)   SpO2 97%   BMI 27.82 kg/m    Objective:   Physical Exam  Constitutional: He is oriented to person, place, and time. He appears well-nourished.  HENT:  Mouth/Throat: No oropharyngeal exudate.  Eyes: Pupils are equal, round, and reactive to light. EOM are normal.  Neck: Neck supple. No thyromegaly present.  Cardiovascular: Normal rate and regular rhythm.  Respiratory: Effort normal and breath sounds normal.  GI: Soft. Bowel sounds are normal. There is no abdominal tenderness.  Musculoskeletal: Normal range of motion.  Neurological: He is alert and oriented to person, place, and time.  Skin: Skin is warm and dry.  Psychiatric: He has a normal mood and affect.           Assessment & Plan:

## 2018-12-05 NOTE — Assessment & Plan Note (Signed)
LDL under good control with recent labs. Continue statin therapy.

## 2018-12-05 NOTE — Assessment & Plan Note (Signed)
Immunizations UTD, Rx for Shingrix provided. PSA UTD. Colon cancer screening overdue, referral placed. Encouraged a healthy diet, continued exercise. Exam unremarkable. Labs reviewed. Follow up in 1 year for CPE.

## 2018-12-05 NOTE — Assessment & Plan Note (Signed)
Renal function overall stable. Will re-initiate ACE for protection. Repeat BMP in 2-3 weeks.

## 2018-12-27 ENCOUNTER — Other Ambulatory Visit: Payer: Self-pay

## 2018-12-27 ENCOUNTER — Ambulatory Visit: Payer: Medicare HMO | Admitting: *Deleted

## 2018-12-27 VITALS — Ht 73.0 in | Wt 202.0 lb

## 2018-12-27 DIAGNOSIS — Z1211 Encounter for screening for malignant neoplasm of colon: Secondary | ICD-10-CM

## 2018-12-27 MED ORDER — GOLYTELY 236 G PO SOLR: 4000.0000 mL | Freq: Once | ORAL | 0 refills | Status: AC

## 2018-12-27 NOTE — Progress Notes (Signed)
Patient denies any allergies to egg or soy products. Patient denies complications with anesthesia/sedation.  Patient denies oxygen use at home and denies diet medications.   Pt verified name, DOB, address and insurance during PV today. Pt mailed instruction packet to included paper to complete and mail back to [facility] with addressed and stamped envelope, Emmi video, copy of consent form to read and not return, and instructions.PV completed over the phone. Pt encouraged to call with questions or concerns.

## 2019-01-03 ENCOUNTER — Other Ambulatory Visit (INDEPENDENT_AMBULATORY_CARE_PROVIDER_SITE_OTHER): Payer: Medicare HMO

## 2019-01-03 ENCOUNTER — Ambulatory Visit (INDEPENDENT_AMBULATORY_CARE_PROVIDER_SITE_OTHER): Payer: Medicare HMO | Admitting: *Deleted

## 2019-01-03 ENCOUNTER — Other Ambulatory Visit: Payer: Self-pay | Admitting: Primary Care

## 2019-01-03 VITALS — BP 142/86 | HR 85 | Temp 97.6°F

## 2019-01-03 DIAGNOSIS — I10 Essential (primary) hypertension: Secondary | ICD-10-CM

## 2019-01-03 NOTE — Progress Notes (Signed)
Per Allie Bossier, NP encounter order on 01/03/19, patient presents today for a nurse visit blood pressure check for ongoing follow up and management.  Vital Sign Readings today BP: 142/86, P:85, O2: 97, Temp: 97.6.  Patient is to keep taking meds as prescribed until she hears back from Allie Bossier, NP office

## 2019-01-04 LAB — BASIC METABOLIC PANEL
BUN: 18 mg/dL (ref 6–23)
CO2: 28 mEq/L (ref 19–32)
Calcium: 9.5 mg/dL (ref 8.4–10.5)
Chloride: 101 mEq/L (ref 96–112)
Creatinine, Ser: 1.68 mg/dL — ABNORMAL HIGH (ref 0.40–1.50)
GFR: 49.46 mL/min — ABNORMAL LOW (ref >60.00)
Glucose, Bld: 155 mg/dL — ABNORMAL HIGH (ref 70–99)
Potassium: 3.7 mEq/L (ref 3.5–5.1)
Sodium: 137 mEq/L (ref 135–145)

## 2019-01-08 ENCOUNTER — Other Ambulatory Visit: Payer: Self-pay | Admitting: Primary Care

## 2019-01-08 DIAGNOSIS — N183 Chronic kidney disease, stage 3 unspecified: Secondary | ICD-10-CM

## 2019-01-08 NOTE — Progress Notes (Signed)
Noted  

## 2019-01-09 ENCOUNTER — Telehealth: Payer: Self-pay | Admitting: Internal Medicine

## 2019-01-09 NOTE — Telephone Encounter (Signed)

## 2019-01-10 ENCOUNTER — Ambulatory Visit (AMBULATORY_SURGERY_CENTER): Payer: Medicare HMO | Admitting: Internal Medicine

## 2019-01-10 ENCOUNTER — Other Ambulatory Visit: Payer: Self-pay

## 2019-01-10 ENCOUNTER — Encounter: Payer: Self-pay | Admitting: Internal Medicine

## 2019-01-10 VITALS — BP 147/77 | HR 73 | Temp 98.3°F | Resp 16 | Ht 73.0 in | Wt 202.0 lb

## 2019-01-10 DIAGNOSIS — D123 Benign neoplasm of transverse colon: Secondary | ICD-10-CM

## 2019-01-10 DIAGNOSIS — Z1211 Encounter for screening for malignant neoplasm of colon: Secondary | ICD-10-CM

## 2019-01-10 DIAGNOSIS — D124 Benign neoplasm of descending colon: Secondary | ICD-10-CM

## 2019-01-10 MED ORDER — SODIUM CHLORIDE 0.9 % IV SOLN: 500.0000 mL | Freq: Once | INTRAVENOUS | Status: DC

## 2019-01-10 NOTE — Addendum Note (Signed)
Addended by: Ernestine Conrad D on: 01/10/2019 04:45 PM   Modules accepted: Orders

## 2019-01-10 NOTE — Patient Instructions (Signed)
Read all of the handouts given to you by your recovery room nurse.  YOU HAD AN ENDOSCOPIC PROCEDURE TODAY AT THE [facility]:   Refer to the procedure report that was given to you for any specific questions about what was found during the examination.  If the procedure report does not answer your questions, please call your gastroenterologist to clarify.  If you requested that your care partner not be given the details of your procedure findings, then the procedure report has been included in a sealed envelope for you to review at your convenience later.  YOU SHOULD EXPECT: Some feelings of bloating in the abdomen. Passage of more gas than usual.  Walking can help get rid of the air that was put into your GI tract during the procedure and reduce the bloating. If you had a lower endoscopy (such as a colonoscopy or flexible sigmoidoscopy) you may notice spotting of blood in your stool or on the toilet paper. If you underwent a bowel prep for your procedure, you may not have a normal bowel movement for a few days.  Please Note:  You might notice some irritation and congestion in your nose or some drainage.  This is from the oxygen used during your procedure.  There is no need for concern and it should clear up in a day or so.  SYMPTOMS TO REPORT IMMEDIATELY:   Following lower endoscopy (colonoscopy or flexible sigmoidoscopy):  Excessive amounts of blood in the stool  Significant tenderness or worsening of abdominal pains  Swelling of the abdomen that is new, acute  Fever of 100F or higher   For urgent or emergent issues, a gastroenterologist can be reached at any hour by calling [phone].   DIET:  We do recommend a small meal at first, but then you may proceed to your regular diet.  Drink plenty of fluids but you should avoid alcoholic beverages for 24 hours.  ACTIVITY:  You should plan to take it easy for the rest of today and you should NOT DRIVE or use heavy machinery until  tomorrow (because of the sedation medicines used during the test).    FOLLOW UP: Our staff will call the number listed on your records 48-72 hours following your procedure to check on you and address any questions or concerns that you may have regarding the information given to you following your procedure. If we do not reach you, we will leave a message.  We will attempt to reach you two times.  During this call, we will ask if you have developed any symptoms of COVID 19. If you develop any symptoms (ie: fever, flu-like symptoms, shortness of breath, cough etc.) before then, please call [phone].  If you test positive for Covid 19 in the 2 weeks post procedure, please call and report this information to us.    If any biopsies were taken you will be contacted by phone or by letter within the next 1-3 weeks.  Please call us at [phone] if you have not heard about the biopsies in 3 weeks.    SIGNATURES/CONFIDENTIALITY: You and/or your care partner have signed paperwork which will be entered into your electronic medical record.  These signatures attest to the fact that that the information above on your After Visit Summary has been reviewed and is understood.  Full responsibility of the confidentiality of this discharge information lies with you and/or your care-partner. 

## 2019-01-10 NOTE — Progress Notes (Signed)
Pt's states no medical or surgical changes since previsit or office visit. 

## 2019-01-10 NOTE — Progress Notes (Signed)
Report given to PACU, vss 

## 2019-01-10 NOTE — Progress Notes (Signed)
Called to room to assist during endoscopic procedure.  Patient ID and intended procedure confirmed with present staff. Received instructions for my participation in the procedure from the performing physician.  

## 2019-01-10 NOTE — Op Note (Signed)
[place] Patient Name: Jadarian Mckay Procedure Date: 01/10/2019 10:47 AM MRN: [id number] Endoscopist: Jerene Bears , MD Age: 68 Referring MD:  Date of Birth: 1951/02/03 Gender: Male Account #: [id number] Procedure:                Colonoscopy Indications:              Screening for colorectal malignant neoplasm,                            previous colonoscopy > 10 years ago Medicines:                Monitored Anesthesia Care Procedure:                Pre-Anesthesia Assessment:                           - Prior to the procedure, a History and Physical                            was performed, and patient medications and                            allergies were reviewed. The patient's tolerance of                            previous anesthesia was also reviewed. The risks                            and benefits of the procedure and the sedation                            options and risks were discussed with the patient.                            All questions were answered, and informed consent                            was obtained. Prior Anticoagulants: The patient has                            taken no previous anticoagulant or antiplatelet                            agents. ASA Grade Assessment: II - A patient with                            mild systemic disease. After reviewing the risks                            and benefits, the patient was deemed in                            satisfactory condition to undergo the procedure.  After obtaining informed consent, the colonoscope                            was passed under direct vision. Throughout the                            procedure, the patient's blood pressure, pulse, and                            oxygen saturations were monitored continuously. The                            Colonoscope was introduced through the anus and                            advanced to the cecum, identified  by appendiceal                            orifice and ileocecal valve. The colonoscopy was                            performed without difficulty. The patient tolerated                            the procedure well. The quality of the bowel                            preparation was good. The ileocecal valve,                            appendiceal orifice, and rectum were photographed. Scope In: 10:58:03 AM Scope Out: 11:19:17 AM Scope Withdrawal Time: 0 hours 13 minutes 35 seconds  Total Procedure Duration: 0 hours 21 minutes 14 seconds  Findings:                 The digital rectal exam was normal.                           A 10 mm polyp was found in the transverse colon.                            The polyp was pedunculated. The polyp was removed                            with a cold snare. Resection and retrieval were                            complete.                           Two sessile polyps were found in the transverse                            colon. The polyps were 3 to 5 mm in size.  These                            polyps were removed with a cold snare. Resection                            and retrieval were complete.                           Two sessile polyps were found in the descending                            colon. The polyps were 5 to 6 mm in size. These                            polyps were removed with a cold snare. Resection                            and retrieval were complete.                           Internal hemorrhoids were found during                            retroflexion. The hemorrhoids were medium-sized. Complications:            No immediate complications. Estimated Blood Loss:     Estimated blood loss was minimal. Impression:               - One 10 mm polyp in the transverse colon, removed                            with a cold snare. Resected and retrieved.                           - Two 3 to 5 mm polyps in the transverse colon,                             removed with a cold snare. Resected and retrieved.                           - Two 5 to 6 mm polyps in the descending colon,                            removed with a cold snare. Resected and retrieved.                           - Internal hemorrhoids. Recommendation:           - Patient has a contact number available for                            emergencies. The signs and symptoms of potential  delayed complications were discussed with the                            patient. Return to normal activities tomorrow.                            Written discharge instructions were provided to the                            patient.                           - Resume previous diet.                           - Continue present medications.                           - Await pathology results.                           - Repeat colonoscopy is recommended for                            surveillance. The colonoscopy date will be                            determined after pathology results from today's                            exam become available for review. Jerene Bears, MD 01/10/2019 11:23:27 AM This report has been signed electronically.

## 2019-01-10 NOTE — Progress Notes (Signed)
[place] temps and CW vitals. SM

## 2019-01-11 ENCOUNTER — Telehealth: Payer: Self-pay | Admitting: Primary Care

## 2019-01-11 DIAGNOSIS — I10 Essential (primary) hypertension: Secondary | ICD-10-CM

## 2019-01-11 NOTE — Telephone Encounter (Signed)
Please notify patient that I reviewed his BP reading from our office and also with Dr. Hilarie Fredrickson and his BP is too high. I'd like to add in Amlodipine 5 mg to his regimen, is he agreeable? If so then will need follow up for BP in 2 weeks.

## 2019-01-14 ENCOUNTER — Encounter: Payer: Self-pay | Admitting: Internal Medicine

## 2019-01-14 ENCOUNTER — Telehealth: Payer: Self-pay

## 2019-01-14 NOTE — Telephone Encounter (Signed)
Left message on follow up call. 

## 2019-01-14 NOTE — Telephone Encounter (Signed)
  Follow up Call-  Call back number 01/10/2019  Post procedure Call Back phone  # [phone]  Permission to leave phone message Yes  Some recent data might be hidden     Patient questions:  Do you have a fever, pain , or abdominal swelling? No. Pain Score  0 *  Have you tolerated food without any problems? Yes.    Have you been able to return to your normal activities? Yes.    Do you have any questions about your discharge instructions: Diet   No. Medications  No. Follow up visit  No.  Do you have questions or concerns about your Care? No.  Actions: * If pain score is 4 or above: No action needed, pain <4. 1. Have you developed a fever since your procedure? no  2.   Have you had an respiratory symptoms (SOB or cough) since your procedure? no  3.   Have you tested positive for COVID 19 since your procedure no  4.   Have you had any family members/close contacts diagnosed with the COVID 19 since your procedure?  no   If yes to any of these questions please route to Joylene John, RN and Alphonsa Gin, Therapist, sports.

## 2019-01-15 MED ORDER — AMLODIPINE BESYLATE 5 MG PO TABS: 5.0000 mg | ORAL_TABLET | Freq: Every day | ORAL | 0 refills | Status: DC

## 2019-01-15 NOTE — Telephone Encounter (Signed)
Noted.  Prescription for amlodipine 5 mg sent to pharmacy.  We will see him for follow-up as recommended.

## 2019-01-15 NOTE — Telephone Encounter (Signed)
Spoken and notified patient of Jonathan Page comments. Patient verbalized understanding. Patient is agreeable to the amlodipine 5 mg. Patient is on the road so he will have to call back later today to schedule the follow up appointment.

## 2019-02-25 ENCOUNTER — Ambulatory Visit (INDEPENDENT_AMBULATORY_CARE_PROVIDER_SITE_OTHER): Payer: Medicare HMO | Admitting: Primary Care

## 2019-02-25 ENCOUNTER — Other Ambulatory Visit: Payer: Self-pay

## 2019-02-25 ENCOUNTER — Encounter: Payer: Self-pay | Admitting: Primary Care

## 2019-02-25 DIAGNOSIS — I10 Essential (primary) hypertension: Secondary | ICD-10-CM | POA: Diagnosis not present

## 2019-02-25 MED ORDER — AMLODIPINE BESYLATE 10 MG PO TABS: 10.0000 mg | ORAL_TABLET | Freq: Every day | ORAL | 0 refills | Status: DC

## 2019-02-25 NOTE — Assessment & Plan Note (Signed)
Slightly improved but still above goal, especially given history of diabetes. Increase Amlodipine to 10 mg, continue lisinopril 20 mg for BP control and renal protection.  He will send [place] BP readings in 2 weeks.

## 2019-02-25 NOTE — Patient Instructions (Signed)
We've increased the dose of your Amlodipine to 10 mg from 5 mg for better blood pressure control.   Continue taking Lisinopril 20 mg daily for blood pressure and kidney protection.  Please call me in two weeks with your blood pressure readings as discussed.  It was a pleasure to see you today!

## 2019-02-25 NOTE — Progress Notes (Signed)
Subjective:    Patient ID: Jonathan Page, male    DOB: 04-01-1951, 68 y.o.   MRN: [id number]  HPI  [name] is a 68 year old male with a history of type 2 diabetes, hypertension,  who presents today for follow up of hypertension.  He was initiated on Amlodipine 5 mg in late July 2020 due to elevated blood pressure readings from GI office and our office on lisinopril 20 mg alone. He was asked to follow up two weeks later for BP check, he is here today.  BP Readings from Last 3 Encounters:  02/25/19 (!) 140/92  01/10/19 (!) 147/77  01/03/19 (!) 142/86   Since his last visit he's checking his BP which is running 140's/90's. He denies headaches, dizziness, chest pain. He's checking his glucose which is running 120's fasting in the AM. He has been compliant to both lisinopril and amlodipine as prescribed.   Review of Systems  Respiratory: Negative for shortness of breath.   Cardiovascular: Negative for chest pain.  Neurological: Negative for dizziness and headaches.       Past Medical History:  Diagnosis Date  . Cellulitis 02/21/2018  . Concussion syndrome   . Diabetes ([place])   . Frequent headaches   . Hypertension      Social History   Socioeconomic History  . Marital status: Divorced    Spouse name: Not on file  . Number of children: Not on file  . Years of education: Not on file  . Highest education level: Not on file  Occupational History  . Not on file  Social Needs  . Financial resource strain: Not on file  . Food insecurity    Worry: Not on file    Inability: Not on file  . Transportation needs    Medical: Not on file    Non-medical: Not on file  Tobacco Use  . Smoking status: Former Smoker    Packs/day: 0.50    Years: 30.00    Pack years: 15.00    Types: Cigarettes    Quit date: 10/2018    Years since quitting: 0.3  . Smokeless tobacco: Never Used  Substance and Sexual Activity  . Alcohol use: Yes    Alcohol/week: 6.0 standard drinks    Types: 6  Cans of beer per week  . Drug use: No  . Sexual activity: Not on file  Lifestyle  . Physical activity    Days per week: Not on file    Minutes per session: Not on file  . Stress: Not on file  Relationships  . Social Herbalist on phone: Not on file    Gets together: Not on file    Attends religious service: Not on file    Active member of club or organization: Not on file    Attends meetings of clubs or organizations: Not on file    Relationship status: Not on file  . Intimate partner violence    Fear of current or ex partner: Not on file    Emotionally abused: Not on file    Physically abused: Not on file    Forced sexual activity: Not on file  Other Topics Concern  . Not on file  Social History Narrative   Divorced.   2 children, 1 grandchild.   Retired. Once worked as a Catering manager.    Enjoys relaxing.     Past Surgical History:  Procedure Laterality Date  . APPENDECTOMY  1960  . COLONOSCOPY    .  WISDOM TOOTH EXTRACTION      Family History  Problem Relation Age of Onset  . Diabetes Father   . Diabetes Paternal Grandfather   . Diabetes Paternal Grandmother   . Colon cancer Neg Hx   . Crohn's disease Neg Hx   . Rectal cancer Neg Hx   . Stomach cancer Neg Hx   . Esophageal cancer Neg Hx     No Known Allergies  Current Outpatient Medications on File Prior to Visit  Medication Sig Dispense Refill  . aspirin EC 81 MG tablet Take 81 mg by mouth daily.    . Blood Glucose Monitoring Suppl (ACCU-CHEK AVIVA PLUS) w/Device KIT Check blood sugar before breakfast, lunch and bedtime and as directed. Dx E11.65 1 kit 0  . glucose blood (ACCU-CHEK AVIVA PLUS) test strip Check blood sugar before breakfast, lunch and bedtime and as directed. Dx E11.65 300 each 1  . Insulin NPH Isophane & Regular (RELION 70/30 [place]) Inject 15 Units into the skin 2 (two) times a day.    . Lancets (ACCU-CHEK SOFT TOUCH) lancets Check blood sugar before breakfast, lunch and bedtime  and as directed. Dx E11.65 300 each 1  . lisinopril (ZESTRIL) 20 MG tablet Take 1 tablet (20 mg total) by mouth daily. For blood pressure. 90 tablet 3   No current facility-administered medications on file prior to visit.     BP (!) 140/92   Pulse 78   Temp 97.8 F (36.6 C) (Temporal)   Ht 6' 1" (1.854 m)   Wt 200 lb 12 oz (91.1 kg)   SpO2 97%   BMI 26.49 kg/m    Objective:   Physical Exam  Constitutional: He appears well-nourished.  Neck: Neck supple.  Cardiovascular: Normal rate and regular rhythm.  Respiratory: Effort normal and breath sounds normal.  Skin: Skin is warm and dry.  Psychiatric: He has a normal mood and affect.           Assessment & Plan:   

## 2019-03-18 ENCOUNTER — Other Ambulatory Visit: Payer: Self-pay | Admitting: Nephrology

## 2019-03-18 DIAGNOSIS — N1831 Chronic kidney disease, stage 3a: Secondary | ICD-10-CM

## 2019-03-24 IMAGING — CT CT HEAD W/O CM
4 series · 16 of 47 positions shown, 18 images · non-contrast
Comparison: None.

CLINICAL DATA: Syncope with fall.  Tremors.

EXAM:
CT HEAD WITHOUT CONTRAST
TECHNIQUE: Contiguous axial images were obtained from the base of the skull
through the vertex without intravenous contrast.

[Series 3: head without · axial · non-contrast · 0.46mm/px · z∈[+997,+1117]mm · 7 of 34 slices shown, 9 images]
[im 5/34  brain]
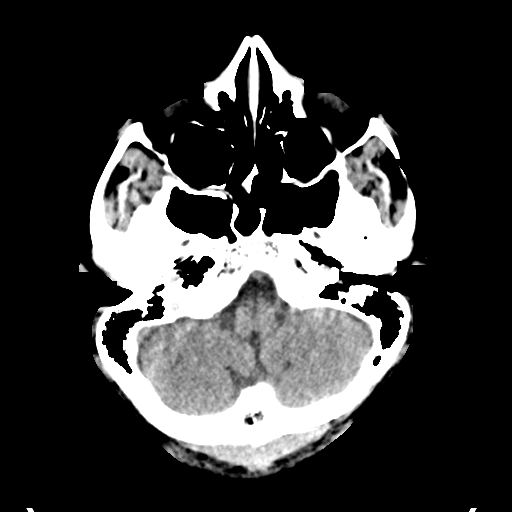
[im 5/34  bone]
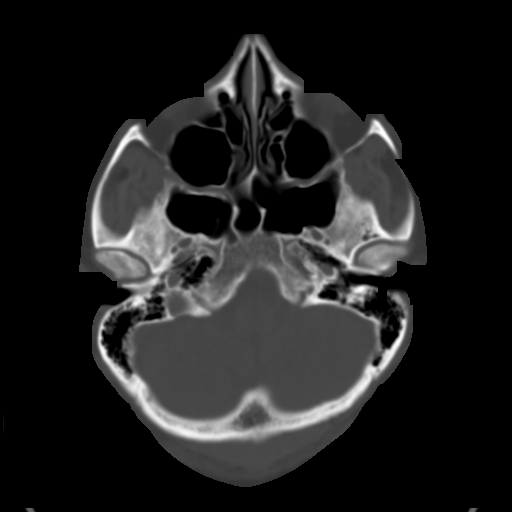
[im 9/34  brain]
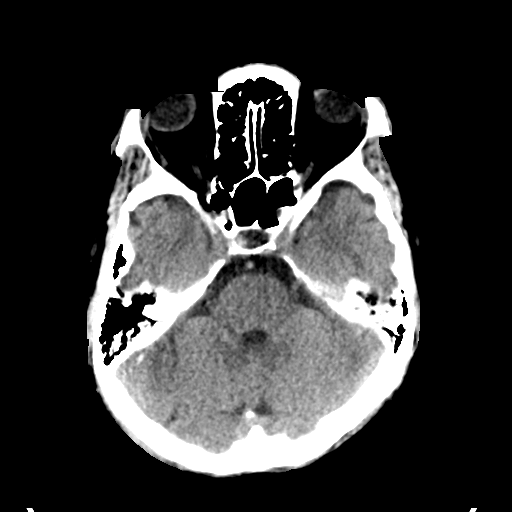
[im 13/34  brain]
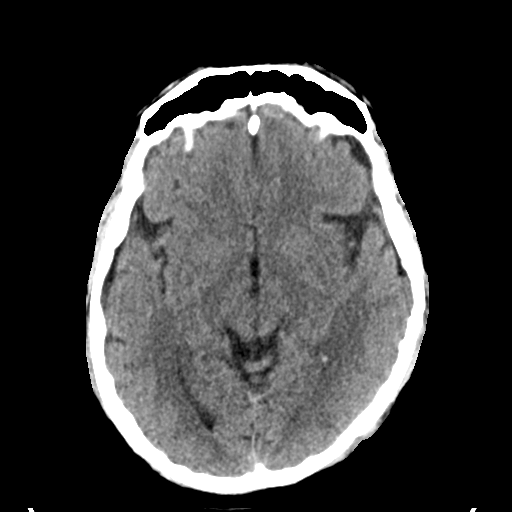
[im 17/34  brain]
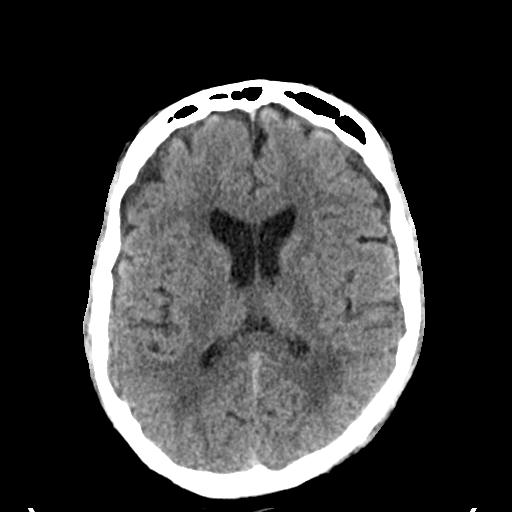
[im 21/34  brain]
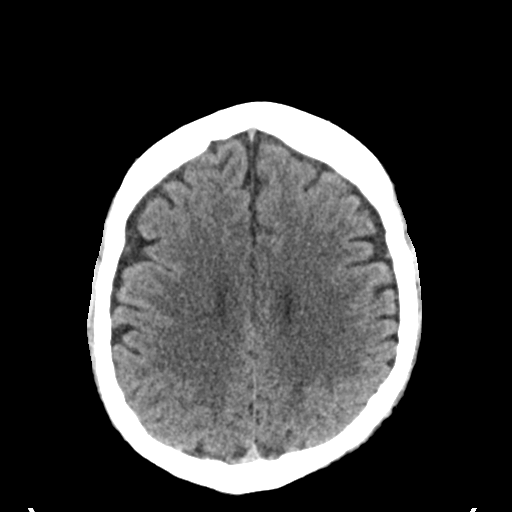
[im 21/34  bone]
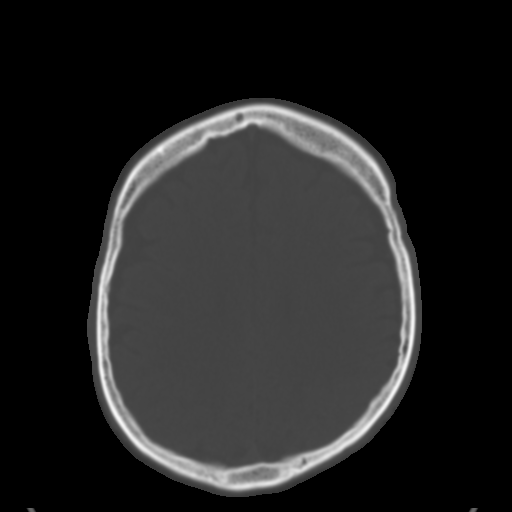
[im 25/34  brain]
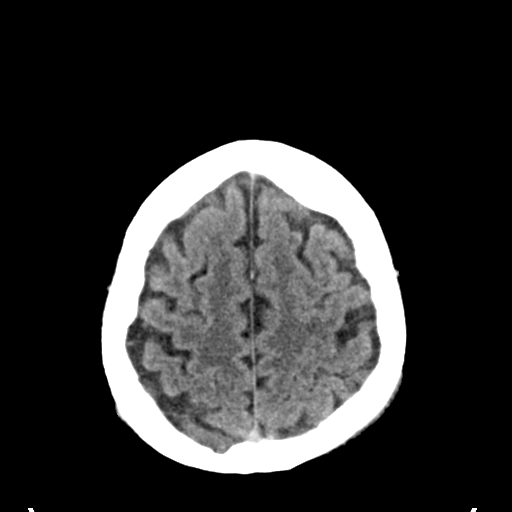
[im 29/34  brain]
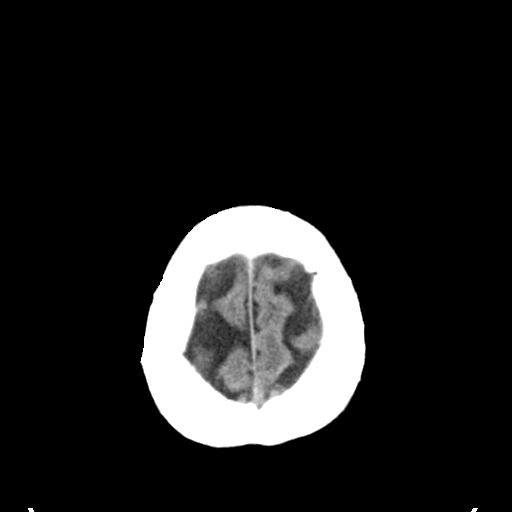

[Series 4: head bone · axial · 0.46mm/px · z∈[+993,+1025]mm · 3 of 84 slices shown]
[im 9/84  bone]
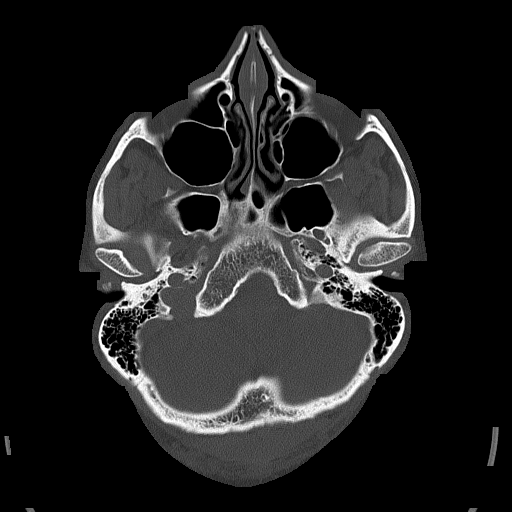
[im 17/84  bone]
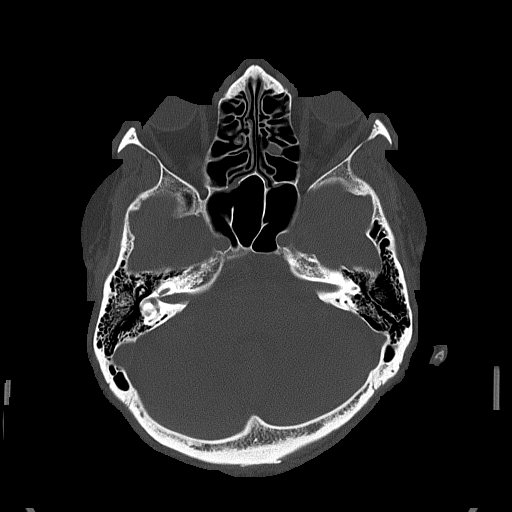
[im 25/84  bone]
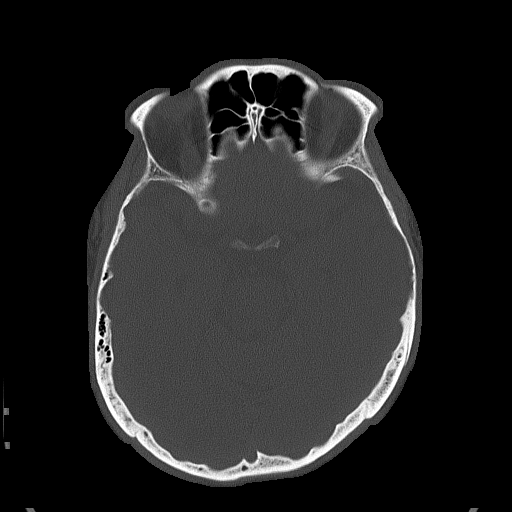

[Series 5: head without cor · coronal · non-contrast · 0.37mm/px · 3 of 66 slices shown]
[im 22/66  brain]
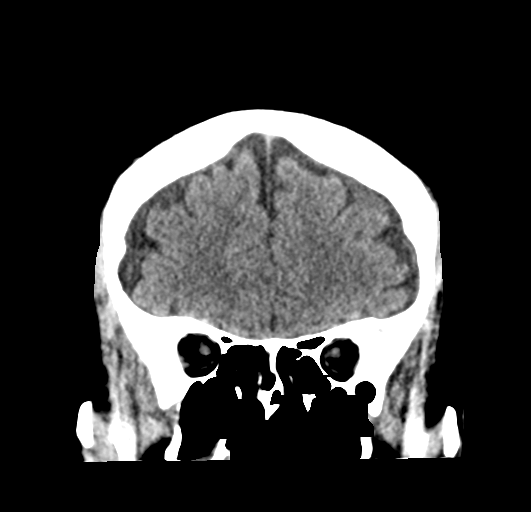
[im 29/66  brain]
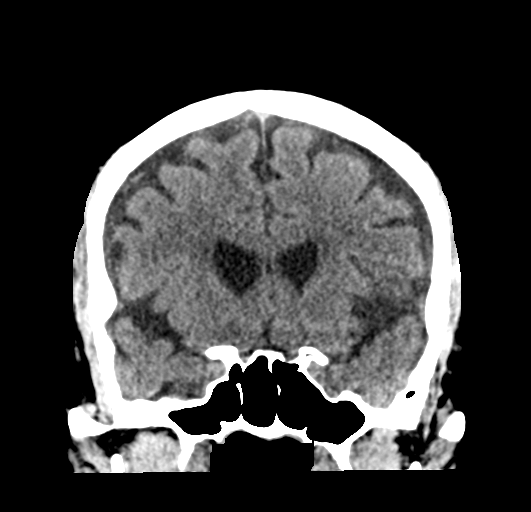
[im 37/66  brain]
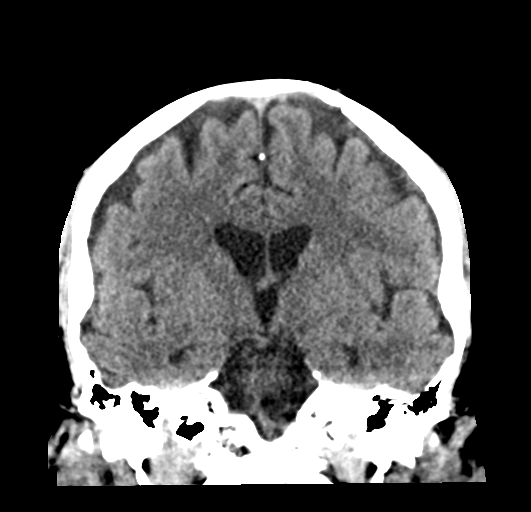

[Series 6: head without sag · sagittal · non-contrast · 0.40mm/px · 3 of 67 slices shown]
[im 23/67  brain]
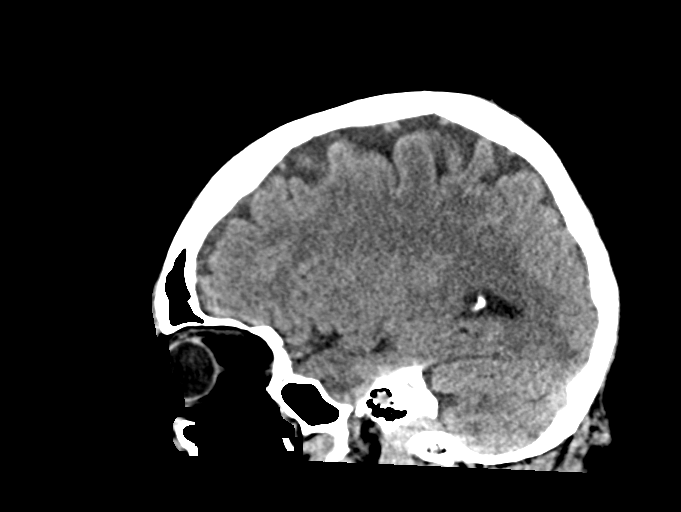
[im 34/67  brain]
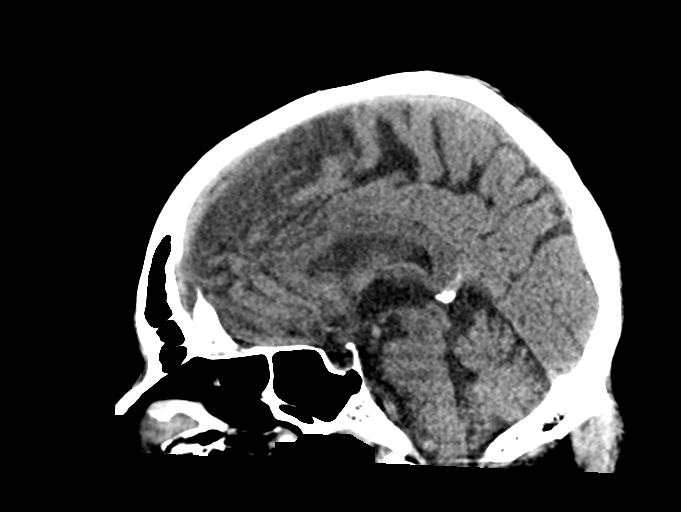
[im 45/67  brain]
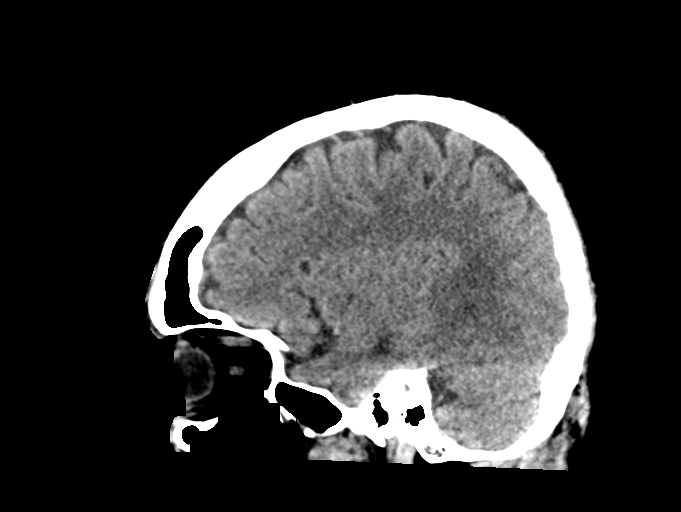

[16 of 47 positions shown; findings below may reference images not displayed]

FINDINGS: Brain: There is mild diffuse atrophy. There is no intracranial mass,
hemorrhage, extra-axial fluid collection, or midline shift. There is
patchy small vessel disease in the centra semiovale bilaterally.
Elsewhere gray-white compartments appear normal. No acute infarct
evident.

Vascular: There is no evident hyperdense vessel. There is no
appreciable vascular calcification.

Skull: The bony calvarium appears intact. There is a slight left
parietal scalp hematoma.

Sinuses/Orbits: There is mucosal thickening in several ethmoid air
cells bilaterally. There is mucosal thickening in both maxillary
antra, slightly more on the right than on the left. Other visualized
paranasal sinuses are clear. Orbits appear symmetric bilaterally.

Other: Visualized mastoid air cells are clear.

On the initial CT image, the visualized left parotid appears
slightly irregular and inhomogeneous in attenuation compared to the
normal-appearing right side.
IMPRESSION: Mild atrophy. Patchy periventricular small vessel disease. No
intracranial mass, hemorrhage, or extra-axial fluid collection. No
acute infarct. There is a slight left parietal scalp hematoma.

Areas of paranasal sinus disease.

On the initial slice, the visualized left parotid gland appears
somewhat irregular in contour and mildly inhomogeneous in
attenuation. Significance of this finding is uncertain. If there is
concern for pathology of the left parotid region, CT or MR of this
area to further evaluate would be reasonable.

## 2019-03-29 ENCOUNTER — Ambulatory Visit: Payer: Medicare HMO

## 2019-04-19 ENCOUNTER — Ambulatory Visit: Payer: Medicare HMO

## 2019-08-03 ENCOUNTER — Ambulatory Visit: Payer: Medicare HMO | Attending: Internal Medicine

## 2019-08-03 DIAGNOSIS — Z23 Encounter for immunization: Secondary | ICD-10-CM | POA: Insufficient documentation

## 2019-08-03 NOTE — Progress Notes (Signed)
   Covid-19 Vaccination Clinic  Name:  Jonathan Page    MRN: [id number] DOB: [birth year]  08/03/2019  Mr. [name] was observed post Covid-19 immunization for 15 minutes without incidence. He was provided with Vaccine Information Sheet and instruction to access the V-Safe system.   Mr. Beach was instructed to call 911 with any severe reactions post vaccine: Marland Kitchen Difficulty breathing  . Swelling of your face and throat  . A fast heartbeat  . A bad rash all over your body  . Dizziness and weakness    Immunizations Administered    Name Date Dose VIS Date Route   Pfizer COVID-19 Vaccine 08/03/2019  1:03 PM 0.3 mL 05/24/2019 Intramuscular   Manufacturer: [place]   Lot: [id number]   [place]: [id number]

## 2019-08-28 ENCOUNTER — Ambulatory Visit: Payer: Medicare HMO | Attending: Internal Medicine

## 2019-08-28 DIAGNOSIS — Z23 Encounter for immunization: Secondary | ICD-10-CM

## 2019-08-28 NOTE — Progress Notes (Signed)
   Covid-19 Vaccination Clinic  Name:  Jonathan Page    MRN: [id number] DOB: 06-23-1950  08/28/2019  Mr. [name] was observed post Covid-19 immunization for 15 minutes without incident. He was provided with Vaccine Information Sheet and instruction to access the V-Safe system.   Mr. [name] was instructed to call 911 with any severe reactions post vaccine: Marland Kitchen Difficulty breathing  . Swelling of face and throat  . A fast heartbeat  . A bad rash all over body  . Dizziness and weakness   Immunizations Administered    Name Date Dose VIS Date Route   Pfizer COVID-19 Vaccine 08/28/2019  9:54 AM 0.3 mL 05/24/2019 Intramuscular   Manufacturer: [place]   Lot: [id number]   [place]: [id number]

## 2020-01-17 ENCOUNTER — Encounter: Payer: Self-pay | Admitting: Primary Care

## 2020-09-14 ENCOUNTER — Telehealth: Payer: Self-pay

## 2020-09-14 NOTE — Telephone Encounter (Signed)
noted 

## 2020-09-14 NOTE — Telephone Encounter (Signed)
Pt came to clinic today directly from work requesting assessment of laceration that just occurred at work on vent hood at work, [employer]. Pt was concerned because he is a diabetic. Pt was not in any distress from the cut at the time of entering the clinic and the cut had stopped bleeding by the time he entered the clinic.  Pt presented with half-inch wide superficial laceration on the R dorsum side of hand between thumb and forefinger. Reports a lot of bleeding when injury occurred at work about 20 minutes before coming to the clinic. Bleeding has stopped at the time in the clinic. No loss in movement of hand of fingers. Laceration was flushed with water, cleaned with sterile gauze, and noted to have stopped bleeding. Applied antibiotic ointment and bandage and educated pt on signs and symptoms of infection. Advised if any signs or symptoms occur to contact office immediately. 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 Advised he should not have his hand in water consistently until it has healed. He said he would let them know. Advised to f/u if any signs and symptoms or if any other concerns. Advised to change bandage and use abx ointment for several days until healing begins. Pt verbalized understanding.

## 2021-04-15 DIAGNOSIS — E119 Type 2 diabetes mellitus without complications: Secondary | ICD-10-CM | POA: Diagnosis not present

## 2021-04-15 DIAGNOSIS — Z0100 Encounter for examination of eyes and vision without abnormal findings: Secondary | ICD-10-CM | POA: Diagnosis not present

## 2021-04-15 LAB — HM DIABETES EYE EXAM

## 2021-04-16 ENCOUNTER — Other Ambulatory Visit: Payer: Self-pay

## 2021-04-16 ENCOUNTER — Encounter: Payer: Self-pay | Admitting: Primary Care

## 2021-04-16 ENCOUNTER — Ambulatory Visit (INDEPENDENT_AMBULATORY_CARE_PROVIDER_SITE_OTHER): Payer: Medicare HMO | Admitting: Primary Care

## 2021-04-16 VITALS — BP 140/88 | HR 85 | Temp 97.2°F | Ht 73.0 in | Wt 191.0 lb

## 2021-04-16 DIAGNOSIS — E785 Hyperlipidemia, unspecified: Secondary | ICD-10-CM

## 2021-04-16 DIAGNOSIS — Z794 Long term (current) use of insulin: Secondary | ICD-10-CM

## 2021-04-16 DIAGNOSIS — E119 Type 2 diabetes mellitus without complications: Secondary | ICD-10-CM

## 2021-04-16 DIAGNOSIS — Z23 Encounter for immunization: Secondary | ICD-10-CM

## 2021-04-16 DIAGNOSIS — I10 Essential (primary) hypertension: Secondary | ICD-10-CM | POA: Diagnosis not present

## 2021-04-16 DIAGNOSIS — L989 Disorder of the skin and subcutaneous tissue, unspecified: Secondary | ICD-10-CM | POA: Diagnosis not present

## 2021-04-16 DIAGNOSIS — N1831 Chronic kidney disease, stage 3a: Secondary | ICD-10-CM

## 2021-04-16 DIAGNOSIS — E1165 Type 2 diabetes mellitus with hyperglycemia: Secondary | ICD-10-CM | POA: Diagnosis not present

## 2021-04-16 LAB — POCT GLYCOSYLATED HEMOGLOBIN (HGB A1C): Hemoglobin A1C: 7.3 % — AB (ref 4.0–5.6)

## 2021-04-16 LAB — COMPREHENSIVE METABOLIC PANEL
ALT: 19 U/L (ref 0–53)
AST: 20 U/L (ref 0–37)
Albumin: 4.3 g/dL (ref 3.5–5.2)
Alkaline Phosphatase: 52 U/L (ref 39–117)
BUN: 21 mg/dL (ref 6–23)
CO2: 29 mEq/L (ref 19–32)
Calcium: 9.4 mg/dL (ref 8.4–10.5)
Chloride: 104 mEq/L (ref 96–112)
Creatinine, Ser: 1.67 mg/dL — ABNORMAL HIGH (ref 0.40–1.50)
GFR: 41.38 mL/min — ABNORMAL LOW (ref >60.00)
Glucose, Bld: 265 mg/dL — ABNORMAL HIGH (ref 70–99)
Potassium: 4.2 mEq/L (ref 3.5–5.1)
Sodium: 140 mEq/L (ref 135–145)
Total Bilirubin: 0.6 mg/dL (ref 0.2–1.2)
Total Protein: 6.8 g/dL (ref 6.0–8.3)

## 2021-04-16 LAB — CBC
HCT: 44.9 % (ref 39.0–52.0)
Hemoglobin: 14.8 g/dL (ref 13.0–17.0)
MCHC: 33.1 g/dL (ref 30.0–36.0)
MCV: 93.2 fl (ref 78.0–100.0)
Platelets: 143 10*3/uL — ABNORMAL LOW (ref 150.0–400.0)
RBC: 4.82 Mil/uL (ref 4.22–5.81)
RDW: 15.4 % (ref 11.5–15.5)
WBC: 4.2 10*3/uL (ref 4.0–10.5)

## 2021-04-16 LAB — LIPID PANEL
Cholesterol: 141 mg/dL (ref 0–200)
HDL: 55.7 mg/dL (ref >39.00)
LDL Cholesterol: 66 mg/dL (ref 0–99)
NonHDL: 84.96
Total CHOL/HDL Ratio: 3
Triglycerides: 97 mg/dL (ref 0.0–149.0)
VLDL: 19.4 mg/dL (ref 0.0–40.0)

## 2021-04-16 MED ORDER — LISINOPRIL 10 MG PO TABS: 10.0000 mg | ORAL_TABLET | Freq: Every day | ORAL | 3 refills | Status: DC

## 2021-04-16 NOTE — Assessment & Plan Note (Signed)
Above goal today, also with home readings.   Does not need amlodipine 10 mg, but will resume lisinopril at 10 mg.  CMP pending today. We will see him back in a few months for follow up. He will monitor BP at home.

## 2021-04-16 NOTE — Assessment & Plan Note (Addendum)
Appears to represent plantar wart.  Patient agrees to cryotherapy.  Three rounds applied. Patient tolerated well. Home care instructions provided.  He may need to return in 1-2 weeks.

## 2021-04-16 NOTE — Patient Instructions (Addendum)
We will resume your lisinopril medication for blood pressure and kidney protection. Take this once daily.    Continue 15 units of your insulin once daily.   Please update me if the spot on your foot does not improve.  It was a pleasure to see you today!        Cryosurgery post-operative instructions You have been treated with "cryosurgery." The treated area should be destroyed by this spray of liquid nitrogen. The care of the site should be as follows:  Within 24 hours after freezing, a small blister may form at the site. This blister may be filled with either clear fluid or bloody fluid. If the blister is causing a great deal of pain, you should cleanse a needle or pin with rubbing alcohol and puncture the blister to drain it. If the blister is not painful, you should just it heal by itself.  No special care other than washing the site with soap and water is necessary. The blister should resolve in approximately five to ten days leaving a small crust at the site. The crust should be allowed to come off on its own. Do not pick at or manipulate the crust.  No bandage is typically needed after cryosurgery. However, if you have punctured a large, tender blister, application of Vaseline ointment and a band-aid may help with any discomfort.  After four weeks if the treated lesion has not resolved, please make an appointment for follow-up evaluation.

## 2021-04-16 NOTE — Assessment & Plan Note (Signed)
Repeat renal function pending. Will resume lisinopril 10 mg.  CMP pending.

## 2021-04-16 NOTE — Assessment & Plan Note (Signed)
No follow up in 2 years, has been purchasing NPH insulin OTC. Overall controlled with A1C of 7.3.  Continue NPH 15 units once daily for now. Will resume lisinopril for renal protection and BP control. Foot exam today. Eye exam UTD. Pneumonia vaccine UTD.  Follow up in 3 months.

## 2021-04-16 NOTE — Progress Notes (Signed)
Subjective:    Patient ID: Jonathan Page, male    DOB: 10/31/1950, 70 y.o.   MRN: [id number]  HPI  Jonathan Page is a very pleasant 70 y.o. male with a history of type 2 diabetes, hypertension, renal failure, hyperlipidemia who presents today to discuss foot pain and diabetes follow up. He has not been seen since 2020.  1) Foot Pain:  His pain is located to the right plantar foot distal to the 5th metatarsal joint. He thinks he may have stepped on "something" in the kitchen about 6 months ago, never felt anything at the time. Since then he's noticed his shoes rubbing up against the spot which causes irritation. One month ago he received a pedicure and noticed increased pain to the site after a massage.   He denies redness, warmth, drainage.   2) Type 2 Diabetes:  Current medications include: NPH insulin 15 units once daily.   He is checking his/her blood glucose 1 times daily and is getting readings of 90's-low 100's.   Last A1C: 6.3 in June 2020 Last Eye Exam: UTD Last Foot Exam: Due Pneumonia Vaccination: 2018 Urine Microalbumin: Previously managed on ACE-I, will reinitiate.  Statin: None.  3) Essential Hypertension: Previously prescribed lisinopril 20 mg and amlodipine 10 mg. He has not taken either medications in over one year. He is checking BP at home which is running 140's/80's-90's.   He denies chest pain, dizziness, headaches.   BP Readings from Last 3 Encounters:  04/16/21 140/88  02/25/19 (!) 140/92  01/10/19 (!) 147/77      Review of Systems  Eyes:  Negative for visual disturbance.  Respiratory:  Negative for shortness of breath.   Cardiovascular:  Negative for chest pain.  Skin:  Negative for color change and rash.       Spot on right foot  Neurological:  Negative for dizziness and headaches.        Past Medical History:  Diagnosis Date   Cellulitis 02/21/2018   Concussion syndrome    Diabetes ([place])    Frequent headaches    Hypertension      Social History   Socioeconomic History   Marital status: Divorced    Spouse name: Not on file   Number of children: Not on file   Years of education: Not on file   Highest education level: Not on file  Occupational History   Not on file  Tobacco Use   Smoking status: Former    Packs/day: 0.50    Years: 30.00    Pack years: 15.00    Types: Cigarettes    Quit date: 10/2018    Years since quitting: 2.5   Smokeless tobacco: Never  Vaping Use   Vaping Use: Never used  Substance and Sexual Activity   Alcohol use: Yes    Alcohol/week: 6.0 standard drinks    Types: 6 Cans of beer per week   Drug use: No   Sexual activity: Not on file  Other Topics Concern   Not on file  Social History Narrative   Divorced.   2 children, 1 grandchild.   Retired. Once worked as a Catering manager.    Enjoys relaxing.    Social Determinants of Health   Financial Resource Strain: Not on file  Food Insecurity: Not on file  Transportation Needs: Not on file  Physical Activity: Not on file  Stress: Not on file  Social Connections: Not on file  Intimate Partner Violence: Not on file  Past Surgical History:  Procedure Laterality Date   APPENDECTOMY  1960   COLONOSCOPY     WISDOM TOOTH EXTRACTION      Family History  Problem Relation Age of Onset   Diabetes Father    Diabetes Paternal Grandfather    Diabetes Paternal Grandmother    Colon cancer Neg Hx    Crohn's disease Neg Hx    Rectal cancer Neg Hx    Stomach cancer Neg Hx    Esophageal cancer Neg Hx     No Known Allergies  Current Outpatient Medications on File Prior to Visit  Medication Sig Dispense Refill   aspirin EC 81 MG tablet Take 81 mg by mouth daily.     Blood Glucose Monitoring Suppl (ACCU-CHEK AVIVA PLUS) w/Device KIT Check blood sugar before breakfast, lunch and bedtime and as directed. Dx E11.65 1 kit 0   glucose blood (ACCU-CHEK AVIVA PLUS) test strip Check blood sugar before breakfast, lunch and bedtime  and as directed. Dx E11.65 300 each 1   Insulin NPH Isophane & Regular (RELION 70/30 [place]) Inject 15 Units into the skin daily before breakfast.     Lancets (ACCU-CHEK SOFT TOUCH) lancets Check blood sugar before breakfast, lunch and bedtime and as directed. Dx E11.65 300 each 1   No current facility-administered medications on file prior to visit.    BP 140/88   Pulse 85   Temp (!) 97.2 F (36.2 C) (Temporal)   Ht '6\' 1"'  (1.854 m)   Wt 191 lb (86.6 kg)   SpO2 96%   BMI 25.20 kg/m  Objective:   Physical Exam Cardiovascular:     Rate and Rhythm: Normal rate and regular rhythm.  Pulmonary:     Effort: Pulmonary effort is normal.     Breath sounds: Normal breath sounds. No wheezing or rales.  Musculoskeletal:     Cervical back: Neck supple.  Skin:    General: Skin is warm and dry.     Findings: No erythema.     Comments: 2 mm rounded, slightly raised, scaly, lesion to right plantar foot slightly proximal 5th metatarsal joint. Non tender.  Neurological:     Mental Status: He is alert and oriented to person, place, and time.          Assessment & Plan:      This visit occurred during the SARS-CoV-2 public health emergency.  Safety protocols were in place, including screening questions prior to the visit, additional usage of staff PPE, and extensive cleaning of exam room while observing appropriate contact time as indicated for disinfecting solutions.

## 2021-04-16 NOTE — Assessment & Plan Note (Signed)
Not on statin therapy. Repeat lipid panel pending.  Discussed the likely need for statin, especially given ASCVD. He will think about this.

## 2021-05-04 ENCOUNTER — Encounter: Payer: Self-pay | Admitting: Primary Care

## 2021-07-20 ENCOUNTER — Ambulatory Visit (INDEPENDENT_AMBULATORY_CARE_PROVIDER_SITE_OTHER): Payer: Medicare HMO | Admitting: Primary Care

## 2021-07-20 ENCOUNTER — Other Ambulatory Visit: Payer: Self-pay

## 2021-07-20 ENCOUNTER — Encounter: Payer: Self-pay | Admitting: Primary Care

## 2021-07-20 ENCOUNTER — Telehealth: Payer: Self-pay | Admitting: Primary Care

## 2021-07-20 VITALS — BP 130/82 | HR 82 | Temp 97.2°F | Ht 73.0 in | Wt 194.0 lb

## 2021-07-20 DIAGNOSIS — Z794 Long term (current) use of insulin: Secondary | ICD-10-CM

## 2021-07-20 DIAGNOSIS — I10 Essential (primary) hypertension: Secondary | ICD-10-CM

## 2021-07-20 DIAGNOSIS — E785 Hyperlipidemia, unspecified: Secondary | ICD-10-CM | POA: Diagnosis not present

## 2021-07-20 DIAGNOSIS — E1165 Type 2 diabetes mellitus with hyperglycemia: Secondary | ICD-10-CM

## 2021-07-20 DIAGNOSIS — N1831 Chronic kidney disease, stage 3a: Secondary | ICD-10-CM | POA: Diagnosis not present

## 2021-07-20 DIAGNOSIS — Z0000 Encounter for general adult medical examination without abnormal findings: Secondary | ICD-10-CM

## 2021-07-20 LAB — BASIC METABOLIC PANEL
BUN: 19 mg/dL (ref 6–23)
CO2: 32 mEq/L (ref 19–32)
Calcium: 9.6 mg/dL (ref 8.4–10.5)
Chloride: 102 mEq/L (ref 96–112)
Creatinine, Ser: 1.54 mg/dL — ABNORMAL HIGH (ref 0.40–1.50)
GFR: 45.52 mL/min — ABNORMAL LOW (ref >60.00)
Glucose, Bld: 244 mg/dL — ABNORMAL HIGH (ref 70–99)
Potassium: 4.3 mEq/L (ref 3.5–5.1)
Sodium: 138 mEq/L (ref 135–145)

## 2021-07-20 LAB — CBC
HCT: 44.7 % (ref 39.0–52.0)
Hemoglobin: 14.7 g/dL (ref 13.0–17.0)
MCHC: 32.8 g/dL (ref 30.0–36.0)
MCV: 93.5 fl (ref 78.0–100.0)
Platelets: 151 10*3/uL (ref 150.0–400.0)
RBC: 4.77 Mil/uL (ref 4.22–5.81)
RDW: 15 % (ref 11.5–15.5)
WBC: 4.7 10*3/uL (ref 4.0–10.5)

## 2021-07-20 LAB — HEMOGLOBIN A1C: Hgb A1c MFr Bld: 7.3 % — ABNORMAL HIGH (ref 4.6–6.5)

## 2021-07-20 MED ORDER — PRAVASTATIN SODIUM 40 MG PO TABS: 40.0000 mg | ORAL_TABLET | Freq: Every day | ORAL | 3 refills | Status: DC

## 2021-07-20 NOTE — Assessment & Plan Note (Signed)
Now managed on ACE-I. Repeat BMP pending.

## 2021-07-20 NOTE — Telephone Encounter (Signed)
See lab notes for documentation

## 2021-07-20 NOTE — Assessment & Plan Note (Signed)
Immunizations UTD. PSA due and pending. Colonoscopy due Summer 2023, he is aware.  Discussed the importance of a healthy diet and regular exercise in order for weight loss, and to reduce the risk of further co-morbidity.  Exam today stable. Labs pending and also reviewed.

## 2021-07-20 NOTE — Assessment & Plan Note (Signed)
Repeat A1C pending.  Continue NPH insulin 15 units once daily. Managed on ACE-I. Not managed on statin therapy, LDL of 66 in November 2022.  Pneumonia vaccine UTD. Foot and eye exams UTD.  Follow up in 6 months

## 2021-07-20 NOTE — Assessment & Plan Note (Addendum)
Improved and at goal on lisinopril 10 mg, continue same.   Repeat BMP pending today. Reviewed CMP from November 2022.

## 2021-07-20 NOTE — Patient Instructions (Signed)
Start pravastatin 40 mg once daily for cholesterol.   Continue taking lisinopril 10 mg once daily for blood pressure. Call [facility] for a refill.   Stop by the lab prior to leaving today. I will notify you of your results once received.   We will see you in 6 months.  It was a pleasure to see you today!

## 2021-07-20 NOTE — Telephone Encounter (Signed)
Returning phone call from joellen  

## 2021-07-20 NOTE — Progress Notes (Signed)
Subjective:    Patient ID: Jonathan Page, male    DOB: [birth year], 71 y.o.   MRN: [id number]  HPI  Jonathan Page is a very pleasant 71 y.o. male who presents today for complete physical and follow up of chronic conditions.  Evaluated last in November 2022, BP was noted to be above goal but he had not taken amlodipine 10 mg or lisinopril 20 mg for the prior year. During his visit in November 2022 we resumed lisinopril at 10 mg and asked him to monitor BP. Today he endorses compliance to lisinopril 10 mg daily.     He would also like to discuss right hand tingling, intermittent, radiates to mid humerus region. He denies pain, joint swelling, left hand or arm symptoms. He's been using a squeeze ball at home which helps to loosen up the muscles in his fingers. 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 Symptoms improve if he's off for a few days.   Immunizations: -Influenza: Completed this season  -Covid-19: 2 vaccines  -Shingles: Completed 1 dose of Shingrix -Pneumonia: Prevnar 13 in 2018, Pneumovax in 2015  Diet: [place].  Exercise: No regular exercise.  Eye exam: Completes annually  Dental exam: Completes semi-annually   Colonoscopy: Completed in 2020, due Summer 2023 PSA: Due  BP Readings from Last 3 Encounters:  07/20/21 130/82  04/16/21 140/88  02/25/19 (!) 140/92   The 10-year ASCVD risk score (Arnett DK, et al., 2019) is: 30.9%   Values used to calculate the score:     Age: 18 years     Sex: Male     Is Non-Hispanic African American: Yes     Diabetic: Yes     Tobacco smoker: Yes     Systolic Blood Pressure: 629 mmHg     Is BP treated: No     HDL Cholesterol: 55.7 mg/dL     Total Cholesterol: 141 mg/dL     Review of Systems  Constitutional:  Negative for unexpected weight change.  HENT:  Negative for rhinorrhea.   Eyes:  Negative for visual disturbance.  Respiratory:  Negative for cough and shortness of breath.    Cardiovascular:  Negative for chest pain.  Gastrointestinal:  Negative for constipation and diarrhea.  Genitourinary:  Negative for difficulty urinating.  Musculoskeletal:  Negative for arthralgias and myalgias.  Skin:  Negative for rash.  Allergic/Immunologic: Negative for environmental allergies.  Neurological:  Positive for numbness. Negative for dizziness and headaches.  Psychiatric/Behavioral:  The patient is not nervous/anxious.         Past Medical History:  Diagnosis Date   Cellulitis 02/21/2018   Concussion syndrome    Diabetes ([place])    Frequent headaches    Hypertension     Social History   Socioeconomic History   Marital status: Divorced    Spouse name: Not on file   Number of children: Not on file   Years of education: Not on file   Highest education level: Not on file  Occupational History   Not on file  Tobacco Use   Smoking status: Former    Packs/day: 0.50    Years: 30.00    Pack years: 15.00    Types: Cigarettes    Quit date: 10/2018    Years since quitting: 2.7   Smokeless tobacco: Never  Vaping Use   Vaping Use: Never used  Substance and Sexual Activity   Alcohol use: Yes  Alcohol/week: 6.0 standard drinks    Types: 6 Cans of beer per week   Drug use: No   Sexual activity: Not on file  Other Topics Concern   Not on file  Social History Narrative   Divorced.   2 children, 1 grandchild.   Retired. Once worked as a Catering manager.    Enjoys relaxing.    Social Determinants of Health   Financial Resource Strain: Not on file  Food Insecurity: Not on file  Transportation Needs: Not on file  Physical Activity: Not on file  Stress: Not on file  Social Connections: Not on file  Intimate Partner Violence: Not on file    Past Surgical History:  Procedure Laterality Date   APPENDECTOMY  1960   COLONOSCOPY     WISDOM TOOTH EXTRACTION      Family History  Problem Relation Age of Onset   Diabetes Father    Diabetes Paternal  Grandfather    Diabetes Paternal Grandmother    Colon cancer Neg Hx    Crohn's disease Neg Hx    Rectal cancer Neg Hx    Stomach cancer Neg Hx    Esophageal cancer Neg Hx     No Known Allergies  Current Outpatient Medications on File Prior to Visit  Medication Sig Dispense Refill   aspirin EC 81 MG tablet Take 81 mg by mouth daily.     Blood Glucose Monitoring Suppl (ACCU-CHEK AVIVA PLUS) w/Device KIT Check blood sugar before breakfast, lunch and bedtime and as directed. Dx E11.65 1 kit 0   glucose blood (ACCU-CHEK AVIVA PLUS) test strip Check blood sugar before breakfast, lunch and bedtime and as directed. Dx E11.65 300 each 1   Insulin NPH Isophane & Regular (RELION 70/30 [place]) Inject 15 Units into the skin daily before breakfast.     Lancets (ACCU-CHEK SOFT TOUCH) lancets Check blood sugar before breakfast, lunch and bedtime and as directed. Dx E11.65 300 each 1   [DISCONTINUED] lisinopril (ZESTRIL) 10 MG tablet Take 1 tablet (10 mg total) by mouth daily. For blood pressure. 90 tablet 3   meloxicam (MOBIC) 7.5 MG tablet Take 7.5 mg by mouth 2 (two) times daily as needed.     No current facility-administered medications on file prior to visit.    BP 130/82    Pulse 82    Temp (!) 97.2 F (36.2 C) (Temporal)    Ht '6\' 1"'  (1.854 m)    Wt 194 lb (88 kg)    SpO2 97%    BMI 25.60 kg/m  Objective:   Physical Exam HENT:     Right Ear: Tympanic membrane and ear canal normal.     Left Ear: Tympanic membrane and ear canal normal.     Nose: Nose normal.     Right Sinus: No maxillary sinus tenderness or frontal sinus tenderness.     Left Sinus: No maxillary sinus tenderness or frontal sinus tenderness.  Eyes:     Conjunctiva/sclera: Conjunctivae normal.  Neck:     Thyroid: No thyromegaly.     Vascular: No carotid bruit.  Cardiovascular:     Rate and Rhythm: Normal rate and regular rhythm.     Heart sounds: Normal heart sounds.  Pulmonary:     Effort: Pulmonary effort is normal.      Breath sounds: Normal breath sounds. No wheezing or rales.  Abdominal:     General: Bowel sounds are normal.     Palpations: Abdomen is soft.     Tenderness:  There is no abdominal tenderness.  Musculoskeletal:        General: Normal range of motion.     Cervical back: Neck supple.  Skin:    General: Skin is warm and dry.  Neurological:     Mental Status: He is alert and oriented to person, place, and time.     Cranial Nerves: No cranial nerve deficit.     Deep Tendon Reflexes: Reflexes are normal and symmetric.  Psychiatric:        Mood and Affect: Mood normal.          Assessment & Plan:      This visit occurred during the SARS-CoV-2 public health emergency.  Safety protocols were in place, including screening questions prior to the visit, additional usage of staff PPE, and extensive cleaning of exam room while observing appropriate contact time as indicated for disinfecting solutions.

## 2021-07-20 NOTE — Assessment & Plan Note (Addendum)
Not on statin therapy, agrees today. Rx for pravastatin 40 mg sent to pharmacy.   Repeat lipids at next visit.  The 10-year ASCVD risk score (Arnett DK, et al., 2019) is: 30.9%   Values used to calculate the score:     Age: 71 years     Sex: Male     Is Non-Hispanic African American: Yes     Diabetic: Yes     Tobacco smoker: Yes     Systolic Blood Pressure: 790 mmHg     Is BP treated: No     HDL Cholesterol: 55.7 mg/dL     Total Cholesterol: 141 mg/dL

## 2021-12-10 ENCOUNTER — Telehealth: Payer: Self-pay | Admitting: Primary Care

## 2021-12-10 NOTE — Telephone Encounter (Signed)
Left message for patient to call back and schedule Medicare Annual Wellness Visit (AWV).   Please offer to do virtually or by telephone.   Last AWV:10/25/2018  Please schedule at anytime with [facility] schedule 2  45 minute appointent  If any questions, please contact me at [phone]

## 2022-01-05 ENCOUNTER — Other Ambulatory Visit: Payer: Self-pay

## 2022-01-05 NOTE — Patient Outreach (Signed)
[place] [facility]) Care Management  01/05/2022  Jonathan Page 19-Oct-1950 [id number]   Telephone Screen    Outreach call to patient to introduce [facility] services and assess care needs as part of benefit of PCP office and insurance plan.No answer. RN CM left HIPAA compliant voicemail message along with contact info.     Plan: RN CM will make outreach attempt within 4 business days if no return call.   Enzo Montgomery, RN,BSN,CCM [place] Management Telephonic Care Management Coordinator Direct Phone: [phone] Toll Free: [phone] Fax: [phone];

## 2022-01-10 ENCOUNTER — Other Ambulatory Visit: Payer: Self-pay

## 2022-01-10 NOTE — Patient Outreach (Signed)
[facility]) Care Management  01/10/2022  MATEO OVERBECK 23-Mar-1951 [id number]   Telephone Screen       Outreach call to patient to introduce [facility] services and assess care needs as part of benefit of PCP office and insurance plan. Spoke with patient who reported he was not available to talk right now.     Plan: RN CM will make outreach attempt within 4 business days.   Enzo Montgomery, RN,BSN,CCM [place] Management Telephonic Care Management Coordinator Direct Phone: [phone] Toll Free: [phone] Fax: [phone]

## 2022-01-12 ENCOUNTER — Other Ambulatory Visit: Payer: Self-pay

## 2022-01-12 NOTE — Patient Outreach (Signed)
[place] [facility]) Care Management  01/12/2022  Jonathan Page Jan 11, 1951 [id number]   Telephone Screen       Unsuccessful outreach call to patient to introduce [facility] services and assess care needs as part of benefit of PCP office and insurance plan.      Plan: RN CM will make outreach attempt to patient within 3-4 wks if no response from letter mailed to patient.  Enzo Montgomery, RN,BSN,CCM [place] Management Telephonic Care Management Coordinator Direct Phone: [phone] Toll Free: [phone] Fax: [phone]

## 2022-01-21 ENCOUNTER — Ambulatory Visit: Payer: Medicare HMO | Admitting: Primary Care

## 2022-02-02 ENCOUNTER — Other Ambulatory Visit: Payer: Self-pay

## 2022-02-02 NOTE — Patient Outreach (Signed)
[place] [facility]) Care Management  02/02/2022  Jonathan Page Sep 25, 1950 [id number]   Telephone Screen     Multiple unsuccessful attempts to outreach patient to introduce [facility] services and assess care needs as part of benefit of PCP office and insurance plan.   Plan: RN CM will close case.   Jonathan Montgomery, RN,BSN,CCM [place] Management Telephonic Care Management Coordinator Direct Phone: [phone] Toll Free: [phone] Fax: [phone]

## 2022-02-10 ENCOUNTER — Encounter: Payer: Self-pay | Admitting: Internal Medicine

## 2022-03-25 ENCOUNTER — Ambulatory Visit (INDEPENDENT_AMBULATORY_CARE_PROVIDER_SITE_OTHER): Payer: Medicare HMO | Admitting: Primary Care

## 2022-03-25 VITALS — BP 152/88 | HR 85 | Temp 97.3°F | Ht 73.0 in | Wt 190.0 lb

## 2022-03-25 DIAGNOSIS — Z125 Encounter for screening for malignant neoplasm of prostate: Secondary | ICD-10-CM

## 2022-03-25 DIAGNOSIS — N183 Chronic kidney disease, stage 3 unspecified: Secondary | ICD-10-CM | POA: Diagnosis not present

## 2022-03-25 DIAGNOSIS — Z23 Encounter for immunization: Secondary | ICD-10-CM

## 2022-03-25 DIAGNOSIS — E1165 Type 2 diabetes mellitus with hyperglycemia: Secondary | ICD-10-CM

## 2022-03-25 DIAGNOSIS — E785 Hyperlipidemia, unspecified: Secondary | ICD-10-CM

## 2022-03-25 DIAGNOSIS — I10 Essential (primary) hypertension: Secondary | ICD-10-CM

## 2022-03-25 DIAGNOSIS — N1831 Chronic kidney disease, stage 3a: Secondary | ICD-10-CM

## 2022-03-25 DIAGNOSIS — E1122 Type 2 diabetes mellitus with diabetic chronic kidney disease: Secondary | ICD-10-CM | POA: Diagnosis not present

## 2022-03-25 DIAGNOSIS — Z794 Long term (current) use of insulin: Secondary | ICD-10-CM

## 2022-03-25 LAB — LIPID PANEL
Cholesterol: 147 mg/dL (ref 0–200)
HDL: 52.6 mg/dL (ref >39.00)
LDL Cholesterol: 70 mg/dL (ref 0–99)
NonHDL: 94.26
Total CHOL/HDL Ratio: 3
Triglycerides: 121 mg/dL (ref 0.0–149.0)
VLDL: 24.2 mg/dL (ref 0.0–40.0)

## 2022-03-25 LAB — COMPREHENSIVE METABOLIC PANEL
ALT: 24 U/L (ref 0–53)
AST: 17 U/L (ref 0–37)
Albumin: 4.4 g/dL (ref 3.5–5.2)
Alkaline Phosphatase: 64 U/L (ref 39–117)
BUN: 25 mg/dL — ABNORMAL HIGH (ref 6–23)
CO2: 25 mEq/L (ref 19–32)
Calcium: 9.4 mg/dL (ref 8.4–10.5)
Chloride: 104 mEq/L (ref 96–112)
Creatinine, Ser: 1.88 mg/dL — ABNORMAL HIGH (ref 0.40–1.50)
GFR: 35.66 mL/min — ABNORMAL LOW (ref >60.00)
Glucose, Bld: 185 mg/dL — ABNORMAL HIGH (ref 70–99)
Potassium: 3.9 mEq/L (ref 3.5–5.1)
Sodium: 139 mEq/L (ref 135–145)
Total Bilirubin: 0.9 mg/dL (ref 0.2–1.2)
Total Protein: 7 g/dL (ref 6.0–8.3)

## 2022-03-25 LAB — MICROALBUMIN / CREATININE URINE RATIO
Creatinine,U: 122.7 mg/dL
Microalb Creat Ratio: 14.3 mg/g (ref 0.0–30.0)
Microalb, Ur: 17.6 mg/dL — ABNORMAL HIGH (ref 0.0–1.9)

## 2022-03-25 LAB — PSA, MEDICARE: PSA: 2.08 ng/ml (ref 0.10–4.00)

## 2022-03-25 LAB — HEMOGLOBIN A1C: Hgb A1c MFr Bld: 7.5 % — ABNORMAL HIGH (ref 4.6–6.5)

## 2022-03-25 NOTE — Assessment & Plan Note (Signed)
Recommended he resume pravastatin 40 mg. He will pick up refill from pharmacy.  Repeat lipid panel pending today

## 2022-03-25 NOTE — Assessment & Plan Note (Signed)
No use of insulin in weeks.  Repeat A1C pending. Consider oral agent if A1C above goal for age.  Continue statin. Urine microalbumin ordered and pending.  Follow up in 3-6 months depending on A1C result.

## 2022-03-25 NOTE — Assessment & Plan Note (Signed)
Above goal today, also on recheck. Has not taken lisinopril in several weeks.  Discussed to resume lisinopril 10 mg daily.  CMP pending.

## 2022-03-25 NOTE — Patient Instructions (Signed)
Stop by the lab prior to leaving today. I will notify you of your results once received.   Resume your lisinopril for blood pressure and your pravastatin for cholesterol.  It was a pleasure to see you today!

## 2022-03-25 NOTE — Progress Notes (Signed)
Subjective:    Patient ID: Jonathan Page, male    DOB: 12/30/1950, 71 y.o.   MRN: [id number]  HPI  Jonathan Page is a very pleasant 71 y.o. male with a history of type 2 diabetes, hyperlipidemia, hypertension who presents today for follow up of chronic conditions.  1) Type 2 Diabetes:  Current medications include: ReliOn 70/30 15 units daily. He's not been taking his insulin for a few weeks, has been taking a fruit from [facility] to manage his diabetes.   He is checking his blood glucose a few times times weekly and is getting readings of 80's-90's.   Last A1C: 7.3 in February 2023 Last Eye Exam: UTD Last Foot Exam: UTD Pneumonia Vaccination: 2018 Urine Microalbumin: Due Statin: pravastatin   Dietary changes since last visit: He's eating smaller portion sizes, more baked foods.    Exercise: He is exercising several days weekly.   2) Essential Hypertension: Currently managed on lisinopril 10 mg daily. He does not check his BP at home. He is under a lot of stress as he's taking care of his mother who has advanced Alzhemier's.   He's not been taking his lisinopril in about one week. He denies chest pain, dizziness, numbness, headaches.   BP Readings from Last 3 Encounters:  03/25/22 (!) 152/88  07/20/21 130/82  04/16/21 140/88     3) Hyperlipidemia: Currently managed on pravastatin 40 mg daily.  He is due for repeat lipid panel today. He ran out of his pravastatin a few weeks ago. He has yet to pick up his prescription from the pharmacy.     Review of Systems  Respiratory:  Negative for shortness of breath.   Cardiovascular:  Negative for chest pain.  Neurological:  Negative for dizziness, numbness and headaches.         Past Medical History:  Diagnosis Date   Cellulitis 02/21/2018   Concussion syndrome    Diabetes ([place])    Frequent headaches    Hypertension     Social History   Socioeconomic History   Marital status: Divorced    Spouse name: Not on file    Number of children: Not on file   Years of education: Not on file   Highest education level: Not on file  Occupational History   Not on file  Tobacco Use   Smoking status: Former    Packs/day: 0.50    Years: 30.00    Total pack years: 15.00    Types: Cigarettes    Quit date: 10/2018    Years since quitting: 3.4   Smokeless tobacco: Never  Vaping Use   Vaping Use: Never used  Substance and Sexual Activity   Alcohol use: Yes    Alcohol/week: 6.0 standard drinks of alcohol    Types: 6 Cans of beer per week   Drug use: No   Sexual activity: Not on file  Other Topics Concern   Not on file  Social History Narrative   Divorced.   2 children, 1 grandchild.   Retired. Once worked as a Catering manager.    Enjoys relaxing.    Social Determinants of Health   Financial Resource Strain: Not on file  Food Insecurity: Not on file  Transportation Needs: Not on file  Physical Activity: Not on file  Stress: Not on file  Social Connections: Not on file  Intimate Partner Violence: Not on file    Past Surgical History:  Procedure Laterality Date   APPENDECTOMY  1960  COLONOSCOPY     WISDOM TOOTH EXTRACTION      Family History  Problem Relation Age of Onset   Diabetes Father    Diabetes Paternal Grandfather    Diabetes Paternal Grandmother    Colon cancer Neg Hx    Crohn's disease Neg Hx    Rectal cancer Neg Hx    Stomach cancer Neg Hx    Esophageal cancer Neg Hx     No Known Allergies  Current Outpatient Medications on File Prior to Visit  Medication Sig Dispense Refill   Blood Glucose Monitoring Suppl (ACCU-CHEK AVIVA PLUS) w/Device KIT Check blood sugar before breakfast, lunch and bedtime and as directed. Dx E11.65 1 kit 0   glucose blood (ACCU-CHEK AVIVA PLUS) test strip Check blood sugar before breakfast, lunch and bedtime and as directed. Dx E11.65 300 each 1   Insulin NPH Isophane & Regular (RELION 70/30 [place]) Inject 15 Units into the skin daily before breakfast.      Lancets (ACCU-CHEK SOFT TOUCH) lancets Check blood sugar before breakfast, lunch and bedtime and as directed. Dx E11.65 300 each 1   lisinopril (ZESTRIL) 10 MG tablet Take 10 mg by mouth daily.     pravastatin (PRAVACHOL) 40 MG tablet Take 1 tablet (40 mg total) by mouth daily. For cholesterol. 90 tablet 3   aspirin EC 81 MG tablet Take 81 mg by mouth daily. (Patient not taking: Reported on 03/25/2022)     meloxicam (MOBIC) 7.5 MG tablet Take 7.5 mg by mouth 2 (two) times daily as needed. (Patient not taking: Reported on 03/25/2022)     No current facility-administered medications on file prior to visit.    BP (!) 152/88   Pulse 85   Temp (!) 97.3 F (36.3 C) (Temporal)   Ht '6\' 1"'  (1.854 m)   Wt 190 lb (86.2 kg)   SpO2 99%   BMI 25.07 kg/m  Objective:   Physical Exam Cardiovascular:     Rate and Rhythm: Normal rate and regular rhythm.  Pulmonary:     Effort: Pulmonary effort is normal.     Breath sounds: Normal breath sounds. No wheezing or rales.  Abdominal:     General: Abdomen is flat.     Palpations: Abdomen is soft.     Tenderness: There is no abdominal tenderness.  Musculoskeletal:     Cervical back: Neck supple.  Skin:    General: Skin is warm and dry.  Neurological:     Mental Status: He is alert and oriented to person, place, and time.  Psychiatric:        Mood and Affect: Mood normal.           Assessment & Plan:   Problem List Items Addressed This Visit       Cardiovascular and Mediastinum   Essential hypertension    Above goal today, also on recheck. Has not taken lisinopril in several weeks.  Discussed to resume lisinopril 10 mg daily.  CMP pending.        Endocrine   Type 2 diabetes mellitus with hyperglycemia (HCC) - Primary    No use of insulin in weeks.  Repeat A1C pending. Consider oral agent if A1C above goal for age.  Continue statin. Urine microalbumin ordered and pending.  Follow up in 3-6 months depending on A1C  result.      Relevant Orders   Hemoglobin A1c   Microalbumin / creatinine urine ratio     Genitourinary   CKD (chronic kidney disease)  stage 3, GFR 30-59 ml/min (HCC)    Strongly recommended he resume ACE-I for BP control and renal protection.  CMP pending.        Other   Hyperlipidemia    Recommended he resume pravastatin 40 mg. He will pick up refill from pharmacy.  Repeat lipid panel pending today      Relevant Orders   Lipid panel   Comprehensive metabolic panel   Other Visit Diagnoses     Screening for prostate cancer       Relevant Orders   PSA, Medicare          [name], NP

## 2022-03-25 NOTE — Assessment & Plan Note (Signed)
Strongly recommended he resume ACE-I for BP control and renal protection.  CMP pending.

## 2022-03-28 ENCOUNTER — Other Ambulatory Visit: Payer: Self-pay | Admitting: Primary Care

## 2022-03-28 DIAGNOSIS — E1165 Type 2 diabetes mellitus with hyperglycemia: Secondary | ICD-10-CM

## 2022-03-28 MED ORDER — GLIPIZIDE ER 5 MG PO TB24: 5.0000 mg | ORAL_TABLET | Freq: Every day | ORAL | 1 refills | Status: DC

## 2022-03-31 ENCOUNTER — Telehealth: Payer: Self-pay | Admitting: Primary Care

## 2022-03-31 NOTE — Telephone Encounter (Signed)
Left message for patient to call back and schedule Medicare Annual Wellness Visit (AWV) either virtually or phone  . Left  my jabber number [phone]   Last AWV  10/25/18    45 min for awv-i and in office appointments 30 min for awv-s  phone/virtual appointments

## 2022-06-15 ENCOUNTER — Other Ambulatory Visit: Payer: Self-pay | Admitting: Primary Care

## 2022-06-15 DIAGNOSIS — E1165 Type 2 diabetes mellitus with hyperglycemia: Secondary | ICD-10-CM

## 2022-06-28 ENCOUNTER — Telehealth: Payer: Self-pay | Admitting: Primary Care

## 2022-06-28 NOTE — Telephone Encounter (Signed)
LVM for pt to rtn my call to schedule AWV with [facility] call back # [phone] 

## 2022-07-01 ENCOUNTER — Other Ambulatory Visit: Payer: Medicare HMO

## 2022-08-17 NOTE — Progress Notes (Signed)
Jonathan Patella T. Lamondre Wesche, MD, [place] [facility] [place], [address]  Phone: [phone]  FAX: [phone]  Jonathan Page - 72 y.o. male  MRN [id number]  Date of Birth: November 25, 1950  Date: 08/18/2022  PCP: [name], NP  Referral: [name], NP  Chief Complaint  Patient presents with   Cough   Fatigue        Loss after Taste    Symptoms started last week   Subjective:   Jonathan Page is a 72 y.o. very pleasant male patient with Body mass index is 25.51 kg/m. who presents with the following:  72 year old patient presents with some flu symptoms.  He has cough, fatigue, and achiness.  Thought he was having allergies last week.  Has been restless and uable to sleep.   From Wed and did not sleep for several days.  No fever.  Has been warm at time.  Had 6 people out at [employer], where he works.  Has been feeling really sluggish.     Review of Systems is noted in the HPI, as appropriate  Objective:   BP (!) 152/86   Pulse 96   Temp 97.6 F (36.4 C) (Temporal)   Ht '6\' 1"'$  (1.854 m)   Wt 193 lb 6 oz (87.7 kg)   SpO2 100%   BMI 25.51 kg/m    Gen: WDWN, NAD. Globally Non-toxic HEENT: Throat clear, w/o exudate, R TM clear, L TM - good landmarks, No fluid present. rhinnorhea.  MMM Frontal sinuses: NT Max sinuses: NT NECK: Anterior cervical  LAD is absent CV: RRR, No M/G/R, cap refill <2 sec PULM: Breathing comfortably in no respiratory distress. no wheezing, crackles, rhonchi   Laboratory and Imaging Data: Results for orders placed or performed in visit on 08/18/22  POC COVID-19  Result Value Ref Range   SARS Coronavirus 2 Ag Negative Negative  POC Influenza A&B (Binax test)  Result Value Ref Range   Influenza A, POC Negative Negative   Influenza B, POC Negative Negative     Assessment and Plan:     ICD-10-CM   1. Viral syndrome  B34.9     2. Acute cough  R05.1 POC  COVID-19    POC Influenza A&B (Binax test)     The patient has a viral URI, and I am going anticipate that he will do okay given additional time.  Continue with supportive care.  Orders placed today for conditions managed today: Orders Placed This Encounter  Procedures   POC COVID-19   POC Influenza A&B (Binax test)    Disposition: No follow-ups on file.  Dragon [employer] One speech-to-text software was used for transcription in this dictation.  Possible transcriptional errors can occur using Editor, commissioning.   Signed,  Maud Deed. Rhonda Linan, MD   Outpatient Encounter Medications as of 08/18/2022  Medication Sig   aspirin EC 81 MG tablet Take 81 mg by mouth daily.   Blood Glucose Monitoring Suppl (ACCU-CHEK AVIVA PLUS) w/Device KIT Check blood sugar before breakfast, lunch and bedtime and as directed. Dx E11.65   glipiZIDE (GLUCOTROL XL) 5 MG 24 hr tablet Take 1 tablet (5 mg total) by mouth daily with breakfast. for diabetes.   glucose blood (ACCU-CHEK AVIVA PLUS) test strip Check blood sugar before breakfast, lunch and bedtime and as directed. Dx E11.65   Lancets (ACCU-CHEK SOFT TOUCH) lancets Check blood sugar before breakfast, lunch and bedtime and as directed. Dx  E11.65   lisinopril (ZESTRIL) 10 MG tablet Take 10 mg by mouth daily.   pravastatin (PRAVACHOL) 40 MG tablet Take 1 tablet (40 mg total) by mouth daily. For cholesterol.   [DISCONTINUED] meloxicam (MOBIC) 7.5 MG tablet Take 7.5 mg by mouth 2 (two) times daily as needed. (Patient not taking: Reported on 03/25/2022)   No facility-administered encounter medications on file as of 08/18/2022.

## 2022-08-18 ENCOUNTER — Ambulatory Visit (INDEPENDENT_AMBULATORY_CARE_PROVIDER_SITE_OTHER): Payer: Medicare HMO | Admitting: Family Medicine

## 2022-08-18 ENCOUNTER — Encounter: Payer: Self-pay | Admitting: Family Medicine

## 2022-08-18 VITALS — BP 152/86 | HR 96 | Temp 97.6°F | Ht 73.0 in | Wt 193.4 lb

## 2022-08-18 DIAGNOSIS — R051 Acute cough: Secondary | ICD-10-CM

## 2022-08-18 DIAGNOSIS — B349 Viral infection, unspecified: Secondary | ICD-10-CM | POA: Diagnosis not present

## 2022-08-18 LAB — POC COVID19 BINAXNOW: SARS Coronavirus 2 Ag: NEGATIVE

## 2022-08-18 LAB — POC INFLUENZA A&B (BINAX/QUICKVUE)
Influenza A, POC: NEGATIVE
Influenza B, POC: NEGATIVE

## 2022-08-19 ENCOUNTER — Encounter: Payer: Self-pay | Admitting: Family Medicine

## 2022-09-27 ENCOUNTER — Ambulatory Visit: Payer: Medicare HMO | Admitting: Primary Care

## 2022-10-28 ENCOUNTER — Encounter: Payer: Self-pay | Admitting: Internal Medicine

## 2022-10-28 ENCOUNTER — Ambulatory Visit (INDEPENDENT_AMBULATORY_CARE_PROVIDER_SITE_OTHER): Payer: Medicare HMO | Admitting: Internal Medicine

## 2022-10-28 VITALS — BP 124/84 | HR 90 | Temp 98.2°F | Ht 73.0 in | Wt 186.2 lb

## 2022-10-28 DIAGNOSIS — Z794 Long term (current) use of insulin: Secondary | ICD-10-CM | POA: Diagnosis not present

## 2022-10-28 DIAGNOSIS — E1165 Type 2 diabetes mellitus with hyperglycemia: Secondary | ICD-10-CM | POA: Diagnosis not present

## 2022-10-28 DIAGNOSIS — M79605 Pain in left leg: Secondary | ICD-10-CM

## 2022-10-28 MED ORDER — LISINOPRIL 10 MG PO TABS: 10.0000 mg | ORAL_TABLET | Freq: Every day | ORAL | 1 refills | Status: DC

## 2022-10-28 MED ORDER — GLIPIZIDE ER 5 MG PO TB24: 5.0000 mg | ORAL_TABLET | Freq: Every day | ORAL | 1 refills | Status: DC

## 2022-10-28 NOTE — Progress Notes (Signed)
Subjective:    Patient ID: Jonathan Page, male    DOB: Apr 04, 1951, 72 y.o.   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     Blood Glucose Monitoring Suppl (ACCU-CHEK AVIVA PLUS) w/Device KIT Check blood sugar before breakfast, lunch and bedtime and as directed. Dx E11.65 1 kit 0   glipiZIDE (GLUCOTROL XL) 5 MG 24 hr tablet Take 1 tablet (5 mg total) by mouth daily with breakfast. for diabetes. 90 tablet 1   glucose blood (ACCU-CHEK AVIVA PLUS) test strip Check blood sugar before breakfast, lunch and bedtime and as directed. Dx E11.65 300 each 1   Lancets (ACCU-CHEK SOFT TOUCH) lancets Check blood sugar before breakfast, lunch and bedtime and as directed. Dx E11.65 300 each 1   lisinopril (ZESTRIL) 10 MG tablet Take 10 mg by mouth daily.     pravastatin (PRAVACHOL) 40 MG tablet Take 1 tablet (40 mg total) by mouth daily. For cholesterol. 90 tablet 3   No current facility-administered medications on file prior to visit.    No Known Allergies  Past Medical History:  Diagnosis Date   Cellulitis 02/21/2018   Concussion syndrome    Diabetes (HCC)    Frequent headaches    Hypertension     Past Surgical History:  Procedure Laterality Date   APPENDECTOMY  1960    COLONOSCOPY     WISDOM TOOTH EXTRACTION      Family History  Problem Relation Age of Onset   Diabetes Father    Diabetes Paternal Grandfather    Diabetes Paternal Grandmother    Colon cancer Neg Hx    Crohn's disease Neg Hx    Rectal cancer Neg Hx    Stomach cancer Neg Hx    Esophageal cancer Neg Hx     Social History   Socioeconomic History   Marital status: Divorced    Spouse name: Not on file   Number of children: Not on file   Years of education: Not on file   Highest education level: Not on file  Occupational History   Not on file  Tobacco Use   Smoking status: Some Days    Packs/day: 0.50    Years: 30.00    Additional pack years: 0.00    Total pack years: 15.00    Types: Cigarettes, Cigars    Last attempt to quit: 10/2018    Years since quitting: 4.0   Smokeless tobacco: Never  Vaping Use   Vaping Use: Never used  Substance and Sexual Activity   Alcohol use: Yes    Alcohol/week: 6.0 standard drinks of alcohol    Types: 6 Cans of beer per week   Drug use: No   Sexual activity: Not on file  Other  Topics Concern   Not on file  Social History Narrative   Divorced.   2 children, 1 grandchild.   Retired. Once worked as a Financial controller.    Enjoys relaxing.    Social Determinants of Health   Financial Resource Strain: Not on file  Food Insecurity: Not on file  Transportation Needs: Not on file  Physical Activity: Not on file  Stress: Not on file  Social Connections: Not on file  Intimate Partner Violence: Not on file   Review of Systems No past problems with knee     Objective:   Physical Exam Musculoskeletal:     Comments: No effusion in left knee. No meniscus or ligament findings. Bursa slightly puffy but not tender and the same as on right Left hip has mildly decreased internal rotation  Neurological:     Comments: Normal gait No leg weakness            Assessment & Plan:

## 2022-10-28 NOTE — Assessment & Plan Note (Signed)
Seems to have had strain 3 weeks ago No major injury May have element of early hip OA Okay to walk/bike/elliptical at the gym Should work on core and resistance once pain gone Tylenol/heat

## 2022-11-29 ENCOUNTER — Ambulatory Visit: Payer: Medicare HMO | Admitting: Primary Care

## 2022-12-21 ENCOUNTER — Ambulatory Visit (INDEPENDENT_AMBULATORY_CARE_PROVIDER_SITE_OTHER): Payer: Medicare HMO | Admitting: Primary Care

## 2022-12-21 ENCOUNTER — Encounter: Payer: Self-pay | Admitting: Primary Care

## 2022-12-21 VITALS — BP 138/82 | HR 100 | Temp 97.5°F | Ht 73.0 in | Wt 182.0 lb

## 2022-12-21 DIAGNOSIS — N1832 Chronic kidney disease, stage 3b: Secondary | ICD-10-CM

## 2022-12-21 DIAGNOSIS — Z7984 Long term (current) use of oral hypoglycemic drugs: Secondary | ICD-10-CM | POA: Diagnosis not present

## 2022-12-21 DIAGNOSIS — E785 Hyperlipidemia, unspecified: Secondary | ICD-10-CM | POA: Diagnosis not present

## 2022-12-21 DIAGNOSIS — Z125 Encounter for screening for malignant neoplasm of prostate: Secondary | ICD-10-CM | POA: Diagnosis not present

## 2022-12-21 DIAGNOSIS — I10 Essential (primary) hypertension: Secondary | ICD-10-CM

## 2022-12-21 DIAGNOSIS — Z0000 Encounter for general adult medical examination without abnormal findings: Secondary | ICD-10-CM | POA: Diagnosis not present

## 2022-12-21 DIAGNOSIS — Z122 Encounter for screening for malignant neoplasm of respiratory organs: Secondary | ICD-10-CM | POA: Diagnosis not present

## 2022-12-21 DIAGNOSIS — E1165 Type 2 diabetes mellitus with hyperglycemia: Secondary | ICD-10-CM

## 2022-12-21 DIAGNOSIS — E1122 Type 2 diabetes mellitus with diabetic chronic kidney disease: Secondary | ICD-10-CM | POA: Diagnosis not present

## 2022-12-21 NOTE — Assessment & Plan Note (Signed)
Continue lisinopril 10 mg daily.  BMP pending.   Discussed to avoid NSAID medications.

## 2022-12-21 NOTE — Patient Instructions (Addendum)
Stop by the lab prior to leaving today. I will notify you of your results once received.   Call to schedule your colonoscopy.  You will either be contacted via phone regarding your referral to lung cancer screening, or you may receive a letter on your MyChart portal from our referral team with instructions for scheduling an appointment. Please let us know if you have not been contacted by anyone within two weeks.  Please schedule a follow up visit for 6 months for a diabetes check.  It was a pleasure to see you today!

## 2022-12-21 NOTE — Assessment & Plan Note (Signed)
Overall controlled.  Continue lisinopril 10 mg daily. Repeat BMP pending.

## 2022-12-21 NOTE — Assessment & Plan Note (Addendum)
Repeat A1c pending today. Glucose readings ranging 80's-100's.  Continue glipizide XL 5 mg daily. Follow-up in 3 to 6 months based on A1c result.

## 2022-12-21 NOTE — Assessment & Plan Note (Signed)
Immunizations UTD. Colonoscopy due, he is aware and will schedule. PSA due and pending. Referral placed for lung can screening program to see if he qualifies.  Discussed the importance of a healthy diet and regular exercise in order for weight loss, and to reduce the risk of further co-morbidity.  Exam stable. Labs pending.  Follow up in 1 year for repeat physical.

## 2022-12-21 NOTE — Assessment & Plan Note (Signed)
Repeat lipid panel pending. Continue pravastatin 40 mg daily. 

## 2022-12-21 NOTE — Progress Notes (Signed)
Subjective:    Patient ID: Jonathan Page, male    DOB: [birth year], 72 y.o.   MRN: [id number]  Leg Pain     Jonathan Page is a very pleasant 72 y.o. male who presents today for complete physical and follow up of chronic conditions.   Immunizations: -Shingles: Completed Shingrix series -Pneumonia: Completed Prevnar 13 in 2018, Pneumovax 23 in 2015  Diet: Fair diet.  Exercise: Regular exercise at the gym   Eye exam: Completes annually  Dental exam: Completed 1 year ago   Colonoscopy: Completed in 2020, due in 2023 and never completed.  Lung Cancer Screening: Never completed. Smoker off and on for the last 20 years.   PSA: Due  BP Readings from Last 3 Encounters:  12/21/22 138/82  10/28/22 124/84  08/18/22 (!) 152/86          Review of Systems  Constitutional:  Negative for unexpected weight change.  HENT:  Negative for rhinorrhea.   Respiratory:  Negative for cough and shortness of breath.   Cardiovascular:  Negative for chest pain.  Gastrointestinal:  Negative for constipation and diarrhea.  Genitourinary:  Negative for difficulty urinating.  Musculoskeletal:  Positive for myalgias.  Skin:  Negative for rash.  Allergic/Immunologic: Negative for environmental allergies.  Neurological:  Negative for dizziness and headaches.  Psychiatric/Behavioral:  The patient is not nervous/anxious.          Past Medical History:  Diagnosis Date   Cellulitis 02/21/2018   Concussion syndrome    Diabetes (HCC)    Frequent headaches    Hypertension     Social History   Socioeconomic History   Marital status: Divorced    Spouse name: Not on file   Number of children: Not on file   Years of education: Not on file   Highest education level: Not on file  Occupational History   Not on file  Tobacco Use   Smoking status: Some Days    Packs/day: 0.50    Years: 30.00    Additional pack years: 0.00    Total pack years: 15.00    Types: Cigarettes, Cigars    Last  attempt to quit: 10/2018    Years since quitting: 4.1   Smokeless tobacco: Never  Vaping Use   Vaping Use: Never used  Substance and Sexual Activity   Alcohol use: Yes    Alcohol/week: 6.0 standard drinks of alcohol    Types: 6 Cans of beer per week   Drug use: No   Sexual activity: Not on file  Other Topics Concern   Not on file  Social History Narrative   Divorced.   2 children, 1 grandchild.   Retired. Once worked as a Financial controller.    Enjoys relaxing.    Social Determinants of Health   Financial Resource Strain: Not on file  Food Insecurity: Not on file  Transportation Needs: Not on file  Physical Activity: Not on file  Stress: Not on file  Social Connections: Not on file  Intimate Partner Violence: Not on file    Past Surgical History:  Procedure Laterality Date   APPENDECTOMY  1960   COLONOSCOPY     WISDOM TOOTH EXTRACTION      Family History  Problem Relation Age of Onset   Diabetes Father    Diabetes Paternal Grandfather    Diabetes Paternal Grandmother    Colon cancer Neg Hx    Crohn's disease Neg Hx    Rectal cancer Neg Hx  Stomach cancer Neg Hx    Esophageal cancer Neg Hx     No Known Allergies  Current Outpatient Medications on File Prior to Visit  Medication Sig Dispense Refill   aspirin EC 81 MG tablet Take 81 mg by mouth daily.     Blood Glucose Monitoring Suppl (ACCU-CHEK AVIVA PLUS) w/Device KIT Check blood sugar before breakfast, lunch and bedtime and as directed. Dx E11.65 1 kit 0   glipiZIDE (GLUCOTROL XL) 5 MG 24 hr tablet Take 1 tablet (5 mg total) by mouth daily with breakfast. for diabetes. 90 tablet 1   glucose blood (ACCU-CHEK AVIVA PLUS) test strip Check blood sugar before breakfast, lunch and bedtime and as directed. Dx E11.65 300 each 1   Lancets (ACCU-CHEK SOFT TOUCH) lancets Check blood sugar before breakfast, lunch and bedtime and as directed. Dx E11.65 300 each 1   lisinopril (ZESTRIL) 10 MG tablet Take 1 tablet (10 mg  total) by mouth daily. 90 tablet 1   pravastatin (PRAVACHOL) 40 MG tablet Take 1 tablet (40 mg total) by mouth daily. For cholesterol. 90 tablet 3   No current facility-administered medications on file prior to visit.    BP 138/82   Pulse 100   Temp (!) 97.5 F (36.4 C) (Temporal)   Ht 6\' 1"  (1.854 m)   Wt 182 lb (82.6 kg)   SpO2 97%   BMI 24.01 kg/m  Objective:   Physical Exam HENT:     Right Ear: Tympanic membrane and ear canal normal.     Left Ear: Tympanic membrane and ear canal normal.     Nose: Nose normal.     Right Sinus: No maxillary sinus tenderness or frontal sinus tenderness.     Left Sinus: No maxillary sinus tenderness or frontal sinus tenderness.  Eyes:     Conjunctiva/sclera: Conjunctivae normal.  Neck:     Thyroid: No thyromegaly.     Vascular: No carotid bruit.  Cardiovascular:     Rate and Rhythm: Normal rate and regular rhythm.     Heart sounds: Normal heart sounds.  Pulmonary:     Effort: Pulmonary effort is normal.     Breath sounds: Normal breath sounds. No wheezing or rales.  Abdominal:     General: Bowel sounds are normal.     Palpations: Abdomen is soft.     Tenderness: There is no abdominal tenderness.  Musculoskeletal:        General: Normal range of motion.     Cervical back: Neck supple.  Skin:    General: Skin is warm and dry.  Neurological:     Mental Status: He is alert and oriented to person, place, and time.     Cranial Nerves: No cranial nerve deficit.     Deep Tendon Reflexes: Reflexes are normal and symmetric.  Psychiatric:        Mood and Affect: Mood normal.           Assessment & Plan:  Preventative health care Assessment & Plan: Immunizations UTD. Colonoscopy due, he is aware and will schedule. PSA due and pending. Referral placed for lung can screening program to see if he qualifies.  Discussed the importance of a healthy diet and regular exercise in order for weight loss, and to reduce the risk of further  co-morbidity.  Exam stable. Labs pending.  Follow up in 1 year for repeat physical.    Type 2 diabetes mellitus with hyperglycemia, without long-term current use of insulin (HCC) Assessment & Plan:  Repeat A1c pending today. Glucose readings ranging 80's-100's.  Continue glipizide XL 5 mg daily. Follow-up in 3 to 6 months based on A1c result.  Orders: -     Hemoglobin A1c  Stage 3b chronic kidney disease (HCC) Assessment & Plan: Continue lisinopril 10 mg daily.  BMP pending.   Discussed to avoid NSAID medications.     Hyperlipidemia, unspecified hyperlipidemia type Assessment & Plan: Repeat lipid panel pending. Continue pravastatin 40 mg daily.  Orders: -     Lipid panel  Screening for lung cancer -     Ambulatory Referral for Lung Cancer Scre  Screening for prostate cancer -     PSA, Medicare  Essential hypertension Assessment & Plan: Overall controlled.  Continue lisinopril 10 mg daily. Repeat BMP pending.  Orders: -     Basic metabolic panel        Doreene Nest, NP

## 2022-12-22 LAB — LIPID PANEL
Cholesterol: 141 mg/dL (ref 0–200)
HDL: 45.6 mg/dL (ref >39.00)
LDL Cholesterol: 57 mg/dL (ref 0–99)
NonHDL: 95.32
Total CHOL/HDL Ratio: 3
Triglycerides: 193 mg/dL — ABNORMAL HIGH (ref 0.0–149.0)
VLDL: 38.6 mg/dL (ref 0.0–40.0)

## 2022-12-22 LAB — BASIC METABOLIC PANEL
BUN: 18 mg/dL (ref 6–23)
CO2: 26 mEq/L (ref 19–32)
Calcium: 9.9 mg/dL (ref 8.4–10.5)
Chloride: 105 mEq/L (ref 96–112)
Creatinine, Ser: 1.63 mg/dL — ABNORMAL HIGH (ref 0.40–1.50)
GFR: 42.1 mL/min — ABNORMAL LOW (ref >60.00)
Glucose, Bld: 201 mg/dL — ABNORMAL HIGH (ref 70–99)
Potassium: 4.5 mEq/L (ref 3.5–5.1)
Sodium: 139 mEq/L (ref 135–145)

## 2022-12-22 LAB — PSA, MEDICARE: PSA: 3.29 ng/ml (ref 0.10–4.00)

## 2022-12-22 LAB — HEMOGLOBIN A1C: Hgb A1c MFr Bld: 7.5 % — ABNORMAL HIGH (ref 4.6–6.5)

## 2022-12-26 ENCOUNTER — Other Ambulatory Visit: Payer: Self-pay | Admitting: Primary Care

## 2022-12-26 DIAGNOSIS — E1165 Type 2 diabetes mellitus with hyperglycemia: Secondary | ICD-10-CM

## 2022-12-26 MED ORDER — GLIPIZIDE ER 10 MG PO TB24
10.0000 mg | ORAL_TABLET | Freq: Every day | ORAL | 1 refills | Status: DC
Start: 2022-12-26 — End: 2023-05-29

## 2023-01-18 ENCOUNTER — Encounter: Payer: Self-pay | Admitting: Internal Medicine

## 2023-01-20 ENCOUNTER — Ambulatory Visit (INDEPENDENT_AMBULATORY_CARE_PROVIDER_SITE_OTHER): Payer: Medicare HMO | Admitting: Nurse Practitioner

## 2023-01-20 ENCOUNTER — Encounter: Payer: Self-pay | Admitting: Nurse Practitioner

## 2023-01-20 VITALS — BP 118/80 | HR 94 | Temp 97.5°F | Ht 73.0 in | Wt 175.6 lb

## 2023-01-20 DIAGNOSIS — R079 Chest pain, unspecified: Secondary | ICD-10-CM | POA: Diagnosis not present

## 2023-01-20 LAB — COMPREHENSIVE METABOLIC PANEL
ALT: 18 U/L (ref 0–53)
AST: 17 U/L (ref 0–37)
Albumin: 4.4 g/dL (ref 3.5–5.2)
Alkaline Phosphatase: 104 U/L (ref 39–117)
BUN: 25 mg/dL — ABNORMAL HIGH (ref 6–23)
CO2: 25 mEq/L (ref 19–32)
Calcium: 9.6 mg/dL (ref 8.4–10.5)
Chloride: 101 mEq/L (ref 96–112)
Creatinine, Ser: 2.02 mg/dL — ABNORMAL HIGH (ref 0.40–1.50)
GFR: 32.52 mL/min — ABNORMAL LOW (ref >60.00)
Glucose, Bld: 250 mg/dL — ABNORMAL HIGH (ref 70–99)
Potassium: 4.4 mEq/L (ref 3.5–5.1)
Sodium: 135 mEq/L (ref 135–145)
Total Bilirubin: 0.6 mg/dL (ref 0.2–1.2)
Total Protein: 7 g/dL (ref 6.0–8.3)

## 2023-01-20 LAB — CBC
HCT: 43 % (ref 39.0–52.0)
Hemoglobin: 13.8 g/dL (ref 13.0–17.0)
MCHC: 32.2 g/dL (ref 30.0–36.0)
MCV: 93.1 fl (ref 78.0–100.0)
Platelets: 168 10*3/uL (ref 150.0–400.0)
RBC: 4.61 Mil/uL (ref 4.22–5.81)
RDW: 14.6 % (ref 11.5–15.5)
WBC: 4.7 10*3/uL (ref 4.0–10.5)

## 2023-01-20 LAB — SEDIMENTATION RATE: Sed Rate: 29 mm/hr — ABNORMAL HIGH (ref 0–20)

## 2023-01-20 LAB — HIGH SENSITIVITY CRP: CRP, High Sensitivity: 18.25 mg/L — ABNORMAL HIGH (ref 0.000–5.000)

## 2023-01-20 NOTE — Progress Notes (Signed)
Acute Office Visit  Subjective:     Patient ID: Jonathan Page, male    DOB: [birth year], 72 y.o.   MRN: [id number]  Chief Complaint  Patient presents with   Chest Pain    Pt states of stress pain in chest started 3-4 weeks ago. Pt states pain is on both sides of ribcage, no shortness of breath,      Patient is in today for chest pain with a history of HTn, DM2, CKDm Prolonged QT, HLD  Patient is not followed by cariology  No cardiac family history  Patient does take baby ASA daily  No history of blood clots. No exogenous hormone use, no personal DVT/PE, no recent long travel   States that he thinks he has sneezed and caught himself  State that it was both sides near the sternum. States that worse with movement and positions changes. States at rest he is ok No shob sweating nausea or vomiting  Sttes that he does work at [employer] and he felt lightheaded  States the discomfort has been 2.5 -3 weeks. States that he recently started [facility]in [employer] [facility] [employer] appro[facility] 2-3 months ago. No trouble with chest pain and doing cardio. Last work out was 2 weeks ago. He did recently add in weights. He is alos under stress with family and mother having dementia  Review of Systems  Constitutional:  Negative for chills and fever.  Respiratory:  Negative for shortness of breath.   Cardiovascular:  Positive for chest pain.  Neurological:  Negative for headaches.  Psychiatric/Behavioral:  Negative for hallucinations and suicidal ideas.         Objective:    BP 118/80   Pulse 94   Temp (!) 97.5 F (36.4 C) (Temporal)   Ht 6\' 1"  (1.854 m)   Wt 175 lb 9.6 oz (79.7 kg)   SpO2 93%   BMI 23.17 kg/m  BP Readings from Last 3 Encounters:  01/20/23 118/80  12/21/22 138/82  10/28/22 124/84   Wt Readings from Last 3 Encounters:  01/20/23 175 lb 9.6 oz (79.7 kg)  12/21/22 182 lb (82.6 kg)  10/28/22 186 lb 3.2 oz (84.5 kg)      Physical Exam Vitals and nursing note reviewed.  Constitutional:       Appearance: Normal appearance.  Cardiovascular:     Rate and Rhythm: Normal rate and regular rhythm.     Heart sounds: Normal heart sounds.     Comments: When patient laid flat it caused it and then it abated  Pulmonary:     Effort: Pulmonary effort is normal.     Breath sounds: Normal breath sounds.  Chest:     Chest wall: No tenderness.  Abdominal:     General: Bowel sounds are normal. There is no distension.     Palpations: There is no mass.     Tenderness: There is no abdominal tenderness.     Hernia: No hernia is present.  Neurological:     Mental Status: He is alert.     No results found for any visits on 01/20/23.      Assessment & Plan:   Problem List Items Addressed This Visit       Other   Chest pain - Primary    Atypical in nature.  Do think more musculoskeletal related.  EKG within normal limits for patient.  Given sometimes with lying down causing it we will check sed rate and high-sensitivity CRP for pericarditis.  Signs and  symptoms reviewed when to seek urgent emergent healthcare.  Pending labs today      Relevant Orders   EKG 12-Lead (Completed)   CBC   Comprehensive metabolic panel   Sedimentation rate   High sensitivity CRP    No orders of the defined types were placed in this encounter.   Return if symptoms worsen or fail to improve.  Audria Nine, NP

## 2023-01-20 NOTE — Patient Instructions (Addendum)
Nice to see you today EKG looked ok for you If the symptoms change be evaluated over the weekend When you are doing weight be mindful and careful of how you are lifting Follow up if you do not improve

## 2023-01-20 NOTE — Assessment & Plan Note (Signed)
Atypical in nature.  Do think more musculoskeletal related.  EKG within normal limits for patient.  Given sometimes with lying down causing it we will check sed rate and high-sensitivity CRP for pericarditis.  Signs and symptoms reviewed when to seek urgent emergent healthcare.  Pending labs today

## 2023-01-24 ENCOUNTER — Other Ambulatory Visit: Payer: Self-pay | Admitting: Primary Care

## 2023-01-24 DIAGNOSIS — N1832 Chronic kidney disease, stage 3b: Secondary | ICD-10-CM

## 2023-01-30 ENCOUNTER — Other Ambulatory Visit: Payer: Self-pay | Admitting: Primary Care

## 2023-02-08 ENCOUNTER — Other Ambulatory Visit (INDEPENDENT_AMBULATORY_CARE_PROVIDER_SITE_OTHER): Payer: Medicare HMO

## 2023-02-08 DIAGNOSIS — N1832 Chronic kidney disease, stage 3b: Secondary | ICD-10-CM

## 2023-02-08 LAB — BASIC METABOLIC PANEL
BUN: 21 mg/dL (ref 6–23)
CO2: 27 mEq/L (ref 19–32)
Calcium: 9.5 mg/dL (ref 8.4–10.5)
Chloride: 105 mEq/L (ref 96–112)
Creatinine, Ser: 1.58 mg/dL — ABNORMAL HIGH (ref 0.40–1.50)
GFR: 43.66 mL/min — ABNORMAL LOW (ref >60.00)
Glucose, Bld: 73 mg/dL (ref 70–99)
Potassium: 4.1 mEq/L (ref 3.5–5.1)
Sodium: 139 mEq/L (ref 135–145)

## 2023-03-16 ENCOUNTER — Ambulatory Visit (INDEPENDENT_AMBULATORY_CARE_PROVIDER_SITE_OTHER): Payer: Medicare HMO | Admitting: Family Medicine

## 2023-03-16 ENCOUNTER — Encounter: Payer: Self-pay | Admitting: Family Medicine

## 2023-03-16 VITALS — BP 134/80 | HR 86 | Temp 98.0°F | Ht 73.0 in | Wt 173.5 lb

## 2023-03-16 DIAGNOSIS — M79605 Pain in left leg: Secondary | ICD-10-CM | POA: Diagnosis not present

## 2023-03-16 DIAGNOSIS — R29898 Other symptoms and signs involving the musculoskeletal system: Secondary | ICD-10-CM | POA: Diagnosis not present

## 2023-03-16 DIAGNOSIS — M79604 Pain in right leg: Secondary | ICD-10-CM | POA: Diagnosis not present

## 2023-03-16 NOTE — Progress Notes (Signed)
Jonathan Fedie T. Jonathan Jarchow, MD, CAQ Sports Medicine [facility] [address] [place], [address]  Phone: [phone]  FAX: [phone]  Jonathan Page - 72 y.o. male  MRN [id number]  Date of Birth: 1950/07/12  Date: 03/16/2023  PCP: Doreene Nest, NP  Referral: Doreene Nest, NP  Chief Complaint  Patient presents with   Leg Pain    C/o B leg pain due a fall in 08/2022.    Subjective:   Jonathan Page is a 72 y.o. very pleasant male patient with Body mass index is 22.89 kg/m. who presents with the following:  He is a very pleasant gentleman that I am seeing for the first time in regards to this condition.  He had a fall and April was 2024 when he was a bowling and lost his balance.  He subsequently fell and since then he has had some pain in the bilateral thighs.  Pain really is been including the hip the thigh and the knee off and on for months now.  He has pain in the anterior groin as well as the knee joint.  He has not had any kind of significant effusion. No significant history of prior knee surgery, major injury, or major injury to the hip joint.  Pain is limiting him quite a bit right now including his ability to exercise, which has diminished quite a bit.  B leg pain, fall 08/2022.  [place] at the bowling alley.   First ball, went down the alley, and lost his balance and tried to  Fall  Taking bid tylenol.   No back pain.     Review of Systems is noted in the HPI, as appropriate  Objective:   BP 134/80   Pulse 86   Temp 98 F (36.7 C) (Oral)   Ht 6\' 1"  (1.854 m)   Wt 173 lb 8 oz (78.7 kg)   SpO2 98%   BMI 22.89 kg/m   GEN: No acute distress; alert,appropriate. PULM: Breathing comfortably in no respiratory distress PSYCH: Normally interactive.    HIP EXAM: SIDE: B ROM: Abduction, Flexion, Internal and External range of motion: Minimal limitation in abduction.  With the hip flexed to 90 degrees he only has  a total of 25 degrees of total rotary movement.  He does have some pain in terminal motion, relatively mild and present with internal and external range of motion. Pain with terminal IROM and EROM above GTB: NT SLR: NEG Knees: No effusion FABER: NT REVERSE FABER: NT, neg Piriformis: NT at direct palpation Str: flexion: 3-/5 abduction: 3/5 adduction: 3/5 Strength testing non-tender    Laboratory and Imaging Data:  Assessment and Plan:     ICD-10-CM   1. Bilateral leg pain  M79.604 Ambulatory referral to Physical Therapy   M79.605     2. Bilateral leg weakness  R29.898 Ambulatory referral to Physical Therapy    3. Weakness of both hips  R29.898 Ambulatory referral to Physical Therapy     Acute on chronic bilateral leg pain with exacerbation.  He does also have some ongoing hip pain as well as knee pain.,  But I suspect that this is all from his fall that he had several months ago.  His leg and hip musculature has gotten quite weak, which I think is having a significant impact to his pain levels.  Pain is really more in the thigh, and I think that the thigh pain and weakness is feeding off  and contributing to both the hip and knee pain.  I do not think he is going to have a good resolution of symptoms until he can get his legs and hips up to approaching normal strength.  I did give him a program from the [employer] [facility] for home rehab.  Will also consult physical therapy for their assistance.  Social: This is significantly limiting his ability to exercise.  Medication Management during today's office visit: No orders of the defined types were placed in this encounter.  There are no discontinued medications.  Orders placed today for conditions managed today: Orders Placed This Encounter  Procedures   Ambulatory referral to Physical Therapy    Disposition: 6 to 8 weeks  [employer] One speech-to-text software was used for transcription in this  dictation.  Possible transcriptional errors can occur using Animal nutritionist.   Signed,  Elpidio Galea. Mousa Prout, MD   Outpatient Encounter Medications as of 03/16/2023  Medication Sig   aspirin EC 81 MG tablet Take 81 mg by mouth daily.   Blood Glucose Monitoring Suppl (ACCU-CHEK AVIVA PLUS) w/Device KIT Check blood sugar before breakfast, lunch and bedtime and as directed. Dx E11.65   glipiZIDE (GLUCOTROL XL) 10 MG 24 hr tablet Take 1 tablet (10 mg total) by mouth daily with breakfast. for diabetes.   glucose blood (ACCU-CHEK AVIVA PLUS) test strip Check blood sugar before breakfast, lunch and bedtime and as directed. Dx E11.65   Lancets (ACCU-CHEK SOFT TOUCH) lancets Check blood sugar before breakfast, lunch and bedtime and as directed. Dx E11.65   lisinopril (ZESTRIL) 10 MG tablet Take 1 tablet (10 mg total) by mouth daily.   pravastatin (PRAVACHOL) 40 MG tablet Take 1 tablet (40 mg total) by mouth daily. For cholesterol.   No facility-administered encounter medications on file as of 03/16/2023.

## 2023-03-29 ENCOUNTER — Other Ambulatory Visit: Payer: Medicare HMO

## 2023-03-31 ENCOUNTER — Ambulatory Visit: Payer: Medicare HMO

## 2023-03-31 ENCOUNTER — Telehealth: Payer: Self-pay

## 2023-03-31 NOTE — Telephone Encounter (Signed)
Patient did not call back and reschedule pre visit so PV and colonoscopy were cancelled and No Show letter sent in My Chart and mailed.

## 2023-03-31 NOTE — Telephone Encounter (Signed)
Left voice mail that the patient had missed his Pre visit with the nurse and that he needed to call back by 5pm today to reschedule the pv or his colonoscopy on 11/07 would be cancelled.

## 2023-03-31 NOTE — Telephone Encounter (Signed)
Called patient for pre visit.  Left message that I would call back in 5 minutes to try and reach him

## 2023-04-04 ENCOUNTER — Encounter: Payer: Self-pay | Admitting: Internal Medicine

## 2023-04-17 ENCOUNTER — Telehealth: Payer: Self-pay | Admitting: Primary Care

## 2023-04-17 DIAGNOSIS — R29898 Other symptoms and signs involving the musculoskeletal system: Secondary | ICD-10-CM

## 2023-04-17 DIAGNOSIS — M79604 Pain in right leg: Secondary | ICD-10-CM

## 2023-04-17 NOTE — Telephone Encounter (Signed)
Jonathan Hampshire, do you agree? Just wanted to make sure.

## 2023-04-17 NOTE — Telephone Encounter (Signed)
Pt called requesting a referral to orthopedic? Pt states he saw Dr. Patsy Lager on 03/16/23 for leg pain. Pt states he is now having shoulder, leg & back pain. Pt states this morning, 11/4, it was very hard for him to get out of bed. Pt states he was barely able to pull himself up. Pt mentioned during visit with Dr. Patsy Lager, he was denied any injections & suggested to exercise more instead. Pt states due the pain, he wouldn't be able to exercise. Pt preferred location is [place]. Call back # [phone].

## 2023-04-18 NOTE — Addendum Note (Signed)
Addended by: Doreene Nest on: 04/18/2023 01:45 PM   Modules accepted: Orders

## 2023-04-18 NOTE — Telephone Encounter (Signed)
Called patient reviewed all information and repeated back to me. Will call if any questions.  ? ?

## 2023-04-18 NOTE — Telephone Encounter (Signed)
Please notify patient that a referral was placed for orthopedics.  He will either receive a phone call or a letter on his MyChart portal in a couple of days with instructions on how to set up a visit.

## 2023-04-20 ENCOUNTER — Emergency Department (HOSPITAL_COMMUNITY)
Admission: EM | Admit: 2023-04-20 | Discharge: 2023-04-20 | Disposition: A | Discharge status: Home / Self Care | Payer: Medicare HMO | Attending: Emergency Medicine | Admitting: Emergency Medicine

## 2023-04-20 ENCOUNTER — Encounter: Payer: Medicare HMO | Admitting: Internal Medicine

## 2023-04-20 ENCOUNTER — Other Ambulatory Visit: Payer: Self-pay

## 2023-04-20 DIAGNOSIS — Z79899 Other long term (current) drug therapy: Secondary | ICD-10-CM | POA: Insufficient documentation

## 2023-04-20 DIAGNOSIS — N189 Chronic kidney disease, unspecified: Secondary | ICD-10-CM | POA: Insufficient documentation

## 2023-04-20 DIAGNOSIS — Z7984 Long term (current) use of oral hypoglycemic drugs: Secondary | ICD-10-CM | POA: Insufficient documentation

## 2023-04-20 DIAGNOSIS — M79604 Pain in right leg: Secondary | ICD-10-CM | POA: Diagnosis not present

## 2023-04-20 DIAGNOSIS — K921 Melena: Secondary | ICD-10-CM | POA: Diagnosis not present

## 2023-04-20 DIAGNOSIS — I129 Hypertensive chronic kidney disease with stage 1 through stage 4 chronic kidney disease, or unspecified chronic kidney disease: Secondary | ICD-10-CM | POA: Diagnosis not present

## 2023-04-20 DIAGNOSIS — E119 Type 2 diabetes mellitus without complications: Secondary | ICD-10-CM | POA: Diagnosis not present

## 2023-04-20 DIAGNOSIS — M79605 Pain in left leg: Secondary | ICD-10-CM | POA: Insufficient documentation

## 2023-04-20 DIAGNOSIS — Z7982 Long term (current) use of aspirin: Secondary | ICD-10-CM | POA: Diagnosis not present

## 2023-04-20 LAB — CK: Total CK: 42 U/L — ABNORMAL LOW (ref 49–397)

## 2023-04-20 LAB — URINALYSIS, ROUTINE W REFLEX MICROSCOPIC
Bilirubin Urine: NEGATIVE
Glucose, UA: NEGATIVE mg/dL
Hgb urine dipstick: NEGATIVE
Ketones, ur: 5 mg/dL — AB
Leukocytes,Ua: NEGATIVE
Nitrite: NEGATIVE
Protein, ur: 30 mg/dL — AB
Specific Gravity, Urine: 1.019 (ref 1.005–1.030)
pH: 5 (ref 5.0–8.0)

## 2023-04-20 LAB — CBC
HCT: 42 % (ref 39.0–52.0)
Hemoglobin: 13.9 g/dL (ref 13.0–17.0)
MCH: 30.5 pg (ref 26.0–34.0)
MCHC: 33.1 g/dL (ref 30.0–36.0)
MCV: 92.1 fL (ref 80.0–100.0)
Platelets: 212 10*3/uL (ref 150–400)
RBC: 4.56 MIL/uL (ref 4.22–5.81)
RDW: 14.7 % (ref 11.5–15.5)
WBC: 8.3 10*3/uL (ref 4.0–10.5)
nRBC: 0 % (ref 0.0–0.2)

## 2023-04-20 LAB — BASIC METABOLIC PANEL
Anion gap: 10 (ref 5–15)
BUN: 16 mg/dL (ref 8–23)
CO2: 23 mmol/L (ref 22–32)
Calcium: 10.2 mg/dL (ref 8.9–10.3)
Chloride: 102 mmol/L (ref 98–111)
Creatinine, Ser: 1.36 mg/dL — ABNORMAL HIGH (ref 0.61–1.24)
GFR, Estimated: 56 mL/min — ABNORMAL LOW (ref >60)
Glucose, Bld: 190 mg/dL — ABNORMAL HIGH (ref 70–99)
Potassium: 3.9 mmol/L (ref 3.5–5.1)
Sodium: 135 mmol/L (ref 135–145)

## 2023-04-20 LAB — SEDIMENTATION RATE: Sed Rate: 28 mm/h — ABNORMAL HIGH (ref 0–16)

## 2023-04-20 MED ORDER — GABAPENTIN 100 MG PO CAPS: 100.0000 mg | ORAL_CAPSULE | Freq: Three times a day (TID) | ORAL | 0 refills | Status: DC

## 2023-04-20 NOTE — ED Provider Notes (Signed)
[place] EMERGENCY DEPARTMENT AT [facility] Provider Note   CSN: [id number] Arrival date & time: 04/20/23  1428     History  Chief Complaint  Patient presents with   Blood In Stools   Weakness    Jonathan Page is a 72 y.o. male.  HPI      72 year old male with a history of type 2 diabetes, hypertension, hyperlipidemia, CKD who presents with concern for episode of blood in stool Monday and today, also notes progressive proximal leg pain worse at night but now feeling weak walking over the last several months.    Rolled over and hurt arm when pushing self up last week August began having problems with legs, saw [facility] told it was arthritis, tried 500mg  tylenol, get 3-4 hours of sleep a night, laying down it is worse but standing up and walking around it is better Was trying PT and exercise Progressing with pain, difficulty walking Getting out of bed in middle of night to walk which is worse part Feeling weak walking, has lost muscle mass No numbness Lost weight 31lb over past 6 months or so, was watching what he ate, dieting, at first was trying to lose weight but then continued to lose No loss control of bowels or bladder No fevers, no nausea or vomiting Not on blood thinners  Smoking this is last pack, has reduced it, 1-2/day,  no hx of ivdu.    This morning noticed blood in stool, dark red. And Monday.  One episode each day.  No diarrhea. No abdominal pain.  No rectal pain.  No constipation.  No hx of bleeding in bowels, December has colonoscopy, has had one before. Not sure where previous was ([facility]) now scheduled [facility] appt in December   Falls in [facility] Medications Prior to Admission medications   Medication Sig Start Date End Date Taking? Authorizing Provider  acetaminophen (TYLENOL) 500 MG tablet Take 500 mg by mouth every 6 (six) hours as needed.   Yes [provider]  aspirin EC 81 MG tablet Take 81 mg by  mouth daily.   Yes [provider]  gabapentin (NEURONTIN) 100 MG capsule Take 1 capsule (100 mg total) by mouth 3 (three) times daily. 04/20/23 05/20/23 Yes Alvira Monday, MD  glipiZIDE (GLUCOTROL XL) 10 MG 24 hr tablet Take 1 tablet (10 mg total) by mouth daily with breakfast. for diabetes. 12/26/22  Yes Doreene Nest, NP  lisinopril (ZESTRIL) 10 MG tablet Take 1 tablet (10 mg total) by mouth daily. 10/28/22  Yes Karie Schwalbe, MD  Blood Glucose Monitoring Suppl (ACCU-CHEK AVIVA PLUS) w/Device KIT Check blood sugar before breakfast, lunch and bedtime and as directed. Dx E11.65 07/13/17   Doreene Nest, NP  glucose blood (ACCU-CHEK AVIVA PLUS) test strip Check blood sugar before breakfast, lunch and bedtime and as directed. Dx E11.65 07/13/17   Doreene Nest, NP  Lancets (ACCU-CHEK SOFT TOUCH) lancets Check blood sugar before breakfast, lunch and bedtime and as directed. Dx E11.65 07/13/17   Doreene Nest, NP  pravastatin (PRAVACHOL) 40 MG tablet Take 1 tablet (40 mg total) by mouth daily. For cholesterol. Patient not taking: Reported on 04/20/2023 07/20/21   Doreene Nest, NP      Allergies    Patient has no known allergies.    Review of Systems   Review of Systems  Physical Exam Updated Vital Signs BP (!) 147/102 (BP Location: Right Arm)  Pulse 95   Temp 97.7 F (36.5 C) (Oral)   Resp 12   Ht 6\' 1"  (1.854 m)   Wt 79.4 kg   SpO2 99%   BMI 23.09 kg/m  Physical Exam Vitals and nursing note reviewed.  Constitutional:      General: He is not in acute distress.    Appearance: He is well-developed. He is not diaphoretic.  HENT:     Head: Normocephalic and atraumatic.  Eyes:     Conjunctiva/sclera: Conjunctivae normal.  Cardiovascular:     Rate and Rhythm: Normal rate and regular rhythm.     Pulses: Normal pulses.  Pulmonary:     Effort: Pulmonary effort is normal. No respiratory distress.  Abdominal:     General: There is no distension.      Palpations: Abdomen is soft.     Tenderness: There is no abdominal tenderness. There is no right CVA tenderness, left CVA tenderness or guarding.  Musculoskeletal:        General: No tenderness (no lumbar tenderenss).     Cervical back: Normal range of motion.  Skin:    General: Skin is warm and dry.  Neurological:     Mental Status: He is alert and oriented to person, place, and time.     Comments: 5/5 lower extremity strength, normal sensation Difficulty obtaining patellar DTRs     ED Results / Procedures / Treatments   Labs (all labs ordered are listed, but only abnormal results are displayed) Labs Reviewed  BASIC METABOLIC PANEL - Abnormal; Notable for the following components:      Result Value   Glucose, Bld 190 (*)    Creatinine, Ser 1.36 (*)    GFR, Estimated 56 (*)    All other components within normal limits  URINALYSIS, ROUTINE W REFLEX MICROSCOPIC - Abnormal; Notable for the following components:   Ketones, ur 5 (*)    Protein, ur 30 (*)    Bacteria, UA RARE (*)    All other components within normal limits  CK - Abnormal; Notable for the following components:   Total CK 42 (*)    All other components within normal limits  SEDIMENTATION RATE - Abnormal; Notable for the following components:   Sed Rate 28 (*)    All other components within normal limits  CBC  POC OCCULT BLOOD, ED    EKG EKG Interpretation Date/Time:  Thursday April 20 2023 15:26:13 EST Ventricular Rate:  106 PR Interval:  188 QRS Duration:  126 QT Interval:  372 QTC Calculation: 494 R Axis:   269  Text Interpretation: Sinus tachycardia Right atrial enlargement Right bundle branch block Inferior infarct , age undetermined Anterior infarct , age undetermined Abnormal ECG When compared with ECG of 16-Oct-2016 11:02,  Since prior ECG< rate has increased Confirmed by Alvira Monday ([id number]) on 04/20/2023 7:28:04 PM  Radiology No results found.  Procedures Procedures    Medications  Ordered in ED Medications - No data to display  ED Course/ Medical Decision Making/ A&P                                    72 year old male with a history of type 2 diabetes, hypertension, hyperlipidemia, CKD who presents with concern for episode of blood in stool Monday and today, also notes progressive proximal leg pain worse at night but now feeling weak walking over the last several months.  Regarding blood in  stool--consider hemorrhoid bleed, other more proximal bleeding/avm/colitis/malignancy.  He is hemodynamically stable, brown colored stool on rectal exam, normal hgb and doubt emergent GI bleed to require admission--however in light of symptoms, weight loss, recommend close outpatient GI follow up.  Regarding leg pain--has been progressive over months--ddx includes PVD, venous stasis, spinal etiology, rhabdomyolysis, electrolyte abnormalities, RLS, PMR.    Labs completed and personally evaluated and interpreted by me show a creatinine improved from previous from 1.58-1.36 today, no clinically significant electrolyte abnormalities.  Hemoglobin is normal at 13.9.  Normal pulses bilaterally with no sign of acute arterial thrombus/need for emergent surgery. Does have risk factors for PVD and consider ischemic resting leg pain on ddx although has strong pulses and pain is more proximal in thighs--that being said-feel outpt ABIs should be considered.   Normal CK.  Normal strength, sensation, no signs of cauda equina, no fever or hx of IVDU an doubt epidural abscess//osteomyelitis-also is not having any back pain.  No abdominal or back pain and doubt ruptured AAA or dissection.   ESR only mildly elevated making PMR less likely.   Will start gabapentin, recommend continued outpatient evaluation of weight loss, leg pain, blood in stool with PCP and GI.  Patient discharged in stable condition with understanding of reasons to return.         Final Clinical Impression(s) / ED  Diagnoses Final diagnoses:  Bilateral leg pain  Blood in stool    Rx / DC Orders ED Discharge Orders          Ordered    gabapentin (NEURONTIN) 100 MG capsule  3 times daily        04/20/23 2233              Alvira Monday, MD 04/21/23 1259

## 2023-04-20 NOTE — ED Triage Notes (Signed)
Pt has been losing 30lbs since August, and had multiple falls. This week pt had dark red blood in stool. Pt states it was a small amount but this was the second time this week. Increased weakness.  Denies blood thinners or abd pain.

## 2023-04-20 NOTE — ED Notes (Signed)
Discharge instructions reviewed.   Opportunity for questions and concerns provided.   Newly prescribed medications discussed. Pharmacy verified.   Alert, oriented, ambulatory and displays no signs of distress.

## 2023-04-24 ENCOUNTER — Other Ambulatory Visit (INDEPENDENT_AMBULATORY_CARE_PROVIDER_SITE_OTHER): Payer: Self-pay

## 2023-04-24 ENCOUNTER — Ambulatory Visit (INDEPENDENT_AMBULATORY_CARE_PROVIDER_SITE_OTHER): Payer: Medicare HMO | Admitting: Physical Medicine and Rehabilitation

## 2023-04-24 ENCOUNTER — Encounter: Payer: Self-pay | Admitting: Physical Medicine and Rehabilitation

## 2023-04-24 DIAGNOSIS — G8929 Other chronic pain: Secondary | ICD-10-CM | POA: Diagnosis not present

## 2023-04-24 DIAGNOSIS — M5442 Lumbago with sciatica, left side: Secondary | ICD-10-CM

## 2023-04-24 DIAGNOSIS — R1032 Left lower quadrant pain: Secondary | ICD-10-CM

## 2023-04-24 DIAGNOSIS — M5416 Radiculopathy, lumbar region: Secondary | ICD-10-CM | POA: Diagnosis not present

## 2023-04-24 DIAGNOSIS — R1031 Right lower quadrant pain: Secondary | ICD-10-CM | POA: Diagnosis not present

## 2023-04-24 DIAGNOSIS — M5441 Lumbago with sciatica, right side: Secondary | ICD-10-CM | POA: Diagnosis not present

## 2023-04-24 NOTE — Progress Notes (Unsigned)
Jonathan Page - 72 y.o. male MRN [id number]  Date of birth: [birth year]  Office Visit Note: Visit Date: 04/24/2023 PCP: Doreene Nest, NP Referred by: Doreene Nest, NP  Subjective: Chief Complaint  Patient presents with   Lower Back - Pain   HPI: Jonathan Page is a 72 y.o. male who comes in today per the request of Vernona Rieger, NP for evaluation of chronic, worsening and severe bilateral lower back pain radiating to anterior thighs, groin and down to knees. Pain ongoing for several months, started after fall while bowling in March of 2024. His pain worsens with prolonged sitting and laying down. Walking seems to help alleviate his pain. He describes pain as sore and aching sensation, currently rates as 6 out of 10. Some relief of pain with home exercise regimen, rest and use of medications. His PCP recently recommended physical therapy, however he declined. He was evaluated in the emergency department for lower back pain on 04/20/2023, he was discharged with Gabapentin 100 mg TID. He was also recently evaluated by Dr. Karleen Hampshire Copland with [facility], his notes can be further reviewed in EPIC. No prior imaging of lumbar spine. He reports issues with high stress at this time due to mother being placed in nursing home. States he has lost 50 lbs over the last several months. Patient denies focal weakness, numbness, tingling. No recent trauma or falls.   Patients course is complicated by diabetes mellitus and chronic kidney disease.      Review of Systems  Musculoskeletal:  Positive for back pain.  Neurological:  Negative for tingling, sensory change, focal weakness and weakness.  All other systems reviewed and are negative.  Otherwise per HPI.  Assessment & Plan: Visit Diagnoses:    ICD-10-CM   1. Lumbar radiculopathy  M54.16 XR Lumbar Spine Complete    2. Chronic bilateral low back pain with bilateral sciatica  M54.42    M54.41    G89.29     3. Bilateral  groin pain  R10.31    R10.32        Plan: Findings:  Chronic, worsening and severe bilateral lower back pain radiating to anterior thighs down to knees. Patient continues to have severe pain despite good conservative therapies such as home exercise regimen, rest and use of medications. Patients clinical presentation and exam are complex, differentials include lumbar radiculopathy vs intrinsic hip issue. Could be more of claudication type symptoms due to central canal stenosis. His exam today was non-focal, good strength noted to bilateral lower extremities, no pain with internal/external rotation of hips. I obtained lumbar radiographs in the office that show degenerative changes to lower lumbar region and anterolisthesis of L4 on L5. We discussed treatment plan in detail today, next step is to obtain lumbar MRI imaging. Depending on lumbar MRI we discussed possibility of performing lumbar epidural steroid injection. I will see patient back for lumbar MRI review. He has no questions at this time. No red flag symptoms noted upon exam today.     Meds & Orders: No orders of the defined types were placed in this encounter.   Orders Placed This Encounter  Procedures   XR Lumbar Spine Complete    Follow-up: Return for lumbar MRI review.   Procedures: No procedures performed      Clinical History: No specialty comments available.   He reports that he has been smoking cigarettes and cigars. He started smoking about 34 years ago. He has a 15 pack-year smoking  history. He has never used smokeless tobacco.  Recent Labs    12/21/22 1449  HGBA1C 7.5*    Objective:  VS:  HT:    WT:   BMI:     BP:   HR: bpm  TEMP: ( )  RESP:  Physical Exam Vitals and nursing note reviewed.  HENT:     Head: Normocephalic and atraumatic.     Right Ear: External ear normal.     Left Ear: External ear normal.     Nose: Nose normal.     Mouth/Throat:     Mouth: Mucous membranes are moist.  Eyes:      Extraocular Movements: Extraocular movements intact.  Cardiovascular:     Rate and Rhythm: Normal rate.     Pulses: Normal pulses.  Pulmonary:     Effort: Pulmonary effort is normal.  Abdominal:     General: Abdomen is flat. There is no distension.  Musculoskeletal:        General: Tenderness present.     Cervical back: Normal range of motion.     Comments: Patient is slow to rise from seated position to standing. Good lumbar range of motion. No pain noted with facet loading. 5/5 strength noted with bilateral hip flexion, knee flexion/extension, ankle dorsiflexion/plantarflexion and EHL. No clonus noted bilaterally. No pain upon palpation of greater trochanters. No pain with internal/external rotation of bilateral hips. Sensation intact bilaterally. Negative slump test bilaterally. Ambulates without aid, gait steady.     Skin:    General: Skin is warm and dry.     Capillary Refill: Capillary refill takes less than 2 seconds.  Neurological:     General: No focal deficit present.     Mental Status: He is alert and oriented to person, place, and time.  Psychiatric:        Mood and Affect: Mood normal.        Behavior: Behavior normal.     Ortho Exam  Imaging: No results found.  Past Medical/Family/Surgical/Social History: Medications & Allergies reviewed per EMR, new medications updated. Patient Active Problem List   Diagnosis Date Noted   Chest pain 01/20/2023   Left leg pain 10/28/2022   Skin lesion 04/16/2021   Preventative health care 11/08/2017   Syncope and collapse 10/14/2016   Prolonged QT interval 10/14/2016   Hyperlipidemia 01/25/2016   CKD (chronic kidney disease) stage 3, GFR 30-59 ml/min (HCC) 01/25/2016   Essential hypertension 10/27/2015   Type 2 diabetes mellitus with hyperglycemia (HCC) 10/22/2015   Past Medical History:  Diagnosis Date   Cellulitis 02/21/2018   Concussion syndrome    Diabetes (HCC)    Frequent headaches    Hypertension    Family  History  Problem Relation Age of Onset   Diabetes Father    Diabetes Paternal Grandfather    Diabetes Paternal Grandmother    Colon cancer Neg Hx    Crohn's disease Neg Hx    Rectal cancer Neg Hx    Stomach cancer Neg Hx    Esophageal cancer Neg Hx    Past Surgical History:  Procedure Laterality Date   APPENDECTOMY  1960   COLONOSCOPY     WISDOM TOOTH EXTRACTION     Social History   Occupational History   Not on file  Tobacco Use   Smoking status: Some Days    Current packs/day: 0.00    Average packs/day: 0.5 packs/day for 30.0 years (15.0 ttl pk-yrs)    Types: Cigarettes, Cigars    Start  date: 10/1988    Last attempt to quit: 10/2018    Years since quitting: 4.5   Smokeless tobacco: Never  Vaping Use   Vaping status: Never Used  Substance and Sexual Activity   Alcohol use: Yes    Alcohol/week: 6.0 standard drinks of alcohol    Types: 6 Cans of beer per week   Drug use: No   Sexual activity: Not on file

## 2023-04-24 NOTE — Progress Notes (Unsigned)
Lower back pain since June or July of this year. Has had several falls. No numbness or tingling in either leg. Nothing makes it worse. He was at the hospital Thursday for lower back and leg pain. He was given Gabapentin to take, and it helps.

## 2023-04-27 ENCOUNTER — Ambulatory Visit: Payer: Medicare HMO | Admitting: Family Medicine

## 2023-04-27 ENCOUNTER — Telehealth: Payer: Self-pay | Admitting: *Deleted

## 2023-04-27 NOTE — Telephone Encounter (Signed)
Do you want me to cancel his 2:00 pm appointment or charge no show fee?

## 2023-04-27 NOTE — Telephone Encounter (Signed)
-----   Message from [place] Copland sent at 04/26/2023  4:51 PM EST ----- Regarding: tomorrow I noticed Jonathan Page on my schedule tomorrow, but I know that he just saw the physical medicine and rehab doctors at [facility] for his back.  He can cancel his appointment unless he wants to talk to me about something else.

## 2023-04-27 NOTE — Telephone Encounter (Signed)
I went ahead and cancelled appointment.

## 2023-04-27 NOTE — Telephone Encounter (Signed)
Left message for [name] to return call to office.  Per Dr. Patsy Lager, since he was recently seen by pain/rehab doctor at [facility] for his back he can cancel today's appointment with Dr. Patsy Lager unless he want to still come in and discuss something else.  I ask patient to return call to office to let us know what he would like to do.

## 2023-05-04 ENCOUNTER — Encounter: Payer: Self-pay | Admitting: Physical Medicine and Rehabilitation

## 2023-05-08 ENCOUNTER — Telehealth: Payer: Self-pay

## 2023-05-08 NOTE — Telephone Encounter (Signed)
Transition Care Management Unsuccessful Follow-up Telephone Call  Date of discharge and from where:  [facility] 11/7  Attempts:  1st Attempt  Reason for unsuccessful TCM follow-up call:  No answer/busy   Jonathan Page  [facility], [facility] Guide, Phone: [phone] Website: [email or web address]

## 2023-05-09 ENCOUNTER — Telehealth: Payer: Self-pay

## 2023-05-09 NOTE — Telephone Encounter (Signed)
Transition Care Management Unsuccessful Follow-up Telephone Call  Date of discharge and from where:  [facility]  Attempts:  2nd Attempt  Reason for unsuccessful TCM follow-up call:  No answer/busy   Lenard Forth [place]  [facility], [facility] Guide, Phone: [phone] Website: [email or web address]

## 2023-05-10 ENCOUNTER — Ambulatory Visit
Admission: RE | Admit: 2023-05-10 | Discharge: 2023-05-10 | Disposition: A | Discharge status: Home / Self Care | Payer: Medicare HMO | Source: Ambulatory Visit | Attending: Physical Medicine and Rehabilitation | Admitting: Physical Medicine and Rehabilitation

## 2023-05-10 DIAGNOSIS — R1031 Right lower quadrant pain: Secondary | ICD-10-CM

## 2023-05-10 DIAGNOSIS — C7951 Secondary malignant neoplasm of bone: Secondary | ICD-10-CM | POA: Diagnosis not present

## 2023-05-10 DIAGNOSIS — M5416 Radiculopathy, lumbar region: Secondary | ICD-10-CM

## 2023-05-10 DIAGNOSIS — C801 Malignant (primary) neoplasm, unspecified: Secondary | ICD-10-CM | POA: Diagnosis not present

## 2023-05-10 DIAGNOSIS — G8929 Other chronic pain: Secondary | ICD-10-CM

## 2023-05-15 ENCOUNTER — Telehealth: Payer: Self-pay | Admitting: Primary Care

## 2023-05-15 NOTE — Telephone Encounter (Signed)
Called and advised patient that Jonathan Page was not the ordering provider for the MRI. Provided contact information for patient to reach out to ordering providers office .

## 2023-05-15 NOTE — Telephone Encounter (Signed)
Patient called in to follow up on his MRI results. Thank you!

## 2023-05-22 ENCOUNTER — Encounter: Payer: Self-pay | Admitting: Physical Medicine and Rehabilitation

## 2023-05-22 ENCOUNTER — Ambulatory Visit: Payer: Medicare HMO | Admitting: Physical Medicine and Rehabilitation

## 2023-05-22 DIAGNOSIS — R1031 Right lower quadrant pain: Secondary | ICD-10-CM

## 2023-05-22 DIAGNOSIS — G8929 Other chronic pain: Secondary | ICD-10-CM

## 2023-05-22 DIAGNOSIS — M5416 Radiculopathy, lumbar region: Secondary | ICD-10-CM

## 2023-05-22 DIAGNOSIS — M47816 Spondylosis without myelopathy or radiculopathy, lumbar region: Secondary | ICD-10-CM | POA: Diagnosis not present

## 2023-05-22 DIAGNOSIS — R1032 Left lower quadrant pain: Secondary | ICD-10-CM | POA: Diagnosis not present

## 2023-05-22 DIAGNOSIS — M5441 Lumbago with sciatica, right side: Secondary | ICD-10-CM

## 2023-05-22 DIAGNOSIS — M5442 Lumbago with sciatica, left side: Secondary | ICD-10-CM

## 2023-05-22 MED ORDER — GABAPENTIN 300 MG PO CAPS: 300.0000 mg | ORAL_CAPSULE | Freq: Every day | ORAL | 0 refills | Status: DC

## 2023-05-22 NOTE — Progress Notes (Signed)
Jonathan Page - 72 y.o. male MRN [id number]  Date of birth: [birth year]  Office Visit Note: Visit Date: 05/22/2023 PCP: Doreene Nest, NP Referred by: Doreene Nest, NP  Subjective: Chief Complaint  Patient presents with   Lower Back - Pain   HPI: Jonathan Page is a 72 y.o. male who comes in today for evaluation of chronic, worsening and severe bilateral lower back pain radiating to bilateral groin and anterior thighs, left greater than right. Pain started in October. His pain becomes severe with activity and laying flat to sleep. States he is unable to sleep at night due to severe pain. He describes his pain as sore and aching sensation, currently rates as 8 out of 10. Some relief of pain with walking and use of Gabapentin at bedtime. No history of formal physical therapy, he does not wish to participate in PT as these exercises are difficult for him to perform. Upon independent review of recent lumbar MRI imaging there are multi level degenerative changes, severe right sided foraminal stenosis at L4-L5. No high grade spinal canal stenosis noted. Patient denies focal weakness, numbness and tingling. No recent trauma or falls.   Patient reports weight loss of 50 lbs since October, he reports decreased appetite and chronic fatigue. He reports issues with high stress at this time due to mother being placed in nursing home.     Review of Systems  Musculoskeletal:  Positive for back pain.  Neurological:  Negative for tingling, sensory change, focal weakness and weakness.  All other systems reviewed and are negative.  Otherwise per HPI.  Assessment & Plan: Visit Diagnoses:    ICD-10-CM   1. Lumbar radiculopathy  M54.16     2. Chronic bilateral low back pain with bilateral sciatica  M54.42    M54.41    G89.29     3. Bilateral groin pain  R10.31    R10.32     4. Facet arthropathy, lumbar  M47.816        Plan: Findings:  Chronic, worsening and severe bilateral lower back  pain radiating to bilateral groin and anterior thighs, no pain past the knees. Left greater than right. Patient continues to have severe pain despite good conservative therapies such as home exercise regimen, rest and use of medications. Recent lumbar MRI imaging has not been formally read, I discussed recent lumbar MRI with patient today using imaging and spine model. Patients clinical presentation and exam do not directly correlate with recent lumbar MRI imaging. We discussed treatment plan in detail today, next step is to perform diagnostic and hopefully therapeutic left L5-S1 interlaminar epidural steroid injection under fluoroscopic guidance. He is not currently taking anticoagulant medication. If good relief of pain with injection we can repeat this procedure infrequently as needed.  I also discussed medication management, I increased his dose of Gabapentin to 300 mg at bedtime, if tolerating well there is room to titrate up in the future. No red flag symptoms noted upon exam today.   I am concerned about recent weight loss and chronic fatigue. I do feel physical therapy will help with strengthening but I did encourage patient to speak with his PCP regarding these symptoms.     Meds & Orders:  Meds ordered this encounter  Medications   gabapentin (NEURONTIN) 300 MG capsule    Sig: Take 1 capsule (300 mg total) by mouth at bedtime.    Dispense:  30 capsule    Refill:  0   No orders  of the defined types were placed in this encounter.   Follow-up: No follow-ups on file.   Procedures: No procedures performed      Clinical History: No specialty comments available.   He reports that he has been smoking cigarettes and cigars. He started smoking about 34 years ago. He has a 15 pack-year smoking history. He has never used smokeless tobacco.  Recent Labs    12/21/22 1449  HGBA1C 7.5*    Objective:  VS:  HT:    WT:   BMI:     BP:   HR: bpm  TEMP: ( )  RESP:  Physical Exam Vitals and  nursing note reviewed.  HENT:     Head: Normocephalic and atraumatic.     Right Ear: External ear normal.     Left Ear: External ear normal.     Nose: Nose normal.     Mouth/Throat:     Mouth: Mucous membranes are moist.  Eyes:     Extraocular Movements: Extraocular movements intact.  Cardiovascular:     Rate and Rhythm: Normal rate.     Pulses: Normal pulses.  Pulmonary:     Effort: Pulmonary effort is normal.  Abdominal:     General: Abdomen is flat. There is no distension.  Musculoskeletal:        General: Tenderness present.     Cervical back: Normal range of motion.     Comments: Patient rises from seated position to standing without difficulty. Good lumbar range of motion. No pain noted with facet loading. 5/5 strength noted with bilateral hip flexion, knee flexion/extension, ankle dorsiflexion/plantarflexion and EHL. No clonus noted bilaterally. No pain upon palpation of greater trochanters. No pain with internal/external rotation of bilateral hips. Sensation intact bilaterally. Negative slump test bilaterally. Ambulates without aid, gait steady.     Skin:    General: Skin is warm and dry.     Capillary Refill: Capillary refill takes less than 2 seconds.  Neurological:     General: No focal deficit present.     Mental Status: He is alert and oriented to person, place, and time.  Psychiatric:        Mood and Affect: Mood normal.        Behavior: Behavior normal.     Ortho Exam  Imaging: No results found.  Past Medical/Family/Surgical/Social History: Medications & Allergies reviewed per EMR, new medications updated. Patient Active Problem List   Diagnosis Date Noted   Chest pain 01/20/2023   Left leg pain 10/28/2022   Skin lesion 04/16/2021   Preventative health care 11/08/2017   Syncope and collapse 10/14/2016   Prolonged QT interval 10/14/2016   Hyperlipidemia 01/25/2016   CKD (chronic kidney disease) stage 3, GFR 30-59 ml/min (HCC) 01/25/2016   Essential  hypertension 10/27/2015   Type 2 diabetes mellitus with hyperglycemia (HCC) 10/22/2015   Past Medical History:  Diagnosis Date   Cellulitis 02/21/2018   Concussion syndrome    Diabetes (HCC)    Frequent headaches    Hypertension    Family History  Problem Relation Age of Onset   Diabetes Father    Diabetes Paternal Grandfather    Diabetes Paternal Grandmother    Colon cancer Neg Hx    Crohn's disease Neg Hx    Rectal cancer Neg Hx    Stomach cancer Neg Hx    Esophageal cancer Neg Hx    Past Surgical History:  Procedure Laterality Date   APPENDECTOMY  1960   COLONOSCOPY  WISDOM TOOTH EXTRACTION     Social History   Occupational History   Not on file  Tobacco Use   Smoking status: Some Days    Current packs/day: 0.00    Average packs/day: 0.5 packs/day for 30.0 years (15.0 ttl pk-yrs)    Types: Cigarettes, Cigars    Start date: 10/1988    Last attempt to quit: 10/2018    Years since quitting: 4.6   Smokeless tobacco: Never  Vaping Use   Vaping status: Never Used  Substance and Sexual Activity   Alcohol use: Yes    Alcohol/week: 6.0 standard drinks of alcohol    Types: 6 Cans of beer per week   Drug use: No   Sexual activity: Not on file

## 2023-05-23 ENCOUNTER — Telehealth: Payer: Self-pay

## 2023-05-23 ENCOUNTER — Other Ambulatory Visit: Payer: Self-pay | Admitting: Physical Medicine and Rehabilitation

## 2023-05-23 ENCOUNTER — Telehealth: Payer: Self-pay | Admitting: Physical Medicine and Rehabilitation

## 2023-05-23 DIAGNOSIS — M5416 Radiculopathy, lumbar region: Secondary | ICD-10-CM

## 2023-05-23 DIAGNOSIS — R937 Abnormal findings on diagnostic imaging of other parts of musculoskeletal system: Secondary | ICD-10-CM

## 2023-05-23 NOTE — Telephone Encounter (Signed)
Patient's formal read on lumbar MRI imaging back this morning. There is concern for possible widespread bony metastasis on the right at L4-L5 where there is extraosseous tumor extension. Recommend further imaging and metastatic workup. I will place injection on hold at this time and proceed with further imaging. I will also sent note to his primary care provider, he will likely need referral to oncology. I called patient this morning to inform him of results.

## 2023-05-23 NOTE — Telephone Encounter (Signed)
[facility] called wanting to make sure you received his MRI

## 2023-05-24 NOTE — Progress Notes (Signed)
[facility] [phone]   [phone]  INITIAL CONSULTATION:  Patient Care Team: Doreene Nest, NP as PCP - General (Internal Medicine)  CHIEF COMPLAINTS/PURPOSE OF CONSULTATION:  "Bone lesions"  HISTORY OF PRESENTING ILLNESS:  Jonathan Page 72 y.o. male with medical history significant for essential hypertension, type 2 diabetes, CKD stage III, hyperlipidemia, prolonged QT.  On review of the previous records patient is being referred for abnormal MRI finding. Patient was being evaluated by orthopedics for back pain and first had an xray of lumbar spine on 04/24/23 which showed degenerative changes to the lower lumbar region and anterolisthesis on L4 and L5. An MRI of lumbar spine was then performed on 05/10/23 and impression shows widespread bony metastasis with dominant deposit on the right L4 and L5 where there is extraosseous tumor extension involving the right L4-5 foramen, epidural space and the adjacent right lumbar plexus.  No cauda equina.  On exam today patient is accompanied by his assistant who provides additional history.  Patient states he started having bilateral leg pain x 4 months.  Describes the pain as an aching sensation in his knees that radiates up to his hips.  Pain is intermittent.  In the last 2 to 3 weeks he also developed low back pain.  He thinks that pain has worsened in the last 48 hours. He does admit that the pain keeps him awake at night sometimes. He is currently managing pain with gabapentin and Tylenol.  The orthopedist recently increased his dose of gabapentin from 100 mg to 300 mg  approximately 1 week ago.  He denies any fall or injury preceding pain.  Denies any changes in urinary or bowel habits.  Denies any numbness or tingling.  Patient's assistant reports he has had unintentional weight loss of approximately 40 pounds over the last 4 months as well.  He reports he has had decreased appetite  recently although attributes that to new stress of possible cancer diagnosis.  Further discussion reveals that patient has tobacco history of half a pack per day x 20 years.  He drinks alcohol daily.  He was retired however started working at [employer] earlier this year.  Patient denies family history of malignancy.  His last colonoscopy was 5 years ago and was normal.  He is scheduled to have later this month on 06/12/2023.  His PSA was checked by PCP and within normal range as far as he can recall.  Marland Kitchen  MEDICAL HISTORY:  Past Medical History:  Diagnosis Date   Cellulitis 02/21/2018   Concussion syndrome    Diabetes (HCC)    Frequent headaches    Hypertension     SURGICAL HISTORY: Past Surgical History:  Procedure Laterality Date   APPENDECTOMY  1960   COLONOSCOPY     WISDOM TOOTH EXTRACTION      SOCIAL HISTORY: Social History   Socioeconomic History   Marital status: Divorced    Spouse name: Not on file   Number of children: Not on file   Years of education: Not on file   Highest education level: Not on file  Occupational History   Not on file  Tobacco Use   Smoking status: Some Days    Current packs/day: 0.00    Average packs/day: 0.5 packs/day for 30.0 years (15.0 ttl pk-yrs)    Types: Cigarettes, Cigars    Start date: 10/1988    Last attempt to quit: 10/2018    Years since quitting: 4.6  Smokeless tobacco: Never  Vaping Use   Vaping status: Never Used  Substance and Sexual Activity   Alcohol use: Yes    Alcohol/week: 6.0 standard drinks of alcohol    Types: 6 Cans of beer per week   Drug use: No   Sexual activity: Not on file  Other Topics Concern   Not on file  Social History Narrative   Divorced.   2 children, 1 grandchild.   Retired. Once worked as a Financial controller.    Enjoys relaxing.    Social Drivers of Corporate investment banker Strain: Not on file  Food Insecurity: Not on file  Transportation Needs: Not on file  Physical Activity: Not  on file  Stress: Not on file  Social Connections: Not on file  Intimate Partner Violence: Not on file    FAMILY HISTORY: Family History  Problem Relation Age of Onset   Diabetes Father    Diabetes Paternal Grandfather    Diabetes Paternal Grandmother    Colon cancer Neg Hx    Crohn's disease Neg Hx    Rectal cancer Neg Hx    Stomach cancer Neg Hx    Esophageal cancer Neg Hx     ALLERGIES:  has no known allergies.  MEDICATIONS:  Current Outpatient Medications  Medication Sig Dispense Refill   acetaminophen (TYLENOL) 500 MG tablet Take 500 mg by mouth every 6 (six) hours as needed.     aspirin EC 81 MG tablet Take 81 mg by mouth daily.     Blood Glucose Monitoring Suppl (ACCU-CHEK AVIVA PLUS) w/Device KIT Check blood sugar before breakfast, lunch and bedtime and as directed. Dx E11.65 1 kit 0   gabapentin (NEURONTIN) 300 MG capsule Take 1 capsule (300 mg total) by mouth at bedtime. 30 capsule 0   glipiZIDE (GLUCOTROL XL) 10 MG 24 hr tablet Take 1 tablet (10 mg total) by mouth daily with breakfast. for diabetes. 90 tablet 1   glucose blood (ACCU-CHEK AVIVA PLUS) test strip Check blood sugar before breakfast, lunch and bedtime and as directed. Dx E11.65 300 each 1   Lancets (ACCU-CHEK SOFT TOUCH) lancets Check blood sugar before breakfast, lunch and bedtime and as directed. Dx E11.65 300 each 1   lisinopril (ZESTRIL) 10 MG tablet Take 1 tablet (10 mg total) by mouth daily. 90 tablet 1   pravastatin (PRAVACHOL) 40 MG tablet Take 1 tablet (40 mg total) by mouth daily. For cholesterol. 90 tablet 3   No current facility-administered medications for this visit.    REVIEW OF SYSTEMS:   All other systems are reviewed and are negative for acute change except as noted in the HPI.  PHYSICAL EXAMINATION: ECOG PERFORMANCE STATUS: 1 - Symptomatic but completely ambulatory  Vitals:   05/25/23 1203 05/25/23 1204  BP: (!) 165/96 (!) 150/97  Pulse: 92   Resp: 18   Temp: 98.3 F (36.8 C)    SpO2: 98%    Filed Weights   05/25/23 1203  Weight: 161 lb 6.4 oz (73.2 kg)    Physical Exam Vitals reviewed.  Constitutional:      Appearance: He is not ill-appearing or toxic-appearing.     Comments: Thin male  HENT:     Head: Normocephalic.     Right Ear: External ear normal.     Left Ear: External ear normal.     Nose: Nose normal.  Eyes:     General: No scleral icterus.    Conjunctiva/sclera: Conjunctivae normal.  Cardiovascular:  Rate and Rhythm: Normal rate and regular rhythm.     Pulses: Normal pulses.     Heart sounds: Normal heart sounds.  Pulmonary:     Effort: Pulmonary effort is normal.     Breath sounds: Normal breath sounds.  Abdominal:     General: There is no distension.     Palpations: Abdomen is soft.     Tenderness: There is no abdominal tenderness.  Musculoskeletal:     Cervical back: Normal range of motion. No tenderness.     Comments: Full range of motion of the  C- spine, T-spine and L-spine No tenderness to palpation of the spinous processes of the T-spine or L-spine No crepitus, deformity or step-offs No tenderness to palpation of the paraspinous muscles of the L-spine    Neurological:     Mental Status: He is alert.     Comments: Ambulatory with normal gait.  No saddle anesthesia.  Equal strength in all extremities.  Sensation intact to all extremities.       LABORATORY DATA:  I have reviewed the data as listed    Latest Ref Rng & Units 05/25/2023    1:27 PM 04/20/2023    3:22 PM 01/20/2023    9:48 AM  CBC  WBC 4.0 - 10.5 K/uL 6.9  8.3  4.7   Hemoglobin 13.0 - 17.0 g/dL 09.8  11.9  14.7   Hematocrit 39.0 - 52.0 % 40.6  42.0  43.0   Platelets 150 - 400 K/uL 209  212  168.0        Latest Ref Rng & Units 05/25/2023    1:27 PM 04/20/2023    3:22 PM 02/08/2023    1:56 PM  CMP  Glucose 70 - 99 mg/dL 829  562  73   BUN 8 - 23 mg/dL 15  16  21    Creatinine 0.61 - 1.24 mg/dL 1.30  8.65  7.84   Sodium 135 - 145 mmol/L 136  135  139    Potassium 3.5 - 5.1 mmol/L 4.0  3.9  4.1   Chloride 98 - 111 mmol/L 98  102  105   CO2 22 - 32 mmol/L 31  23  27    Calcium 8.9 - 10.3 mg/dL 69.6  29.5  9.5   Total Protein 6.5 - 8.1 g/dL 8.0     Total Bilirubin <1.2 mg/dL 0.7     Alkaline Phos 38 - 126 U/L 251     AST 15 - 41 U/L 12     ALT 0 - 44 U/L 11        RADIOGRAPHIC STUDIES: I have personally reviewed the radiological images as listed and agreed with the findings in the report. MR LUMBAR SPINE WO CONTRAST Result Date: 05/23/2023 CLINICAL DATA:  Low back pain with radiculopathy. EXAM: MRI LUMBAR SPINE WITHOUT CONTRAST TECHNIQUE: Multiplanar, multisequence MR imaging of the lumbar spine was performed. No intravenous contrast was administered. COMPARISON:  None Available. FINDINGS: Segmentation:  Standard. Alignment:  Mild degenerative anterolisthesis at L5-S1 Vertebrae: Multiple infiltrating bone lesions seen at every lumbar level and in the bilateral sacrum and ilium. Most notable is a confluent deposit at the L4 and L5 levels, right eccentric with growth through cortex along the right lumbar plexus and infiltrating the right L4-5 foramen especially. Epidural tumor ventrally and on the right at this level. No acute fracture. Conus medullaris and cauda equina: Conus extends to the T12 level. Conus and cauda equina appear normal. Paraspinal and other soft tissues:  Innumerable renal cysts. Extraosseous tumor emanating from the right L4 level with right psoas contact and implied lumbar plexus involvement. Disc levels: Ordinary and generalized disc ridging and facet spurring eccentric to the right. No degenerative impingement. These results will be called to the ordering clinician or representative by the Radiologist Assistant, and communication documented in the PACS or [employer]. IMPRESSION: Findings of widespread bony metastasis with dominant deposit on the right at L4 and L5 where there is extraosseous tumor extension involving the  right L4-5 foramen, epidural space, and the adjacent right lumbar plexus. Recommend postcontrast assessment and metastatic workup. Electronically Signed   By: Tiburcio Pea M.D.   On: 05/23/2023 07:27    ASSESSMENT & PLAN Jonathan Page is a 72 y.o. male presenting [facility] for consultation regarding bone lesions. We have reviewed etiologies including malignancy vs metastatic disease. Patient will proceed with laboratory workup today.   #Bone lesions - Reviewed results of MRI lumbar spine with patient - Discussed differential diagnoses - Labs collected today include: CBC, CMP, LDH, multiple myeloma panel, serum free light chains. -Will also order CT chest abdomen pelvis to complete staging work up, -Discussed pain management with patient.  He will continue trial of increased gabapentin dose with Tylenol.  Patient knows to reach out if pain does not improve.  #Hyperglycemia - CMP today showing glucose of 437.  Bicarb and anion gap are normal, doubt DKA. - I called patient to discuss hyperglycemia and he reports diabetes medications were discontinued several months ago because " everything was under control." - Patient will need close follow-up with PCP, staff message sent to his provider.  #Age related screenings -Colonoscopy scheduled for 06/12/23 - Per chart review PSA x 5 months ago WNL although rising compared to prior. Will check PSA today.  -Patient will RTC when work up is complete.  Patient expressed understanding of the recommended workup and is agreeable to move forward.   All questions were answered. The patient knows to call the clinic with any problems, questions or concerns.  Shared visit with Dr. Leonides Schanz.  Orders Placed This Encounter  Procedures   CT CHEST ABDOMEN PELVIS W CONTRAST    Standing Status:   Future    Expected Date:   06/01/2023    Expiration Date:   05/24/2024    If indicated for the ordered procedure, I authorize the  administration of contrast media per Radiology protocol:   Yes    Does the patient have a contrast media/X-ray dye allergy?:   No    Preferred imaging location?:   [facility]    If indicated for the ordered procedure, I authorize the administration of oral contrast media per Radiology protocol:   Yes   Prostate-Specific AG, Serum    Standing Status:   Future    Number of Occurrences:   1    Expiration Date:   05/24/2024   CBC with Differential (Cancer Center Only)    Standing Status:   Future    Number of Occurrences:   1    Expiration Date:   05/24/2024   CMP (Cancer Center only)    Standing Status:   Future    Number of Occurrences:   1    Expiration Date:   05/24/2024   Lactate dehydrogenase (LDH)    Standing Status:   Future    Number of Occurrences:   1    Expiration Date:   05/24/2024   Multiple Myeloma Panel (SPEP&IFE  w/QIG)    Standing Status:   Future    Number of Occurrences:   1    Expiration Date:   05/24/2024   Kappa/lambda light chains    Standing Status:   Future    Number of Occurrences:   1    Expiration Date:   05/24/2024      I have spent a total of 60 minutes minutes of face-to-face and non-face-to-face time, preparing to see the patient, obtaining and/or reviewing separately obtained history, performing a medically appropriate examination, counseling and educating the patient, ordering medications/tests/procedures, referring and communicating with other health care professionals, documenting clinical information in the electronic health record, independently interpreting results and communicating results to the patient, and care coordination.   Namon Cirri PA-C Department of Hematology/Oncology [facility] Phone: [phone]  I have read the above note and personally examined the patient. I agree with the assessment and plan as noted above.  Briefly Jonathan Page is a 72 year old male who presents  for evaluation of apparent metastatic lesions on MRI of the spine.  MRI spine was performed on 05/10/2023 which showed widespread bony metastasis with dominant deposits on the L4 and L5 with extraosseous tumor extension.  At this time I would recommend completing staging with a full CT chest abdomen pelvis.  Pending the results of this we can determine if there is an area amenable to biopsy easier than the spine.  Additionally we will order a PSA, multiple myeloma panel, LDH as well as baseline CBC and CMP.  The patient voiced understanding's of our findings and the plan moving forward.  He voices understanding that this likely represents a malignancy.   Jonathan Barns, MD Department of Hematology/Oncology [facility] at [facility] Phone: [phone] Pager: [phone] Email: [email or web address]

## 2023-05-25 ENCOUNTER — Telehealth: Payer: Self-pay | Admitting: Primary Care

## 2023-05-25 ENCOUNTER — Inpatient Hospital Stay: Payer: Medicare HMO | Attending: Physician Assistant | Admitting: Physician Assistant

## 2023-05-25 ENCOUNTER — Inpatient Hospital Stay: Payer: Medicare HMO

## 2023-05-25 VITALS — BP 150/97 | HR 92 | Temp 98.3°F | Resp 18 | Wt 161.4 lb

## 2023-05-25 DIAGNOSIS — M4317 Spondylolisthesis, lumbosacral region: Secondary | ICD-10-CM | POA: Insufficient documentation

## 2023-05-25 DIAGNOSIS — Z9049 Acquired absence of other specified parts of digestive tract: Secondary | ICD-10-CM | POA: Diagnosis not present

## 2023-05-25 DIAGNOSIS — Z79899 Other long term (current) drug therapy: Secondary | ICD-10-CM | POA: Insufficient documentation

## 2023-05-25 DIAGNOSIS — F17210 Nicotine dependence, cigarettes, uncomplicated: Secondary | ICD-10-CM | POA: Diagnosis not present

## 2023-05-25 DIAGNOSIS — E1122 Type 2 diabetes mellitus with diabetic chronic kidney disease: Secondary | ICD-10-CM | POA: Diagnosis not present

## 2023-05-25 DIAGNOSIS — M47816 Spondylosis without myelopathy or radiculopathy, lumbar region: Secondary | ICD-10-CM | POA: Diagnosis not present

## 2023-05-25 DIAGNOSIS — R739 Hyperglycemia, unspecified: Secondary | ICD-10-CM | POA: Insufficient documentation

## 2023-05-25 DIAGNOSIS — F17290 Nicotine dependence, other tobacco product, uncomplicated: Secondary | ICD-10-CM | POA: Diagnosis not present

## 2023-05-25 DIAGNOSIS — I129 Hypertensive chronic kidney disease with stage 1 through stage 4 chronic kidney disease, or unspecified chronic kidney disease: Secondary | ICD-10-CM | POA: Diagnosis not present

## 2023-05-25 DIAGNOSIS — M25551 Pain in right hip: Secondary | ICD-10-CM | POA: Insufficient documentation

## 2023-05-25 DIAGNOSIS — N183 Chronic kidney disease, stage 3 unspecified: Secondary | ICD-10-CM | POA: Diagnosis not present

## 2023-05-25 DIAGNOSIS — M25552 Pain in left hip: Secondary | ICD-10-CM | POA: Insufficient documentation

## 2023-05-25 DIAGNOSIS — M899 Disorder of bone, unspecified: Secondary | ICD-10-CM | POA: Diagnosis not present

## 2023-05-25 DIAGNOSIS — N281 Cyst of kidney, acquired: Secondary | ICD-10-CM | POA: Insufficient documentation

## 2023-05-25 DIAGNOSIS — Z833 Family history of diabetes mellitus: Secondary | ICD-10-CM | POA: Insufficient documentation

## 2023-05-25 DIAGNOSIS — E785 Hyperlipidemia, unspecified: Secondary | ICD-10-CM | POA: Diagnosis not present

## 2023-05-25 DIAGNOSIS — M4316 Spondylolisthesis, lumbar region: Secondary | ICD-10-CM | POA: Diagnosis not present

## 2023-05-25 DIAGNOSIS — R937 Abnormal findings on diagnostic imaging of other parts of musculoskeletal system: Secondary | ICD-10-CM | POA: Diagnosis not present

## 2023-05-25 LAB — CBC WITH DIFFERENTIAL (CANCER CENTER ONLY)
Abs Immature Granulocytes: 0.02 10*3/uL (ref 0.00–0.07)
Basophils Absolute: 0 10*3/uL (ref 0.0–0.1)
Basophils Relative: 1 %
Eosinophils Absolute: 0 10*3/uL (ref 0.0–0.5)
Eosinophils Relative: 1 %
HCT: 40.6 % (ref 39.0–52.0)
Hemoglobin: 14 g/dL (ref 13.0–17.0)
Immature Granulocytes: 0 %
Lymphocytes Relative: 20 %
Lymphs Abs: 1.4 10*3/uL (ref 0.7–4.0)
MCH: 31.7 pg (ref 26.0–34.0)
MCHC: 34.5 g/dL (ref 30.0–36.0)
MCV: 92.1 fL (ref 80.0–100.0)
Monocytes Absolute: 0.5 10*3/uL (ref 0.1–1.0)
Monocytes Relative: 7 %
Neutro Abs: 5 10*3/uL (ref 1.7–7.7)
Neutrophils Relative %: 71 %
Platelet Count: 209 10*3/uL (ref 150–400)
RBC: 4.41 MIL/uL (ref 4.22–5.81)
RDW: 14.8 % (ref 11.5–15.5)
WBC Count: 6.9 10*3/uL (ref 4.0–10.5)
nRBC: 0 % (ref 0.0–0.2)

## 2023-05-25 LAB — CMP (CANCER CENTER ONLY)
ALT: 11 U/L (ref 0–44)
AST: 12 U/L — ABNORMAL LOW (ref 15–41)
Albumin: 4.8 g/dL (ref 3.5–5.0)
Alkaline Phosphatase: 251 U/L — ABNORMAL HIGH (ref 38–126)
Anion gap: 7 (ref 5–15)
BUN: 15 mg/dL (ref 8–23)
CO2: 31 mmol/L (ref 22–32)
Calcium: 10.8 mg/dL — ABNORMAL HIGH (ref 8.9–10.3)
Chloride: 98 mmol/L (ref 98–111)
Creatinine: 1.32 mg/dL — ABNORMAL HIGH (ref 0.61–1.24)
GFR, Estimated: 57 mL/min — ABNORMAL LOW (ref >60)
Glucose, Bld: 437 mg/dL — ABNORMAL HIGH (ref 70–99)
Potassium: 4 mmol/L (ref 3.5–5.1)
Sodium: 136 mmol/L (ref 135–145)
Total Bilirubin: 0.7 mg/dL (ref <1.2)
Total Protein: 8 g/dL (ref 6.5–8.1)

## 2023-05-25 LAB — LACTATE DEHYDROGENASE: LDH: 224 U/L — ABNORMAL HIGH (ref 98–192)

## 2023-05-25 NOTE — Patient Instructions (Signed)
Diagnostic Clinic Office Visit Discharge Information and Instructions  Thank you for choosing [facility] for your healthcare needs.  Below is a summary of today's discussion, along with our contact information and an outline of what to expect next.  Reason for Visit:  Bone lesion seen on MRI   Proposed Diagnostic Care Plan: Labs collected today CT scan of your chest abdomen and pelvis has been ordered. I am waiting on your insurance to improve the scan. Once approved I will call to let you know so you can schedule it. You will need to call the Central Scheduling Department at [phone].   What to Expect: - Generally, when lab tests are ordered the results can take up to 1 week for results to be available.  At that point, we will contact you to discuss your results with you.  Unless there is a critical result, we will typically wait for all of your lab results to be available before contacting you. - If a biopsy is part of your Care Plan, those results can take on average 7-10 days to result.  Once results are available, we will contact you to discuss your pathology results and any next steps. - If you have additional imaging ordered, such as a CT Scan, MRI, Ultrasound, Bone Scan, or PET scan, your imaging will need to be authorized then scheduled with the earliest available appointment.  You may be asked to travel to another hospital within [facility] who has a sooner availability, please consider doing so if asked. - If you use MyChart, your results will be available to you in the MyChart portal.  Your provider will be in touch with you as soon as all of your results are available to be discussed.  Your Diagnostic Clinic Provider:  Namon Cirri PA-C and Dr. Leonides Schanz  If you or your caregiver have number blocking on your cell phones, please ensure the cancer center's numbers are not blocked.  If you are not a registered MyChart user, please consider enrolling in MyChart to receive  your test results and visit notes.  You can also access your discharge instructions electronically.  MyChart also gives you an electronic means to communicate with your Care Team instead of needing to call in to the cancer center.  We appreciate you trusting [place] with your healthcare and look forward to partnering with you as we work to uncover what your potential diagnosis may be.  Please do not hesitate to reach out at any point with questions or concerns.

## 2023-05-25 NOTE — Telephone Encounter (Signed)
Pt called in and stated that he would like for a call back to get the reading from his diabetes test pt stated that he can't go on his MyChart and that he has been calling office all week getting his results

## 2023-05-26 LAB — PROSTATE-SPECIFIC AG, SERUM (LABCORP): Prostate Specific Ag, Serum: 2.7 ng/mL (ref 0.0–4.0)

## 2023-05-26 NOTE — Telephone Encounter (Signed)
Called patient and reviewed all information. Scheduled patient appt on 05/29/23 @ 10:15 with Dr. Alphonsus Sias.  Routing message to Dr. Alphonsus Sias so he is aware.

## 2023-05-26 NOTE — Telephone Encounter (Signed)
Unable to reach patient. Left voicemail to return call to our office.   See Staff message from [place] below:    Doreene Nest, NP  Shanon Ace, PA-C; P Clark Pool Hello! Yes, we will get him in ASAP. Thank you for the notification. Also, his diabetes medications were not discontinued.  Derricka Mertz, can we get him in with any provider for A1C and diabetes treatment since I am out of the office.     ----- Message ----- From: Kandice Hams Sent: 05/25/2023   3:29 PM EST To: Doreene Nest, NP Subject: mutual pt w/ hyperglycemia                    Reaching out about this mutual patient.  He was seen today in the [facility] at [facility] to workup bone lesions found on the MRI of his L-spine.  His CMP shows elevated glucose at 437, without DKA.  When I called patient to let him know the results he reports diabetes medications were discontinued several months ago because things were under control.  Is it possible for him to be seen in your office for close follow up of this?  Thanks, Karie Fetch.

## 2023-05-26 NOTE — Telephone Encounter (Signed)
Okay---- I will see him then

## 2023-05-27 LAB — KAPPA/LAMBDA LIGHT CHAINS
Kappa free light chain: 30.7 mg/L — ABNORMAL HIGH (ref 3.3–19.4)
Kappa, lambda light chain ratio: 1.59 (ref 0.26–1.65)
Lambda free light chains: 19.3 mg/L (ref 5.7–26.3)

## 2023-05-29 ENCOUNTER — Encounter: Payer: Self-pay | Admitting: Internal Medicine

## 2023-05-29 ENCOUNTER — Ambulatory Visit (HOSPITAL_COMMUNITY)
Admission: RE | Admit: 2023-05-29 | Discharge: 2023-05-29 | Disposition: A | Discharge status: Home / Self Care | Payer: Medicare HMO | Source: Ambulatory Visit | Attending: Physician Assistant | Admitting: Physician Assistant

## 2023-05-29 ENCOUNTER — Ambulatory Visit (INDEPENDENT_AMBULATORY_CARE_PROVIDER_SITE_OTHER): Payer: Medicare HMO | Admitting: Internal Medicine

## 2023-05-29 ENCOUNTER — Encounter (HOSPITAL_COMMUNITY): Payer: Self-pay

## 2023-05-29 ENCOUNTER — Ambulatory Visit: Payer: Medicare HMO

## 2023-05-29 ENCOUNTER — Other Ambulatory Visit: Payer: Self-pay

## 2023-05-29 VITALS — Ht 73.0 in | Wt 160.0 lb

## 2023-05-29 VITALS — BP 124/80 | HR 95 | Temp 97.7°F | Ht 71.0 in | Wt 157.0 lb

## 2023-05-29 DIAGNOSIS — K760 Fatty (change of) liver, not elsewhere classified: Secondary | ICD-10-CM | POA: Diagnosis not present

## 2023-05-29 DIAGNOSIS — C7951 Secondary malignant neoplasm of bone: Secondary | ICD-10-CM | POA: Diagnosis not present

## 2023-05-29 DIAGNOSIS — F17200 Nicotine dependence, unspecified, uncomplicated: Secondary | ICD-10-CM | POA: Insufficient documentation

## 2023-05-29 DIAGNOSIS — C801 Malignant (primary) neoplasm, unspecified: Secondary | ICD-10-CM | POA: Diagnosis not present

## 2023-05-29 DIAGNOSIS — R918 Other nonspecific abnormal finding of lung field: Secondary | ICD-10-CM | POA: Insufficient documentation

## 2023-05-29 DIAGNOSIS — N200 Calculus of kidney: Secondary | ICD-10-CM | POA: Diagnosis not present

## 2023-05-29 DIAGNOSIS — Z860100 Personal history of colon polyps, unspecified: Secondary | ICD-10-CM

## 2023-05-29 DIAGNOSIS — N1831 Chronic kidney disease, stage 3a: Secondary | ICD-10-CM

## 2023-05-29 DIAGNOSIS — E1121 Type 2 diabetes mellitus with diabetic nephropathy: Secondary | ICD-10-CM

## 2023-05-29 DIAGNOSIS — G2581 Restless legs syndrome: Secondary | ICD-10-CM | POA: Insufficient documentation

## 2023-05-29 DIAGNOSIS — I10 Essential (primary) hypertension: Secondary | ICD-10-CM

## 2023-05-29 DIAGNOSIS — E119 Type 2 diabetes mellitus without complications: Secondary | ICD-10-CM | POA: Diagnosis not present

## 2023-05-29 DIAGNOSIS — Z7984 Long term (current) use of oral hypoglycemic drugs: Secondary | ICD-10-CM | POA: Diagnosis not present

## 2023-05-29 DIAGNOSIS — E1165 Type 2 diabetes mellitus with hyperglycemia: Secondary | ICD-10-CM

## 2023-05-29 DIAGNOSIS — J432 Centrilobular emphysema: Secondary | ICD-10-CM | POA: Diagnosis not present

## 2023-05-29 DIAGNOSIS — R937 Abnormal findings on diagnostic imaging of other parts of musculoskeletal system: Secondary | ICD-10-CM

## 2023-05-29 LAB — POCT GLYCOSYLATED HEMOGLOBIN (HGB A1C): Hemoglobin A1C: 10.1 % — AB (ref 4.0–5.6)

## 2023-05-29 MED ORDER — GLIPIZIDE ER 10 MG PO TB24: 10.0000 mg | ORAL_TABLET | Freq: Every day | ORAL | 3 refills | Status: DC

## 2023-05-29 MED ORDER — NA SULFATE-K SULFATE-MG SULF 17.5-3.13-1.6 GM/177ML PO SOLN: 1.0000 | Freq: Once | ORAL | 0 refills | Status: AC

## 2023-05-29 MED ORDER — IOHEXOL 300 MG/ML  SOLN
100.0000 mL | Freq: Once | INTRAMUSCULAR | Status: AC | PRN
  Administered 2023-05-29: 100 mL via INTRAVENOUS

## 2023-05-29 MED ORDER — EMPAGLIFLOZIN 10 MG PO TABS: 10.0000 mg | ORAL_TABLET | Freq: Every day | ORAL | 3 refills | Status: DC

## 2023-05-29 NOTE — Assessment & Plan Note (Signed)
Better with the gabapentin 300mg  at bedtime

## 2023-05-29 NOTE — Progress Notes (Signed)
Subjective:    Patient ID: Jonathan Page, male    DOB: [birth year], 72 y.o.   MRN: [id number]  HPI Here to review elevated sugars  Went to oncologist ---bony lesions in spine Losing weight Undergoing work up  [employer] he is off glipizide for a year or so Has glucometer--but hasn't been checking  GFR up to 57 Had been in 30's and 40's  Has continued lisinopril  Gabapentin started about a month ago--for pain Just at bedtime--for leg pain Better if he gets up and walks  Current Outpatient Medications on File Prior to Visit  Medication Sig Dispense Refill   acetaminophen (TYLENOL) 500 MG tablet Take 500 mg by mouth every 6 (six) hours as needed.     aspirin EC 81 MG tablet Take 81 mg by mouth daily.     Blood Glucose Monitoring Suppl (ACCU-CHEK AVIVA PLUS) w/Device KIT Check blood sugar before breakfast, lunch and bedtime and as directed. Dx E11.65 1 kit 0   gabapentin (NEURONTIN) 300 MG capsule Take 1 capsule (300 mg total) by mouth at bedtime. 30 capsule 0   glucose blood (ACCU-CHEK AVIVA PLUS) test strip Check blood sugar before breakfast, lunch and bedtime and as directed. Dx E11.65 300 each 1   Lancets (ACCU-CHEK SOFT TOUCH) lancets Check blood sugar before breakfast, lunch and bedtime and as directed. Dx E11.65 300 each 1   lisinopril (ZESTRIL) 10 MG tablet Take 1 tablet (10 mg total) by mouth daily. 90 tablet 1   pravastatin (PRAVACHOL) 40 MG tablet Take 1 tablet (40 mg total) by mouth daily. For cholesterol. 90 tablet 3   glipiZIDE (GLUCOTROL XL) 10 MG 24 hr tablet Take 1 tablet (10 mg total) by mouth daily with breakfast. for diabetes. (Patient not taking: Reported on 05/29/2023) 90 tablet 1   No current facility-administered medications on file prior to visit.    No Known Allergies  Past Medical History:  Diagnosis Date   Cellulitis 02/21/2018   Concussion syndrome    Diabetes (HCC)    Frequent headaches    Hypertension     Past Surgical History:  Procedure  Laterality Date   APPENDECTOMY  1960   COLONOSCOPY     WISDOM TOOTH EXTRACTION      Family History  Problem Relation Age of Onset   Diabetes Father    Diabetes Paternal Grandfather    Diabetes Paternal Grandmother    Colon cancer Neg Hx    Crohn's disease Neg Hx    Rectal cancer Neg Hx    Stomach cancer Neg Hx    Esophageal cancer Neg Hx     Social History   Socioeconomic History   Marital status: Divorced    Spouse name: Not on file   Number of children: Not on file   Years of education: Not on file   Highest education level: Not on file  Occupational History   Not on file  Tobacco Use   Smoking status: Some Days    Current packs/day: 0.00    Average packs/day: 0.5 packs/day for 30.0 years (15.0 ttl pk-yrs)    Types: Cigarettes, Cigars    Start date: 10/1988    Last attempt to quit: 10/2018    Years since quitting: 4.6   Smokeless tobacco: Never  Vaping Use   Vaping status: Never Used  Substance and Sexual Activity   Alcohol use: Yes    Alcohol/week: 6.0 standard drinks of alcohol    Types: 6 Cans of beer per week  Drug use: No   Sexual activity: Not on file  Other Topics Concern   Not on file  Social History Narrative   Divorced.   2 children, 1 grandchild.   Retired. Once worked as a Financial controller.    Enjoys relaxing.    Social Drivers of Corporate investment banker Strain: Not on file  Food Insecurity: Not on file  Transportation Needs: Not on file  Physical Activity: Not on file  Stress: Not on file  Social Connections: Not on file  Intimate Partner Violence: Not on file   Review of Systems Moving in with girlfriend---they can stay downstairs Now has taken leave of absence from work    Objective:   Physical Exam Constitutional:      Comments: Clear wasting  Cardiovascular:     Rate and Rhythm: Normal rate and regular rhythm.     Pulses: Normal pulses.     Heart sounds: No murmur heard.    No gallop.  Pulmonary:     Effort:  Pulmonary effort is normal.     Breath sounds: Normal breath sounds. No wheezing or rales.  Musculoskeletal:     Cervical back: Neck supple.  Lymphadenopathy:     Cervical: No cervical adenopathy.  Skin:    Comments: No foot lesions  Neurological:     Mental Status: He is alert.            Assessment & Plan:

## 2023-05-29 NOTE — Assessment & Plan Note (Signed)
Actually improved on most recent blood work

## 2023-05-29 NOTE — Progress Notes (Signed)
Denies allergies to eggs or soy products. Denies complication of anesthesia or sedation. Denies use of weight loss medication. Denies use of O2.   Emmi instructions given for colonoscopy.  

## 2023-05-29 NOTE — Assessment & Plan Note (Signed)
BP Readings from Last 3 Encounters:  05/29/23 124/80  05/25/23 (!) 150/97  04/20/23 (!) 147/102   Controlled now on the lisinopril 10mg 

## 2023-05-29 NOTE — Assessment & Plan Note (Signed)
Lab Results  Component Value Date   HGBA1C 10.1 (A) 05/29/2023   May be out of control due to cancer (bony lesions, etc) Will restart glipizide 10 daily Add jardiance 10mg --especially given CKD

## 2023-06-02 ENCOUNTER — Encounter: Payer: Self-pay | Admitting: Internal Medicine

## 2023-06-02 ENCOUNTER — Telehealth: Payer: Self-pay | Admitting: Pulmonary Disease

## 2023-06-02 ENCOUNTER — Telehealth: Payer: Self-pay | Admitting: Physician Assistant

## 2023-06-02 DIAGNOSIS — R918 Other nonspecific abnormal finding of lung field: Secondary | ICD-10-CM | POA: Insufficient documentation

## 2023-06-02 LAB — MULTIPLE MYELOMA PANEL, SERUM
Albumin SerPl Elph-Mcnc: 4.1 g/dL (ref 2.9–4.4)
Albumin/Glob SerPl: 1.4 (ref 0.7–1.7)
Alpha 1: 0.4 g/dL (ref 0.0–0.4)
Alpha2 Glob SerPl Elph-Mcnc: 0.8 g/dL (ref 0.4–1.0)
B-Globulin SerPl Elph-Mcnc: 1.2 g/dL (ref 0.7–1.3)
Gamma Glob SerPl Elph-Mcnc: 0.8 g/dL (ref 0.4–1.8)
Globulin, Total: 3.1 g/dL (ref 2.2–3.9)
IgA: 288 mg/dL (ref 61–437)
IgG (Immunoglobin G), Serum: 1009 mg/dL (ref 603–1613)
IgM (Immunoglobulin M), Srm: 37 mg/dL (ref 15–143)
Total Protein ELP: 7.2 g/dL (ref 6.0–8.5)

## 2023-06-02 NOTE — Telephone Encounter (Signed)
Late entry  I notified Leontine Locket  earlier today by phone regarding CT CAP results. Findings are concerning for small cell lung cancer. Patient will need biopsy to confirm this. Pulmonology has reached out to the patient as well and are planning to see him 06/12/23. I will follow up with patient once biopsy has resulted. All of patient's questions were answered and he expressed understanding of the plan provided.

## 2023-06-02 NOTE — Telephone Encounter (Signed)
PCCM:  Urgent referral for bronchoscopy needed. Appears to have advanced staged disease.   I called and spoke with the patient.  He is agreeable to meet me the day of the procedure on 06/12/2023.  Orders placed.  Josephine Igo, DO  Pulmonary Critical Care 06/02/2023 12:37 PM

## 2023-06-05 ENCOUNTER — Telehealth: Payer: Self-pay | Admitting: Pulmonary Disease

## 2023-06-05 NOTE — Telephone Encounter (Signed)
PT has a biopsy sched 12/20 and wants to know the pre-op information. Pls call @ [phone]

## 2023-06-05 NOTE — Telephone Encounter (Signed)
LM on pt's VM to call me back about this procedure.

## 2023-06-08 ENCOUNTER — Encounter (HOSPITAL_COMMUNITY): Payer: Self-pay | Admitting: Pulmonary Disease

## 2023-06-08 ENCOUNTER — Other Ambulatory Visit: Payer: Self-pay

## 2023-06-08 NOTE — Anesthesia Preprocedure Evaluation (Addendum)
Anesthesia Evaluation  Patient identified by MRN, date of birth, ID band Patient awake    Reviewed: Allergy & Precautions, NPO status , Patient's Chart, lab work & pertinent test results  History of Anesthesia Complications Negative for: history of anesthetic complications  Airway Mallampati: III  TM Distance: >3 FB Neck ROM: Full    Dental  (+) Dental Advisory Given, Edentulous Upper,    Pulmonary neg shortness of breath, neg sleep apnea, neg COPD, neg recent URI, former smoker Lung mass   Pulmonary exam normal breath sounds clear to auscultation       Cardiovascular hypertension (lisinopril), Pt. on medications (-) angina (-) Past MI, (-) Cardiac Stents and (-) CABG + dysrhythmias (prolonged QT, RBBB)  Rhythm:Regular Rate:Normal  HLD  TTE 10/15/2016: Study Conclusions   - Left ventricle: The cavity size was normal. Wall thickness was    normal. Systolic function was normal. The estimated ejection    fraction was in the range of 60% to 65%. Wall motion was normal;    there were no regional wall motion abnormalities. Doppler    parameters are consistent with abnormal left ventricular    relaxation (grade 1 diastolic dysfunction).  - Aortic valve: Valve area (VTI): 3.02 cm^2. Valve area (Vmax):    3.15 cm^2. Valve area (Vmean): 3.23 cm^2.  - Aorta: The visualized portion of the ascending aorta is mildly    dilated at 3.9 cm.  - Mitral valve: There was mild regurgitation.  - Atrial septum: No defect or patent foramen ovale was identified.      Neuro/Psych  Headaches, neg Seizures Concussion syndrome    GI/Hepatic negative GI ROS, Neg liver ROS,,,  Endo/Other  diabetes (Hgb A1c 10.1 (started on medications 05/29/2023)), Poorly Controlled, Type 2, Oral Hypoglycemic Agents, Insulin Dependent    Renal/GU CRFRenal disease     Musculoskeletal   Abdominal   Peds  Hematology negative hematology ROS (+) Lab Results       Component                Value               Date                      WBC                      6.9                 05/25/2023                HGB                      14.0                05/25/2023                HCT                      40.6                05/25/2023                MCV                      92.1                05/25/2023  PLT                      209                 05/25/2023              Anesthesia Other Findings   Reproductive/Obstetrics                             Anesthesia Physical Anesthesia Plan  ASA: 4  Anesthesia Plan: General   Post-op Pain Management: Tylenol PO (pre-op)*   Induction: Intravenous  PONV Risk Score and Plan: 2 and Ondansetron, Dexamethasone, Treatment may vary due to age or medical condition, Propofol infusion and TIVA  Airway Management Planned: Oral ETT  Additional Equipment:   Intra-op Plan:   Post-operative Plan: Extubation in OR  Informed Consent: I have reviewed the patients History and Physical, chart, labs and discussed the procedure including the risks, benefits and alternatives for the proposed anesthesia with the patient or authorized representative who has indicated his/her understanding and acceptance.     Dental advisory given  Plan Discussed with: CRNA and Anesthesiologist  Anesthesia Plan Comments: (PAT note written 06/08/2023 by Shonna Chock, PA-C.  Risks of general anesthesia discussed including, but not limited to, sore throat, hoarse voice, chipped/damaged teeth, injury to vocal cords, nausea and vomiting, allergic reactions, lung infection, heart attack, stroke, and death. All questions answered.   )       Anesthesia Quick Evaluation

## 2023-06-08 NOTE — Progress Notes (Signed)
Anesthesia Chart Review: SAME DAY WORK-UP  Case: [id number] Date/Time: 06/12/23 0730   Procedure: VIDEO BRONCHOSCOPY WITH ENDOBRONCHIAL ULTRASOUND (Bilateral)   Anesthesia type: General   Diagnosis: Lung mass [R91.8]   Pre-op diagnosis: Lung mass   Location: [facility] / [facility]   Surgeons: Josephine Igo, DO       DISCUSSION: Patient is a 72 year old male scheduled for the above procedure.  History includes former smoker (quit 10/12/18), HTN, DM2, post-concussion syndrome (2011), CKD, cancer (concern for lung cancer with metastatic disease, biopsy pending).   ED visit 04/20/2023 for a few days of blood in stools as well as progressive proximal leg pain and weakness with walking over several months. Had lost > 30 lbs over 6 months, some intentional and unintentional loss. HGB 13.9. Creatinine stable at 1.36. VSS. Started on gabapentin and recommended out-patient GI and PCP follow-up. Endoscopy visit with Erick Blinks, MD is currently scheduled for 06/30/23.  He had ortho evaluation by Ellin Goodie, NP on 04/24/23. Lumbar xray showed mild scoliotic curvature, grade 1 anterolisthesis of L4-5, multi-level degenerative changes. L-spine MRI ordered and done on 05/10/23 showing widespread bony metastasis with dominant deposit on the right at L4 and L5 with extraosseous tumor extension involving the right L4-5 foramen, epidural space, and the adjacent right lumbar plexus. Metastatic work-up recommended. APP referred him to oncology and evaluated on 05/25/23 by Namon Cirri, PA. CT chest/abd/pelvis ordered and done on 05/29/23. Results included soft tissue density LUL perihilar through the hilum and into the left mediastinum associated with occlusion of the LUL bronchus as well as causing significant narrowing or occlusion of the pulmonary vasculature. There were also scattered bilateral lung nodules and abnormal lymph nodes in the left thoracic inlet and scattered lytic bone  metastases along the spine, ribs and proximal left femur. He was referred urgently to pulmonology for bronchoscopy due to concern for small cell lung cancer.   A1c 10.1% on 05/29/23 per oncology labs. He had reported DM medications stopped years ago due to better glucose control , and was referred back to PCP. Jardiance 10 mg and glipizide 10 mg were prescribed at 05/29/23 primary care visit with Dr. Tillman Abide. He  said he was also recently started on NPH 70/30 5 units BID. He reported fasting CBGs currently ~ 130s'140's. Last Jardiance before surgery is scheduled for 06/08/23.    He is a same day work-up.  Known chronic RBBB. EKG from recent ED visit overall stable. He denied chest pain and SOB per PAT phone RN interview. He says he stopped ASA months ago.  Anesthesia team to evaluate on the day of surgery.     VS: Ht 6\' 1"  (1.854 m)   Wt 72.6 kg   BMI 21.12 kg/m   Wt Readings from Last 3 Encounters:  05/29/23 72.6 kg  05/29/23 71.2 kg  05/25/23 73.2 kg   Temp Readings from Last 3 Encounters:  05/29/23 36.5 C (Oral)  05/25/23 36.8 C (Temporal)  04/20/23 36.5 C (Oral)   BP Readings from Last 3 Encounters:  05/29/23 124/80  05/25/23 (!) 150/97  04/20/23 (!) 147/102   Pulse Readings from Last 3 Encounters:  05/29/23 95  05/25/23 92  04/20/23 95     PROVIDERS: Doreene Nest, NP is PCP  Namon Cirri, PA-C is HEM-ONC provider. Visit 05/25/23 at the [facility] at [facility].    LABS: For day of surgery as indicated. Last results in [facility] include: Lab Results  Component Value  Date   WBC 6.9 05/25/2023   HGB 14.0 05/25/2023   HCT 40.6 05/25/2023   PLT 209 05/25/2023   GLUCOSE 437 (H) 05/25/2023   ALT 11 05/25/2023   AST 12 (L) 05/25/2023   NA 136 05/25/2023   K 4.0 05/25/2023   CL 98 05/25/2023   CREATININE 1.32 (H) 05/25/2023   BUN 15 05/25/2023   CO2 31 05/25/2023   PSA 3.29 12/21/2022   HGBA1C 10.1 (A) 05/29/2023    IMAGES: CT  Chest/abd/pelvis 05/29/23: IMPRESSION: - Confluent abnormal soft tissue identified extending from the left upper lobe perihilar through the hilum and into the left side of the mediastinum with the associated occlusion of the left upper lobe bronchus adjacent to this area extending superiorly as well as significant narrowing or occlusion of the pulmonary vasculature. - Additional reticulonodular areas extending and confluence fashion along the lingula which could represent additional spread of disease including lymphangitic spread. There scattered bilateral small lung nodules also seen, left-greater-than-right. - Abnormal lymph nodes seen left thoracic inlet. - Lytic scattered bone metastases identified with moth-eaten areas which include along the spine, ribs and proximal left femur. There is what may be a pathologic fracture of the left first rib. Some surrounding soft tissue. - Bilateral nonobstructing renal stones. Numerous bilateral benign-appearing renal cystic foci. Nonobstructing renal stones. - Nonspecific thickening of the adrenal glands. Attention on follow-up. - Tiny liver lesion is too small to completely characterize. Simple attention on follow-up.    MRI L-spine 05/10/23: IMPRESSION: Findings of widespread bony metastasis with dominant deposit on the right at L4 and L5 where there is extraosseous tumor extension involving the right L4-5 foramen, epidural space, and the adjacent right lumbar plexus. Recommend postcontrast assessment and metastatic workup.    EKG: 04/20/23: Sinus tachycardia at 106 bpm Right atrial enlargement Right bundle branch block Inferior infarct , age undetermined Anterior infarct , age undetermined Abnormal ECG When compared with ECG of 16-Oct-2016 11:02, Since prior ECG< rate has increased Confirmed by Alvira Monday ([id number]) on 04/20/2023 7:28:04 PM - RBBB, possible inferior infarct pattern dating back to 12/28/10.    CV: Echo  10/15/16: Study Conclusions  - Left ventricle: The cavity size was normal. Wall thickness was    normal. Systolic function was normal. The estimated ejection    fraction was in the range of 60% to 65%. Wall motion was normal;    there were no regional wall motion abnormalities. Doppler    parameters are consistent with abnormal left ventricular    relaxation (grade 1 diastolic dysfunction).  - Aortic valve: Valve area (VTI): 3.02 cm^2. Valve area (Vmax):    3.15 cm^2. Valve area (Vmean): 3.23 cm^2.  - Aorta: The visualized portion of the ascending aorta is mildly    dilated at 3.9 cm.  - Mitral valve: There was mild regurgitation.  - Atrial septum: No defect or patent foramen ovale was identified.    Past Medical History:  Diagnosis Date   Allergy    Cancer (HCC)    Cellulitis 02/21/2018   Concussion syndrome    Diabetes (HCC)    Frequent headaches    Hypertension     Past Surgical History:  Procedure Laterality Date   APPENDECTOMY  1960   COLONOSCOPY     WISDOM TOOTH EXTRACTION      MEDICATIONS: No current facility-administered medications for this encounter.    acetaminophen (TYLENOL) 500 MG tablet   empagliflozin (JARDIANCE) 10 MG TABS tablet   gabapentin (NEURONTIN) 300 MG capsule  glipiZIDE (GLUCOTROL XL) 10 MG 24 hr tablet   insulin NPH-regular Human (70-30) 100 UNIT/ML injection   lisinopril (ZESTRIL) 10 MG tablet   pravastatin (PRAVACHOL) 40 MG tablet   aspirin EC 81 MG tablet   Blood Glucose Monitoring Suppl (ACCU-CHEK AVIVA PLUS) w/Device KIT   glucose blood (ACCU-CHEK AVIVA PLUS) test strip   Lancets (ACCU-CHEK SOFT TOUCH) lancets    Shonna Chock, PA-C Surgical Short Stay/Anesthesiology [facility] Phone [phone] [facility] Phone [phone] 06/08/2023 2:18 PM

## 2023-06-08 NOTE — Progress Notes (Addendum)
PCP - Doreene Nest, NP  Cardiologist -   PPM/ICD - denies Device Orders - n/a Rep Notified - n/a  Chest x-ray -  Chest CT 05-29-23 EKG - 04-20-23 Stress Test -  ECHO - 10-15-16 Cardiac Cath -   CPAP - denies Sleep test 2012 GLP-1 -  Fasting Blood Sugar - 130-140  Checks Blood Sugar per patient BID  Blood Thinner Instructions: denies Aspirin Instructions: aspirin EC 81 per patient stop taking months ago  ERAS Protcol - NPO  COVID TEST- n/a  Anesthesia review: Yes CKD, HTN, DM   Patient verbally denies any shortness of breath, fever, cough and chest pain during phone call   -------------  SDW INSTRUCTIONS given:  Your procedure is scheduled on June 12, 2023.  Report to [facility] Main Entrance "A" at 5:30 A.M., and check in at the Admitting office.  Call this number if you have problems the morning of surgery:  [phone]   Remember:  Do not eat or drink after midnight the night before your surgery     Take these medicines the morning of surgery with A SIP OF WATER  pravastatin (PRAVACHOL)   IF NEEDED acetaminophen (TYLENOL)    As of today, STOP taking any Aspirin (unless otherwise instructed by your surgeon) Aleve, Naproxen, Ibuprofen, Motrin, Advil, Goody's, BC's, all herbal medications, fish oil, and all vitamins.        WHAT DO I DO ABOUT MY DIABETES MEDICATION?   Do not take oral diabetes medicines glipiZIDE (GLUCOTROL XL) empagliflozin (JARDIANCE)  LAST DOSE : 06-08-23  the morning of surgery.  Novolog 70/30 3 units of insulin night before and non morning of procedure  The day of surgery, do not take other diabetes injectables, including Byetta (exenatide), Bydureon (exenatide ER), Victoza (liraglutide), or Trulicity (dulaglutide).  If your CBG is greater than 220 mg/dL, you may take  of your sliding scale (correction) dose of insulin.   HOW TO MANAGE YOUR DIABETES BEFORE AND AFTER SURGERY  Why is it important to control my blood  sugar before and after surgery? Improving blood sugar levels before and after surgery helps healing and can limit problems. A way of improving blood sugar control is eating a healthy diet by:  Eating less sugar and carbohydrates  Increasing activity/exercise  Talking with your doctor about reaching your blood sugar goals High blood sugars (greater than 180 mg/dL) can raise your risk of infections and slow your recovery, so you will need to focus on controlling your diabetes during the weeks before surgery. Make sure that the doctor who takes care of your diabetes knows about your planned surgery including the date and location.  How do I manage my blood sugar before surgery? Check your blood sugar at least 4 times a day, starting 2 days before surgery, to make sure that the level is not too high or low.  Check your blood sugar the morning of your surgery when you wake up and every 2 hours until you get to the Short Stay unit.  If your blood sugar is less than 70 mg/dL, you will need to treat for low blood sugar: Do not take insulin. Treat a low blood sugar (less than 70 mg/dL) with  cup of clear juice (cranberry or apple), 4 glucose tablets, OR glucose gel. Recheck blood sugar in 15 minutes after treatment (to make sure it is greater than 70 mg/dL). If your blood sugar is not greater than 70 mg/dL on recheck, call [phone] for further instructions.  Report your blood sugar to the short stay nurse when you get to Short Stay.  If you are admitted to the hospital after surgery: Your blood sugar will be checked by the staff and you will probably be given insulin after surgery (instead of oral diabetes medicines) to make sure you have good blood sugar levels. The goal for blood sugar control after surgery is 80-180 mg/dL.             Do not wear jewelry, make up, or nail polish            Do not wear lotions, powders, perfumes/colognes, or deodorant.            Do not shave 48 hours prior to  surgery.  Men may shave face and neck.            Do not bring valuables to the hospital.            [facility] is not responsible for any belongings or valuables.  Do NOT Smoke (Tobacco/Vaping) 24 hours prior to your procedure If you use a CPAP at night, you may bring all equipment for your overnight stay.   Contacts, glasses, dentures or bridgework may not be worn into surgery.      For patients admitted to the hospital, discharge time will be determined by your treatment team.   Patients discharged the day of surgery will not be allowed to drive home, and someone needs to stay with them for 24 hours.    Special instructions:   [place]- Preparing For Surgery  Before surgery, you can play an important role. Because skin is not sterile, your skin needs to be as free of germs as possible. You can reduce the number of germs on your skin by washing with CHG (chlorahexidine gluconate) Soap before surgery.  CHG is an antiseptic cleaner which kills germs and bonds with the skin to continue killing germs even after washing.    Oral Hygiene is also important to reduce your risk of infection.  Remember - BRUSH YOUR TEETH THE MORNING OF SURGERY WITH YOUR REGULAR TOOTHPASTE  Please do not use if you have an allergy to CHG or antibacterial soaps. If your skin becomes reddened/irritated stop using the CHG.  Do not shave (including legs and underarms) for at least 48 hours prior to first CHG shower. It is OK to shave your face.  Please follow these instructions carefully.   Shower the NIGHT BEFORE SURGERY and the MORNING OF SURGERY with DIAL Soap.   Pat yourself dry with a CLEAN TOWEL.  Wear CLEAN PAJAMAS to bed the night before surgery  Place CLEAN SHEETS on your bed the night of your first shower and DO NOT SLEEP WITH PETS.   Day of Surgery: Please shower morning of surgery  Wear Clean/Comfortable clothing the morning of surgery Do not apply any deodorants/lotions.   Remember to  brush your teeth WITH YOUR REGULAR TOOTHPASTE.   Questions were answered. Patient verbalized understanding of instructions.

## 2023-06-09 NOTE — Telephone Encounter (Signed)
Closing. Procedure completed. NFN

## 2023-06-12 ENCOUNTER — Other Ambulatory Visit: Payer: Self-pay

## 2023-06-12 ENCOUNTER — Ambulatory Visit (HOSPITAL_BASED_OUTPATIENT_CLINIC_OR_DEPARTMENT_OTHER): Payer: Medicare HMO | Admitting: Vascular Surgery

## 2023-06-12 ENCOUNTER — Encounter (HOSPITAL_COMMUNITY): Payer: Self-pay | Admitting: Pulmonary Disease

## 2023-06-12 ENCOUNTER — Encounter (HOSPITAL_COMMUNITY)
Admission: RE | Disposition: A | Discharge status: Home / Self Care | Payer: Self-pay | Source: Home / Self Care | Attending: Pulmonary Disease

## 2023-06-12 ENCOUNTER — Ambulatory Visit (HOSPITAL_COMMUNITY)
Admission: RE | Admit: 2023-06-12 | Discharge: 2023-06-12 | Disposition: A | Discharge status: Home / Self Care | Payer: Medicare HMO | Attending: Pulmonary Disease | Admitting: Pulmonary Disease

## 2023-06-12 ENCOUNTER — Encounter: Payer: Medicare HMO | Admitting: Internal Medicine

## 2023-06-12 ENCOUNTER — Ambulatory Visit (HOSPITAL_COMMUNITY): Payer: Medicare HMO | Admitting: Vascular Surgery

## 2023-06-12 DIAGNOSIS — C3492 Malignant neoplasm of unspecified part of left bronchus or lung: Secondary | ICD-10-CM | POA: Diagnosis not present

## 2023-06-12 DIAGNOSIS — I10 Essential (primary) hypertension: Secondary | ICD-10-CM

## 2023-06-12 DIAGNOSIS — Z794 Long term (current) use of insulin: Secondary | ICD-10-CM | POA: Diagnosis not present

## 2023-06-12 DIAGNOSIS — I129 Hypertensive chronic kidney disease with stage 1 through stage 4 chronic kidney disease, or unspecified chronic kidney disease: Secondary | ICD-10-CM | POA: Diagnosis not present

## 2023-06-12 DIAGNOSIS — Z79899 Other long term (current) drug therapy: Secondary | ICD-10-CM | POA: Diagnosis not present

## 2023-06-12 DIAGNOSIS — N189 Chronic kidney disease, unspecified: Secondary | ICD-10-CM | POA: Diagnosis not present

## 2023-06-12 DIAGNOSIS — R918 Other nonspecific abnormal finding of lung field: Secondary | ICD-10-CM | POA: Diagnosis not present

## 2023-06-12 DIAGNOSIS — E119 Type 2 diabetes mellitus without complications: Secondary | ICD-10-CM

## 2023-06-12 DIAGNOSIS — E1122 Type 2 diabetes mellitus with diabetic chronic kidney disease: Secondary | ICD-10-CM | POA: Diagnosis not present

## 2023-06-12 DIAGNOSIS — Z87891 Personal history of nicotine dependence: Secondary | ICD-10-CM | POA: Diagnosis not present

## 2023-06-12 DIAGNOSIS — C3402 Malignant neoplasm of left main bronchus: Secondary | ICD-10-CM | POA: Diagnosis not present

## 2023-06-12 DIAGNOSIS — Z01818 Encounter for other preprocedural examination: Secondary | ICD-10-CM

## 2023-06-12 DIAGNOSIS — C3490 Malignant neoplasm of unspecified part of unspecified bronchus or lung: Secondary | ICD-10-CM | POA: Diagnosis not present

## 2023-06-12 DIAGNOSIS — C7951 Secondary malignant neoplasm of bone: Secondary | ICD-10-CM | POA: Diagnosis not present

## 2023-06-12 DIAGNOSIS — C342 Malignant neoplasm of middle lobe, bronchus or lung: Secondary | ICD-10-CM | POA: Diagnosis not present

## 2023-06-12 DIAGNOSIS — E785 Hyperlipidemia, unspecified: Secondary | ICD-10-CM

## 2023-06-12 DIAGNOSIS — E1165 Type 2 diabetes mellitus with hyperglycemia: Secondary | ICD-10-CM | POA: Insufficient documentation

## 2023-06-12 HISTORY — DX: Malignant (primary) neoplasm, unspecified: C80.1

## 2023-06-12 HISTORY — PX: BRONCHIAL BIOPSY: SHX5109

## 2023-06-12 HISTORY — PX: BRONCHIAL BRUSHINGS: SHX5108

## 2023-06-12 HISTORY — PX: VIDEO BRONCHOSCOPY: SHX5072

## 2023-06-12 LAB — GLUCOSE, CAPILLARY
Glucose-Capillary: 337 mg/dL — ABNORMAL HIGH (ref 70–99)
Glucose-Capillary: 336 mg/dL — ABNORMAL HIGH (ref 70–99)
Glucose-Capillary: 323 mg/dL — ABNORMAL HIGH (ref 70–99)

## 2023-06-12 SURGERY — VIDEO BRONCHOSCOPY
Anesthesia: General | Laterality: Bilateral

## 2023-06-12 MED ORDER — ACETAMINOPHEN 500 MG PO TABS
1000.0000 mg | ORAL_TABLET | Freq: Once | ORAL | Status: AC
  Administered 2023-06-12: 1000 mg via ORAL
  Filled 2023-06-12: qty 2

## 2023-06-12 MED ORDER — OXYCODONE HCL 5 MG PO TABS: 5.0000 mg | ORAL_TABLET | Freq: Once | ORAL | Status: DC | PRN

## 2023-06-12 MED ORDER — INSULIN ASPART 100 UNIT/ML IJ SOLN
0.0000 [IU] | INTRAMUSCULAR | Status: DC | PRN
  Administered 2023-06-12: 5 [IU] via SUBCUTANEOUS
  Filled 2023-06-12: qty 0.07
  Filled 2023-06-12: qty 1

## 2023-06-12 MED ORDER — PROPOFOL 10 MG/ML IV BOLUS
INTRAVENOUS | Status: DC | PRN
  Administered 2023-06-12: 150 mg via INTRAVENOUS

## 2023-06-12 MED ORDER — INSULIN ASPART 100 UNIT/ML IJ SOLN
5.0000 [IU] | Freq: Once | INTRAMUSCULAR | Status: AC
  Administered 2023-06-12: 5 [IU] via SUBCUTANEOUS

## 2023-06-12 MED ORDER — SUGAMMADEX SODIUM 200 MG/2ML IV SOLN
INTRAVENOUS | Status: DC | PRN
  Administered 2023-06-12: 200 mg via INTRAVENOUS

## 2023-06-12 MED ORDER — ROCURONIUM BROMIDE 10 MG/ML (PF) SYRINGE
PREFILLED_SYRINGE | INTRAVENOUS | Status: DC | PRN
  Administered 2023-06-12: 50 mg via INTRAVENOUS

## 2023-06-12 MED ORDER — SODIUM CHLORIDE 0.9 % IV SOLN: INTRAVENOUS | Status: DC | PRN

## 2023-06-12 MED ORDER — EPINEPHRINE 1 MG/10ML IJ SOSY
PREFILLED_SYRINGE | INTRAMUSCULAR | Status: AC
  Filled 2023-06-12: qty 10

## 2023-06-12 MED ORDER — PHENYLEPHRINE HCL (PRESSORS) 10 MG/ML IV SOLN
INTRAVENOUS | Status: DC | PRN
  Administered 2023-06-12 (×3): 160 ug via INTRAVENOUS

## 2023-06-12 MED ORDER — ONDANSETRON HCL 4 MG/2ML IJ SOLN
INTRAMUSCULAR | Status: DC | PRN
  Administered 2023-06-12: 4 mg via INTRAVENOUS

## 2023-06-12 MED ORDER — LACTATED RINGERS IV SOLN: INTRAVENOUS | Status: DC

## 2023-06-12 MED ORDER — LIDOCAINE 2% (20 MG/ML) 5 ML SYRINGE
INTRAMUSCULAR | Status: DC | PRN
  Administered 2023-06-12: 80 mg via INTRAVENOUS

## 2023-06-12 MED ORDER — SODIUM CHLORIDE (PF) 0.9 % IJ SOLN
PREFILLED_SYRINGE | INTRAMUSCULAR | Status: DC | PRN
  Administered 2023-06-12: 10 mL

## 2023-06-12 MED ORDER — CHLORHEXIDINE GLUCONATE 0.12 % MT SOLN: 15.0000 mL | Freq: Once | OROMUCOSAL | Status: AC

## 2023-06-12 MED ORDER — AMISULPRIDE (ANTIEMETIC) 5 MG/2ML IV SOLN
10.0000 mg | Freq: Once | INTRAVENOUS | Status: DC | PRN
Start: 2023-06-12 — End: 2023-06-12

## 2023-06-12 MED ORDER — CHLORHEXIDINE GLUCONATE 0.12 % MT SOLN
OROMUCOSAL | Status: AC
  Administered 2023-06-12: 15 mL via OROMUCOSAL
  Filled 2023-06-12: qty 15

## 2023-06-12 MED ORDER — PHENYLEPHRINE HCL-NACL 20-0.9 MG/250ML-% IV SOLN
INTRAVENOUS | Status: DC | PRN
  Administered 2023-06-12: 50 ug/min via INTRAVENOUS

## 2023-06-12 MED ORDER — FENTANYL CITRATE (PF) 100 MCG/2ML IJ SOLN: 25.0000 ug | INTRAMUSCULAR | Status: DC | PRN

## 2023-06-12 MED ORDER — PROPOFOL 500 MG/50ML IV EMUL
INTRAVENOUS | Status: DC | PRN
  Administered 2023-06-12: 200 ug/kg/min via INTRAVENOUS

## 2023-06-12 MED ORDER — OXYCODONE HCL 5 MG/5ML PO SOLN: 5.0000 mg | Freq: Once | ORAL | Status: DC | PRN

## 2023-06-12 SURGICAL SUPPLY — 27 items
BRUSH CYTOL CELLEBRITY 1.5X140 (MISCELLANEOUS)
CANISTER SUCT 3000ML PPV (MISCELLANEOUS) ×2
CONT SPEC 4OZ CLIKSEAL STRL BL (MISCELLANEOUS) ×2
COVER BACK TABLE 60X90IN (DRAPES) ×2
COVER DOME SNAP 22 D (MISCELLANEOUS) ×2
FORCEPS BIOP RJ4 1.8 (CUTTING FORCEPS)
GAUZE SPONGE 4X4 12PLY STRL (GAUZE/BANDAGES/DRESSINGS) ×2
GLOVE BIO SURGEON STRL SZ7.5 (GLOVE) ×2
GOWN STRL REUS W/ TWL LRG LVL3 (GOWN DISPOSABLE) ×2
KIT CLEAN ENDO COMPLIANCE (KITS) ×4
KIT TURNOVER KIT B (KITS) ×2
MARKER SKIN DUAL TIP RULER LAB (MISCELLANEOUS) ×2
NEEDLE EBUS SONO TIP PENTAX (NEEDLE) ×2
NS IRRIG 1000ML POUR BTL (IV SOLUTION) ×2
OIL SILICONE PENTAX (PARTS (SERVICE/REPAIRS)) ×2
PAD ARMBOARD 7.5X6 YLW CONV (MISCELLANEOUS) ×4
SOL ANTI FOG 6CC (MISCELLANEOUS) ×2
SYR 20CC LL (SYRINGE) ×4
SYR 20ML ECCENTRIC (SYRINGE) ×4
SYR 50ML SLIP (SYRINGE)
SYR 5ML LUER SLIP (SYRINGE) ×2
TOWEL OR 17X24 6PK STRL BLUE (TOWEL DISPOSABLE) ×2
TRAP SPECIMEN MUCOUS 40CC (MISCELLANEOUS)
TUBE CONNECTING 20X1/4 (TUBING) ×4
UNDERPAD 30X30 (UNDERPADS AND DIAPERS) ×2
VALVE DISPOSABLE (MISCELLANEOUS) ×2
WATER STERILE IRR 1000ML POUR (IV SOLUTION) ×2

## 2023-06-12 NOTE — Anesthesia Postprocedure Evaluation (Signed)
Anesthesia Post Note  Patient: Jonathan Page  Procedure(s) Performed: VIDEO BRONCHOSCOPY (Bilateral) BRONCHIAL BRUSHINGS BRONCHIAL BIOPSIES     Patient location during evaluation: PACU Anesthesia Type: General Level of consciousness: awake Pain management: pain level controlled Vital Signs Assessment: post-procedure vital signs reviewed and stable Respiratory status: spontaneous breathing, nonlabored ventilation and respiratory function stable Cardiovascular status: blood pressure returned to baseline and stable Postop Assessment: no apparent nausea or vomiting Anesthetic complications: no   No notable events documented.  Last Vitals:  Vitals:   06/12/23 0845 06/12/23 0900  BP: 94/69 117/68  Pulse: 94 94  Resp: (!) 22 (!) 21  Temp:  36.5 C  SpO2: 97% 97%    Last Pain:  Vitals:   06/12/23 0900  TempSrc:   PainSc: 0-No pain                 Linton Rump

## 2023-06-12 NOTE — Transfer of Care (Addendum)
Immediate Anesthesia Transfer of Care Note  Patient: Jonathan Page  Procedure(s) Performed: VIDEO BRONCHOSCOPY (Bilateral) BRONCHIAL BRUSHINGS BRONCHIAL BIOPSIES  Patient Location: PACU  Anesthesia Type:General  Level of Consciousness: awake, alert , and oriented  Airway & Oxygen Therapy: Patient Spontanous Breathing and Patient connected to face mask oxygen  Post-op Assessment: Report given to RN and Post -op Vital signs reviewed and stable  Post vital signs: Reviewed and stable  Last Vitals:  Vitals Value Taken Time  BP 99/65 06/12/23 0833  Temp 36.1 C 06/12/23 0833  Pulse 95 06/12/23 0834  Resp 21 06/12/23 0834  SpO2 96 % 06/12/23 0834  Vitals shown include unfiled device data.  Last Pain:  Vitals:   06/12/23 0833  TempSrc: Tympanic  PainSc:       Patients Stated Pain Goal: 2 (06/12/23 0654)  Complications: No notable events documented.

## 2023-06-12 NOTE — Anesthesia Procedure Notes (Signed)
Procedure Name: Intubation Date/Time: 06/12/2023 7:46 AM  Performed by: Marquis Buggy, CRNAPre-anesthesia Checklist: Patient identified, Emergency Drugs available, Suction available, Patient being monitored and Timeout performed Patient Re-evaluated:Patient Re-evaluated prior to induction Oxygen Delivery Method: Circle system utilized Preoxygenation: Pre-oxygenation with 100% oxygen Induction Type: IV induction Ventilation: Mask ventilation without difficulty Laryngoscope Size: Mac and 4 Grade View: Grade I Tube type: Oral Tube size: 8.5 mm Number of attempts: 1 Airway Equipment and Method: Stylet Placement Confirmation: ETT inserted through vocal cords under direct vision, positive ETCO2, CO2 detector and breath sounds checked- equal and bilateral Secured at: 22 cm Dental Injury: Teeth and Oropharynx as per pre-operative assessment

## 2023-06-12 NOTE — Discharge Instructions (Signed)
Flexible Bronchoscopy, Care After This sheet gives you information about how to care for yourself after your test. Your doctor may also give you more specific instructions. If you have problems or questions, contact your doctor. Follow these instructions at home: Eating and drinking Do not eat or drink anything (not even water) for 2 hours after your test, or until your numbing medicine (local anesthetic) wears off. When your numbness is gone and your cough and gag reflexes have come back, you may: Eat only soft foods. Slowly drink liquids. The day after the test, go back to your normal diet. Driving Do not drive for 24 hours if you were given a medicine to help you relax (sedative). Do not drive or use heavy machinery while taking prescription pain medicine. General instructions  Take over-the-counter and prescription medicines only as told by your doctor. Return to your normal activities as told. Ask what activities are safe for you. Do not use any products that have nicotine or tobacco in them. This includes cigarettes and e-cigarettes. If you need help quitting, ask your doctor. Keep all follow-up visits as told by your doctor. This is important. It is very important if you had a tissue sample (biopsy) taken. Get help right away if: You have shortness of breath that gets worse. You get light-headed. You feel like you are going to pass out (faint). You have chest pain. You cough up: More than a little blood. More blood than before. Summary Do not eat or drink anything (not even water) for 2 hours after your test, or until your numbing medicine wears off. Do not use cigarettes. Do not use e-cigarettes. Get help right away if you have chest pain.  This information is not intended to replace advice given to you by your health care provider. Make sure you discuss any questions you have with your health care provider. Document Released: 03/27/2009 Document Revised: 05/12/2017 Document  Reviewed: 06/17/2016 Elsevier Patient Education  2020 Elsevier Inc.  

## 2023-06-12 NOTE — Op Note (Signed)
Video Bronchoscopy Procedure Note  Date of Operation: 06/12/2023  Pre-op Diagnosis: Lung mass  Post-op Diagnosis: Lung mass  Surgeon: Josephine Igo, DO  Assistants: none  Anesthesia: general  Operation: Flexible video fiberoptic bronchoscopy and biopsies.  Estimated Blood Loss: <1 cc  Complications: none noted  Indications and History: Jonathan Page is 72 y.o. with history of lung mass.  Recommendation was to perform video fiberoptic bronchoscopy with biopsies. The risks, benefits, complications, treatment options and expected outcomes were discussed with the patient.  The possibilities of pneumothorax, pneumonia, reaction to medication, pulmonary aspiration, perforation of a viscus, bleeding, failure to diagnose a condition and creating a complication requiring transfusion or operation were discussed with the patient who freely signed the consent.    Description of Procedure: The patient was seen in the Preoperative Area, was examined and was deemed appropriate to proceed.  The patient was taken to [facility] Endo room 3, identified as Jonathan Page and the procedure verified as Flexible Video Fiberoptic Bronchoscopy.  A Time Out was held and the above information confirmed.   Standard bronchoscope was inserted into the patient's airway and the bilateral mainstem's were examined.  The right lung appears normal in its distal subsegments.  The left lung has a visible endobronchial tumor eroding through the proximal wall.  In the left mainstem both distal subsegment openings were patent.  Using the standard cytology brush specimen was obtained.  Also then switching to the [facility] Scientific 2 mm forceps biopsies were obtained endobronchially of the visible tumor.  Samples: 1. Endobronchial brushings from left mainstem 2.  Endobronchial forceps biopsies from the left mainstem  Plans:  We will review the cytology, pathology results with the patient when they become available.  Outpatient  followup will be with Dr. Nira Retort, DO  Pulmonary Critical Care 06/12/2023 8:19 AM

## 2023-06-12 NOTE — H&P (Addendum)
Synopsis: Referred in December 2024 for lung mass by No ref. provider found  Subjective:   PATIENT ID: Jonathan Page GENDER: male DOB: [birth year], MRN: [id number]  No chief complaint on file.   72 YO M, presents with abnormal CT chest, concerning for lung mass. PMH of a DMII and HTN. CT concerning for cancer. The patient is here today for bronchoscopy and biopsy     Past Medical History:  Diagnosis Date   Allergy    Cancer (HCC)    Cellulitis 02/21/2018   Concussion syndrome    Diabetes (HCC)    Frequent headaches    Hypertension      Family History  Problem Relation Age of Onset   Diabetes Father    Diabetes Paternal Grandfather    Diabetes Paternal Grandmother    Colon cancer Neg Hx    Crohn's disease Neg Hx    Rectal cancer Neg Hx    Stomach cancer Neg Hx    Esophageal cancer Neg Hx      Past Surgical History:  Procedure Laterality Date   APPENDECTOMY  1960   COLONOSCOPY     WISDOM TOOTH EXTRACTION      Social History   Socioeconomic History   Marital status: Divorced    Spouse name: Not on file   Number of children: Not on file   Years of education: Not on file   Highest education level: Not on file  Occupational History   Not on file  Tobacco Use   Smoking status: Former    Current packs/day: 0.00    Average packs/day: 0.5 packs/day for 30.0 years (15.0 ttl pk-yrs)    Types: Cigarettes, Cigars    Start date: 10/1988    Quit date: 10/2018    Years since quitting: 4.6   Smokeless tobacco: Never  Vaping Use   Vaping status: Never Used  Substance and Sexual Activity   Alcohol use: Yes    Alcohol/week: 6.0 standard drinks of alcohol    Types: 6 Cans of beer per week    Comment: occasional   Drug use: No   Sexual activity: Not on file  Other Topics Concern   Not on file  Social History Narrative   Divorced.   2 children, 1 grandchild.   Retired. Once worked as a Financial controller.    Enjoys relaxing.    Social Drivers of Manufacturing engineer Strain: Not on file  Food Insecurity: Not on file  Transportation Needs: Not on file  Physical Activity: Not on file  Stress: Not on file  Social Connections: Not on file  Intimate Partner Violence: Not on file     No Known Allergies   @ENCMEDSTART @  Review of Systems  Constitutional:  Positive for malaise/fatigue and weight loss. Negative for chills and fever.  HENT:  Negative for hearing loss, sore throat and tinnitus.   Eyes:  Negative for blurred vision and double vision.  Respiratory:  Positive for cough. Negative for hemoptysis, sputum production, shortness of breath, wheezing and stridor.   Cardiovascular:  Negative for chest pain, palpitations, orthopnea, leg swelling and PND.  Gastrointestinal:  Negative for abdominal pain, constipation, diarrhea, heartburn, nausea and vomiting.  Genitourinary:  Negative for dysuria, hematuria and urgency.  Musculoskeletal:  Negative for joint pain and myalgias.  Skin:  Negative for itching and rash.  Neurological:  Negative for dizziness, tingling, weakness and headaches.  Endo/Heme/Allergies:  Negative for environmental allergies. Does not bruise/bleed easily.  Psychiatric/Behavioral:  Negative for depression. The patient is not nervous/anxious and does not have insomnia.   All other systems reviewed and are negative.    Objective:  Physical Exam Vitals reviewed.  Constitutional:      General: He is not in acute distress.    Appearance: He is well-developed.  HENT:     Head: Normocephalic and atraumatic.     Mouth/Throat:     Pharynx: No oropharyngeal exudate.  Eyes:     Conjunctiva/sclera: Conjunctivae normal.     Pupils: Pupils are equal, round, and reactive to light.  Neck:     Vascular: No JVD.     Trachea: No tracheal deviation.     Comments: Loss of supraclavicular fat Cardiovascular:     Rate and Rhythm: Normal rate and regular rhythm.     Heart sounds: S1 normal and S2 normal.     Comments:  Distant heart tones Pulmonary:     Effort: No tachypnea or accessory muscle usage.     Breath sounds: No stridor. Decreased breath sounds (throughout all lung fields) present. No wheezing, rhonchi or rales.  Abdominal:     General: Bowel sounds are normal. There is no distension.     Palpations: Abdomen is soft.     Tenderness: There is no abdominal tenderness.  Musculoskeletal:        General: Deformity (muscle wasting ) present.  Skin:    General: Skin is warm and dry.     Capillary Refill: Capillary refill takes less than 2 seconds.     Findings: No rash.  Neurological:     Mental Status: He is alert and oriented to person, place, and time.  Psychiatric:        Behavior: Behavior normal.      Vitals:   06/08/23 1246 06/12/23 0618  BP:  134/89  Pulse:  (!) 103  Resp:  18  Temp:  97.8 F (36.6 C)  TempSrc:  Oral  SpO2:  99%  Weight: 72.6 kg 70.3 kg  Height: 6\' 1"  (1.854 m) 6\' 1"  (1.854 m)   99% on RA BMI Readings from Last 3 Encounters:  06/12/23 20.45 kg/m  05/29/23 21.11 kg/m  05/29/23 21.90 kg/m   Wt Readings from Last 3 Encounters:  06/12/23 70.3 kg  05/29/23 72.6 kg  05/29/23 71.2 kg     CBC    Component Value Date/Time   WBC 6.9 05/25/2023 1327   WBC 8.3 04/20/2023 1522   RBC 4.41 05/25/2023 1327   HGB 14.0 05/25/2023 1327   HCT 40.6 05/25/2023 1327   PLT 209 05/25/2023 1327   MCV 92.1 05/25/2023 1327   MCH 31.7 05/25/2023 1327   MCHC 34.5 05/25/2023 1327   RDW 14.8 05/25/2023 1327   LYMPHSABS 1.4 05/25/2023 1327   MONOABS 0.5 05/25/2023 1327   EOSABS 0.0 05/25/2023 1327   BASOSABS 0.0 05/25/2023 1327     Chest Imaging: CT Chest: IMPRESSION: Confluent abnormal soft tissue identified extending from the left upper lobe perihilar through the hilum and into the left side of the mediastinum with the associated occlusion of the left upper lobe bronchus adjacent to this area extending superiorly as well as significant narrowing or occlusion  of the pulmonary vasculature.    Pulmonary Functions Testing Results:     No data to display          FeNO:   Pathology:   Echocardiogram:   Heart Catheterization:     Assessment & Plan:     ICD-10-CM  1. Pre-op testing  Z01.818 CBG per Guidelines for Diabetes Management for Patients Undergoing Surgery ([facility], [facility], and [facility] only)    CBG per protocol    CBG per Guidelines for Diabetes Management for Patients Undergoing Surgery ([facility], [facility], and [facility] only)    CBG per protocol      Left lung mass  Concern for mets  Discussion:  Here today for bronchoscopy. We will plan for tissue diagnosis and sampling today    Current Facility-Administered Medications:    insulin aspart (novoLOG) injection 0-7 Units, 0-7 Units, Subcutaneous, Q2H PRN, Linton Rump, MD, 5 Units at 06/12/23 2595   lactated ringers infusion, , Intravenous, Continuous, Linton Rump, MD   Josephine Igo, DO [place] [facility] Critical [facility] 06/12/2023 7:23 AM

## 2023-06-12 NOTE — Addendum Note (Signed)
Addendum  created 06/12/23 1113 by Marquis Buggy, CRNA   Child order released for a procedure order, Clinical Note Signed, Intraprocedure Blocks edited, LDA created via procedure documentation, SmartForm saved

## 2023-06-13 LAB — CYTOLOGY - NON PAP

## 2023-06-15 ENCOUNTER — Telehealth: Payer: Self-pay | Admitting: Physician Assistant

## 2023-06-15 ENCOUNTER — Encounter (HOSPITAL_COMMUNITY): Payer: Self-pay | Admitting: Pulmonary Disease

## 2023-06-15 NOTE — Telephone Encounter (Signed)
 I notified Jonathan Page by phone regarding lung biopsy results. Findings are consistent with adenocarcinoma. I discussed results with oncologist Dr. Sherrod who agrees to see patient in clinic as soon as possible. I reached out to our lung navigator Jonathan NOVAK, RN who will work on getting patient scheduled. All of patient's questions were answered and he expressed understanding of the plan provided.

## 2023-06-15 NOTE — Telephone Encounter (Signed)
 Bronch completed 06/12/2023 and f/u appt scheduled with Alexandria Lodge, NP for 06/20/2023 to review results.

## 2023-06-16 NOTE — Progress Notes (Signed)
 I reached out to pt to see if he would be available for a consult on 1/9 at 2;15 with Dr Sherrod and labs prior at 1:45. Pt stated he is available. He is familiar with our location and did not have any questions regarding the date/time/location of the appt at this time. Consult scheduler notified.   Request for pt's tissue be sent to [facility] for molecular studies emailed to Cydney frazier, [facility] path tech at this time.

## 2023-06-17 DIAGNOSIS — C3490 Malignant neoplasm of unspecified part of unspecified bronchus or lung: Secondary | ICD-10-CM | POA: Diagnosis not present

## 2023-06-20 ENCOUNTER — Ambulatory Visit: Payer: Medicare HMO | Admitting: Acute Care

## 2023-06-20 ENCOUNTER — Encounter: Payer: Self-pay | Admitting: Acute Care

## 2023-06-20 VITALS — BP 112/66 | HR 73 | Temp 97.5°F | Ht 73.0 in | Wt 149.0 lb

## 2023-06-20 DIAGNOSIS — Z9889 Other specified postprocedural states: Secondary | ICD-10-CM | POA: Diagnosis not present

## 2023-06-20 DIAGNOSIS — R911 Solitary pulmonary nodule: Secondary | ICD-10-CM

## 2023-06-20 DIAGNOSIS — Z87891 Personal history of nicotine dependence: Secondary | ICD-10-CM | POA: Diagnosis not present

## 2023-06-20 NOTE — Progress Notes (Signed)
 History of Present Illness Jonathan Page is a 73 y.o. male former smoker ( quit 2020 with a 15 pack year smoking history) urgently referred to Dr. Brenna for biopsy of what appears to be advanced stage disease. He underwent biopsy 06/12/2023.   PMH essential hypertension, type 2 diabetes, CKD stage III, hyperlipidemia, prolonged QT.   Synopsis Pt initial was diagnosed with bone lesions , staging CT Chest and abdomen showed pulmonary nodules. Biopsy of lung nodules was urgently requested for concern for advanced stage disease.Pt. underwent robotic assisted bronchoscopy with biopsies on 06/12/2023 by Dr. Brenna. He is here today with his wife to go over biopsy results.   06/20/2023 Pt. Presents for  follow up after Biopsy 06/12/2023 by Dr. Brenna.He states he has done well after the procedure. He denies fever, discolored secretions, bleeding or adverse anesthesia reaction.  We have reviewed the results of the biopsies, which are positive for adenocarcinoma. Patient has had significant weight loss of 40 pounds over the last 4 months.  He has an appointment with Dr. Sherrod 06/22/2023. There is sufficient tumor from the biopsy for molecular testing. Patient and his wife verbalized understanding of the above and had no further questions at completion of the OV. I have ordered a PET scan for staging. He will most likely need MR Brain to complete staging, and referral to Radiation oncology for bone metastasis.  Test Results: Cytology 06/12/2023 A. LUNG, LEFT MAIN STEM, BRUSHING:  - Adenocarcinoma   B. LUNG, LEFT MAIN STEM, BIOPSY:  - Adenocarcinoma   COMMENT:  There is sufficient tumor in part B for molecular testing.   05/29/2023 CT Chest / Abdomen and Pelvis Confluent abnormal soft tissue identified extending from the left upper lobe perihilar through the hilum and into the left side of the mediastinum with the associated occlusion of the left upper lobe bronchus adjacent to this area  extending superiorly as well as significant narrowing or occlusion of the pulmonary vasculature.   Additional reticulonodular areas extending and confluence fashion along the lingula which could represent additional spread of disease including lymphangitic spread. There scattered bilateral small lung nodules also seen, left-greater-than-right.   Abnormal lymph nodes seen left thoracic inlet.   Lytic scattered bone metastases identified with moth-eaten areas which include along the spine, ribs and proximal left femur. There is what may be a pathologic fracture of the left first rib. Some surrounding soft tissue.   Bilateral nonobstructing renal stones. Numerous bilateral benign-appearing renal cystic foci. Nonobstructing renal stones.   Nonspecific thickening of the adrenal glands. Attention on follow-up.   Tiny liver lesion is too small to completely characterize. Simple attention on follow-up.       Latest Ref Rng & Units 05/25/2023    1:27 PM 04/20/2023    3:22 PM 01/20/2023    9:48 AM  CBC  WBC 4.0 - 10.5 K/uL 6.9  8.3  4.7   Hemoglobin 13.0 - 17.0 g/dL 85.9  86.0  86.1   Hematocrit 39.0 - 52.0 % 40.6  42.0  43.0   Platelets 150 - 400 K/uL 209  212  168.0        Latest Ref Rng & Units 05/25/2023    1:27 PM 04/20/2023    3:22 PM 02/08/2023    1:56 PM  BMP  Glucose 70 - 99 mg/dL 562  809  73   BUN 8 - 23 mg/dL 15  16  21    Creatinine 0.61 - 1.24 mg/dL 8.67  8.63  8.41  Sodium 135 - 145 mmol/L 136  135  139   Potassium 3.5 - 5.1 mmol/L 4.0  3.9  4.1   Chloride 98 - 111 mmol/L 98  102  105   CO2 22 - 32 mmol/L 31  23  27    Calcium  8.9 - 10.3 mg/dL 89.1  89.7  9.5     BNP No results found for: BNP  ProBNP No results found for: PROBNP  PFT No results found for: FEV1PRE, FEV1POST, FVCPRE, FVCPOST, TLC, DLCOUNC, PREFEV1FVCRT, PSTFEV1FVCRT  CT CHEST ABDOMEN PELVIS W CONTRAST Result Date: 05/31/2023 CLINICAL DATA:  Bone metastases.  * Tracking  Code: BO *. EXAM: CT CHEST, ABDOMEN, AND PELVIS WITH CONTRAST TECHNIQUE: Multidetector CT imaging of the chest, abdomen and pelvis was performed following the standard protocol during bolus administration of intravenous contrast. RADIATION DOSE REDUCTION: This exam was performed according to the departmental dose-optimization program which includes automated exposure control, adjustment of the mA and/or kV according to patient size and/or use of iterative reconstruction technique. CONTRAST:  OMNIPAQUE  IOHEXOL  300 MG/ML  SOLN COMPARISON:  MRI lumbar spine 05/10/2023. FINDINGS: CT CHEST FINDINGS Cardiovascular: Small pericardial effusion. Heart nonenlarged. Coronary artery calcifications are noted. The thoracic aorta has a normal course and caliber with mild atherosclerotic plaque. Pulsation artifact along the ascending aorta. Bovine type aortic arch, normal variant. Mediastinum/Nodes: Slightly patulous thoracic esophagus. Slight wall thickening along the midthoracic esophagus, nonspecific. Please correlate with any symptoms. The thyroid  gland is preserved. No specific abnormal lymph node enlargement identified in the axillary regions, right hilum. There are some enlarged nodes identified left thoracic inlet measuring for examples 15 by 10 mm on series 2, image 11. There is confluence abnormal soft tissue extending from the left hilum into the mediastinum and extending left paratracheal and towards the AP window. For example this areas measured at 5.9 by 5.2 cm on series 2, image 29. There is also some soft tissue tracking along the course of the left pulmonary vessels and bronchi in this location with occlusion of the left upper lobe bronchus as seen on coronal series 5, image 78. Severe narrowing or occlusion of the pulmonary vasculature as well. Please correlate for neoplasm. Lungs/Pleura: Centrilobular emphysematous changes are identified. There are areas of reticulonodular changes seen with thickening along  the lingula extending from the hilum as seen for example series 4, image 80. Bronchial wall thickening and narrowing identified. Additional patchy areas of nodularity identified throughout the lingula. Example medially on series 4, image 96 measuring 10 mm. Small foci as well left lower lobe such as series 4, image 90 measuring 7 mm. These could represent additional areas of spread of disease with findings in the hilum, lymphangitic spread of disease and additional pulmonary metastatic foci. The right lung has some small nodules as well which are smaller such as right lower lobe series 4, image 90, measuring 9 by 6 mm right lung series 4, image 87 amongst some others. No pleural effusion or pneumothorax. Basilar atelectasis. Musculoskeletal: As described on the previous MRI lytic bone lesions are seen scattered throughout the thoracic spine, sternum and ribs. There is what may be a pathologic fracture involving the left first rib anteriorly with some soft tissue thickening as well. Small lipoma superficial to the left scapula identified. Overall if needed a whole-body bone scan may be of some benefit to assess for extent and distribution of disease. CT ABDOMEN PELVIS FINDINGS Hepatobiliary: Fatty liver infiltration. Tiny low-attenuation lesion seen left hepatic lobe series 2, image 62  is too small to completely characterize. Simple attention on follow up. Patent portal vein. Gallbladder is nondilated. Pancreas: Unremarkable. No pancreatic ductal dilatation or surrounding inflammatory changes. Spleen: Normal in size without focal abnormality. Small splenule. Slight nonspecific thickening of both adrenal glands. Numerous bilateral renal cysts are identified Bosniak 1 and 2 lesions. No clear enhancing or aggressive focus noted at this time. No collecting system dilatation. Preserved contours of the urinary bladder. Adrenals/Urinary Tract: Nonobstructing lower pole left-sided renal stones. Small right-sided midportion  renal stone Stomach/Bowel: On this non oral contrast exam the large bowel has a normal course and caliber with scattered colonic stool. The stomach and small bowel are nondilated. Vascular/Lymphatic: Aortic atherosclerosis. No enlarged abdominal or pelvic lymph nodes. Reproductive: Prostate is unremarkable. Other: No free air or free fluid identified.  Slight anasarca. Musculoskeletal: Once again scattered lytic and sclerotic bone lesions identified throughout the skeleton particularly along the lumbar spine and pelvis. There also significant lesion along the left greater trochanter extending to the neck of the left femur. Some soft tissue thickening identified. Some areas of the lumbar spine and left femur appear moth eaten. There also areas along the pelvis. IMPRESSION: Confluent abnormal soft tissue identified extending from the left upper lobe perihilar through the hilum and into the left side of the mediastinum with the associated occlusion of the left upper lobe bronchus adjacent to this area extending superiorly as well as significant narrowing or occlusion of the pulmonary vasculature. Additional reticulonodular areas extending and confluence fashion along the lingula which could represent additional spread of disease including lymphangitic spread. There scattered bilateral small lung nodules also seen, left-greater-than-right. Abnormal lymph nodes seen left thoracic inlet. Lytic scattered bone metastases identified with moth-eaten areas which include along the spine, ribs and proximal left femur. There is what may be a pathologic fracture of the left first rib. Some surrounding soft tissue. Bilateral nonobstructing renal stones. Numerous bilateral benign-appearing renal cystic foci. Nonobstructing renal stones. Nonspecific thickening of the adrenal glands. Attention on follow-up. Tiny liver lesion is too small to completely characterize. Simple attention on follow-up. Findings will be called to the ordering  service by the Radiology physician assistant team Electronically Signed   By: Ranell Bring M.D.   On: 05/31/2023 17:17     Past medical hx Past Medical History:  Diagnosis Date   Allergy    Cancer (HCC)    Cellulitis 02/21/2018   Concussion syndrome    Diabetes (HCC)    Frequent headaches    Hypertension      Social History   Tobacco Use   Smoking status: Former    Current packs/day: 0.00    Average packs/day: 0.5 packs/day for 30.0 years (15.0 ttl pk-yrs)    Types: Cigarettes, Cigars    Start date: 10/1988    Quit date: 10/2018    Years since quitting: 4.6   Smokeless tobacco: Never  Vaping Use   Vaping status: Never Used  Substance Use Topics   Alcohol  use: Yes    Alcohol /week: 6.0 standard drinks of alcohol     Types: 6 Cans of beer per week    Comment: occasional   Drug use: No    Mr.Weatherspoon reports that he quit smoking about 4 years ago. His smoking use included cigarettes and cigars. He started smoking about 34 years ago. He has a 15 pack-year smoking history. He has never used smokeless tobacco. He reports current alcohol  use of about 6.0 standard drinks of alcohol  per week. He reports that he  does not use drugs.  Tobacco Cessation: Former smoker quit 2020   Past surgical hx, Family hx, Social hx all reviewed.  Current Outpatient Medications on File Prior to Visit  Medication Sig   acetaminophen  (TYLENOL ) 500 MG tablet Take 500 mg by mouth every 6 (six) hours as needed.   aspirin  EC 81 MG tablet Take 81 mg by mouth daily.   Blood Glucose Monitoring Suppl (ACCU-CHEK AVIVA PLUS) w/Device KIT Check blood sugar before breakfast, lunch and bedtime and as directed. Dx E11.65   empagliflozin  (JARDIANCE ) 10 MG TABS tablet Take 1 tablet (10 mg total) by mouth daily before breakfast.   gabapentin  (NEURONTIN ) 300 MG capsule Take 1 capsule (300 mg total) by mouth at bedtime.   glipiZIDE  (GLUCOTROL  XL) 10 MG 24 hr tablet Take 1 tablet (10 mg total) by mouth daily with  breakfast. for diabetes.   glucose blood (ACCU-CHEK AVIVA PLUS) test strip Check blood sugar before breakfast, lunch and bedtime and as directed. Dx E11.65   insulin  NPH-regular Human (70-30) 100 UNIT/ML injection Inject 5 Units into the skin 2 (two) times daily with a meal.   Lancets (ACCU-CHEK SOFT TOUCH) lancets Check blood sugar before breakfast, lunch and bedtime and as directed. Dx E11.65   lisinopril  (ZESTRIL ) 10 MG tablet Take 1 tablet (10 mg total) by mouth daily.   pravastatin  (PRAVACHOL ) 40 MG tablet Take 1 tablet (40 mg total) by mouth daily. For cholesterol.   No current facility-administered medications on file prior to visit.     No Known Allergies  Review Of Systems:  Constitutional:   +  weight loss, night sweats,  Fevers, chills, fatigue, or  lassitude.  HEENT:   No headaches,  Difficulty swallowing,  Tooth/dental problems, or  Sore throat,                No sneezing, itching, ear ache, nasal congestion, post nasal drip,   CV:  No chest pain,  Orthopnea, PND, swelling in lower extremities, anasarca, dizziness, palpitations, syncope.   GI  No heartburn, indigestion, abdominal pain, nausea, vomiting, diarrhea, change in bowel habits, ++ loss of appetite, but improving, no bloody stools.   Resp: No shortness of breath with exertion or at rest.  No excess mucus, no productive cough,  No non-productive cough,  No coughing up of blood.  No change in color of mucus.  No wheezing.  No chest wall deformity  Skin: no rash or lesions.  GU: no dysuria, change in color of urine, no urgency or frequency.  No flank pain, no hematuria   MS:  No joint pain or swelling.  + decreased range of motion.  + back pain.  Psych:  No change in mood or affect. No depression or anxiety.  No memory loss.   Vital Signs BP 112/66 (BP Location: Right Arm, Patient Position: Sitting, Cuff Size: Normal)   Pulse 73   Temp (!) 97.5 F (36.4 C) (Oral)   Ht 6' 1 (1.854 m)   Wt 149 lb (67.6 kg)    SpO2 97%   BMI 19.66 kg/m    Physical Exam:  General- No distress,  A&Ox3, pleasant, cachectic ENT: No sinus tenderness, TM clear, pale nasal mucosa, no oral exudate,no post nasal drip, no LAN Cardiac: S1, S2, regular rate and rhythm, no murmur Chest: No wheeze/ rales/ dullness; no accessory muscle use, no nasal flaring, no sternal retractions Abd.: Soft Non-tender, ND, BS +, Body mass index is 19.66 kg/m.  Ext: No clubbing cyanosis, edema  Neuro:  normal strength, MAE x 4, A&O x 3 Skin: No rashes, warm and dry, no obvious lesions  Psych: normal mood and behavior   Assessment/Plan  New Diagnosis adenocarcinoma of the lung, Known bone lesions, concern for advanced stage disease Non-Small Cell Lung Cancer (NSCLC) Newly diagnosed via left main stem bronchus biopsy. Adenocarcinoma subtype confirmed. Tumor sample sufficient for molecular testing to identify potential markers for immune therapy.  Multiple sites of involvement noted, including large lymph nodes in the thoracic inlet, left hilar mass, and potential bone metastases. Rapid weight loss over the past four months. -Refer to Oncology (Dr. Sherrod) for further management, including staging, survival estimation, and discussion of systemic therapy options. Appointment scheduled for June 22, 2023. -Additional molecular and genetic testing may be ordered by oncologist. - Call us  if you need anything - Good Luck with treatment. - PET scan ordered for staging - Will need MR Brain and most likely referral to Radiation oncology in addition to medical oncology.   Smoking History Quit smoking five years ago (2020). -No specific plan item, continue smoking cessation.   I spent 25 minutes dedicated to the care of this patient on the date of this encounter to include pre-visit review of records, face-to-face time with the patient discussing conditions above, post visit ordering of testing, clinical documentation with the electronic health  record, making appropriate referrals as documented, and communicating necessary information to the patient's healthcare team.    Lauraine JULIANNA Lites, NP 06/20/2023  5:22 PM

## 2023-06-20 NOTE — Patient Instructions (Addendum)
 It is good to see you today. Your biopsy was positive for adenocarcinoma of the lung. This is a non small cell cancer . You have an appointment with Dr. Sherrod 06/22/2023.  He will take great care of you.  Call us  if you need anything Good Luck with treatment. Please contact office for sooner follow up if symptoms do not improve or worsen or seek emergency care

## 2023-06-21 ENCOUNTER — Encounter (HOSPITAL_COMMUNITY): Payer: Self-pay

## 2023-06-21 ENCOUNTER — Other Ambulatory Visit: Payer: Self-pay | Admitting: Medical Oncology

## 2023-06-21 DIAGNOSIS — R911 Solitary pulmonary nodule: Secondary | ICD-10-CM

## 2023-06-22 ENCOUNTER — Inpatient Hospital Stay: Payer: Medicare HMO | Admitting: Internal Medicine

## 2023-06-22 ENCOUNTER — Inpatient Hospital Stay: Payer: Medicare HMO

## 2023-06-22 ENCOUNTER — Inpatient Hospital Stay: Payer: Medicare HMO | Attending: Physician Assistant

## 2023-06-22 ENCOUNTER — Ambulatory Visit
Admission: RE | Admit: 2023-06-22 | Discharge: 2023-06-22 | Disposition: A | Discharge status: Home / Self Care | Payer: Medicare HMO | Source: Ambulatory Visit | Attending: Radiation Oncology | Admitting: Radiation Oncology

## 2023-06-22 VITALS — BP 104/70 | HR 102 | Temp 97.3°F | Resp 17 | Ht 73.0 in | Wt 148.6 lb

## 2023-06-22 DIAGNOSIS — N200 Calculus of kidney: Secondary | ICD-10-CM | POA: Insufficient documentation

## 2023-06-22 DIAGNOSIS — M549 Dorsalgia, unspecified: Secondary | ICD-10-CM | POA: Diagnosis not present

## 2023-06-22 DIAGNOSIS — N281 Cyst of kidney, acquired: Secondary | ICD-10-CM | POA: Insufficient documentation

## 2023-06-22 DIAGNOSIS — Z833 Family history of diabetes mellitus: Secondary | ICD-10-CM

## 2023-06-22 DIAGNOSIS — R911 Solitary pulmonary nodule: Secondary | ICD-10-CM

## 2023-06-22 DIAGNOSIS — N183 Chronic kidney disease, stage 3 unspecified: Secondary | ICD-10-CM | POA: Insufficient documentation

## 2023-06-22 DIAGNOSIS — R634 Abnormal weight loss: Secondary | ICD-10-CM | POA: Diagnosis not present

## 2023-06-22 DIAGNOSIS — R042 Hemoptysis: Secondary | ICD-10-CM | POA: Insufficient documentation

## 2023-06-22 DIAGNOSIS — F17210 Nicotine dependence, cigarettes, uncomplicated: Secondary | ICD-10-CM | POA: Insufficient documentation

## 2023-06-22 DIAGNOSIS — I129 Hypertensive chronic kidney disease with stage 1 through stage 4 chronic kidney disease, or unspecified chronic kidney disease: Secondary | ICD-10-CM | POA: Insufficient documentation

## 2023-06-22 DIAGNOSIS — R519 Headache, unspecified: Secondary | ICD-10-CM | POA: Insufficient documentation

## 2023-06-22 DIAGNOSIS — I10 Essential (primary) hypertension: Secondary | ICD-10-CM

## 2023-06-22 DIAGNOSIS — E1122 Type 2 diabetes mellitus with diabetic chronic kidney disease: Secondary | ICD-10-CM | POA: Insufficient documentation

## 2023-06-22 DIAGNOSIS — R0789 Other chest pain: Secondary | ICD-10-CM | POA: Insufficient documentation

## 2023-06-22 DIAGNOSIS — R059 Cough, unspecified: Secondary | ICD-10-CM

## 2023-06-22 DIAGNOSIS — K5900 Constipation, unspecified: Secondary | ICD-10-CM | POA: Insufficient documentation

## 2023-06-22 DIAGNOSIS — R5383 Other fatigue: Secondary | ICD-10-CM | POA: Diagnosis not present

## 2023-06-22 DIAGNOSIS — F17290 Nicotine dependence, other tobacco product, uncomplicated: Secondary | ICD-10-CM | POA: Insufficient documentation

## 2023-06-22 DIAGNOSIS — J432 Centrilobular emphysema: Secondary | ICD-10-CM | POA: Diagnosis not present

## 2023-06-22 DIAGNOSIS — Z9049 Acquired absence of other specified parts of digestive tract: Secondary | ICD-10-CM | POA: Insufficient documentation

## 2023-06-22 DIAGNOSIS — M899 Disorder of bone, unspecified: Secondary | ICD-10-CM | POA: Diagnosis not present

## 2023-06-22 DIAGNOSIS — G893 Neoplasm related pain (acute) (chronic): Secondary | ICD-10-CM | POA: Insufficient documentation

## 2023-06-22 DIAGNOSIS — D171 Benign lipomatous neoplasm of skin and subcutaneous tissue of trunk: Secondary | ICD-10-CM | POA: Insufficient documentation

## 2023-06-22 DIAGNOSIS — M8580 Other specified disorders of bone density and structure, unspecified site: Secondary | ICD-10-CM

## 2023-06-22 DIAGNOSIS — R490 Dysphonia: Secondary | ICD-10-CM | POA: Insufficient documentation

## 2023-06-22 DIAGNOSIS — C7951 Secondary malignant neoplasm of bone: Secondary | ICD-10-CM

## 2023-06-22 DIAGNOSIS — Z510 Encounter for antineoplastic radiation therapy: Secondary | ICD-10-CM | POA: Diagnosis present

## 2023-06-22 DIAGNOSIS — R0600 Dyspnea, unspecified: Secondary | ICD-10-CM | POA: Diagnosis not present

## 2023-06-22 DIAGNOSIS — C3412 Malignant neoplasm of upper lobe, left bronchus or lung: Secondary | ICD-10-CM

## 2023-06-22 DIAGNOSIS — C3490 Malignant neoplasm of unspecified part of unspecified bronchus or lung: Secondary | ICD-10-CM

## 2023-06-22 DIAGNOSIS — I700 Atherosclerosis of aorta: Secondary | ICD-10-CM | POA: Insufficient documentation

## 2023-06-22 DIAGNOSIS — Z79899 Other long term (current) drug therapy: Secondary | ICD-10-CM | POA: Insufficient documentation

## 2023-06-22 DIAGNOSIS — R197 Diarrhea, unspecified: Secondary | ICD-10-CM | POA: Insufficient documentation

## 2023-06-22 DIAGNOSIS — Z87891 Personal history of nicotine dependence: Secondary | ICD-10-CM | POA: Diagnosis not present

## 2023-06-22 DIAGNOSIS — E119 Type 2 diabetes mellitus without complications: Secondary | ICD-10-CM

## 2023-06-22 DIAGNOSIS — E785 Hyperlipidemia, unspecified: Secondary | ICD-10-CM | POA: Insufficient documentation

## 2023-06-22 DIAGNOSIS — R630 Anorexia: Secondary | ICD-10-CM

## 2023-06-22 LAB — CMP (CANCER CENTER ONLY)
ALT: 13 U/L (ref 0–44)
AST: 14 U/L — ABNORMAL LOW (ref 15–41)
Albumin: 4.3 g/dL (ref 3.5–5.0)
Alkaline Phosphatase: 207 U/L — ABNORMAL HIGH (ref 38–126)
Anion gap: 6 (ref 5–15)
BUN: 31 mg/dL — ABNORMAL HIGH (ref 8–23)
CO2: 30 mmol/L (ref 22–32)
Calcium: 11.7 mg/dL — ABNORMAL HIGH (ref 8.9–10.3)
Chloride: 101 mmol/L (ref 98–111)
Creatinine: 1.7 mg/dL — ABNORMAL HIGH (ref 0.61–1.24)
GFR, Estimated: 42 mL/min — ABNORMAL LOW (ref >60)
Glucose, Bld: 260 mg/dL — ABNORMAL HIGH (ref 70–99)
Potassium: 4.8 mmol/L (ref 3.5–5.1)
Sodium: 137 mmol/L (ref 135–145)
Total Bilirubin: 0.5 mg/dL (ref 0.0–1.2)
Total Protein: 7.4 g/dL (ref 6.5–8.1)

## 2023-06-22 LAB — CBC WITH DIFFERENTIAL (CANCER CENTER ONLY)
Abs Immature Granulocytes: 0.02 10*3/uL (ref 0.00–0.07)
Basophils Absolute: 0 10*3/uL (ref 0.0–0.1)
Basophils Relative: 0 %
Eosinophils Absolute: 0 10*3/uL (ref 0.0–0.5)
Eosinophils Relative: 0 %
HCT: 38.9 % — ABNORMAL LOW (ref 39.0–52.0)
Hemoglobin: 12.9 g/dL — ABNORMAL LOW (ref 13.0–17.0)
Immature Granulocytes: 0 %
Lymphocytes Relative: 21 %
Lymphs Abs: 1.7 10*3/uL (ref 0.7–4.0)
MCH: 30.4 pg (ref 26.0–34.0)
MCHC: 33.2 g/dL (ref 30.0–36.0)
MCV: 91.5 fL (ref 80.0–100.0)
Monocytes Absolute: 0.7 10*3/uL (ref 0.1–1.0)
Monocytes Relative: 8 %
Neutro Abs: 5.8 10*3/uL (ref 1.7–7.7)
Neutrophils Relative %: 71 %
Platelet Count: 292 10*3/uL (ref 150–400)
RBC: 4.25 MIL/uL (ref 4.22–5.81)
RDW: 14.6 % (ref 11.5–15.5)
WBC Count: 8.2 10*3/uL (ref 4.0–10.5)
nRBC: 0 % (ref 0.0–0.2)

## 2023-06-22 MED ORDER — CYANOCOBALAMIN 1000 MCG/ML IJ SOLN
1000.0000 ug | Freq: Once | INTRAMUSCULAR | Status: AC
  Administered 2023-06-22: 1000 ug via INTRAMUSCULAR
  Filled 2023-06-22: qty 1

## 2023-06-22 MED ORDER — LIDOCAINE-PRILOCAINE 2.5-2.5 % EX CREA: TOPICAL_CREAM | CUTANEOUS | 3 refills | Status: DC

## 2023-06-22 MED ORDER — PROCHLORPERAZINE MALEATE 10 MG PO TABS: 10.0000 mg | ORAL_TABLET | Freq: Four times a day (QID) | ORAL | 1 refills | Status: DC | PRN

## 2023-06-22 MED ORDER — ONDANSETRON HCL 8 MG PO TABS: 8.0000 mg | ORAL_TABLET | Freq: Three times a day (TID) | ORAL | 1 refills | Status: DC | PRN

## 2023-06-22 MED ORDER — OXYCODONE-ACETAMINOPHEN 5-325 MG PO TABS: 1.0000 | ORAL_TABLET | Freq: Three times a day (TID) | ORAL | 0 refills | Status: DC | PRN

## 2023-06-22 MED ORDER — FOLIC ACID 1 MG PO TABS: 1.0000 mg | ORAL_TABLET | Freq: Every day | ORAL | 3 refills | Status: DC

## 2023-06-22 NOTE — Progress Notes (Signed)
 [facility] [phone]   [phone]  CONSULT NOTE  REFERRING PHYSICIAN: Dr. Adine Icard  REASON FOR CONSULTATION:  73 years old African-American male recently diagnosed with lung cancer  HPI Jonathan Page is a 73 y.o. male presented to the clinic today for initial evaluation of his recently diagnosed lung cancer accompanied by his significant other Jonathan Page. Discussed the use of AI scribe software for clinical note transcription with the patient, who gave verbal consent to proceed.  History of Present Illness   The patient, a 72 year old with a recent diagnosis of lung adenocarcinoma, presents with a three-month history of weight loss and leg soreness. The patient's initial presentation to the hospital three months ago was prompted by these symptoms. At that time, imaging studies revealed a tumor in the spine and widespread bone metastases. Further imaging showed a mass in the left upper lobe of the lung with extension into the hilar and mediastinal area, additional lung nodules, and lymph node involvement. The patient also has bone disease in the spine, ribs, and iliac bone. A subsequent bronchoscopy confirmed the diagnosis of lung adenocarcinoma.  Currently, the patient reports hoarseness, loss of leg muscle tone and increasing weakness in both legs, and left-sided chest pain. The patient denies back pain, numbness in the legs, chest pain other than on the left side, cough, hemoptysis, nausea, vomiting, diarrhea, constipation, headaches, and changes in vision. The patient has lost approximately 45 pounds over the past three to four months.  The patient has a history of diabetes, hypertension, and seasonal allergies. The patient denies any history of heart attacks, strokes, or high cholesterol. The patient has a 50-year history of smoking but quit one month ago. The patient also has a history of alcohol  use but has lost the taste for it recently. The  patient denies any illicit drug use and has no known drug allergies. The patient's father had a brain tumor and died due to complications from surgery, while the patient's mother is alive and well at 34 years old with no major medical issues. The patient has two sons and worked as a financial controller for approximately 17-18 years.       HPI  Past Medical History:  Diagnosis Date   Allergy    Cancer (HCC)    Cellulitis 02/21/2018   Concussion syndrome    Diabetes (HCC)    Frequent headaches    Hypertension     Past Surgical History:  Procedure Laterality Date   APPENDECTOMY  1960   BRONCHIAL BIOPSY  06/12/2023   Procedure: BRONCHIAL BIOPSIES;  Surgeon: Brenna Adine CROME, DO;  Location: [facility];  Service: Cardiopulmonary;;   BRONCHIAL BRUSHINGS  06/12/2023   Procedure: BRONCHIAL BRUSHINGS;  Surgeon: Brenna Adine CROME, DO;  Location: [facility];  Service: Cardiopulmonary;;   COLONOSCOPY     VIDEO BRONCHOSCOPY Bilateral 06/12/2023   Procedure: VIDEO BRONCHOSCOPY;  Surgeon: Brenna Adine CROME, DO;  Location: [facility];  Service: Cardiopulmonary;  Laterality: Bilateral;   WISDOM TOOTH EXTRACTION      Family History  Problem Relation Age of Onset   Diabetes Father    Diabetes Paternal Grandfather    Diabetes Paternal Grandmother    Colon cancer Neg Hx    Crohn's disease Neg Hx    Rectal cancer Neg Hx    Stomach cancer Neg Hx    Esophageal cancer Neg Hx     Social History Social History   Tobacco Use   Smoking status: Former  Current packs/day: 0.00    Average packs/day: 0.5 packs/day for 30.0 years (15.0 ttl pk-yrs)    Types: Cigarettes, Cigars    Start date: 10/1988    Quit date: 10/2018    Years since quitting: 4.6   Smokeless tobacco: Never  Vaping Use   Vaping status: Never Used  Substance Use Topics   Alcohol  use: Yes    Alcohol /week: 6.0 standard drinks of alcohol     Types: 6 Cans of beer per week    Comment: occasional   Drug use: No    No Known  Allergies  Current Outpatient Medications  Medication Sig Dispense Refill   acetaminophen  (TYLENOL ) 500 MG tablet Take 500 mg by mouth every 6 (six) hours as needed.     aspirin  EC 81 MG tablet Take 81 mg by mouth daily.     Blood Glucose Monitoring Suppl (ACCU-CHEK AVIVA PLUS) w/Device KIT Check blood sugar before breakfast, lunch and bedtime and as directed. Dx E11.65 1 kit 0   empagliflozin  (JARDIANCE ) 10 MG TABS tablet Take 1 tablet (10 mg total) by mouth daily before breakfast. 30 tablet 3   gabapentin  (NEURONTIN ) 300 MG capsule Take 1 capsule (300 mg total) by mouth at bedtime. 30 capsule 0   glipiZIDE  (GLUCOTROL  XL) 10 MG 24 hr tablet Take 1 tablet (10 mg total) by mouth daily with breakfast. for diabetes. 90 tablet 3   glucose blood (ACCU-CHEK AVIVA PLUS) test strip Check blood sugar before breakfast, lunch and bedtime and as directed. Dx E11.65 300 each 1   insulin  NPH-regular Human (70-30) 100 UNIT/ML injection Inject 5 Units into the skin 2 (two) times daily with a meal.     Lancets (ACCU-CHEK SOFT TOUCH) lancets Check blood sugar before breakfast, lunch and bedtime and as directed. Dx E11.65 300 each 1   lisinopril  (ZESTRIL ) 10 MG tablet Take 1 tablet (10 mg total) by mouth daily. 90 tablet 1   pravastatin  (PRAVACHOL ) 40 MG tablet Take 1 tablet (40 mg total) by mouth daily. For cholesterol. 90 tablet 3   No current facility-administered medications for this visit.    Review of Systems  Constitutional: positive for anorexia, fatigue, and weight loss Eyes: negative Ears, nose, mouth, throat, and face: negative Respiratory: positive for cough, dyspnea on exertion, and pleurisy/chest pain Cardiovascular: negative Gastrointestinal: negative Genitourinary:negative Integument/breast: negative Hematologic/lymphatic: negative Musculoskeletal:positive for back pain and bone pain Neurological: negative Behavioral/Psych: negative Endocrine: negative Allergic/Immunologic:  negative  Physical Exam  MJO:jozmu, healthy, no distress, well nourished, and well developed SKIN: skin color, texture, turgor are normal, no rashes or significant lesions HEAD: Normocephalic, No masses, lesions, tenderness or abnormalities EYES: normal, PERRLA, Conjunctiva are pink and non-injected EARS: External ears normal, Canals clear OROPHARYNX:no exudate, no erythema, and lips, buccal mucosa, and tongue normal  NECK: supple, no adenopathy, no JVD LYMPH:  no palpable lymphadenopathy, no hepatosplenomegaly LUNGS: clear to auscultation , and palpation HEART: regular rate & rhythm, no murmurs, and no gallops ABDOMEN:abdomen soft, non-tender, normal bowel sounds, and no masses or organomegaly BACK: Back symmetric, no curvature., No CVA tenderness EXTREMITIES:no joint deformities, effusion, or inflammation, no edema  NEURO: alert & oriented x 3 with fluent speech, no focal motor/sensory deficits  PERFORMANCE STATUS: ECOG 1  LABORATORY DATA: Lab Results  Component Value Date   WBC 8.2 06/22/2023   HGB 12.9 (L) 06/22/2023   HCT 38.9 (L) 06/22/2023   MCV 91.5 06/22/2023   PLT 292 06/22/2023      Chemistry  Component Value Date/Time   NA 136 05/25/2023 1327   K 4.0 05/25/2023 1327   CL 98 05/25/2023 1327   CO2 31 05/25/2023 1327   BUN 15 05/25/2023 1327   CREATININE 1.32 (H) 05/25/2023 1327      Component Value Date/Time   CALCIUM  10.8 (H) 05/25/2023 1327   ALKPHOS 251 (H) 05/25/2023 1327   AST 12 (L) 05/25/2023 1327   ALT 11 05/25/2023 1327   BILITOT 0.7 05/25/2023 1327       RADIOGRAPHIC STUDIES: CT CHEST ABDOMEN PELVIS W CONTRAST Result Date: 05/31/2023 CLINICAL DATA:  Bone metastases.  * Tracking Code: BO *. EXAM: CT CHEST, ABDOMEN, AND PELVIS WITH CONTRAST TECHNIQUE: Multidetector CT imaging of the chest, abdomen and pelvis was performed following the standard protocol during bolus administration of intravenous contrast. RADIATION DOSE REDUCTION: This exam  was performed according to the departmental dose-optimization program which includes automated exposure control, adjustment of the mA and/or kV according to patient size and/or use of iterative reconstruction technique. CONTRAST:  OMNIPAQUE  IOHEXOL  300 MG/ML  SOLN COMPARISON:  MRI lumbar spine 05/10/2023. FINDINGS: CT CHEST FINDINGS Cardiovascular: Small pericardial effusion. Heart nonenlarged. Coronary artery calcifications are noted. The thoracic aorta has a normal course and caliber with mild atherosclerotic plaque. Pulsation artifact along the ascending aorta. Bovine type aortic arch, normal variant. Mediastinum/Nodes: Slightly patulous thoracic esophagus. Slight wall thickening along the midthoracic esophagus, nonspecific. Please correlate with any symptoms. The thyroid  gland is preserved. No specific abnormal lymph node enlargement identified in the axillary regions, right hilum. There are some enlarged nodes identified left thoracic inlet measuring for examples 15 by 10 mm on series 2, image 11. There is confluence abnormal soft tissue extending from the left hilum into the mediastinum and extending left paratracheal and towards the AP window. For example this areas measured at 5.9 by 5.2 cm on series 2, image 29. There is also some soft tissue tracking along the course of the left pulmonary vessels and bronchi in this location with occlusion of the left upper lobe bronchus as seen on coronal series 5, image 78. Severe narrowing or occlusion of the pulmonary vasculature as well. Please correlate for neoplasm. Lungs/Pleura: Centrilobular emphysematous changes are identified. There are areas of reticulonodular changes seen with thickening along the lingula extending from the hilum as seen for example series 4, image 80. Bronchial wall thickening and narrowing identified. Additional patchy areas of nodularity identified throughout the lingula. Example medially on series 4, image 96 measuring 10 mm. Small  foci as well left lower lobe such as series 4, image 90 measuring 7 mm. These could represent additional areas of spread of disease with findings in the hilum, lymphangitic spread of disease and additional pulmonary metastatic foci. The right lung has some small nodules as well which are smaller such as right lower lobe series 4, image 90, measuring 9 by 6 mm right lung series 4, image 87 amongst some others. No pleural effusion or pneumothorax. Basilar atelectasis. Musculoskeletal: As described on the previous MRI lytic bone lesions are seen scattered throughout the thoracic spine, sternum and ribs. There is what may be a pathologic fracture involving the left first rib anteriorly with some soft tissue thickening as well. Small lipoma superficial to the left scapula identified. Overall if needed a whole-body bone scan may be of some benefit to assess for extent and distribution of disease. CT ABDOMEN PELVIS FINDINGS Hepatobiliary: Fatty liver infiltration. Tiny low-attenuation lesion seen left hepatic lobe series 2, image 62 is  too small to completely characterize. Simple attention on follow up. Patent portal vein. Gallbladder is nondilated. Pancreas: Unremarkable. No pancreatic ductal dilatation or surrounding inflammatory changes. Spleen: Normal in size without focal abnormality. Small splenule. Slight nonspecific thickening of both adrenal glands. Numerous bilateral renal cysts are identified Bosniak 1 and 2 lesions. No clear enhancing or aggressive focus noted at this time. No collecting system dilatation. Preserved contours of the urinary bladder. Adrenals/Urinary Tract: Nonobstructing lower pole left-sided renal stones. Small right-sided midportion renal stone Stomach/Bowel: On this non oral contrast exam the large bowel has a normal course and caliber with scattered colonic stool. The stomach and small bowel are nondilated. Vascular/Lymphatic: Aortic atherosclerosis. No enlarged abdominal or pelvic lymph  nodes. Reproductive: Prostate is unremarkable. Other: No free air or free fluid identified.  Slight anasarca. Musculoskeletal: Once again scattered lytic and sclerotic bone lesions identified throughout the skeleton particularly along the lumbar spine and pelvis. There also significant lesion along the left greater trochanter extending to the neck of the left femur. Some soft tissue thickening identified. Some areas of the lumbar spine and left femur appear moth eaten. There also areas along the pelvis. IMPRESSION: Confluent abnormal soft tissue identified extending from the left upper lobe perihilar through the hilum and into the left side of the mediastinum with the associated occlusion of the left upper lobe bronchus adjacent to this area extending superiorly as well as significant narrowing or occlusion of the pulmonary vasculature. Additional reticulonodular areas extending and confluence fashion along the lingula which could represent additional spread of disease including lymphangitic spread. There scattered bilateral small lung nodules also seen, left-greater-than-right. Abnormal lymph nodes seen left thoracic inlet. Lytic scattered bone metastases identified with moth-eaten areas which include along the spine, ribs and proximal left femur. There is what may be a pathologic fracture of the left first rib. Some surrounding soft tissue. Bilateral nonobstructing renal stones. Numerous bilateral benign-appearing renal cystic foci. Nonobstructing renal stones. Nonspecific thickening of the adrenal glands. Attention on follow-up. Tiny liver lesion is too small to completely characterize. Simple attention on follow-up. Findings will be called to the ordering service by the Radiology physician assistant team Electronically Signed   By: Ranell Bring M.D.   On: 05/31/2023 17:17    ASSESSMENT: This is a very pleasant 73 years old African-American male recently diagnosed with stage IV (T3, N2, M1 C) non-small cell  lung cancer, adenocarcinoma presented with large left upper lobe lung mass with left hilar and mediastinal invasion in addition to thoracic inlet lymphadenopathy as well as several metastatic bone lesions involving the spine, ribs as well as the right iliac bone diagnosed in December 2024.  Molecular studies by [email or web address] showed no actionable mutations and PD-L1 expression was negative.   PLAN: I had a lengthy discussion with the patient and his girlfriend today about his current disease stage, prognosis and treatment options. I explained to the patient that he has incurable condition and all the treatment will be of palliative nature.  He was given the option of palliative care and hospice referral versus consideration of palliative systemic chemotherapy with carboplatin  for AUC of 5, Alimta 500 Mg/M2 and Keytruda 200 Mg IV every 3 weeks.  The patient is interested in the treatment. Assessment and Plan    Stage IV (T3, N2, M1c) Non-Small Cell Lung Cancer (Adenocarcinoma) Diagnosed with adenocarcinoma in the left upper lobe with hilar and mediastinal extension and widespread bone metastasis (spine, ribs, iliac bone). Symptoms include hoarseness, left-sided chest pain, significant weight  loss (45 pounds over 3-4 months), and bilateral leg weakness. Stage IV indicates non-curable but treatable cancer. Treatment options discussed: palliative care, radiation therapy, and systemic therapy (chemotherapy and immunotherapy). Radiation can control pain and improve function.   Molecular studies by [email or web address] on the blood showed no actionable mutations.  His PD-L1 expression was negative - I recommended for him treatment with combination chemotherapy and immunotherapy, with an average survival of two years. Without treatment, survival is 3-6 months. Awaiting PET scan, brain MRI, and molecular marker results. He is interested in treatment. - He will be treated with systemic chemoimmunotherapy with carboplatin  for  AUC of 5, Alimta 500 Mg/M2 and Keytruda 200 Mg IV every 3 weeks.  First dose expected in less than 2 weeks - Order PET scan - Order brain MRI - Refer to radiation oncologist for palliative radiation therapy - Provide pain management with Percocet and gabapentin  - Refer to palliative care team for pain management - Schedule follow-up with radiation oncologist  Diabetes Mellitus Diabetes mellitus, no specific symptoms or complications discussed. - Continue current diabetes management  Hypertension Hypertension, no specific symptoms or complications discussed. - Continue current hypertension management  Hyperlipidemia Hyperlipidemia, no specific symptoms or complications discussed. - Continue current hyperlipidemia management  General Health Maintenance Smoking cessation achieved, no other issues discussed. - Encourage continued smoking cessation  Follow-up - Schedule PET scan - Schedule brain MRI - Follow-up with radiation oncologist - Follow-up with palliative care team   He was advised to call immediately if he has any concerning symptoms in the interval. The patient voices understanding of current disease status and treatment options and is in agreement with the current care plan.  All questions were answered. The patient knows to call the clinic with any problems, questions or concerns. We can certainly see the patient much sooner if necessary.  Thank you so much for allowing me to participate in the care of Jonathan Page. I will continue to follow up the patient with you and assist in his care.  The total time spent in the appointment was 90 minutes.  Disclaimer: This note was dictated with voice recognition software. Similar sounding words can inadvertently be transcribed and may not be corrected upon review.   Jonathan Page June 22, 2023, 2:47 PM

## 2023-06-22 NOTE — Progress Notes (Signed)
 Radiation Oncology         ([phone]) [phone] ________________________________  Initial Outpatient Consultation  Name: Jonathan Page MRN: [id number]  Date of Service: 06/22/2023 DOB: Oct 13, 1950  RR:Rojmx, Comer POUR, NP  Sherrod Sherrod, MD   REFERRING PHYSICIAN: Sherrod Sherrod, MD  DIAGNOSIS: 73 y/o man with painful osseous metastasis with epidural extension at L4-L5, secondary to newly diagnosed Stage IV NSCLC, adenocarcinoma.     ICD-10-CM   1. Primary malignant neoplasm of left upper lobe of lung (HCC)  C34.12     2. Non-small cell lung cancer metastatic to bone [facility])  C34.90    C79.51       HISTORY OF PRESENT ILLNESS: Jonathan Page is a 73 y.o. male seen at the request of Dr. Sherrod.  He was incidentally found to have widespread osseous metastatic disease on recent orthopedic workup for low back pain where an MRI lumbar spine on 05/10/2023 showed widespread bony metastasis with dominant deposits at L4-L5 with osseous tumor extension involving the L4-5 foramen, epidural space and adjacent right lumbar plexus.  A CT C/A/P was performed on 05/29/2023 to attempt to identify a primary source of disease and this revealed an abnormal soft tissue mass in the left hilum extending into the left mediastinum measuring approximately 5.9 x 5.2 cm.  Also with soft tissue tracking along the left pulmonary vessels and bronchi with occlusion of the LUL bronchus, small bilateral pulmonary nodules L>R, thoracic lymphadenopathy and scattered lytic lesions throughout the spine, sternum, ribs and pelvis.  There were some reticulonodular areas along the lingula concerning for possible lymphangitic spread.  He subsequently underwent bronchoscopy on 06/12/2023 under the care of Dr. Brenna and pathology confirmed NSCLC, adenocarcinoma.  His case and imaging were discussed in the multidisciplinary thoracic oncology conference this morning and consensus recommendation is to complete his disease staging with brain MRI  and PET scans and to offer palliative radiotherapy to the lumbar spine for pain management and preservation of cord function. He has met with Dr. Sherrod earlier this afternoon and will follow-up again after completion of his imaging for disease staging to discuss formal treatment recommendations.  In the interim, we have been kindly asked to consult the patient for consideration of palliative radiotherapy to the painful metastatic disease at L4-L5.  PREVIOUS RADIATION THERAPY: No  PAST MEDICAL HISTORY:  Past Medical History:  Diagnosis Date   Allergy    Cancer (HCC)    Cellulitis 02/21/2018   Concussion syndrome    Diabetes (HCC)    Frequent headaches    Hypertension       PAST SURGICAL HISTORY: Past Surgical History:  Procedure Laterality Date   APPENDECTOMY  1960   BRONCHIAL BIOPSY  06/12/2023   Procedure: BRONCHIAL BIOPSIES;  Surgeon: Brenna Adine CROME, DO;  Location: [facility];  Service: Cardiopulmonary;;   BRONCHIAL BRUSHINGS  06/12/2023   Procedure: BRONCHIAL BRUSHINGS;  Surgeon: Brenna Adine CROME, DO;  Location: [facility];  Service: Cardiopulmonary;;   COLONOSCOPY     VIDEO BRONCHOSCOPY Bilateral 06/12/2023   Procedure: VIDEO BRONCHOSCOPY;  Surgeon: Brenna Adine CROME, DO;  Location: [facility];  Service: Cardiopulmonary;  Laterality: Bilateral;   WISDOM TOOTH EXTRACTION      FAMILY HISTORY:  Family History  Problem Relation Age of Onset   Diabetes Father    Diabetes Paternal Grandfather    Diabetes Paternal Grandmother    Colon cancer Neg Hx    Crohn's disease Neg Hx    Rectal cancer Neg Hx  Stomach cancer Neg Hx    Esophageal cancer Neg Hx     SOCIAL HISTORY:  Social History   Socioeconomic History   Marital status: Divorced    Spouse name: Not on file   Number of children: Not on file   Years of education: Not on file   Highest education level: Not on file  Occupational History   Not on file  Tobacco Use   Smoking status: Former    Current  packs/day: 0.00    Average packs/day: 0.5 packs/day for 30.0 years (15.0 ttl pk-yrs)    Types: Cigarettes, Cigars    Start date: 10/1988    Quit date: 10/2018    Years since quitting: 4.6   Smokeless tobacco: Never  Vaping Use   Vaping status: Never Used  Substance and Sexual Activity   Alcohol  use: Yes    Alcohol /week: 6.0 standard drinks of alcohol     Types: 6 Cans of beer per week    Comment: occasional   Drug use: No   Sexual activity: Not on file  Other Topics Concern   Not on file  Social History Narrative   Divorced.   2 children, 1 grandchild.   Retired. Once worked as a financial controller.    Enjoys relaxing.    Social Drivers of Corporate Investment Banker Strain: Not on file  Food Insecurity: Not on file  Transportation Needs: Not on file  Physical Activity: Not on file  Stress: Not on file  Social Connections: Not on file  Intimate Partner Violence: Not on file    ALLERGIES: Patient has no known allergies.  MEDICATIONS:  Current Outpatient Medications  Medication Sig Dispense Refill   acetaminophen  (TYLENOL ) 500 MG tablet Take 500 mg by mouth every 6 (six) hours as needed.     aspirin  EC 81 MG tablet Take 81 mg by mouth daily.     Blood Glucose Monitoring Suppl (ACCU-CHEK AVIVA PLUS) w/Device KIT Check blood sugar before breakfast, lunch and bedtime and as directed. Dx E11.65 1 kit 0   empagliflozin  (JARDIANCE ) 10 MG TABS tablet Take 1 tablet (10 mg total) by mouth daily before breakfast. 30 tablet 3   gabapentin  (NEURONTIN ) 300 MG capsule Take 1 capsule (300 mg total) by mouth at bedtime. 30 capsule 0   glipiZIDE  (GLUCOTROL  XL) 10 MG 24 hr tablet Take 1 tablet (10 mg total) by mouth daily with breakfast. for diabetes. 90 tablet 3   glucose blood (ACCU-CHEK AVIVA PLUS) test strip Check blood sugar before breakfast, lunch and bedtime and as directed. Dx E11.65 300 each 1   insulin  NPH-regular Human (70-30) 100 UNIT/ML injection Inject 5 Units into the skin 2  (two) times daily with a meal.     Lancets (ACCU-CHEK SOFT TOUCH) lancets Check blood sugar before breakfast, lunch and bedtime and as directed. Dx E11.65 300 each 1   lisinopril  (ZESTRIL ) 10 MG tablet Take 1 tablet (10 mg total) by mouth daily. 90 tablet 1   pravastatin  (PRAVACHOL ) 40 MG tablet Take 1 tablet (40 mg total) by mouth daily. For cholesterol. 90 tablet 3   No current facility-administered medications for this encounter.    REVIEW OF SYSTEMS:  On review of systems, the patient reports that he is doing fair in general. He started having bilateral leg pain x 4 months.  Describes the pain as an aching sensation in his knees that radiates up to his hips.  Pain is intermittent.  In the last 2 to 3 weeks  he also developed low back pain that has progressively worsened. He does admit that the pain keeps him awake at night sometimes. He is currently managing pain with gabapentin  and Tylenol .  The orthopedist recently increased his dose of gabapentin  from 100 mg to 300 mg  approximately 1 week ago.  He denies any fall or injury preceding pain.  Denies any changes in urinary or bowel habits.  Denies any numbness or tingling. He denies any chest pain, shortness of breath, cough, fevers, chills, or night sweats but has had a 40 lb unintended weight loss in the last 4 months. He denies abdominal pain, nausea or vomiting and denies any new musculoskeletal or joint aches or pains. A complete review of systems is obtained and is otherwise negative.    PHYSICAL EXAM:  Wt Readings from Last 3 Encounters:  06/20/23 149 lb (67.6 kg)  06/12/23 155 lb (70.3 kg)  05/29/23 160 lb (72.6 kg)   Temp Readings from Last 3 Encounters:  06/20/23 (!) 97.5 F (36.4 C) (Oral)  06/12/23 97.7 F (36.5 C)  05/29/23 97.7 F (36.5 C) (Oral)   BP Readings from Last 3 Encounters:  06/20/23 112/66  06/12/23 117/68  05/29/23 124/80   Pulse Readings from Last 3 Encounters:  06/20/23 73  06/12/23 94  05/29/23 95     /10  In general this is a well appearing African American man in no acute distress. He's alert and oriented x4 and appropriate throughout the examination. Cardiopulmonary assessment is negative for acute distress and he exhibits normal effort.   KPS = 60  100 - Normal; no complaints; no evidence of disease. 90   - Able to carry on normal activity; minor signs or symptoms of disease. 80   - Normal activity with effort; some signs or symptoms of disease. 51   - Cares for self; unable to carry on normal activity or to do active work. 60   - Requires occasional assistance, but is able to care for most of his personal needs. 50   - Requires considerable assistance and frequent medical care. 40   - Disabled; requires special care and assistance. 30   - Severely disabled; hospital admission is indicated although death not imminent. 20   - Very sick; hospital admission necessary; active supportive treatment necessary. 10   - Moribund; fatal processes progressing rapidly. 0     - Dead  Karnofsky DA, Abelmann WH, Craver LS and Burchenal [facility] [phone]) The use of the nitrogen mustards in the palliative treatment of carcinoma: with particular reference to bronchogenic carcinoma Cancer 1 [id number]  LABORATORY DATA:  Lab Results  Component Value Date   WBC 6.9 05/25/2023   HGB 14.0 05/25/2023   HCT 40.6 05/25/2023   MCV 92.1 05/25/2023   PLT 209 05/25/2023   Lab Results  Component Value Date   NA 136 05/25/2023   K 4.0 05/25/2023   CL 98 05/25/2023   CO2 31 05/25/2023   Lab Results  Component Value Date   ALT 11 05/25/2023   AST 12 (L) 05/25/2023   ALKPHOS 251 (H) 05/25/2023   BILITOT 0.7 05/25/2023     RADIOGRAPHY: CT CHEST ABDOMEN PELVIS W CONTRAST Result Date: 05/31/2023 CLINICAL DATA:  Bone metastases.  * Tracking Code: BO *. EXAM: CT CHEST, ABDOMEN, AND PELVIS WITH CONTRAST TECHNIQUE: Multidetector CT imaging of the chest, abdomen and pelvis was performed following the standard protocol  during bolus administration of intravenous contrast. RADIATION DOSE REDUCTION: This exam was performed according  to the departmental dose-optimization program which includes automated exposure control, adjustment of the mA and/or kV according to patient size and/or use of iterative reconstruction technique. CONTRAST:  OMNIPAQUE  IOHEXOL  300 MG/ML  SOLN COMPARISON:  MRI lumbar spine 05/10/2023. FINDINGS: CT CHEST FINDINGS Cardiovascular: Small pericardial effusion. Heart nonenlarged. Coronary artery calcifications are noted. The thoracic aorta has a normal course and caliber with mild atherosclerotic plaque. Pulsation artifact along the ascending aorta. Bovine type aortic arch, normal variant. Mediastinum/Nodes: Slightly patulous thoracic esophagus. Slight wall thickening along the midthoracic esophagus, nonspecific. Please correlate with any symptoms. The thyroid  gland is preserved. No specific abnormal lymph node enlargement identified in the axillary regions, right hilum. There are some enlarged nodes identified left thoracic inlet measuring for examples 15 by 10 mm on series 2, image 11. There is confluence abnormal soft tissue extending from the left hilum into the mediastinum and extending left paratracheal and towards the AP window. For example this areas measured at 5.9 by 5.2 cm on series 2, image 29. There is also some soft tissue tracking along the course of the left pulmonary vessels and bronchi in this location with occlusion of the left upper lobe bronchus as seen on coronal series 5, image 78. Severe narrowing or occlusion of the pulmonary vasculature as well. Please correlate for neoplasm. Lungs/Pleura: Centrilobular emphysematous changes are identified. There are areas of reticulonodular changes seen with thickening along the lingula extending from the hilum as seen for example series 4, image 80. Bronchial wall thickening and narrowing identified. Additional patchy areas of nodularity  identified throughout the lingula. Example medially on series 4, image 96 measuring 10 mm. Small foci as well left lower lobe such as series 4, image 90 measuring 7 mm. These could represent additional areas of spread of disease with findings in the hilum, lymphangitic spread of disease and additional pulmonary metastatic foci. The right lung has some small nodules as well which are smaller such as right lower lobe series 4, image 90, measuring 9 by 6 mm right lung series 4, image 87 amongst some others. No pleural effusion or pneumothorax. Basilar atelectasis. Musculoskeletal: As described on the previous MRI lytic bone lesions are seen scattered throughout the thoracic spine, sternum and ribs. There is what may be a pathologic fracture involving the left first rib anteriorly with some soft tissue thickening as well. Small lipoma superficial to the left scapula identified. Overall if needed a whole-body bone scan may be of some benefit to assess for extent and distribution of disease. CT ABDOMEN PELVIS FINDINGS Hepatobiliary: Fatty liver infiltration. Tiny low-attenuation lesion seen left hepatic lobe series 2, image 62 is too small to completely characterize. Simple attention on follow up. Patent portal vein. Gallbladder is nondilated. Pancreas: Unremarkable. No pancreatic ductal dilatation or surrounding inflammatory changes. Spleen: Normal in size without focal abnormality. Small splenule. Slight nonspecific thickening of both adrenal glands. Numerous bilateral renal cysts are identified Bosniak 1 and 2 lesions. No clear enhancing or aggressive focus noted at this time. No collecting system dilatation. Preserved contours of the urinary bladder. Adrenals/Urinary Tract: Nonobstructing lower pole left-sided renal stones. Small right-sided midportion renal stone Stomach/Bowel: On this non oral contrast exam the large bowel has a normal course and caliber with scattered colonic stool. The stomach and small bowel are  nondilated. Vascular/Lymphatic: Aortic atherosclerosis. No enlarged abdominal or pelvic lymph nodes. Reproductive: Prostate is unremarkable. Other: No free air or free fluid identified.  Slight anasarca. Musculoskeletal: Once again scattered lytic and sclerotic bone lesions  identified throughout the skeleton particularly along the lumbar spine and pelvis. There also significant lesion along the left greater trochanter extending to the neck of the left femur. Some soft tissue thickening identified. Some areas of the lumbar spine and left femur appear moth eaten. There also areas along the pelvis. IMPRESSION: Confluent abnormal soft tissue identified extending from the left upper lobe perihilar through the hilum and into the left side of the mediastinum with the associated occlusion of the left upper lobe bronchus adjacent to this area extending superiorly as well as significant narrowing or occlusion of the pulmonary vasculature. Additional reticulonodular areas extending and confluence fashion along the lingula which could represent additional spread of disease including lymphangitic spread. There scattered bilateral small lung nodules also seen, left-greater-than-right. Abnormal lymph nodes seen left thoracic inlet. Lytic scattered bone metastases identified with moth-eaten areas which include along the spine, ribs and proximal left femur. There is what may be a pathologic fracture of the left first rib. Some surrounding soft tissue. Bilateral nonobstructing renal stones. Numerous bilateral benign-appearing renal cystic foci. Nonobstructing renal stones. Nonspecific thickening of the adrenal glands. Attention on follow-up. Tiny liver lesion is too small to completely characterize. Simple attention on follow-up. Findings will be called to the ordering service by the Radiology physician assistant team Electronically Signed   By: Ranell Bring M.D.   On: 05/31/2023 17:17      IMPRESSION/PLAN: 1. 73 y.o. man with  painful osseous metastasis with epidural extension at L4-L5, secondary to newly diagnosed Stage IV NSCLC, adenocarcinoma.   Today, we talked to the patient and family about the findings and workup thus far. We discussed the natural history of metastatic NSCLC, adenocarcinoma and general treatment, highlighting the role of radiotherapy in the management of painful osseous metastases. We discussed the available radiation techniques, and focused on the details and logistics of delivery.  The recommendation is for a 2-week course of daily palliative radiotherapy to the painful osseous metastatic disease at L4-L5.  We reviewed the anticipated acute and late sequelae associated with radiation in this setting. The patient was encouraged to ask questions that were answered to his/her satisfaction.  At the conclusion of our conversation, the patient elects to proceed with the recommended 2-week course of daily palliative radiotherapy to the painful osseous metastatic disease at L4-L5.  He has freely signed written consent to proceed today in the office and a copy of this document will be placed in his medical record.  He is tentatively scheduled for CT simulation on Friday, 06/30/2023 at 3 PM so we will share our discussion with Dr. Sherrod and proceed with treatment planning accordingly, in anticipation of beginning his treatments in the very near future.  We enjoyed meeting him today and look forward to continuing to participate in his care.  We personally spent 60 minutes in this encounter including chart review, reviewing radiological studies, meeting face-to-face with the patient, entering orders and completing documentation.    Sabra MICAEL Rusk, PA-C    Donnice Barge, MD  [facility]  Radiation Oncology Direct Dial: [phone]  Fax: [phone] [email or web address]  Skype  LinkedIn

## 2023-06-22 NOTE — Progress Notes (Signed)
 Thoracic Location of Tumor / Histology: Left upper lung (nodule)  05/29/2023 Carolyn Combes, PA-C CT Chest Abdomen Pelvis with Contrast CLNICAL DATA: Bone metastases. * Tracking Code: BO *.   IMPRESSION: Confluent abnormal soft tissue identified extending from the left upper lobe perihilar through the hilum and into the left side of the mediastinum with the associated occlusion of the left upper lobe bronchus adjacent to this area extending superiorly as well as significant narrowing or occlusion of the pulmonary vasculature. Additional reticulonodular areas extending and confluence fashion along the lingula which could represent additional spread of disease including lymphangitic spread. There scattered bilateral small lung nodules also seen, left-greater-than-right. Abnormal lymph nodes seen left thoracic inlet. Lytic scattered bone metastases identified with moth-eaten areas which include along the spine, ribs and proximal left femur. There is what may be a pathologic fracture of the left first rib. Some surrounding soft tissue.  Bilateral nonobstructing renal stones. Numerous bilateral benign-appearing renal cystic foci. Nonobstructing renal stones.  Nonspecific thickening of the adrenal glands. Attention on follow-up.  Tiny liver lesion is too small to completely characterize. Simple attention on follow-up. Findings will be called to the ordering service by the Radiology physician assistant team   05/10/2023 Duwaine Pouch, NP MR Lumbar Spine without Contrast CLINICAL DATA: Low back pain with radiculopathy.   IMPRESSION: Findings of widespread bony metastasis with dominant deposit on the right at L4 and L5 where there is extraosseous tumor extension involving the right L4-5 foramen, epidural space, and the adjacent right lumbar plexus. Recommend postcontrast assessment and metastatic workup.   Past/Anticipated interventions by cardiothoracic surgery, if any:    06/12/2023 Dr. Brenna Cain Video Bronchoscopy

## 2023-06-22 NOTE — Progress Notes (Signed)
START ON PATHWAY REGIMEN - Non-Small Cell Lung     A cycle is every 21 days:     Pembrolizumab      Pemetrexed      Carboplatin   **Always confirm dose/schedule in your pharmacy ordering system**  Patient Characteristics: Stage IV Metastatic, Nonsquamous, Molecular Analysis Completed, Molecular Alteration Present and Targeted Therapy Exhausted OR KRAS G12C+ or HER2+ Present and No Prior Chemo/Immunotherapy OR No Alteration Present, Initial Chemotherapy/Immunotherapy, PS =  0, 1, No Alteration Present, No Alteration Present, Candidate for Immunotherapy, PD-L1 Expression Positive 1-49% (TPS) / Negative / Not Tested / Awaiting Test Results and Immunotherapy Candidate Therapeutic Status: Stage IV Metastatic Histology: Nonsquamous Cell Broad Molecular Profiling Status: Molecular Analysis Completed Molecular Analysis Results: No Alteration Present ECOG Performance Status: 1 Chemotherapy/Immunotherapy Line of Therapy: Initial Chemotherapy/Immunotherapy EGFR Exons 18-21 Mutation Testing Status: Completed and Negative ALK Fusion/Rearrangement Testing Status: Completed and Negative BRAF V600 Mutation Testing Status: Completed and Negative KRAS G12C Mutation Testing Status: Completed and Negative MET Exon 14 Mutation Testing Status: Completed and Negative RET Fusion/Rearrangement Testing Status: Completed and Negative HER2 Mutation Testing Status: Completed and Negative NTRK Fusion/Rearrangement Testing Status: Completed and Negative ROS1 Fusion/Rearrangement Testing Status: Completed and Negative Immunotherapy Candidate Status: Candidate for Immunotherapy PD-L1 Expression Status: PD-L1 Positive 1-49% (TPS) Intent of Therapy: Non-Curative / Palliative Intent, Discussed with Patient

## 2023-06-23 ENCOUNTER — Other Ambulatory Visit: Payer: Self-pay

## 2023-06-23 ENCOUNTER — Encounter (HOSPITAL_COMMUNITY): Payer: Self-pay

## 2023-06-24 ENCOUNTER — Other Ambulatory Visit: Payer: Self-pay

## 2023-06-24 NOTE — Progress Notes (Signed)
 The proposed treatment discussed in conference is for discussion purpose only and is not a binding recommendation.  The patients have not been physically examined, or presented with their treatment options.  Therefore, final treatment plans cannot be decided.

## 2023-06-26 ENCOUNTER — Other Ambulatory Visit: Payer: Self-pay | Admitting: Medical Oncology

## 2023-06-26 ENCOUNTER — Other Ambulatory Visit: Payer: Self-pay | Admitting: Internal Medicine

## 2023-06-26 ENCOUNTER — Encounter: Payer: Self-pay | Admitting: Internal Medicine

## 2023-06-26 ENCOUNTER — Telehealth: Payer: Self-pay | Admitting: Internal Medicine

## 2023-06-26 DIAGNOSIS — C3490 Malignant neoplasm of unspecified part of unspecified bronchus or lung: Secondary | ICD-10-CM

## 2023-06-26 NOTE — Progress Notes (Addendum)
 I met the pt face to face today at his consult with Dr.mohamed. He was accompanied by his wife, Jonathan Page. To complete the pt's oncology work up, pt requires a PET scan and a Brain MRI. PET scan is currently scheduled for 1/17.  Pt will receive a B12 shot and a Rx for folic acid  which he is aware he needs to start today in preparation for his chemotherapy.  Pt will receive a referral to palliative care for the pain he is having in his back. Dr Patrcia from Radiation Oncology was also present to consult on the patient for palliative radiation to his lumbar spine.  Upon completion of the appt, neither the pt nor his wife had any questions or concerns.

## 2023-06-26 NOTE — Progress Notes (Signed)
 DISCONTINUE ON PATHWAY REGIMEN - Non-Small Cell Lung     A cycle is every 21 days:     Pembrolizumab      Pemetrexed      Carboplatin    **Always confirm dose/schedule in your pharmacy ordering system**  PRIOR TREATMENT: [id number]: Pembrolizumab 200 mg + Pemetrexed 500 mg/m2 + Carboplatin  AUC=5 q21 Days x 4 Cycles  START OFF PATHWAY REGIMEN - Non-Small Cell Lung   [id number]:Cemiplimab  350 mg IV D1 + Carboplatin  AUC=6 IV D1 + Paclitaxel  200 mg/m2 IV D1 q21 Days x 4 Cycles:   A cycle is every 21 days:     Paclitaxel       Carboplatin       Cemiplimab -rwlc   **Always confirm dose/schedule in your pharmacy ordering system**  Patient Characteristics: Stage IV Metastatic, Nonsquamous, Molecular Analysis Completed, Molecular Alteration Present and Targeted Therapy Exhausted OR KRAS G12C+ or HER2+ Present and No Prior Chemo/Immunotherapy OR No Alteration Present, Initial Chemotherapy/Immunotherapy, PS =  0, 1, No Alteration Present, No Alteration Present, Candidate for Immunotherapy, PD-L1 Expression Positive 1-49% (TPS) / Negative / Not Tested / Awaiting Test Results and Immunotherapy Candidate Therapeutic Status: Stage IV Metastatic Histology: Nonsquamous Cell Broad Molecular Profiling Status: Molecular Analysis Completed Molecular Analysis Results: No Alteration Present ECOG Performance Status: 1 Chemotherapy/Immunotherapy Line of Therapy: Initial Chemotherapy/Immunotherapy EGFR Exons 18-21 Mutation Testing Status: Completed and Negative ALK Fusion/Rearrangement Testing Status: Completed and Negative BRAF V600 Mutation Testing Status: Completed and Negative KRAS G12C Mutation Testing Status: Completed and Negative MET Exon 14 Mutation Testing Status: Completed and Negative RET Fusion/Rearrangement Testing Status: Completed and Negative HER2 Mutation Testing Status: Completed and Negative NTRK Fusion/Rearrangement Testing Status: Completed and Negative ROS1 Fusion/Rearrangement Testing  Status: Completed and Negative Immunotherapy Candidate Status: Candidate for Immunotherapy PD-L1 Expression Status: PD-L1 Negative Intent of Therapy: Non-Curative / Palliative Intent, Discussed with Patient

## 2023-06-27 ENCOUNTER — Other Ambulatory Visit: Payer: Self-pay

## 2023-06-27 ENCOUNTER — Encounter: Payer: Self-pay | Admitting: Internal Medicine

## 2023-06-28 ENCOUNTER — Ambulatory Visit: Payer: Medicare HMO | Admitting: Primary Care

## 2023-06-28 NOTE — Progress Notes (Signed)
 I notified the pt of his brain MRI is on Saturday 1/18 at 3pm at [facility]. Pt is aware that he needs to be at the Radiology dept at 2:30 and he is to enter the building through the ER and let them know you are there for an imaging appt. Pt verbalized understanding.

## 2023-06-29 ENCOUNTER — Ambulatory Visit: Payer: Medicare HMO | Admitting: Primary Care

## 2023-06-29 ENCOUNTER — Other Ambulatory Visit: Payer: Self-pay | Admitting: Medical Oncology

## 2023-06-29 ENCOUNTER — Inpatient Hospital Stay: Payer: Medicare HMO

## 2023-06-29 ENCOUNTER — Encounter: Payer: Self-pay | Admitting: Internal Medicine

## 2023-06-29 DIAGNOSIS — C3490 Malignant neoplasm of unspecified part of unspecified bronchus or lung: Secondary | ICD-10-CM

## 2023-06-29 MED ORDER — PROCHLORPERAZINE MALEATE 10 MG PO TABS: 10.0000 mg | ORAL_TABLET | Freq: Four times a day (QID) | ORAL | 0 refills | Status: DC | PRN

## 2023-06-29 MED ORDER — LIDOCAINE-PRILOCAINE 2.5-2.5 % EX CREA: 1.0000 | TOPICAL_CREAM | CUTANEOUS | 1 refills | Status: DC | PRN

## 2023-06-29 NOTE — Progress Notes (Signed)
  Radiation Oncology         [phone]) [phone] ________________________________  Name: Jonathan Page MRN: [id number]  Date: 06/30/2023  DOB: 12-17-1950  SIMULATION AND TREATMENT PLANNING NOTE    ICD-10-CM   1. Non-small cell lung cancer metastatic to bone (HCC)  C34.90    C79.51       DIAGNOSIS:  73 y/o man with painful osseous metastasis with epidural extension at L4-L5, secondary to newly diagnosed Stage IV NSCLC, adenocarcinoma.   NARRATIVE:  The patient was brought to the CT Simulation planning suite.  Identity was confirmed.  All relevant records and images related to the planned course of therapy were reviewed.  The patient freely provided informed written consent to proceed with treatment after reviewing the details related to the planned course of therapy. The consent form was witnessed and verified by the simulation staff.  Then, the patient was set-up in a stable reproducible  supine position for radiation therapy.  CT images were obtained.  Surface markings were placed.  The CT images were loaded into the planning software.  Then the target and avoidance structures were contoured.  Treatment planning then occurred.  The radiation prescription was entered and confirmed.  Then, I designed and supervised the construction of a total of 3 medically necessary complex treatment devices consisting of leg positioner and MLC apertures to cover the treated L4-L5 area.  I have requested : 3D Simulation  I have requested a DVH of the following structures: kidneys, bowel, bladder, spinal cord and target.  PLAN:  The patient will receive 30 Gy in 10 fractions to the painful osseous metastatic disease at L4-L5.  ________________________________  Artist Pais. Kathrynn Running, M.D.

## 2023-06-29 NOTE — Progress Notes (Signed)
Compazine sent to [facility] for authorization.

## 2023-06-30 ENCOUNTER — Ambulatory Visit
Admission: RE | Admit: 2023-06-30 | Discharge: 2023-06-30 | Disposition: A | Discharge status: Home / Self Care | Payer: Medicare HMO | Source: Ambulatory Visit | Attending: Radiation Oncology | Admitting: Radiation Oncology

## 2023-06-30 ENCOUNTER — Telehealth: Payer: Self-pay

## 2023-06-30 ENCOUNTER — Encounter (HOSPITAL_COMMUNITY): Payer: Medicare HMO

## 2023-06-30 ENCOUNTER — Encounter: Payer: Medicare HMO | Admitting: Internal Medicine

## 2023-06-30 DIAGNOSIS — M899 Disorder of bone, unspecified: Secondary | ICD-10-CM | POA: Diagnosis not present

## 2023-06-30 DIAGNOSIS — Z87891 Personal history of nicotine dependence: Secondary | ICD-10-CM | POA: Diagnosis not present

## 2023-06-30 DIAGNOSIS — J432 Centrilobular emphysema: Secondary | ICD-10-CM | POA: Diagnosis not present

## 2023-06-30 DIAGNOSIS — K5900 Constipation, unspecified: Secondary | ICD-10-CM | POA: Diagnosis not present

## 2023-06-30 DIAGNOSIS — R042 Hemoptysis: Secondary | ICD-10-CM | POA: Diagnosis not present

## 2023-06-30 DIAGNOSIS — C3412 Malignant neoplasm of upper lobe, left bronchus or lung: Secondary | ICD-10-CM | POA: Diagnosis not present

## 2023-06-30 DIAGNOSIS — C3490 Malignant neoplasm of unspecified part of unspecified bronchus or lung: Secondary | ICD-10-CM

## 2023-06-30 DIAGNOSIS — D171 Benign lipomatous neoplasm of skin and subcutaneous tissue of trunk: Secondary | ICD-10-CM | POA: Diagnosis not present

## 2023-06-30 DIAGNOSIS — Z510 Encounter for antineoplastic radiation therapy: Secondary | ICD-10-CM | POA: Diagnosis not present

## 2023-06-30 DIAGNOSIS — G893 Neoplasm related pain (acute) (chronic): Secondary | ICD-10-CM | POA: Diagnosis not present

## 2023-06-30 DIAGNOSIS — C7951 Secondary malignant neoplasm of bone: Secondary | ICD-10-CM | POA: Diagnosis not present

## 2023-06-30 NOTE — Telephone Encounter (Signed)
Called to see if patient could come in early for his colonoscopy since Dr. Rhea Belton had a cancellation.  Spoke to pt and pt's wife, [name].   Pt's wife stated that pt was advised by the Cancer [facility] not to do the colonoscopy today and that the Oncology Nurse, Alejandro Mulling told them that she would cancel appt for them.  Pt also has a conflicting appt today at Cancer Center at 3:00 pm.  Pt will wait for Cancer Center to reschedule colonoscopy appointment.

## 2023-06-30 NOTE — Progress Notes (Unsigned)
[facility] OFFICE PROGRESS NOTE  Doreene Nest, NP [address] [place] [address]  DIAGNOSIS: stage IV (T3, N2, M1 C) non-small cell lung cancer, adenocarcinoma presented with large left upper lobe lung mass with left hilar and mediastinal invasion in addition to thoracic inlet lymphadenopathy as well as several metastatic bone lesions involving the spine, ribs as well as the right iliac bone diagnosed in December 2024.   Molecular studies by [id number] showed no actionable mutations and PD-L1 expression was negative.  PRIOR THERAPY: None   CURRENT THERAPY:  1) Palliative systemic chemotherapy and immunotherapy with carboplatin for an AUC of 5, Taxol 175 mg/m, and Libtayo IV every 3 weeks with Neulasta support.  (Not a candidate for Alimta due to CKD).  First dose on 07/03/23.  2) radiation to L4 and L5 to the care of Dr. Kathrynn Running. Final treatment expected on 07/21/23  INTERVAL HISTORY: Jonathan Page 73 y.o. male returns to the clinic today for a follow-up visit. The patient was unfortunately recently diagnosed with stage IV lung cancer.  The patient is expected to start palliative systemic chemotherapy today.  The patient has his chemo education class and does not have any questions.   In the interval since last being seen the patient establish care with Dr. Kathrynn Running who is planning on some palliative radiation to L4/L5.  The last day radiation is expected on 07/21/23.   The patient is scheduled to have a Port-A-Cath placed on 07/13/2023.  He has his prescription of Emla cream.   The patient is also expected to establish care with palliative care on 07/12/2023.  He is currently on Percocet and gabapentin for pain management. The patient needs a refill of his gabapentin. He also needs a refill of his lisinopril.    He is expected to complete the staging workup with his scans on 07/05/2023.  His brain MRI is tomorrow.   In the interval since last being seen the patient  denies any major changes in his health. He has some achiness in his legs. He continues to endorse hoarseness for the last few weeks. He denies any fever, chills, or night sweats.  The patient also reported a lack of appetite but has been eating well and supplementing with protein drinks. He reports intermittent shortness of breath. Denies significant cough unless clearing throat. He denies any hemoptysis or chest pain. He denies any nausea, vomiting, diarrhea, or constipation.  Denies any headache or visual changes.  He is here today for evaluation repeat blood work before undergoing cycle #1.  MEDICAL HISTORY: Past Medical History:  Diagnosis Date   Allergy    Cancer (HCC)    Cellulitis 02/21/2018   Concussion syndrome    Diabetes (HCC)    Frequent headaches    Hypertension     ALLERGIES:  has no known allergies.  MEDICATIONS:  Current Outpatient Medications  Medication Sig Dispense Refill   aspirin EC 81 MG tablet Take 81 mg by mouth daily.     Blood Glucose Monitoring Suppl (ACCU-CHEK AVIVA PLUS) w/Device KIT Check blood sugar before breakfast, lunch and bedtime and as directed. Dx E11.65 1 kit 0   empagliflozin (JARDIANCE) 10 MG TABS tablet Take 1 tablet (10 mg total) by mouth daily before breakfast. 30 tablet 3   gabapentin (NEURONTIN) 300 MG capsule Take 1 capsule (300 mg total) by mouth at bedtime. 30 capsule 0   glipiZIDE (GLUCOTROL XL) 10 MG 24 hr tablet Take 1 tablet (10 mg  total) by mouth daily with breakfast. for diabetes. 90 tablet 3   glucose blood (ACCU-CHEK AVIVA PLUS) test strip Check blood sugar before breakfast, lunch and bedtime and as directed. Dx E11.65 300 each 1   insulin NPH-regular Human (70-30) 100 UNIT/ML injection Inject 5 Units into the skin 2 (two) times daily with a meal.     Lancets (ACCU-CHEK SOFT TOUCH) lancets Check blood sugar before breakfast, lunch and bedtime and as directed. Dx E11.65 300 each 1   lidocaine-prilocaine (EMLA) cream Apply 1  Application topically as needed. 30 g 1   lisinopril (ZESTRIL) 10 MG tablet Take 1 tablet (10 mg total) by mouth daily. 90 tablet 1   oxyCODONE-acetaminophen (PERCOCET/ROXICET) 5-325 MG tablet Take 1 tablet by mouth every 8 (eight) hours as needed for severe pain (pain score 7-10). 30 tablet 0   pravastatin (PRAVACHOL) 40 MG tablet Take 1 tablet (40 mg total) by mouth daily. For cholesterol. 90 tablet 3   prochlorperazine (COMPAZINE) 10 MG tablet Take 1 tablet (10 mg total) by mouth every 6 (six) hours as needed for nausea or vomiting. 30 tablet 0   No current facility-administered medications for this visit.    SURGICAL HISTORY:  Past Surgical History:  Procedure Laterality Date   APPENDECTOMY  1960   BRONCHIAL BIOPSY  06/12/2023   Procedure: BRONCHIAL BIOPSIES;  Surgeon: Josephine Igo, DO;  Location: [facility];  Service: Cardiopulmonary;;   BRONCHIAL BRUSHINGS  06/12/2023   Procedure: BRONCHIAL BRUSHINGS;  Surgeon: Josephine Igo, DO;  Location: [facility];  Service: Cardiopulmonary;;   COLONOSCOPY     VIDEO BRONCHOSCOPY Bilateral 06/12/2023   Procedure: VIDEO BRONCHOSCOPY;  Surgeon: Josephine Igo, DO;  Location: [facility];  Service: Cardiopulmonary;  Laterality: Bilateral;   WISDOM TOOTH EXTRACTION      REVIEW OF SYSTEMS:   Review of Systems  Constitutional: Positive for fatigue and decreased appetite.  Negative for chills,  fever and unexpected weight change.  HENT: Positive for hoarseness. Negative for mouth sores, nosebleeds, sore throat and trouble swallowing.   Eyes: Negative for eye problems and icterus.  Respiratory: Positive for intermittent shortness of breath. Negative for cough, hemoptysis, and wheezing.   Cardiovascular: Negative for chest pain and leg swelling.  Gastrointestinal: Negative for abdominal pain, constipation, diarrhea, nausea and vomiting.  Genitourinary: Negative for bladder incontinence, difficulty urinating, dysuria, frequency and  hematuria.   Musculoskeletal: Positive for leg weakness and achiness. Negative for back pain, gait problem, neck pain and neck stiffness.  Skin: Negative for itching and rash.  Neurological: Negative for dizziness, extremity weakness, gait problem, headaches, light-headedness and seizures.  Hematological: Negative for adenopathy. Does not bruise/bleed easily.  Psychiatric/Behavioral: Negative for confusion, depression and sleep disturbance. The patient is not nervous/anxious.     PHYSICAL EXAMINATION:  There were no vitals taken for this visit.  ECOG PERFORMANCE STATUS: 1  Physical Exam  Constitutional: Oriented to person, place, and time and thin appearing male and in no distress.  HENT:  Head: Normocephalic and atraumatic.  Mouth/Throat: Oropharynx is clear and moist. No oropharyngeal exudate.  Eyes: Conjunctivae are normal. Right eye exhibits no discharge. Left eye exhibits no discharge. No scleral icterus.  Neck: Normal range of motion. Neck supple.  Cardiovascular: Normal rate, regular rhythm, normal heart sounds and intact distal pulses.   Pulmonary/Chest: Effort normal and breath sounds normal. No respiratory distress. No wheezes. No rales.  Abdominal: Soft. Bowel sounds are normal. Exhibits no distension and no mass. There is no tenderness.  Musculoskeletal: Normal range of motion. Exhibits no edema.  Lymphadenopathy:    No cervical adenopathy.  Neurological: Alert and oriented to person, place, and time. Exhibits muscle wasting. Gait normal. Coordination normal.  Skin: Skin is warm and dry. No rash noted. Not diaphoretic. No erythema. No pallor.  Psychiatric: Mood, memory and judgment normal.  Vitals reviewed.  LABORATORY DATA: Lab Results  Component Value Date   WBC 8.2 06/22/2023   HGB 12.9 (L) 06/22/2023   HCT 38.9 (L) 06/22/2023   MCV 91.5 06/22/2023   PLT 292 06/22/2023      Chemistry      Component Value Date/Time   NA 137 06/22/2023 1353   K 4.8 06/22/2023  1353   CL 101 06/22/2023 1353   CO2 30 06/22/2023 1353   BUN 31 (H) 06/22/2023 1353   CREATININE 1.70 (H) 06/22/2023 1353      Component Value Date/Time   CALCIUM 11.7 (H) 06/22/2023 1353   ALKPHOS 207 (H) 06/22/2023 1353   AST 14 (L) 06/22/2023 1353   ALT 13 06/22/2023 1353   BILITOT 0.5 06/22/2023 1353       RADIOGRAPHIC STUDIES:  No results found.   ASSESSMENT/PLAN:  Is a very pleasant 73 year old African-American male diagnosed with stage IV (T3, N2, M1 C) non-small cell lung cancer, adenocarcinoma.  He presented with a left upper lobe lung mass with left hilar mediastinal invasion in addition to thoracic inlet lymphadenopathy as well as several metastatic bone lesions involving the spine, ribs, and right iliac bone.  The patient was diagnosed in December 2024.  His staging PET scan is scheduled for later this week on 07/05/2023.  His brain MRI is pending.  His molecular studies by [id number] showed no actionable mutations and his PD-L1 expression is negative.  The patient is expected to undergo palliative radiation to L4/L5 to the care of Dr. Kathrynn Running and this is expected to conclude on 07/21/2023.  Because of the patient's CKD, the patient is undergoing palliative systemic chemotherapy and immunotherapy with carboplatin for an AUC of 5, paclitaxel 175 mg/m, and Libtayo with Neulasta support. His first dose is expected today on 07/03/2023.   Labs were reviewed. He has CKD. He is ok to treat with creatinine 1.87. His calcium is 11.3. We will administer 500 ccs of IVF for creatinine and hypercalcemia. The patient does have CKD.  Recommend that he proceed with cycle #1 today as scheduled.  We will see him back for follow-up visit in 1 week for a 1 week follow-up visit to manage any adverse side effects of treatment.   I explained to the patient the purpose of the G-CSF injection with Neulasta and advised him to take Claritin daily for 4 to 7 days with possible myalgias and  arthralgias.  We will continue to monitor his labs weekly while he is undergoing chemotherapy.  He is expected to have a Port-A-Cath placed next week.  He is also expected to establish care with palliative care on 07/12/2023.  For pain management he will continue taking gabapentin and Percocet. I refilled his gabapentin. We will reach out to his PCP for his refill of lisinopril.   Review how to take Emla cream.  Will follow-up on the results of his brain MRI and PET scan.  The patient was advised to call immediately if he has any concerning symptoms in the interval. The patient voices understanding of current disease status and treatment options and is in agreement with the current care plan. All questions were answered.  The patient knows to call the clinic with any problems, questions or concerns. We can certainly see the patient much sooner if necessary   No orders of the defined types were placed in this encounter.    . The total time spent in the appointment was 20-29 minutes  Jonathan Mizrahi L Kiaira Pointer, PA-C 06/30/23

## 2023-06-30 NOTE — Telephone Encounter (Signed)
Noted  

## 2023-07-01 ENCOUNTER — Ambulatory Visit (HOSPITAL_COMMUNITY): Admission: RE | Admit: 2023-07-01 | Payer: Medicare HMO | Source: Ambulatory Visit

## 2023-07-03 ENCOUNTER — Inpatient Hospital Stay: Payer: Medicare HMO | Admitting: Physician Assistant

## 2023-07-03 ENCOUNTER — Other Ambulatory Visit: Payer: Self-pay | Admitting: Physician Assistant

## 2023-07-03 ENCOUNTER — Encounter: Payer: Self-pay | Admitting: Internal Medicine

## 2023-07-03 ENCOUNTER — Inpatient Hospital Stay (HOSPITAL_BASED_OUTPATIENT_CLINIC_OR_DEPARTMENT_OTHER): Payer: Medicare HMO

## 2023-07-03 ENCOUNTER — Inpatient Hospital Stay: Payer: Medicare HMO

## 2023-07-03 VITALS — BP 111/69 | HR 79 | Resp 16

## 2023-07-03 VITALS — BP 108/72 | HR 101 | Temp 98.3°F | Resp 16 | Wt 148.1 lb

## 2023-07-03 DIAGNOSIS — C7951 Secondary malignant neoplasm of bone: Secondary | ICD-10-CM

## 2023-07-03 DIAGNOSIS — K5900 Constipation, unspecified: Secondary | ICD-10-CM | POA: Diagnosis not present

## 2023-07-03 DIAGNOSIS — G893 Neoplasm related pain (acute) (chronic): Secondary | ICD-10-CM | POA: Diagnosis not present

## 2023-07-03 DIAGNOSIS — M899 Disorder of bone, unspecified: Secondary | ICD-10-CM | POA: Diagnosis not present

## 2023-07-03 DIAGNOSIS — Z5111 Encounter for antineoplastic chemotherapy: Secondary | ICD-10-CM

## 2023-07-03 DIAGNOSIS — R042 Hemoptysis: Secondary | ICD-10-CM | POA: Diagnosis not present

## 2023-07-03 DIAGNOSIS — Z5112 Encounter for antineoplastic immunotherapy: Secondary | ICD-10-CM | POA: Insufficient documentation

## 2023-07-03 DIAGNOSIS — C3490 Malignant neoplasm of unspecified part of unspecified bronchus or lung: Secondary | ICD-10-CM | POA: Diagnosis not present

## 2023-07-03 DIAGNOSIS — J432 Centrilobular emphysema: Secondary | ICD-10-CM | POA: Diagnosis not present

## 2023-07-03 DIAGNOSIS — D171 Benign lipomatous neoplasm of skin and subcutaneous tissue of trunk: Secondary | ICD-10-CM | POA: Diagnosis not present

## 2023-07-03 DIAGNOSIS — Z510 Encounter for antineoplastic radiation therapy: Secondary | ICD-10-CM | POA: Diagnosis not present

## 2023-07-03 DIAGNOSIS — C3412 Malignant neoplasm of upper lobe, left bronchus or lung: Secondary | ICD-10-CM | POA: Diagnosis not present

## 2023-07-03 LAB — CMP (CANCER CENTER ONLY)
ALT: 8 U/L (ref 0–44)
AST: 14 U/L — ABNORMAL LOW (ref 15–41)
Albumin: 4.2 g/dL (ref 3.5–5.0)
Alkaline Phosphatase: 177 U/L — ABNORMAL HIGH (ref 38–126)
Anion gap: 9 (ref 5–15)
BUN: 25 mg/dL — ABNORMAL HIGH (ref 8–23)
CO2: 27 mmol/L (ref 22–32)
Calcium: 11.3 mg/dL — ABNORMAL HIGH (ref 8.9–10.3)
Chloride: 102 mmol/L (ref 98–111)
Creatinine: 1.87 mg/dL — ABNORMAL HIGH (ref 0.61–1.24)
GFR, Estimated: 38 mL/min — ABNORMAL LOW (ref >60)
Glucose, Bld: 216 mg/dL — ABNORMAL HIGH (ref 70–99)
Potassium: 3.9 mmol/L (ref 3.5–5.1)
Sodium: 138 mmol/L (ref 135–145)
Total Bilirubin: 0.5 mg/dL (ref 0.0–1.2)
Total Protein: 7.1 g/dL (ref 6.5–8.1)

## 2023-07-03 LAB — CBC WITH DIFFERENTIAL (CANCER CENTER ONLY)
Abs Immature Granulocytes: 0.03 10*3/uL (ref 0.00–0.07)
Basophils Absolute: 0.1 10*3/uL (ref 0.0–0.1)
Basophils Relative: 1 %
Eosinophils Absolute: 0.1 10*3/uL (ref 0.0–0.5)
Eosinophils Relative: 1 %
HCT: 37.3 % — ABNORMAL LOW (ref 39.0–52.0)
Hemoglobin: 12.2 g/dL — ABNORMAL LOW (ref 13.0–17.0)
Immature Granulocytes: 0 %
Lymphocytes Relative: 22 %
Lymphs Abs: 1.9 10*3/uL (ref 0.7–4.0)
MCH: 29.3 pg (ref 26.0–34.0)
MCHC: 32.7 g/dL (ref 30.0–36.0)
MCV: 89.4 fL (ref 80.0–100.0)
Monocytes Absolute: 0.6 10*3/uL (ref 0.1–1.0)
Monocytes Relative: 7 %
Neutro Abs: 6.1 10*3/uL (ref 1.7–7.7)
Neutrophils Relative %: 69 %
Platelet Count: 251 10*3/uL (ref 150–400)
RBC: 4.17 MIL/uL — ABNORMAL LOW (ref 4.22–5.81)
RDW: 14.6 % (ref 11.5–15.5)
WBC Count: 8.8 10*3/uL (ref 4.0–10.5)
nRBC: 0 % (ref 0.0–0.2)

## 2023-07-03 MED ORDER — SODIUM CHLORIDE 0.9 % IV SOLN: INTRAVENOUS | Status: DC

## 2023-07-03 MED ORDER — SODIUM CHLORIDE 0.9 % IV SOLN: Freq: Once | INTRAVENOUS | Status: AC

## 2023-07-03 MED ORDER — CEMIPLIMAB-RWLC CHEMO INJECTION 350 MG/7ML
350.0000 mg | Freq: Once | INTRAVENOUS | Status: AC
  Administered 2023-07-03: 350 mg via INTRAVENOUS
  Filled 2023-07-03: qty 7

## 2023-07-03 MED ORDER — SODIUM CHLORIDE 0.9 % IV SOLN
150.0000 mg | Freq: Once | INTRAVENOUS | Status: AC
  Administered 2023-07-03: 150 mg via INTRAVENOUS
  Filled 2023-07-03: qty 150

## 2023-07-03 MED ORDER — FAMOTIDINE IN NACL 20-0.9 MG/50ML-% IV SOLN
20.0000 mg | Freq: Once | INTRAVENOUS | Status: AC
  Administered 2023-07-03: 20 mg via INTRAVENOUS
  Filled 2023-07-03: qty 50

## 2023-07-03 MED ORDER — CARBOPLATIN CHEMO INJECTION 450 MG/45ML
312.0000 mg | Freq: Once | INTRAVENOUS | Status: AC
  Administered 2023-07-03: 310 mg via INTRAVENOUS
  Filled 2023-07-03: qty 30.64

## 2023-07-03 MED ORDER — DIPHENHYDRAMINE HCL 50 MG/ML IJ SOLN
50.0000 mg | Freq: Once | INTRAMUSCULAR | Status: AC
  Administered 2023-07-03: 50 mg via INTRAVENOUS
  Filled 2023-07-03: qty 1

## 2023-07-03 MED ORDER — GABAPENTIN 300 MG PO CAPS: 300.0000 mg | ORAL_CAPSULE | Freq: Every day | ORAL | 1 refills | Status: DC

## 2023-07-03 MED ORDER — SODIUM CHLORIDE 0.9 % IV SOLN
175.0000 mg/m2 | Freq: Once | INTRAVENOUS | Status: AC
  Administered 2023-07-03: 324 mg via INTRAVENOUS
  Filled 2023-07-03: qty 54

## 2023-07-03 MED ORDER — PALONOSETRON HCL INJECTION 0.25 MG/5ML
0.2500 mg | Freq: Once | INTRAVENOUS | Status: AC
  Administered 2023-07-03: 0.25 mg via INTRAVENOUS
  Filled 2023-07-03: qty 5

## 2023-07-03 MED ORDER — DEXAMETHASONE SODIUM PHOSPHATE 10 MG/ML IJ SOLN
10.0000 mg | Freq: Once | INTRAMUSCULAR | Status: AC
  Administered 2023-07-03: 10 mg via INTRAVENOUS
  Filled 2023-07-03: qty 1

## 2023-07-03 NOTE — Patient Instructions (Signed)
[facility] - A DEPT OF [facility]  Discharge Instructions: Thank you for choosing [facility] to provide your oncology and hematology care.   If you have a lab appointment with the Cancer Center, please go directly to the Cancer Center and check in at the registration area.   Wear comfortable clothing and clothing appropriate for easy access to any Portacath or PICC line.   We strive to give you quality time with your provider. You may need to reschedule your appointment if you arrive late (15 or more minutes).  Arriving late affects you and other patients whose appointments are after yours.  Also, if you miss three or more appointments without notifying the office, you may be dismissed from the clinic at the provider's discretion.      For prescription refill requests, have your pharmacy contact our office and allow 72 hours for refills to be completed.    Today you received the following chemotherapy and/or immunotherapy agents Libtayo, Paclitaxel, Carboplatin      To help prevent nausea and vomiting after your treatment, we encourage you to take your nausea medication as directed.  BELOW ARE SYMPTOMS THAT SHOULD BE REPORTED IMMEDIATELY: *FEVER GREATER THAN 100.4 F (38 C) OR HIGHER *CHILLS OR SWEATING *NAUSEA AND VOMITING THAT IS NOT CONTROLLED WITH YOUR NAUSEA MEDICATION *UNUSUAL SHORTNESS OF BREATH *UNUSUAL BRUISING OR BLEEDING *URINARY PROBLEMS (pain or burning when urinating, or frequent urination) *BOWEL PROBLEMS (unusual diarrhea, constipation, pain near the anus) TENDERNESS IN MOUTH AND THROAT WITH OR WITHOUT PRESENCE OF ULCERS (sore throat, sores in mouth, or a toothache) UNUSUAL RASH, SWELLING OR PAIN  UNUSUAL VAGINAL DISCHARGE OR ITCHING   Items with * indicate a potential emergency and should be followed up as soon as possible or go to the Emergency Department if any problems should occur.  Please show the CHEMOTHERAPY ALERT  CARD or IMMUNOTHERAPY ALERT CARD at check-in to the Emergency Department and triage nurse.  Should you have questions after your visit or need to cancel or reschedule your appointment, please contact [facility] - A DEPT OF [facility]  Dept: [phone]  and follow the prompts.  Office hours are 8:00 a.m. to 4:30 p.m. Monday - Friday. Please note that voicemails left after 4:00 p.m. may not be returned until the following business day.  We are closed weekends and major holidays. You have access to a nurse at all times for urgent questions. Please call the main number to the clinic Dept: [phone] and follow the prompts.   For any non-urgent questions, you may also contact your provider using MyChart. We now offer e-Visits for anyone 61 and older to request care online for non-urgent symptoms. For details visit [email or web address].   Also download the MyChart app! Go to the app store, search "MyChart", open the app, select [place], and log in with your MyChart username and password.  Cemiplimab Injection (Libtayo) What is this medication? CEMIPLIMAB (se MIP li mab) treats skin cancer and lung cancer. It works by helping your immune system slow or stop the spread of cancer cells. It is a monoclonal antibody. This medicine may be used for other purposes; ask your health care provider or pharmacist if you have questions. COMMON BRAND NAME(S): LIBTAYO What should I tell my care team before I take this medication? They need to know if you have any of these conditions: Allogeneic stem cell transplant (uses someone  else's stem cells) Autoimmune diseases, such as Crohn disease, ulcerative colitis, lupus History of chest radiation Nervous system problems, such as Guillain-Barre syndrome or myasthenia gravis Organ transplant An unusual or allergic reaction to cemiplimab, other medications, foods, dyes, or preservatives Pregnant or trying to get  pregnant Breastfeeding How should I use this medication? This medication is infused into a vein. It is given by your care team in a hospital or clinic setting. A special MedGuide will be given to you before each treatment. Be sure to read this information carefully each time. Talk to your care team about the use of this medication in children. Special care may be needed. Overdosage: If you think you have taken too much of this medicine contact a poison control center or emergency room at once. NOTE: This medicine is only for you. Do not share this medicine with others. What if I miss a dose? Keep appointments for follow-up doses. It is important not to miss your dose. Call your care team if you are unable to keep an appointment. What may interact with this medication? Interactions have not been studied. This list may not describe all possible interactions. Give your health care provider a list of all the medicines, herbs, non-prescription drugs, or dietary supplements you use. Also tell them if you smoke, drink alcohol, or use illegal drugs. Some items may interact with your medicine. What should I watch for while using this medication? Visit your care team for regular checks on your progress. It may be some time before you see the benefit from this medication. You may need blood work done while taking this medication. This medication may cause serious skin reactions. They can happen weeks to months after starting the medication. Contact your care team right away if you notice fevers or flu-like symptoms with a rash. The rash may be red or purple and then turn into blisters or peeling of the skin. You may also notice a red rash with swelling of the face, lips, or lymph nodes in your neck or under your arms. Tell your care team right away if you have any change in your eyesight. Talk to your care team if you may be pregnant. You will need a negative pregnancy test before starting this medication.  Contraception is recommended while taking this medication and for 4 months after the last dose. Your care team can help you find the option that works for you. Do not breastfeed while taking this medication and for at least 4 months after the last dose. What side effects may I notice from receiving this medication? Side effects that you should report to your care team as soon as possible: Allergic reactions--skin rash, itching, hives, swelling of the face, lips, tongue, or throat Dry cough, shortness of breath or trouble breathing Eye pain, redness, irritation, or discharge with blurry or decreased vision Heart muscle inflammation--unusual weakness or fatigue, shortness of breath, chest pain, fast or irregular heartbeat, dizziness, swelling of the ankles, feet, or hands Hormone gland problems--headache, sensitivity to light, unusual weakness or fatigue, dizziness, fast or irregular heartbeat, increased sensitivity to cold or heat, excessive sweating, constipation, hair loss, increased thirst or amount of urine, tremors or shaking, irritability Infusion reactions--chest pain, shortness of breath or trouble breathing, feeling faint or lightheaded Kidney injury (glomerulonephritis)--decrease in the amount of urine, red or dark brown urine, foamy or bubbly urine, swelling of the ankles, hands, or feet Liver injury--right upper belly pain, loss of appetite, nausea, light-colored stool, dark yellow or brown  urine, yellowing skin or eyes, unusual weakness or fatigue Pain, tingling, or numbness in the hands or feet, muscle weakness, change in vision, confusion or trouble speaking, loss of balance or coordination, trouble walking, seizures Rash, fever, and swollen lymph nodes Redness, blistering, peeling, or loosening of the skin, including inside the mouth Sudden or severe stomach pain, bloody diarrhea, fever, nausea, vomiting Side effects that usually do not require medical attention (report these to your  care team if they continue or are bothersome): Bone, joint, or muscle pain Diarrhea Fatigue Loss of appetite Nausea Skin rash This list may not describe all possible side effects. Call your doctor for medical advice about side effects. You may report side effects to FDA at 1-800-FDA-1088. Where should I keep my medication? This medication is given in a hospital or clinic and will not be stored at home. NOTE: This sheet is a summary. It may not cover all possible information. If you have questions about this medicine, talk to your doctor, pharmacist, or health care provider.  2024 Elsevier/Gold Standard (2022-07-18 00:00:00)  Paclitaxel Injection What is this medication? PACLITAXEL (PAK li TAX el) treats some types of cancer. It works by slowing down the growth of cancer cells. This medicine may be used for other purposes; ask your health care provider or pharmacist if you have questions. COMMON BRAND NAME(S): Onxol, Taxol What should I tell my care team before I take this medication? They need to know if you have any of these conditions: Heart disease Liver disease Low white blood cell levels An unusual or allergic reaction to paclitaxel, other medications, foods, dyes, or preservatives If you or your partner are pregnant or trying to get pregnant Breast-feeding How should I use this medication? This medication is injected into a vein. It is given by your care team in a hospital or clinic setting. Talk to your care team about the use of this medication in children. While it may be given to children for selected conditions, precautions do apply. Overdosage: If you think you have taken too much of this medicine contact a poison control center or emergency room at once. NOTE: This medicine is only for you. Do not share this medicine with others. What if I miss a dose? Keep appointments for follow-up doses. It is important not to miss your dose. Call your care team if you are unable to  keep an appointment. What may interact with this medication? Do not take this medication with any of the following: Live virus vaccines Other medications may affect the way this medication works. Talk with your care team about all of the medications you take. They may suggest changes to your treatment plan to lower the risk of side effects and to make sure your medications work as intended. This list may not describe all possible interactions. Give your health care provider a list of all the medicines, herbs, non-prescription drugs, or dietary supplements you use. Also tell them if you smoke, drink alcohol, or use illegal drugs. Some items may interact with your medicine. What should I watch for while using this medication? Your condition will be monitored carefully while you are receiving this medication. You may need blood work while taking this medication. This medication may make you feel generally unwell. This is not uncommon as chemotherapy can affect healthy cells as well as cancer cells. Report any side effects. Continue your course of treatment even though you feel ill unless your care team tells you to stop. This medication can cause  serious allergic reactions. To reduce the risk, your care team may give you other medications to take before receiving this one. Be sure to follow the directions from your care team. This medication may increase your risk of getting an infection. Call your care team for advice if you get a fever, chills, sore throat, or other symptoms of a cold or flu. Do not treat yourself. Try to avoid being around people who are sick. This medication may increase your risk to bruise or bleed. Call your care team if you notice any unusual bleeding. Be careful brushing or flossing your teeth or using a toothpick because you may get an infection or bleed more easily. If you have any dental work done, tell your dentist you are receiving this medication. Talk to your care team if  you may be pregnant. Serious birth defects can occur if you take this medication during pregnancy. Talk to your care team before breastfeeding. Changes to your treatment plan may be needed. What side effects may I notice from receiving this medication? Side effects that you should report to your care team as soon as possible: Allergic reactions--skin rash, itching, hives, swelling of the face, lips, tongue, or throat Heart rhythm changes--fast or irregular heartbeat, dizziness, feeling faint or lightheaded, chest pain, trouble breathing Increase in blood pressure Infection--fever, chills, cough, sore throat, wounds that don't heal, pain or trouble when passing urine, general feeling of discomfort or being unwell Low blood pressure--dizziness, feeling faint or lightheaded, blurry vision Low red blood cell level--unusual weakness or fatigue, dizziness, headache, trouble breathing Painful swelling, warmth, or redness of the skin, blisters or sores at the infusion site Pain, tingling, or numbness in the hands or feet Slow heartbeat--dizziness, feeling faint or lightheaded, confusion, trouble breathing, unusual weakness or fatigue Unusual bruising or bleeding Side effects that usually do not require medical attention (report to your care team if they continue or are bothersome): Diarrhea Hair loss Joint pain Loss of appetite Muscle pain Nausea Vomiting This list may not describe all possible side effects. Call your doctor for medical advice about side effects. You may report side effects to FDA at 1-800-FDA-1088. Where should I keep my medication? This medication is given in a hospital or clinic. It will not be stored at home. NOTE: This sheet is a summary. It may not cover all possible information. If you have questions about this medicine, talk to your doctor, pharmacist, or health care provider.  2024 Elsevier/Gold Standard (2021-10-19 00:00:00)  Carboplatin Injection What is this  medication? CARBOPLATIN (KAR boe pla tin) treats some types of cancer. It works by slowing down the growth of cancer cells. This medicine may be used for other purposes; ask your health care provider or pharmacist if you have questions. COMMON BRAND NAME(S): Paraplatin What should I tell my care team before I take this medication? They need to know if you have any of these conditions: Blood disorders Hearing problems Kidney disease Recent or ongoing radiation therapy An unusual or allergic reaction to carboplatin, cisplatin, other medications, foods, dyes, or preservatives Pregnant or trying to get pregnant Breast-feeding How should I use this medication? This medication is injected into a vein. It is given by your care team in a hospital or clinic setting. Talk to your care team about the use of this medication in children. Special care may be needed. Overdosage: If you think you have taken too much of this medicine contact a poison control center or emergency room at once. NOTE: This  medicine is only for you. Do not share this medicine with others. What if I miss a dose? Keep appointments for follow-up doses. It is important not to miss your dose. Call your care team if you are unable to keep an appointment. What may interact with this medication? Medications for seizures Some antibiotics, such as amikacin, gentamicin, neomycin, streptomycin, tobramycin Vaccines This list may not describe all possible interactions. Give your health care provider a list of all the medicines, herbs, non-prescription drugs, or dietary supplements you use. Also tell them if you smoke, drink alcohol, or use illegal drugs. Some items may interact with your medicine. What should I watch for while using this medication? Your condition will be monitored carefully while you are receiving this medication. You may need blood work while taking this medication. This medication may make you feel generally unwell. This  is not uncommon, as chemotherapy can affect healthy cells as well as cancer cells. Report any side effects. Continue your course of treatment even though you feel ill unless your care team tells you to stop. In some cases, you may be given additional medications to help with side effects. Follow all directions for their use. This medication may increase your risk of getting an infection. Call your care team for advice if you get a fever, chills, sore throat, or other symptoms of a cold or flu. Do not treat yourself. Try to avoid being around people who are sick. Avoid taking medications that contain aspirin, acetaminophen, ibuprofen, naproxen, or ketoprofen unless instructed by your care team. These medications may hide a fever. Be careful brushing or flossing your teeth or using a toothpick because you may get an infection or bleed more easily. If you have any dental work done, tell your dentist you are receiving this medication. Talk to your care team if you wish to become pregnant or think you might be pregnant. This medication can cause serious birth defects. Talk to your care team about effective forms of contraception. Do not breast-feed while taking this medication. What side effects may I notice from receiving this medication? Side effects that you should report to your care team as soon as possible: Allergic reactions--skin rash, itching, hives, swelling of the face, lips, tongue, or throat Infection--fever, chills, cough, sore throat, wounds that don't heal, pain or trouble when passing urine, general feeling of discomfort or being unwell Low red blood cell level--unusual weakness or fatigue, dizziness, headache, trouble breathing Pain, tingling, or numbness in the hands or feet, muscle weakness, change in vision, confusion or trouble speaking, loss of balance or coordination, trouble walking, seizures Unusual bruising or bleeding Side effects that usually do not require medical attention  (report to your care team if they continue or are bothersome): Hair loss Nausea Unusual weakness or fatigue Vomiting This list may not describe all possible side effects. Call your doctor for medical advice about side effects. You may report side effects to FDA at 1-800-FDA-1088. Where should I keep my medication? This medication is given in a hospital or clinic. It will not be stored at home. NOTE: This sheet is a summary. It may not cover all possible information. If you have questions about this medicine, talk to your doctor, pharmacist, or health care provider.  2024 Elsevier/Gold Standard (2021-09-21 00:00:00)

## 2023-07-03 NOTE — Progress Notes (Signed)
Per Cassie, PA okay to treat today with Creatinine 1.87.

## 2023-07-04 ENCOUNTER — Encounter: Payer: Self-pay | Admitting: Internal Medicine

## 2023-07-04 ENCOUNTER — Ambulatory Visit (HOSPITAL_COMMUNITY): Payer: Medicare HMO

## 2023-07-04 DIAGNOSIS — Z87891 Personal history of nicotine dependence: Secondary | ICD-10-CM | POA: Diagnosis not present

## 2023-07-04 DIAGNOSIS — K5900 Constipation, unspecified: Secondary | ICD-10-CM | POA: Diagnosis not present

## 2023-07-04 DIAGNOSIS — G893 Neoplasm related pain (acute) (chronic): Secondary | ICD-10-CM | POA: Diagnosis not present

## 2023-07-04 DIAGNOSIS — C3412 Malignant neoplasm of upper lobe, left bronchus or lung: Secondary | ICD-10-CM | POA: Diagnosis not present

## 2023-07-04 DIAGNOSIS — J432 Centrilobular emphysema: Secondary | ICD-10-CM | POA: Diagnosis not present

## 2023-07-04 DIAGNOSIS — C7951 Secondary malignant neoplasm of bone: Secondary | ICD-10-CM | POA: Diagnosis not present

## 2023-07-04 DIAGNOSIS — M899 Disorder of bone, unspecified: Secondary | ICD-10-CM | POA: Diagnosis not present

## 2023-07-04 DIAGNOSIS — Z510 Encounter for antineoplastic radiation therapy: Secondary | ICD-10-CM | POA: Diagnosis not present

## 2023-07-04 DIAGNOSIS — D171 Benign lipomatous neoplasm of skin and subcutaneous tissue of trunk: Secondary | ICD-10-CM | POA: Diagnosis not present

## 2023-07-04 DIAGNOSIS — R042 Hemoptysis: Secondary | ICD-10-CM | POA: Diagnosis not present

## 2023-07-04 NOTE — Telephone Encounter (Signed)
Attempted to call pt to see how he did with his recent treatment.  Left message to call back & speak with his MD RN.

## 2023-07-04 NOTE — Telephone Encounter (Signed)
-----   Message from Nurse Currie Paris sent at 07/03/2023  4:25 PM EST ----- Regarding: First time Libtayo/Paclitaxel/Carboplatin. Pt of Jonathan Page First time Libtayo/Paclitaxel/Carboplatin. Pt of Jonathan Page. Tolerated well. Please call back.

## 2023-07-05 ENCOUNTER — Telehealth: Payer: Self-pay

## 2023-07-05 ENCOUNTER — Inpatient Hospital Stay: Payer: Medicare HMO

## 2023-07-05 ENCOUNTER — Encounter
Admission: RE | Admit: 2023-07-05 | Discharge: 2023-07-05 | Disposition: A | Discharge status: Home / Self Care | Payer: Medicare HMO | Source: Ambulatory Visit | Attending: Acute Care | Admitting: Acute Care

## 2023-07-05 VITALS — BP 104/67 | HR 105 | Temp 98.0°F | Resp 17

## 2023-07-05 DIAGNOSIS — K5900 Constipation, unspecified: Secondary | ICD-10-CM | POA: Diagnosis not present

## 2023-07-05 DIAGNOSIS — C7951 Secondary malignant neoplasm of bone: Secondary | ICD-10-CM | POA: Diagnosis not present

## 2023-07-05 DIAGNOSIS — J432 Centrilobular emphysema: Secondary | ICD-10-CM | POA: Diagnosis not present

## 2023-07-05 DIAGNOSIS — M899 Disorder of bone, unspecified: Secondary | ICD-10-CM | POA: Diagnosis not present

## 2023-07-05 DIAGNOSIS — R042 Hemoptysis: Secondary | ICD-10-CM | POA: Diagnosis not present

## 2023-07-05 DIAGNOSIS — Z510 Encounter for antineoplastic radiation therapy: Secondary | ICD-10-CM | POA: Diagnosis not present

## 2023-07-05 DIAGNOSIS — G893 Neoplasm related pain (acute) (chronic): Secondary | ICD-10-CM | POA: Diagnosis not present

## 2023-07-05 DIAGNOSIS — R911 Solitary pulmonary nodule: Secondary | ICD-10-CM | POA: Insufficient documentation

## 2023-07-05 DIAGNOSIS — C3412 Malignant neoplasm of upper lobe, left bronchus or lung: Secondary | ICD-10-CM | POA: Diagnosis not present

## 2023-07-05 DIAGNOSIS — D171 Benign lipomatous neoplasm of skin and subcutaneous tissue of trunk: Secondary | ICD-10-CM | POA: Diagnosis not present

## 2023-07-05 MED ORDER — PEGFILGRASTIM-CBQV 6 MG/0.6ML ~~LOC~~ SOSY
6.0000 mg | PREFILLED_SYRINGE | Freq: Once | SUBCUTANEOUS | Status: AC
  Administered 2023-07-05: 6 mg via SUBCUTANEOUS
  Filled 2023-07-05: qty 0.6

## 2023-07-05 NOTE — Telephone Encounter (Signed)
Patient returned call, confirmed new date/time for mychart visit.

## 2023-07-05 NOTE — Patient Instructions (Signed)

## 2023-07-05 NOTE — Telephone Encounter (Signed)
LVM. Advised virtual appt on 07/11/2023 has been rescheduled for 07/19/2023 at 1:30 with Maralyn Sago due to PET being rescheduled to 07/10/2023. Request patient return call and confirm message received.

## 2023-07-05 NOTE — Progress Notes (Signed)
[facility] OFFICE PROGRESS NOTE  Jonathan Nest, NP [address] [place] [address]  DIAGNOSIS: tage IV (T3, N2, M1 C) non-small cell lung cancer, adenocarcinoma presented with large left upper lobe lung mass with left hilar and mediastinal invasion in addition to thoracic inlet lymphadenopathy as well as several metastatic bone lesions involving the spine, ribs as well as the right iliac bone diagnosed in December 2024.   Molecular studies by [id number] showed no actionable mutations and PD-L1 expression was negative.  PRIOR THERAPY: None  CURRENT THERAPY: 1) Palliative systemic chemotherapy and immunotherapy with carboplatin for an AUC of 5, Taxol 175 mg/m, and Libtayo IV every 3 weeks with Neulasta support.  (Not a candidate for Alimta due to CKD).  First dose on 07/03/23.  Status post 1 cycle.  2) radiation to L4 and L5 to the care of Dr. Kathrynn Running. Final treatment expected on 07/21/23  INTERVAL HISTORY: Jonathan [name] 73 y.o. male returns to the clinic today for a follow-up visit. The patient was unfortunately recently diagnosed with stage IV lung cancer.  He underwent his first cycle of systemic chemotherapy last week and he tolerated it well without any appreciable adverse side effects. He did not have any bone pain from the GCSF injections either.   He is pleased today because he is eating better and he has gained weigh.   He is also seeing Dr. Kathrynn Running for palliative radiation to L4/L5.  His last day radiation is expected on 07/21/2023.   He is scheduled for Port-A-Cath placement tomorrow.   He established care with palliative care. He is currently on Percocet and gabapentin for pain management. His pain is controlled at this time.   His staging PET scan was on 07/10/23. His brain MRI is scheduled for 07/08/23. His PET scan showed a large lytic lesion with areas of cortical fracture along the left femoral neck. There is concern for impending pathological fracture.  Radiation oncology reached out to orthopedics earlier today who will reach out to the patient for an appointment and management options.   He denies any major changes in his health since he was seen last week.  He continues to have hoarseness that comes and goes. He denies any fever, chills, or night sweats.  He denies any cough unless clearing his throat.  Denies any hemoptysis or chest pain. He denies any significant shortness of breath.  Denies any nausea, vomiting, or diarrhea.  He has had a little bit of constipation which improves with prune juice.  He denies any rashes or skin changes.  Denies any headache or visual changes.  He is here today for evaluation and a 1 week follow-up visit to manage any adverse side effects of treatment.  MEDICAL HISTORY: Past Medical History:  Diagnosis Date   Allergy    Cancer (HCC)    Cellulitis 02/21/2018   Concussion syndrome    Diabetes (HCC)    Frequent headaches    Hypertension     ALLERGIES:  has no known allergies.  MEDICATIONS:  Current Outpatient Medications  Medication Sig Dispense Refill   aspirin EC 81 MG tablet Take 81 mg by mouth daily.     Blood Glucose Monitoring Suppl (ACCU-CHEK AVIVA PLUS) w/Device KIT Check blood sugar before breakfast, lunch and bedtime and as directed. Dx E11.65 1 kit 0   empagliflozin (JARDIANCE) 10 MG TABS tablet Take 1 tablet (10 mg total) by mouth daily before breakfast. 30 tablet 3   gabapentin (NEURONTIN)  300 MG capsule Take 1 capsule (300 mg total) by mouth at bedtime. 30 capsule 1   glipiZIDE (GLUCOTROL XL) 10 MG 24 hr tablet Take 1 tablet (10 mg total) by mouth daily with breakfast. for diabetes. 90 tablet 3   glucose blood (ACCU-CHEK AVIVA PLUS) test strip Check blood sugar before breakfast, lunch and bedtime and as directed. Dx E11.65 300 each 1   insulin NPH-regular Human (70-30) 100 UNIT/ML injection Inject 5 Units into the skin 2 (two) times daily with a meal.     Lancets (ACCU-CHEK SOFT TOUCH)  lancets Check blood sugar before breakfast, lunch and bedtime and as directed. Dx E11.65 300 each 1   lidocaine-prilocaine (EMLA) cream Apply 1 Application topically as needed. 30 g 1   lisinopril (ZESTRIL) 10 MG tablet Take 1 tablet (10 mg total) by mouth daily. for blood pressure. 90 tablet 0   oxyCODONE-acetaminophen (PERCOCET/ROXICET) 5-325 MG tablet Take 1 tablet by mouth every 8 (eight) hours as needed for severe pain (pain score 7-10). 30 tablet 0   pravastatin (PRAVACHOL) 40 MG tablet Take 1 tablet (40 mg total) by mouth daily. For cholesterol. 90 tablet 3   prochlorperazine (COMPAZINE) 10 MG tablet Take 1 tablet (10 mg total) by mouth every 6 (six) hours as needed for nausea or vomiting. 30 tablet 0   No current facility-administered medications for this visit.    SURGICAL HISTORY:  Past Surgical History:  Procedure Laterality Date   APPENDECTOMY  1960   BRONCHIAL BIOPSY  06/12/2023   Procedure: BRONCHIAL BIOPSIES;  Surgeon: Josephine Igo, DO;  Location: [facility];  Service: Cardiopulmonary;;   BRONCHIAL BRUSHINGS  06/12/2023   Procedure: BRONCHIAL BRUSHINGS;  Surgeon: Josephine Igo, DO;  Location: [facility];  Service: Cardiopulmonary;;   COLONOSCOPY     VIDEO BRONCHOSCOPY Bilateral 06/12/2023   Procedure: VIDEO BRONCHOSCOPY;  Surgeon: Josephine Igo, DO;  Location: [facility];  Service: Cardiopulmonary;  Laterality: Bilateral;   WISDOM TOOTH EXTRACTION      REVIEW OF SYSTEMS:   Constitutional: Positive for stable fatigue. Improving appetite and weight gain. Negative for chills,  fever and unexpected weight change.  HENT: Positive for hoarseness. Negative for mouth sores, nosebleeds, sore throat and trouble swallowing.   Eyes: Negative for eye problems and icterus.  Respiratory: Negative for cough, hemoptysis, and wheezing.   Cardiovascular: Negative for chest pain and leg swelling.  Gastrointestinal: Positive for mild constipation. Negative for abdominal pain,   diarrhea, nausea and vomiting.  Genitourinary: Negative for bladder incontinence, difficulty urinating, dysuria, frequency and hematuria.   Musculoskeletal: Negative for back pain, gait problem, neck pain and neck stiffness.  Skin: Negative for itching and rash.  Neurological: Negative for dizziness, extremity weakness, gait problem, headaches, light-headedness and seizures.  Hematological: Negative for adenopathy. Does not bruise/bleed easily.  Psychiatric/Behavioral: Negative for confusion, depression and sleep disturbance. The patient is not nervous/anxious.     PHYSICAL EXAMINATION:  There were no vitals taken for this visit.  ECOG PERFORMANCE STATUS: 1  Physical Exam  Constitutional: Oriented to person, place, and time and thin appearing male, and in no distress.   HENT:  Head: Normocephalic and atraumatic.  Mouth/Throat: Oropharynx is clear and moist. No oropharyngeal exudate.  Eyes: Conjunctivae are normal. Right eye exhibits no discharge. Left eye exhibits no discharge. No scleral icterus.  Neck: Normal range of motion. Neck supple.  Cardiovascular: Normal rate, regular rhythm, normal heart sounds and intact distal pulses.   Pulmonary/Chest: Effort normal and breath sounds normal.  No respiratory distress. No wheezes. No rales.  Abdominal: Soft. Bowel sounds are normal. Exhibits no distension and no mass. There is no tenderness.  Musculoskeletal: Normal range of motion. Exhibits no edema.  Lymphadenopathy:    No cervical adenopathy.  Neurological: Alert and oriented to person, place, and time. Exhibits muscle wasting. Gait normal. Coordination normal. He ambulates with a cane.  Skin: Skin is warm and dry. No rash noted. Not diaphoretic. No erythema. No pallor.  Psychiatric: Mood, memory and judgment normal.  Vitals reviewed.  LABORATORY DATA: Lab Results  Component Value Date   WBC 42.5 (H) 07/10/2023   HGB 9.7 (L) 07/10/2023   HCT 29.7 (L) 07/10/2023   MCV 90.3  07/10/2023   PLT 239 07/10/2023      Chemistry      Component Value Date/Time   NA 138 07/10/2023 1502   K 4.8 07/10/2023 1502   CL 103 07/10/2023 1502   CO2 27 07/10/2023 1502   BUN 20 07/10/2023 1502   CREATININE 1.34 (H) 07/10/2023 1502      Component Value Date/Time   CALCIUM 11.0 (H) 07/10/2023 1502   ALKPHOS 270 (H) 07/10/2023 1502   AST 16 07/10/2023 1502   ALT 11 07/10/2023 1502   BILITOT 0.5 07/10/2023 1502       RADIOGRAPHIC STUDIES:  MR BRAIN W WO CONTRAST Result Date: 07/11/2023 CLINICAL DATA:  Non-small cell lung cancer, staging EXAM: MRI HEAD WITHOUT AND WITH CONTRAST TECHNIQUE: Multiplanar, multiecho pulse sequences of the brain and surrounding structures were obtained without and with intravenous contrast. CONTRAST:  6mL GADAVIST GADOBUTROL 1 MMOL/ML IV SOLN COMPARISON:  10/17/2014 MRI head FINDINGS: Brain: No restricted diffusion to suggest acute or subacute infarct. No abnormal parenchymal or meningeal enhancement. No acute hemorrhage, mass, mass effect, or midline shift. No hydrocephalus or extra-axial collection. Partial empty sella. Craniocervical junction within normal limits. No hemosiderin deposition to suggest remote hemorrhage. Normal cerebral volume for age. Scattered and confluent T2 hyperintense signal in the periventricular white matter, likely the sequela of mild-to-moderate chronic small vessel ischemic disease. Mildly advanced cerebral volume loss for age, without disproportionate lobar atrophy. Vascular: Normal arterial flow voids. Normal arterial and venous enhancement. Skull and upper cervical spine: Normal marrow signal. Sinuses/Orbits: Clear paranasal sinuses. No acute finding in the orbits. Other: The mastoid air cells are well aerated. IMPRESSION: No acute intracranial process. No evidence of intracranial metastatic disease. Electronically Signed   By: Wiliam Ke M.D.   On: 07/11/2023 21:16   NM PET Image Initial (PI) Skull Base To Thigh Result  Date: 07/11/2023 CLINICAL DATA:  Initial treatment strategy for lung mass. EXAM: NUCLEAR MEDICINE PET SKULL BASE TO THIGH TECHNIQUE: 8.45 mCi F-18 FDG was injected intravenously. Full-ring PET imaging was performed from the skull base to thigh after the radiotracer. CT data was obtained and used for attenuation correction and anatomic localization. Fasting blood glucose: 152 mg/dl COMPARISON:  CT chest, abdomen and pelvis from 05/29/2023 FINDINGS: Mediastinal blood pool activity: SUV max 2.24 Liver activity: SUV max NA NECK: No hypermetabolic lymph nodes in the neck. Incidental CT findings: Asymmetric increased tracer uptake identified within the right vocal cord. This may reflect sequelae of the AP window mass noted in the chest with resultant mass effect upon the recurrent laryngeal nerve. Clinical correlation advised. CHEST: Within the paramediastinal left upper lobe there is a solid FDG avid mass which measures 2.7 cm and has an SUV max of 3.45, image 50/6. On the previous CT this measured 2.9 cm.  There is adjacent ill-defined soft tissue infiltration within the left pre-vascular, AP window, and left paratracheal region with SUV max of 2.39. Progressive peribronchovascular infiltrative soft tissue and nodularity is identified within the left upper lobe, image 56/6. This is concerning for peribronchovascular spread of tumor with SUV max of 2.42, image 56 of series 6. Soft tissue thickening and nodularity along the oblique fissure of the left lung is also noted concerning for pleural involvement by tumor, image 56/6. This is new from previous exam. Small left pleural effusion is identified which appears partially loculated over the posterior left upper lobe, also new from prior exam. Cannot exclude malignant pleural effusion. Signs of nodal metastasis within the chest noted including: Nodularity within the prevascular anterior mediastinum, anterior to the SVC measures 1.6 cm with SUV max of 2.0, image 48/6.  Previously this measured 0.6 cm. -Enlarged right internal mammary lymph node measures 1.2 cm with SUV max of 1.85, image 52/6. New from previous exam. -Tracer avid left supraclavicular lymph nodes are identified. Index node measures 0.7 cm with SUV max of 2.32. Previously this measured the same. Multiple scattered lung nodules are identified throughout the remaining portions of the lungs concerning for metastatic disease. Index lesions include: -Index nodule within the subpleural right upper lobe measures 8 mm with SUV max of 1.12, image 45/6. Previously this measured 5 mm and appeared less solid. -Index nodule within the lingula measures 1.2 cm with SUV max 1.80. Previously this measured 0.3 cm. Incidental CT findings: Emphysema. Mild aortic atherosclerotic calcifications. ABDOMEN/PELVIS: No abnormal tracer avid lesions above background liver activity identified. No abnormal tracer uptake identified within the pancreas or adrenal glands. No tracer avid abdominopelvic lymph nodes. Incidental CT findings: Aortic atherosclerosis. Innumerable bilateral renal cysts are identified. SKELETON: Widespread tracer avid lytic bone metastases are identified involving the axial and proximal appendicular skeleton. Index lesions include: -Large lytic lesion with areas of cortical fracture involving the left femoral neck with SUV max of 4.17, image 142/6. Although not completely fractured at this time I am concerned for an impending pathologic fracture. -More distally within the proximal left femur is a permeative tracer avid lesion with surrounding soft tissue component with SUV max of 4.23, image 158/6. -Tumor involving the L4-5 level is identified with soft tissue extension into the surrounding paraspinal soft tissues. -Underlying permeative appearance of the L4 and L5 vertebra noted. Cannot exclude posterior extension of tumor into the lower lumbar canal at this level. -Permeative lesion involving the posterior aspect of the  left third rib has an SUV max of 3.64. Incidental CT findings: None. IMPRESSION: 1. There is a 2.7 cm solid FDG avid mass within the paramediastinal left upper lobe compatible with primary bronchogenic carcinoma. There is adjacent ill-defined soft tissue infiltration within the left pre-vascular, AP window, and left paratracheal region compatible with nodal metastasis. 2. Progressive peribronchovascular infiltrative soft tissue and nodularity is identified within the left upper lobe concerning for peribronchovascular spread of tumor. 3. Soft tissue thickening and nodularity along the oblique fissure of the left lung is also noted concerning for pleural involvement by tumor. New from previous exam. 4. New left pleural effusion is identified which appears partially loculated over the posterior left upper lobe. Cannot exclude malignant pleural effusion. 5. Multiple scattered lung nodules are identified throughout the remaining portions of the lungs concerning for metastatic disease. Index nodules are increased in size. 6. Widespread tracer avid lytic bone metastases are identified involving the axial and proximal appendicular skeleton. 7. Large lytic lesion  with areas of cortical fracture involving the left femoral neck. Although not completely fractured at this time I am concerned for an impending pathologic fracture. 8. Asymmetric increased tracer uptake identified within the right vocal cord. This may reflect sequelae of the AP window mass noted in the chest with resultant mass effect upon the recurrent laryngeal nerve. 9. Aortic Atherosclerosis (ICD10-I70.0) and Emphysema (ICD10-J43.9). Electronically Signed   By: Signa Kell M.D.   On: 07/11/2023 17:37     ASSESSMENT/PLAN:  This is a very pleasant 73 year old African-American male diagnosed with stage IV (T3, N2, M1 C) non-small cell lung cancer, adenocarcinoma.  He presented with a left upper lobe lung mass with left hilar mediastinal invasion in addition  to thoracic inlet lymphadenopathy as well as several metastatic bone lesions involving the spine, ribs, and right iliac bone.  The patient was diagnosed in December 2024.  His staging PET scan is scheduled for later this week on 07/05/2023.  His brain MRI is pending.   His molecular studies by [id number] showed no actionable mutations and his PD-L1 expression is negative.   The patient is expected to undergo palliative radiation to L4/L5 to the care of Dr. Kathrynn Running and this is expected to conclude on 07/21/2023.   Because of the patient's CKD, the patient is undergoing palliative systemic chemotherapy and immunotherapy with carboplatin for an AUC of 5, paclitaxel 175 mg/m, and Libtayo with Neulasta support. His first dose was on 07/03/2023.    Labs were reviewed.   The patient was seen with Dr. Arbutus Ped today.  Dr. Arbutus Ped reviewed the PET scan.  The PET scan showed widespread disease.  We will arrange for repeat imaging studies to assess response to treatment after 3 cycles of treatment.  Regarding the impending left hip fracture, the patient was advised to avoid heavy lifting or significant weightbearing.  Hopefully hear from orthopedics soon about scheduling an appointment regarding management of this.  Should the patient develop any signs and symptoms of hip fracture such as increased pain, difficulties with ambulation, etc. he will seek emergency evaluation.    We will see him back for follow-up visit in 2 weeks for evaluation and repeat blood work before undergoing cycle #2.   We will continue to monitor his labs weekly while he is undergoing chemotherapy.   He is expected to have a Port-A-Cath placed next week.  He is also expected to establish care with palliative care on 07/12/2023.  The patient's white blood cell count is elevated.  This may be secondary to Neulasta but I did review with the patient should he ever develop any signs and symptoms of infection he would need to be evaluated.   He denies any signs and symptoms of infection at this time.   The patient was advised to call immediately if he has any concerning symptoms in the interval. The patient voices understanding of current disease status and treatment options and is in agreement with the current care plan. All questions were answered. The patient knows to call the clinic with any problems, questions or concerns. We can certainly see the patient much sooner if necessary   No orders of the defined types were placed in this encounter.    Nashawn Hillock L Camiya Vinal, PA-C 07/12/23  ADDENDUM: Hematology/Oncology Attending: I had a face-to-face encounter with the patient today.  I reviewed his record, lab, scan and recommended his care plan.  This is a pleasant 73 years old African-American male recently diagnosed with non-small cell lung cancer, adenocarcinoma  in December 2024.  Molecular studies showed no actionable mutations and negative PD-L1 expression.  The patient has chronic kidney disease and was not a candidate for treatment with pemetrexed.  He started palliative systemic chemoimmunotherapy with carboplatin, paclitaxel and Libtayo (Cempilimab) every 3 weeks with Neulasta support.  He received the first cycle of his treatment on July 03, 2023 and tolerated the first 8 days of his treatment fairly well.  He is also currently undergoing palliative radiotherapy to the L4 and L5 vertebral metastasis.  He had MRI of the brain that showed no concerning findings for metastatic disease to the brain.  He also had a PET scan that showed the 2.7 cm solid FDG avid mass within the paramediastinal left upper lobe in addition to ill-defined soft tissue infiltration within the left prevascular, AP window and left paratracheal region compatible with nodal metastasis.  He also has significant osseous metastasis with lytic lesions involving the axial and proximal appendicular skeleton with large lytic lesion within areas of cortical  fracture involving the left femoral neck concerning for an impending pathologic fracture.  I discussed the PET scan result with the patient and his wife. He was referred by radiation oncology to orthopedic surgery for evaluation of the left femoral neck lesion.  He is currently undergoing palliative radiotherapy to multiple bone lesions. He is also followed by the palliative care team for pain management. I recommended for the patient to continue his current treatment with systemic chemotherapy as planned and he is expected to start cycle #2 in around 2 weeks. The patient was advised to call immediately if he has any other concerning symptoms in the interval. The total time spent in the appointment was 30 minutes. Disclaimer: This note was dictated with voice recognition software. Similar sounding words can inadvertently be transcribed and may be missed upon review. Lajuana Matte, MD

## 2023-07-06 NOTE — Progress Notes (Signed)
Palliative Medicine [facility]  [phone] [phone]   Name: Jonathan Page Date: 07/06/2023 MRN: [id number]  DOB: July 06, 1950  Patient Care Team: Doreene Nest, NP as PCP - General (Internal Medicine) Lytle Butte, RN as Oncology Nurse Navigator Pa, [facility] (Optometry)    REASON FOR CONSULTATION: Jonathan Page is a 73 y.o. male with oncologic medical history including non-small cell lung cancer (06/2023) with metastatic disease to spine and widespread bone. As well as a history of diabetes, hypertension, and seasonal allergies and a 45lb weight loss in the last 3-4 months. Palliative ask to see for symptom management and goals of care.    SOCIAL HISTORY:     reports that he quit smoking about 4 years ago. His smoking use included cigarettes and cigars. He started smoking about 34 years ago. He has a 15 pack-year smoking history. He has never used smokeless tobacco. He reports current alcohol use of about 6.0 standard drinks of alcohol per week. He reports that he does not use drugs.  ADVANCE DIRECTIVES:    CODE STATUS: Full code  PAST MEDICAL HISTORY: Past Medical History:  Diagnosis Date   Allergy    Cancer (HCC)    Cellulitis 02/21/2018   Concussion syndrome    Diabetes (HCC)    Frequent headaches    Hypertension     PAST SURGICAL HISTORY:  Past Surgical History:  Procedure Laterality Date   APPENDECTOMY  1960   BRONCHIAL BIOPSY  06/12/2023   Procedure: BRONCHIAL BIOPSIES;  Surgeon: Josephine Igo, DO;  [facility];  Service: Cardiopulmonary;;   BRONCHIAL BRUSHINGS  06/12/2023   Procedure: BRONCHIAL BRUSHINGS;  Surgeon: Josephine Igo, DO;  Location: [facility];  Service: Cardiopulmonary;;   COLONOSCOPY     VIDEO BRONCHOSCOPY Bilateral 06/12/2023   Procedure: VIDEO BRONCHOSCOPY;  Surgeon: Josephine Igo, DO;  Location: [facility];  Service: Cardiopulmonary;  Laterality: Bilateral;    WISDOM TOOTH EXTRACTION      HEMATOLOGY/ONCOLOGY HISTORY:  Oncology History  Non-small cell lung cancer metastatic to bone (HCC)  06/22/2023 Initial Diagnosis   Non-small cell lung cancer metastatic to bone (HCC)   07/03/2023 - 07/03/2023 Chemotherapy   Patient is on Treatment Plan : LUNG Carboplatin (5) + Pemetrexed (500) + Pembrolizumab (200) D1 q21d Induction x 4 cycles / Maintenance Pemetrexed (500) + Pembrolizumab (200) D1 q21d     07/03/2023 -  Chemotherapy   Patient is on Treatment Plan : LUNG Carboplatin + Paclitaxel q21d Dose Reduction       ALLERGIES:  has no known allergies.  MEDICATIONS:  Current Outpatient Medications  Medication Sig Dispense Refill   aspirin EC 81 MG tablet Take 81 mg by mouth daily.     Blood Glucose Monitoring Suppl (ACCU-CHEK AVIVA PLUS) w/Device KIT Check blood sugar before breakfast, lunch and bedtime and as directed. Dx E11.65 1 kit 0   empagliflozin (JARDIANCE) 10 MG TABS tablet Take 1 tablet (10 mg total) by mouth daily before breakfast. 30 tablet 3   gabapentin (NEURONTIN) 300 MG capsule Take 1 capsule (300 mg total) by mouth at bedtime. 30 capsule 1   glipiZIDE (GLUCOTROL XL) 10 MG 24 hr tablet Take 1 tablet (10 mg total) by mouth daily with breakfast. for diabetes. 90 tablet 3   glucose blood (ACCU-CHEK AVIVA PLUS) test strip Check blood sugar before breakfast, lunch and bedtime and as directed. Dx E11.65 300 each 1   insulin NPH-regular Human (70-30)  100 UNIT/ML injection Inject 5 Units into the skin 2 (two) times daily with a meal.     Lancets (ACCU-CHEK SOFT TOUCH) lancets Check blood sugar before breakfast, lunch and bedtime and as directed. Dx E11.65 300 each 1   lidocaine-prilocaine (EMLA) cream Apply 1 Application topically as needed. 30 g 1   lisinopril (ZESTRIL) 10 MG tablet Take 1 tablet (10 mg total) by mouth daily. 90 tablet 1   oxyCODONE-acetaminophen (PERCOCET/ROXICET) 5-325 MG tablet Take 1 tablet by mouth every 8 (eight) hours as  needed for severe pain (pain score 7-10). 30 tablet 0   pravastatin (PRAVACHOL) 40 MG tablet Take 1 tablet (40 mg total) by mouth daily. For cholesterol. 90 tablet 3   prochlorperazine (COMPAZINE) 10 MG tablet Take 1 tablet (10 mg total) by mouth every 6 (six) hours as needed for nausea or vomiting. 30 tablet 0   No current facility-administered medications for this visit.    VITAL SIGNS: There were no vitals taken for this visit. There were no vitals filed for this visit.  Estimated body mass index is 19.54 kg/m as calculated from the following:   Height as of 06/22/23: 6\' 1"  (1.854 m).   Weight as of 07/03/23: 148 lb 1.6 oz (67.2 kg).  LABS: CBC:    Component Value Date/Time   WBC 8.8 07/03/2023 0739   WBC 8.3 04/20/2023 1522   HGB 12.2 (L) 07/03/2023 0739   HCT 37.3 (L) 07/03/2023 0739   PLT 251 07/03/2023 0739   MCV 89.4 07/03/2023 0739   NEUTROABS 6.1 07/03/2023 0739   LYMPHSABS 1.9 07/03/2023 0739   MONOABS 0.6 07/03/2023 0739   EOSABS 0.1 07/03/2023 0739   BASOSABS 0.1 07/03/2023 0739   Comprehensive Metabolic Panel:    Component Value Date/Time   NA 138 07/03/2023 0739   K 3.9 07/03/2023 0739   CL 102 07/03/2023 0739   CO2 27 07/03/2023 0739   BUN 25 (H) 07/03/2023 0739   CREATININE 1.87 (H) 07/03/2023 0739   GLUCOSE 216 (H) 07/03/2023 0739   CALCIUM 11.3 (H) 07/03/2023 0739   AST 14 (L) 07/03/2023 0739   ALT 8 07/03/2023 0739   ALKPHOS 177 (H) 07/03/2023 0739   BILITOT 0.5 07/03/2023 0739   PROT 7.1 07/03/2023 0739   ALBUMIN 4.2 07/03/2023 0739    RADIOGRAPHIC STUDIES: No results found.  PERFORMANCE STATUS (ECOG) : 1 - Symptomatic but completely ambulatory  Review of Systems Unless otherwise noted, a complete review of systems is negative.  Physical Exam General: NAD Cardiovascular: regular rate and rhythm Pulmonary: clear ant fields Abdomen: soft, nontender, + bowel sounds Extremities: no edema, no joint deformities Skin: no rashes Neurological:  Alert and oriented x3  IMPRESSION:  This is my initial visit with Jonathan Page. No acute distress. No family present. Patient is alert and able to engage in discussions appropriately.  I introduced myself, Maygan RN, and Palliative's role in collaboration with the oncology team. Concept of Palliative Care was introduced as specialized medical care for people and their families living with serious illness.  It focuses on providing relief from the symptoms and stress of a serious illness.  The goal is to improve quality of life for both the patient and the family. Values and goals of care important to patient and family were attempted to be elicited.   Jonathan Page has his own home however his bedroom is upstairs. Due to the need to be on one level given recent health changes he is currently staying with  his girlfriend. He has two sons, one in [place] working in IT and the other in [place] in [employer]. He has one grandchild. 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  He is able to perform most ADLs independently with some limitations due to pain and fatigue. Lemario underwent his initial chemotherapy treatment on last week and tolerated well. Is tolerating radiation. His weight is up to 154lbs from 148lbs on 1/20. He is much appreciative of this. He has been experiencing intermittent hoarseness for about a week, which he manages by limiting verbal communication and using text messaging instead. The hoarseness sometimes returns unexpectedly.  He is currently undergoing radiation therapy and experiences discomfort from the treatment tables, which exacerbates his pain. He manages his pain with a regimen of Oxycontin, Tylenol, and gabapentin, taken before sleep and in the morning, which helps him achieve somewhat of a restful night. Pain is overall controlled. Some days are better than others. His pain is in his lower back and legs. Given pain is controlled we will  continue on current regimen with close follow-up.   Jonathan Page reports some recent challenges with constipation, however, has been able to manage effectively with prune juice most recently. He humorously recounts an incident where his girlfriend suggested a larger quantity, resulting in a prolonged bathroom visit. He understands to continue use of prune juice. If he begins to realize this is no longer effective will need to consider daily use of stool softener.  We discussed his current illness and what it means in the larger context of his on-going co-morbidities. Natural disease trajectory and expectations were discussed. Jonathan Page is clear in his expressed wishes to continue to treat the treatable allowing him every opportunity to thrive while managing symptoms.   I discussed the importance of continued conversation with family and their medical providers regarding overall plan of care and treatment options, ensuring decisions are within the context of the patients values and GOCs.  PLAN: Established therapeutic relationship. Education provided on palliative's role in collaboration with their Oncology/Radiation team.  Cancer Related Pain Management Patient reports taking Oxycodone and Gabapentin for pain management. No new complaints of pain. Pain controlled overall. Some days better than others.  -Continue current regimen of Oxycodone 5/325mg  every 8 hours  -Continue Gabapentin 300mg  at bedtime, can increase to twice daily if pain worsens.  -Contact office for refills as needed.  Constipation Patient reports constipation, managed with prune juice. -Continue current home remedy for constipation. -If prune juice is no longer effective will need to consider use of daily stool softeners.   Radiation Therapy Patient is currently undergoing radiation therapy. No new complaints related to therapy. -Continue current radiation therapy regimen.  General Health Maintenance -Continue to monitor  symptoms and report any changes. -Follow-up appointments as scheduled. -I will plan to see patient back in 2-3 weeks for follow-up.  Patient expressed understanding and was in agreement with this plan. He also understands that He can call the clinic at any time with any questions, concerns, or complaints.   Thank you for your referral and allowing Palliative to assist in Jonathan Page care.   Number and complexity of problems addressed: HIGH - 1 or more chronic illnesses with SEVERE exacerbation, progression, or side effects of treatment - advanced cancer, pain. Any controlled substances utilized were prescribed in the context of palliative care.   Visit consisted of counseling and education dealing with the complex and  emotionally intense issues of symptom management and palliative care in the setting of serious and potentially life-threatening illness.  Signed by: Willette Alma, AGPCNP-BC Palliative Medicine Team/[facility]

## 2023-07-07 ENCOUNTER — Telehealth: Payer: Medicare HMO | Admitting: Acute Care

## 2023-07-08 ENCOUNTER — Ambulatory Visit (HOSPITAL_COMMUNITY)
Admission: RE | Admit: 2023-07-08 | Discharge: 2023-07-08 | Disposition: A | Discharge status: Home / Self Care | Payer: Medicare HMO | Source: Ambulatory Visit | Attending: Internal Medicine | Admitting: Internal Medicine

## 2023-07-08 DIAGNOSIS — C3490 Malignant neoplasm of unspecified part of unspecified bronchus or lung: Secondary | ICD-10-CM | POA: Insufficient documentation

## 2023-07-08 DIAGNOSIS — R9089 Other abnormal findings on diagnostic imaging of central nervous system: Secondary | ICD-10-CM | POA: Diagnosis not present

## 2023-07-08 MED ORDER — GADOBUTROL 1 MMOL/ML IV SOLN
6.0000 mL | Freq: Once | INTRAVENOUS | Status: AC | PRN
  Administered 2023-07-08: 6 mL via INTRAVENOUS

## 2023-07-09 ENCOUNTER — Other Ambulatory Visit: Payer: Self-pay | Admitting: Internal Medicine

## 2023-07-10 ENCOUNTER — Inpatient Hospital Stay: Payer: Medicare HMO

## 2023-07-10 ENCOUNTER — Other Ambulatory Visit: Payer: Self-pay

## 2023-07-10 ENCOUNTER — Encounter: Payer: Self-pay | Admitting: Internal Medicine

## 2023-07-10 ENCOUNTER — Ambulatory Visit
Admission: RE | Admit: 2023-07-10 | Discharge: 2023-07-10 | Disposition: A | Discharge status: Home / Self Care | Payer: Medicare HMO | Source: Ambulatory Visit | Attending: Radiation Oncology | Admitting: Radiation Oncology

## 2023-07-10 ENCOUNTER — Ambulatory Visit
Admission: RE | Admit: 2023-07-10 | Discharge: 2023-07-10 | Disposition: A | Discharge status: Home / Self Care | Payer: Medicare HMO | Source: Ambulatory Visit | Attending: Acute Care | Admitting: Acute Care

## 2023-07-10 DIAGNOSIS — C7951 Secondary malignant neoplasm of bone: Secondary | ICD-10-CM

## 2023-07-10 DIAGNOSIS — J90 Pleural effusion, not elsewhere classified: Secondary | ICD-10-CM | POA: Diagnosis not present

## 2023-07-10 DIAGNOSIS — D171 Benign lipomatous neoplasm of skin and subcutaneous tissue of trunk: Secondary | ICD-10-CM | POA: Diagnosis not present

## 2023-07-10 DIAGNOSIS — R918 Other nonspecific abnormal finding of lung field: Secondary | ICD-10-CM | POA: Insufficient documentation

## 2023-07-10 DIAGNOSIS — K5900 Constipation, unspecified: Secondary | ICD-10-CM | POA: Diagnosis not present

## 2023-07-10 DIAGNOSIS — Z87891 Personal history of nicotine dependence: Secondary | ICD-10-CM | POA: Diagnosis not present

## 2023-07-10 DIAGNOSIS — J432 Centrilobular emphysema: Secondary | ICD-10-CM | POA: Diagnosis not present

## 2023-07-10 DIAGNOSIS — Z510 Encounter for antineoplastic radiation therapy: Secondary | ICD-10-CM | POA: Diagnosis not present

## 2023-07-10 DIAGNOSIS — C3412 Malignant neoplasm of upper lobe, left bronchus or lung: Secondary | ICD-10-CM | POA: Diagnosis not present

## 2023-07-10 DIAGNOSIS — R042 Hemoptysis: Secondary | ICD-10-CM | POA: Diagnosis not present

## 2023-07-10 DIAGNOSIS — M899 Disorder of bone, unspecified: Secondary | ICD-10-CM | POA: Diagnosis not present

## 2023-07-10 DIAGNOSIS — G893 Neoplasm related pain (acute) (chronic): Secondary | ICD-10-CM | POA: Diagnosis not present

## 2023-07-10 LAB — CBC WITH DIFFERENTIAL (CANCER CENTER ONLY)
Abs Immature Granulocytes: 3.04 10*3/uL — ABNORMAL HIGH (ref 0.00–0.07)
Basophils Absolute: 0 10*3/uL (ref 0.0–0.1)
Basophils Relative: 0 %
Eosinophils Absolute: 0.3 10*3/uL (ref 0.0–0.5)
Eosinophils Relative: 1 %
HCT: 29.7 % — ABNORMAL LOW (ref 39.0–52.0)
Hemoglobin: 9.7 g/dL — ABNORMAL LOW (ref 13.0–17.0)
Immature Granulocytes: 7 %
Lymphocytes Relative: 9 %
Lymphs Abs: 3.8 10*3/uL (ref 0.7–4.0)
MCH: 29.5 pg (ref 26.0–34.0)
MCHC: 32.7 g/dL (ref 30.0–36.0)
MCV: 90.3 fL (ref 80.0–100.0)
Monocytes Absolute: 3.7 10*3/uL — ABNORMAL HIGH (ref 0.1–1.0)
Monocytes Relative: 9 %
Neutro Abs: 31.8 10*3/uL — ABNORMAL HIGH (ref 1.7–7.7)
Neutrophils Relative %: 74 %
Platelet Count: 239 10*3/uL (ref 150–400)
RBC: 3.29 MIL/uL — ABNORMAL LOW (ref 4.22–5.81)
RDW: 15.5 % (ref 11.5–15.5)
Smear Review: NORMAL
WBC Count: 42.5 10*3/uL — ABNORMAL HIGH (ref 4.0–10.5)
nRBC: 0.4 % — ABNORMAL HIGH (ref 0.0–0.2)

## 2023-07-10 LAB — CMP (CANCER CENTER ONLY)
ALT: 11 U/L (ref 0–44)
AST: 16 U/L (ref 15–41)
Albumin: 3.9 g/dL (ref 3.5–5.0)
Alkaline Phosphatase: 270 U/L — ABNORMAL HIGH (ref 38–126)
Anion gap: 8 (ref 5–15)
BUN: 20 mg/dL (ref 8–23)
CO2: 27 mmol/L (ref 22–32)
Calcium: 11 mg/dL — ABNORMAL HIGH (ref 8.9–10.3)
Chloride: 103 mmol/L (ref 98–111)
Creatinine: 1.34 mg/dL — ABNORMAL HIGH (ref 0.61–1.24)
GFR, Estimated: 56 mL/min — ABNORMAL LOW (ref >60)
Glucose, Bld: 165 mg/dL — ABNORMAL HIGH (ref 70–99)
Potassium: 4.8 mmol/L (ref 3.5–5.1)
Sodium: 138 mmol/L (ref 135–145)
Total Bilirubin: 0.5 mg/dL (ref 0.0–1.2)
Total Protein: 6.7 g/dL (ref 6.5–8.1)

## 2023-07-10 LAB — RAD ONC ARIA SESSION SUMMARY
Course Elapsed Days: 0
Plan Fractions Treated to Date: 1
Plan Prescribed Dose Per Fraction: 3 Gy
Plan Total Fractions Prescribed: 10
Plan Total Prescribed Dose: 30 Gy
Reference Point Dosage Given to Date: 3 Gy
Reference Point Session Dosage Given: 3 Gy
Session Number: 1

## 2023-07-10 LAB — GLUCOSE, CAPILLARY: Glucose-Capillary: 152 mg/dL — ABNORMAL HIGH (ref 70–99)

## 2023-07-10 MED ORDER — OXYCODONE-ACETAMINOPHEN 5-325 MG PO TABS: 1.0000 | ORAL_TABLET | Freq: Three times a day (TID) | ORAL | 0 refills | Status: DC | PRN

## 2023-07-10 MED ORDER — FLUDEOXYGLUCOSE F - 18 (FDG) INJECTION
8.4500 | Freq: Once | INTRAVENOUS | Status: AC | PRN
  Administered 2023-07-10: 8.45 via INTRAVENOUS

## 2023-07-11 ENCOUNTER — Ambulatory Visit
Admission: RE | Admit: 2023-07-11 | Discharge: 2023-07-11 | Disposition: A | Discharge status: Home / Self Care | Payer: Medicare HMO | Source: Ambulatory Visit | Attending: Radiation Oncology

## 2023-07-11 ENCOUNTER — Other Ambulatory Visit: Payer: Self-pay | Admitting: Radiology

## 2023-07-11 ENCOUNTER — Telehealth: Payer: Self-pay

## 2023-07-11 ENCOUNTER — Other Ambulatory Visit: Payer: Self-pay | Admitting: Primary Care

## 2023-07-11 ENCOUNTER — Other Ambulatory Visit: Payer: Self-pay

## 2023-07-11 ENCOUNTER — Telehealth: Payer: Medicare HMO | Admitting: Acute Care

## 2023-07-11 DIAGNOSIS — Z510 Encounter for antineoplastic radiation therapy: Secondary | ICD-10-CM | POA: Diagnosis not present

## 2023-07-11 DIAGNOSIS — M899 Disorder of bone, unspecified: Secondary | ICD-10-CM | POA: Diagnosis not present

## 2023-07-11 DIAGNOSIS — R042 Hemoptysis: Secondary | ICD-10-CM | POA: Diagnosis not present

## 2023-07-11 DIAGNOSIS — C3412 Malignant neoplasm of upper lobe, left bronchus or lung: Secondary | ICD-10-CM | POA: Diagnosis not present

## 2023-07-11 DIAGNOSIS — C7951 Secondary malignant neoplasm of bone: Secondary | ICD-10-CM | POA: Diagnosis not present

## 2023-07-11 DIAGNOSIS — J432 Centrilobular emphysema: Secondary | ICD-10-CM | POA: Diagnosis not present

## 2023-07-11 DIAGNOSIS — I10 Essential (primary) hypertension: Secondary | ICD-10-CM

## 2023-07-11 DIAGNOSIS — K5900 Constipation, unspecified: Secondary | ICD-10-CM | POA: Diagnosis not present

## 2023-07-11 DIAGNOSIS — D171 Benign lipomatous neoplasm of skin and subcutaneous tissue of trunk: Secondary | ICD-10-CM | POA: Diagnosis not present

## 2023-07-11 DIAGNOSIS — G893 Neoplasm related pain (acute) (chronic): Secondary | ICD-10-CM | POA: Diagnosis not present

## 2023-07-11 DIAGNOSIS — Z87891 Personal history of nicotine dependence: Secondary | ICD-10-CM | POA: Diagnosis not present

## 2023-07-11 LAB — RAD ONC ARIA SESSION SUMMARY
Course Elapsed Days: 1
Plan Fractions Treated to Date: 2
Plan Prescribed Dose Per Fraction: 3 Gy
Plan Total Fractions Prescribed: 10
Plan Total Prescribed Dose: 30 Gy
Reference Point Dosage Given to Date: 6 Gy
Reference Point Session Dosage Given: 3 Gy
Session Number: 2

## 2023-07-11 MED ORDER — LISINOPRIL 10 MG PO TABS: 10.0000 mg | ORAL_TABLET | Freq: Every day | ORAL | 0 refills | Status: DC

## 2023-07-11 NOTE — Telephone Encounter (Signed)
Patient called and reviewed signs of infections.  Patient verbalized understanding.

## 2023-07-11 NOTE — H&P (Signed)
Chief Complaint: Left lung adenocarcinoma; referred for port a cath placement to assist with treatment  Referring Provider(s): Mohamed,M  Supervising Physician: Roanna Banning  Patient Status: [facility] - Out-pt  History of Present Illness: Jonathan Page is a 73 y.o. male ex smoker with PMH sig for DM, HTN, CKD, and stage IV (T3, N2, M1 C) non-small cell lung cancer, adenocarcinoma who presented with large left upper lobe lung mass with left hilar and mediastinal invasion in addition to thoracic inlet lymphadenopathy as well as several metastatic bone lesions involving the spine, ribs as well as the right iliac bone diagnosed in December 2024. He is scheduled today for port a cath placement to assist with treatment.   *** Patient is Full Code  Past Medical History:  Diagnosis Date   Allergy    Cancer (HCC)    Cellulitis 02/21/2018   Concussion syndrome    Diabetes (HCC)    Frequent headaches    Hypertension     Past Surgical History:  Procedure Laterality Date   APPENDECTOMY  1960   BRONCHIAL BIOPSY  06/12/2023   Procedure: BRONCHIAL BIOPSIES;  Surgeon: Josephine Igo, DO;  [facility];  Service: Cardiopulmonary;;   BRONCHIAL BRUSHINGS  06/12/2023   Procedure: BRONCHIAL BRUSHINGS;  Surgeon: Josephine Igo, DO;  [facility]: [facility];  Service: Cardiopulmonary;;   COLONOSCOPY     VIDEO BRONCHOSCOPY Bilateral 06/12/2023   Procedure: VIDEO BRONCHOSCOPY;  Surgeon: Josephine Igo, DO;  Location: [facility];  Service: Cardiopulmonary;  Laterality: Bilateral;   WISDOM TOOTH EXTRACTION      Allergies: Patient has no known allergies.  Medications: Prior to Admission medications   Medication Sig Start Date End Date Taking? Authorizing Provider  aspirin EC 81 MG tablet Take 81 mg by mouth daily.    [provider]  Blood Glucose Monitoring Suppl (ACCU-CHEK AVIVA PLUS) w/Device KIT Check blood sugar before breakfast, lunch and bedtime and as directed. Dx  E11.65 07/13/17   Doreene Nest, NP  empagliflozin (JARDIANCE) 10 MG TABS tablet Take 1 tablet (10 mg total) by mouth daily before breakfast. 05/29/23   Karie Schwalbe, MD  gabapentin (NEURONTIN) 300 MG capsule Take 1 capsule (300 mg total) by mouth at bedtime. 07/03/23   Heilingoetter, Cassandra L, PA-C  glipiZIDE (GLUCOTROL XL) 10 MG 24 hr tablet Take 1 tablet (10 mg total) by mouth daily with breakfast. for diabetes. 05/29/23   Karie Schwalbe, MD  glucose blood (ACCU-CHEK AVIVA PLUS) test strip Check blood sugar before breakfast, lunch and bedtime and as directed. Dx E11.65 07/13/17   Doreene Nest, NP  insulin NPH-regular Human (70-30) 100 UNIT/ML injection Inject 5 Units into the skin 2 (two) times daily with a meal.    [provider]  Lancets (ACCU-CHEK SOFT TOUCH) lancets Check blood sugar before breakfast, lunch and bedtime and as directed. Dx E11.65 07/13/17   Doreene Nest, NP  lidocaine-prilocaine (EMLA) cream Apply 1 Application topically as needed. 06/29/23   Si Gaul, MD  lisinopril (ZESTRIL) 10 MG tablet Take 1 tablet (10 mg total) by mouth daily. 10/28/22   Karie Schwalbe, MD  oxyCODONE-acetaminophen (PERCOCET/ROXICET) 5-325 MG tablet Take 1 tablet by mouth every 8 (eight) hours as needed for severe pain (pain score 7-10). 07/10/23   Si Gaul, MD  pravastatin (PRAVACHOL) 40 MG tablet Take 1 tablet (40 mg total) by mouth daily. For cholesterol. 07/20/21   Doreene Nest, NP  prochlorperazine (COMPAZINE) 10 MG tablet  Take 1 tablet (10 mg total) by mouth every 6 (six) hours as needed for nausea or vomiting. 06/29/23   Si Gaul, MD     Family History  Problem Relation Age of Onset   Diabetes Father    Diabetes Paternal Grandfather    Diabetes Paternal Grandmother    Colon cancer Neg Hx    Crohn's disease Neg Hx    Rectal cancer Neg Hx    Stomach cancer Neg Hx    Esophageal cancer Neg Hx     Social History   Socioeconomic  History   Marital status: Married    Spouse name: Not on file   Number of children: Not on file   Years of education: Not on file   Highest education level: Not on file  Occupational History   Not on file  Tobacco Use   Smoking status: Former    Current packs/day: 0.00    Average packs/day: 0.5 packs/day for 30.0 years (15.0 ttl pk-yrs)    Types: Cigarettes, Cigars    Start date: 10/1988    Quit date: 10/2018    Years since quitting: 4.7   Smokeless tobacco: Never  Vaping Use   Vaping status: Never Used  Substance and Sexual Activity   Alcohol use: Yes    Alcohol/week: 6.0 standard drinks of alcohol    Types: 6 Cans of beer per week    Comment: occasional   Drug use: No   Sexual activity: Not on file  Other Topics Concern   Not on file  Social History Narrative   Divorced.   2 children, 1 grandchild.   Retired. Once worked as a Financial controller.    Enjoys relaxing.    Social Drivers of Corporate investment banker Strain: Not on file  Food Insecurity: Not on file  Transportation Needs: Not on file  Physical Activity: Not on file  Stress: Not on file  Social Connections: Not on file       Review of Systems  Vital Signs:   Advance Care Plan:   Physical Exam  Imaging: No results found.  Labs:  CBC: Recent Labs    05/25/23 1327 06/22/23 1353 07/03/23 0739 07/10/23 1502  WBC 6.9 8.2 8.8 42.5*  HGB 14.0 12.9* 12.2* 9.7*  HCT 40.6 38.9* 37.3* 29.7*  PLT 209 292 251 239    COAGS: No results for input(s): "INR", "APTT" in the last 8760 hours.  BMP: Recent Labs    05/25/23 1327 06/22/23 1353 07/03/23 0739 07/10/23 1502  NA 136 137 138 138  K 4.0 4.8 3.9 4.8  CL 98 101 102 103  CO2 31 30 27 27   GLUCOSE 437* 260* 216* 165*  BUN 15 31* 25* 20  CALCIUM 10.8* 11.7* 11.3* 11.0*  CREATININE 1.32* 1.70* 1.87* 1.34*  GFRNONAA 57* 42* 38* 56*    LIVER FUNCTION TESTS: Recent Labs    05/25/23 1327 06/22/23 1353 07/03/23 0739 07/10/23 1502   BILITOT 0.7 0.5 0.5 0.5  AST 12* 14* 14* 16  ALT 11 13 8 11   ALKPHOS 251* 207* 177* 270*  PROT 8.0 7.4 7.1 6.7  ALBUMIN 4.8 4.3 4.2 3.9    TUMOR MARKERS: No results for input(s): "AFPTM", "CEA", "CA199", "CHROMGRNA" in the last 8760 hours.  Assessment and Plan: 73 y.o. male ex smoker with PMH sig for DM, HTN, CKD, and stage IV (T3, N2, M1 C) non-small cell lung cancer, adenocarcinoma who presented with large left upper lobe lung mass with left hilar  and mediastinal invasion in addition to thoracic inlet lymphadenopathy as well as several metastatic bone lesions involving the spine, ribs as well as the right iliac bone diagnosed in December 2024. He is scheduled today for port a cath placement to assist with treatment.Risks and benefits of image guided port-a-catheter placement was discussed with the patient including, but not limited to bleeding, infection, pneumothorax, or fibrin sheath development and need for additional procedures.  All of the patient's questions were answered, patient is agreeable to proceed. Consent signed and in chart.    Thank you for allowing our service to participate in SHEEHAN STACEY 's care.  Electronically Signed: D. Jeananne Rama, PA-C   07/11/2023, 2:06 PM      I spent a total of  15 Minutes   in face to face in clinical consultation, greater than 50% of which was counseling/coordinating care for port a cath placement

## 2023-07-11 NOTE — Telephone Encounter (Unsigned)
Copied from CRM [id number]. Topic: Clinical - Medication Refill >> Jul 11, 2023 11:34 AM Bo Mcclintock wrote: Most Recent Primary Care Visit:  Provider: Tillman Abide I  Department: [facility]  Visit Type: OFFICE VISIT  Date: 05/29/2023  Medication: lisinopril (ZESTRIL) 10 MG tablet   Has the patient contacted their pharmacy? Yes (Agent: If no, request that the patient contact the pharmacy for the refill. If patient does not wish to contact the pharmacy document the reason why and proceed with request.) (Agent: If yes, when and what did the pharmacy advise?) No more refills available  Is this the correct pharmacy for this prescription? Yes If no, delete pharmacy and type the correct one.  This is the patient's preferred pharmacy:  [facility] [address], [place] - [address] [place] [address] Phone: [phone] Fax: [phone]    Has the prescription been filled recently? No  Is the patient out of the medication? Yes  Has the patient been seen for an appointment in the last year OR does the patient have an upcoming appointment? Yes  Can we respond through MyChart? No  Agent: Please be advised that Rx refills may take up to 3 business days. We ask that you follow-up with your pharmacy.

## 2023-07-11 NOTE — Telephone Encounter (Signed)
Pt needs a diabetes follow up scheduled with Mayra Reel this month.

## 2023-07-11 NOTE — Telephone Encounter (Signed)
Tried to contact patient in regards to recent lab results.  Per Cassie, PA-  WBC is high. Patient got Udenyca injection on 1/22.  LVM with patient to check on how patient is doing and to look out for signs of infection like fever, chills, sweats, congestion, diarrhea or vomiting. Asked for a return call if any questions.

## 2023-07-11 NOTE — Telephone Encounter (Signed)
lvmtcb

## 2023-07-11 NOTE — Telephone Encounter (Signed)
Not our patient

## 2023-07-11 NOTE — Progress Notes (Unsigned)
I reached out to the [facility] imaging reading room to request  the reports from pt's MRI from 1/25 and PET scan from 1/27 be available for pt's f/u on 1/29. Spoke to [place] [place].

## 2023-07-12 ENCOUNTER — Other Ambulatory Visit: Payer: Self-pay

## 2023-07-12 ENCOUNTER — Encounter: Payer: Self-pay | Admitting: Internal Medicine

## 2023-07-12 ENCOUNTER — Ambulatory Visit
Admission: RE | Admit: 2023-07-12 | Discharge: 2023-07-12 | Disposition: A | Discharge status: Home / Self Care | Payer: Medicare HMO | Source: Ambulatory Visit | Attending: Radiation Oncology | Admitting: Radiation Oncology

## 2023-07-12 ENCOUNTER — Inpatient Hospital Stay (HOSPITAL_BASED_OUTPATIENT_CLINIC_OR_DEPARTMENT_OTHER): Payer: Medicare HMO | Admitting: Physician Assistant

## 2023-07-12 ENCOUNTER — Inpatient Hospital Stay (HOSPITAL_BASED_OUTPATIENT_CLINIC_OR_DEPARTMENT_OTHER): Payer: Medicare HMO | Admitting: Nurse Practitioner

## 2023-07-12 ENCOUNTER — Encounter: Payer: Self-pay | Admitting: Nurse Practitioner

## 2023-07-12 VITALS — BP 102/72 | HR 106 | Temp 97.4°F | Resp 18 | Ht 73.0 in | Wt 154.9 lb

## 2023-07-12 DIAGNOSIS — Z87891 Personal history of nicotine dependence: Secondary | ICD-10-CM | POA: Diagnosis not present

## 2023-07-12 DIAGNOSIS — C3490 Malignant neoplasm of unspecified part of unspecified bronchus or lung: Secondary | ICD-10-CM

## 2023-07-12 DIAGNOSIS — Z7189 Other specified counseling: Secondary | ICD-10-CM | POA: Diagnosis not present

## 2023-07-12 DIAGNOSIS — C3412 Malignant neoplasm of upper lobe, left bronchus or lung: Secondary | ICD-10-CM | POA: Diagnosis not present

## 2023-07-12 DIAGNOSIS — M899 Disorder of bone, unspecified: Secondary | ICD-10-CM | POA: Diagnosis not present

## 2023-07-12 DIAGNOSIS — J432 Centrilobular emphysema: Secondary | ICD-10-CM | POA: Diagnosis not present

## 2023-07-12 DIAGNOSIS — R042 Hemoptysis: Secondary | ICD-10-CM | POA: Diagnosis not present

## 2023-07-12 DIAGNOSIS — Z515 Encounter for palliative care: Secondary | ICD-10-CM | POA: Diagnosis not present

## 2023-07-12 DIAGNOSIS — G893 Neoplasm related pain (acute) (chronic): Secondary | ICD-10-CM | POA: Diagnosis not present

## 2023-07-12 DIAGNOSIS — D171 Benign lipomatous neoplasm of skin and subcutaneous tissue of trunk: Secondary | ICD-10-CM | POA: Diagnosis not present

## 2023-07-12 DIAGNOSIS — Z510 Encounter for antineoplastic radiation therapy: Secondary | ICD-10-CM | POA: Diagnosis not present

## 2023-07-12 DIAGNOSIS — C7951 Secondary malignant neoplasm of bone: Secondary | ICD-10-CM | POA: Diagnosis not present

## 2023-07-12 DIAGNOSIS — R530 Neoplastic (malignant) related fatigue: Secondary | ICD-10-CM | POA: Diagnosis not present

## 2023-07-12 DIAGNOSIS — K5900 Constipation, unspecified: Secondary | ICD-10-CM | POA: Diagnosis not present

## 2023-07-12 LAB — RAD ONC ARIA SESSION SUMMARY
Course Elapsed Days: 2
Plan Fractions Treated to Date: 3
Plan Prescribed Dose Per Fraction: 3 Gy
Plan Total Fractions Prescribed: 10
Plan Total Prescribed Dose: 30 Gy
Reference Point Dosage Given to Date: 9 Gy
Reference Point Session Dosage Given: 3 Gy
Session Number: 3

## 2023-07-13 ENCOUNTER — Telehealth: Payer: Self-pay

## 2023-07-13 ENCOUNTER — Other Ambulatory Visit: Payer: Self-pay

## 2023-07-13 ENCOUNTER — Ambulatory Visit (HOSPITAL_COMMUNITY)
Admission: RE | Admit: 2023-07-13 | Discharge: 2023-07-13 | Disposition: A | Discharge status: Home / Self Care | Payer: Medicare HMO | Source: Ambulatory Visit | Attending: Internal Medicine | Admitting: Internal Medicine

## 2023-07-13 ENCOUNTER — Encounter (HOSPITAL_COMMUNITY): Payer: Self-pay

## 2023-07-13 ENCOUNTER — Ambulatory Visit
Admission: RE | Admit: 2023-07-13 | Discharge: 2023-07-13 | Disposition: A | Discharge status: Home / Self Care | Payer: Medicare HMO | Source: Ambulatory Visit | Attending: Radiation Oncology | Admitting: Radiation Oncology

## 2023-07-13 DIAGNOSIS — M899 Disorder of bone, unspecified: Secondary | ICD-10-CM | POA: Diagnosis not present

## 2023-07-13 DIAGNOSIS — K5900 Constipation, unspecified: Secondary | ICD-10-CM | POA: Diagnosis not present

## 2023-07-13 DIAGNOSIS — Z452 Encounter for adjustment and management of vascular access device: Secondary | ICD-10-CM | POA: Diagnosis not present

## 2023-07-13 DIAGNOSIS — I129 Hypertensive chronic kidney disease with stage 1 through stage 4 chronic kidney disease, or unspecified chronic kidney disease: Secondary | ICD-10-CM | POA: Insufficient documentation

## 2023-07-13 DIAGNOSIS — C7951 Secondary malignant neoplasm of bone: Secondary | ICD-10-CM | POA: Insufficient documentation

## 2023-07-13 DIAGNOSIS — Z794 Long term (current) use of insulin: Secondary | ICD-10-CM | POA: Insufficient documentation

## 2023-07-13 DIAGNOSIS — Z510 Encounter for antineoplastic radiation therapy: Secondary | ICD-10-CM | POA: Diagnosis not present

## 2023-07-13 DIAGNOSIS — J432 Centrilobular emphysema: Secondary | ICD-10-CM | POA: Diagnosis not present

## 2023-07-13 DIAGNOSIS — C3412 Malignant neoplasm of upper lobe, left bronchus or lung: Secondary | ICD-10-CM | POA: Diagnosis not present

## 2023-07-13 DIAGNOSIS — G893 Neoplasm related pain (acute) (chronic): Secondary | ICD-10-CM | POA: Diagnosis not present

## 2023-07-13 DIAGNOSIS — Z87891 Personal history of nicotine dependence: Secondary | ICD-10-CM | POA: Insufficient documentation

## 2023-07-13 DIAGNOSIS — C3492 Malignant neoplasm of unspecified part of left bronchus or lung: Secondary | ICD-10-CM | POA: Insufficient documentation

## 2023-07-13 DIAGNOSIS — Z7984 Long term (current) use of oral hypoglycemic drugs: Secondary | ICD-10-CM | POA: Insufficient documentation

## 2023-07-13 DIAGNOSIS — R042 Hemoptysis: Secondary | ICD-10-CM | POA: Diagnosis not present

## 2023-07-13 DIAGNOSIS — C3490 Malignant neoplasm of unspecified part of unspecified bronchus or lung: Secondary | ICD-10-CM

## 2023-07-13 DIAGNOSIS — N184 Chronic kidney disease, stage 4 (severe): Secondary | ICD-10-CM | POA: Insufficient documentation

## 2023-07-13 DIAGNOSIS — D171 Benign lipomatous neoplasm of skin and subcutaneous tissue of trunk: Secondary | ICD-10-CM | POA: Diagnosis not present

## 2023-07-13 DIAGNOSIS — E1122 Type 2 diabetes mellitus with diabetic chronic kidney disease: Secondary | ICD-10-CM | POA: Insufficient documentation

## 2023-07-13 HISTORY — PX: IR IMAGING GUIDED PORT INSERTION: IMG5740

## 2023-07-13 LAB — RAD ONC ARIA SESSION SUMMARY
Course Elapsed Days: 3
Plan Fractions Treated to Date: 4
Plan Prescribed Dose Per Fraction: 3 Gy
Plan Total Fractions Prescribed: 10
Plan Total Prescribed Dose: 30 Gy
Reference Point Dosage Given to Date: 12 Gy
Reference Point Session Dosage Given: 3 Gy
Session Number: 4

## 2023-07-13 LAB — GLUCOSE, CAPILLARY: Glucose-Capillary: 99 mg/dL (ref 70–99)

## 2023-07-13 MED ORDER — MIDAZOLAM HCL 2 MG/2ML IJ SOLN
INTRAMUSCULAR | Status: AC | PRN
  Administered 2023-07-13: 1 mg via INTRAVENOUS

## 2023-07-13 MED ORDER — MIDAZOLAM HCL 2 MG/2ML IJ SOLN
INTRAMUSCULAR | Status: AC
  Filled 2023-07-13: qty 2

## 2023-07-13 MED ORDER — FENTANYL CITRATE (PF) 100 MCG/2ML IJ SOLN
INTRAMUSCULAR | Status: AC
  Filled 2023-07-13: qty 2

## 2023-07-13 MED ORDER — MIDAZOLAM HCL 2 MG/2ML IJ SOLN
INTRAMUSCULAR | Status: AC | PRN
  Administered 2023-07-13: .5 mg via INTRAVENOUS

## 2023-07-13 MED ORDER — LIDOCAINE-EPINEPHRINE 1 %-1:100000 IJ SOLN
INTRAMUSCULAR | Status: AC
  Filled 2023-07-13: qty 1

## 2023-07-13 MED ORDER — LIDOCAINE-EPINEPHRINE 1 %-1:100000 IJ SOLN: 20.0000 mL | Freq: Once | INTRAMUSCULAR | Status: DC

## 2023-07-13 MED ORDER — FENTANYL CITRATE (PF) 100 MCG/2ML IJ SOLN
INTRAMUSCULAR | Status: AC | PRN
  Administered 2023-07-13: 25 ug via INTRAVENOUS

## 2023-07-13 MED ORDER — HEPARIN SOD (PORK) LOCK FLUSH 100 UNIT/ML IV SOLN: 500.0000 [IU] | Freq: Once | INTRAVENOUS | Status: DC

## 2023-07-13 MED ORDER — HEPARIN SOD (PORK) LOCK FLUSH 100 UNIT/ML IV SOLN
INTRAVENOUS | Status: AC
Start: 2023-07-13 — End: ?
  Filled 2023-07-13: qty 5

## 2023-07-13 MED ORDER — FENTANYL CITRATE (PF) 100 MCG/2ML IJ SOLN
INTRAMUSCULAR | Status: AC | PRN
  Administered 2023-07-13: 50 ug via INTRAVENOUS

## 2023-07-13 MED ORDER — SODIUM CHLORIDE 0.9 % IV SOLN: INTRAVENOUS | Status: DC

## 2023-07-13 NOTE — Progress Notes (Signed)
1230 Ice bag given to use as needed to right upper neck and right upper chest for comfort.

## 2023-07-13 NOTE — Telephone Encounter (Signed)
-----   Message from [place], [place] E sent at 07/12/2023  8:19 AM EST ----- Leotis Shames,   I spoke with you about this patient, pathological fracture of femoral neck, he is seeing oncology. If we could get him in quickly with Dr. August Saucer that would be great. Thanks for your help with this matter.   Megan ----- Message ----- From: Margaretmary Dys, MD Sent: 07/11/2023   6:15 PM EST To: Marcello Fennel, PA-C; Juanda Chance, NP  Aundra Millet, During his PET, he was found to have cortical destruction of the left femoral neck with evolving pathologic fracture.  He needs a rapid ortho evaluation for left femoral neck stabilization.  Can you help direct [place] about the quickest way to be seen. MM

## 2023-07-13 NOTE — Telephone Encounter (Signed)
Friday am if possible - or with luke in pm - important to do nwb with crutches

## 2023-07-13 NOTE — Telephone Encounter (Signed)
Nonweightbearing left leg with crutches.  He should come in today at 4:00 and we may end up fixing him tomorrow.  Thanks

## 2023-07-13 NOTE — Telephone Encounter (Signed)
Please review and advise how soon you would like for patient to be worked in.

## 2023-07-13 NOTE — Procedures (Signed)
Vascular and Interventional Radiology Procedure Note  Patient: Jonathan Page DOB: 09-Sep-1950 Medical Record Number: [id number] Note Date/Time: 07/13/23 1:32 PM   Performing Physician: Roanna Banning, MD Assistant(s): None  Diagnosis: Lung cancer  Procedure: PORT PLACEMENT  Anesthesia: Conscious Sedation Complications: None Estimated Blood Loss: Minimal  Findings:  Successful right-sided port placement, with the tip of the catheter in the proximal right atrium.  Plan: Catheter ready for use.  See detailed procedure note with images in PACS. The patient tolerated the procedure well without incident or complication and was returned to Recovery in stable condition.    Roanna Banning, MD Vascular and Interventional Radiology [facility] Radiology   Pager. [phone] Clinic. [phone]

## 2023-07-13 NOTE — Discharge Instructions (Signed)
Discharge Instructions:   Please call Interventional Radiology clinic [phone] with any questions or concerns.  You may remove your bandaid to right upper neck and dressing to right upper chest and shower tomorrow.  Do not use EMLA / Lidocaine cream for 2 weeks post Port Insertion this will remove the surgical glue.  Moderate Conscious Sedation, Adult, Care After This sheet gives you information about how to care for yourself after your procedure. Your health care provider may also give you more specific instructions. If you have problems or questions, contact your health care provider. What can I expect after the procedure? After the procedure, it is common to have: Sleepiness for several hours. Impaired judgment for several hours. Difficulty with balance. Vomiting if you eat too soon. Follow these instructions at home: For the time period you were told by your health care provider: Rest. Do not participate in activities where you could fall or become injured. Do not drive or use machinery. Do not drink alcohol. Do not take sleeping pills or medicines that cause drowsiness. Do not make important decisions or sign legal documents. Do not take care of children on your own. Eating and drinking  Follow the diet recommended by your health care provider. Drink enough fluid to keep your urine pale yellow. If you vomit: Drink water, juice, or soup when you can drink without vomiting. Make sure you have little or no nausea before eating solid foods. General instructions Take over-the-counter and prescription medicines only as told by your health care provider. Have a responsible adult stay with you for the time you are told. It is important to have someone help care for you until you are awake and alert. Do not smoke. Keep all follow-up visits as told by your health care provider. This is important. Contact a health care provider if: You are still sleepy or having trouble with  balance after 24 hours. You feel light-headed. You keep feeling nauseous or you keep vomiting. You develop a rash. You have a fever. You have redness or swelling around the IV site. Get help right away if: You have trouble breathing. You have new-onset confusion at home. Summary After the procedure, it is common to feel sleepy, have impaired judgment, or feel nauseous if you eat too soon. Rest after you get home. Know the things you should not do after the procedure. Follow the diet recommended by your health care provider and drink enough fluid to keep your urine pale yellow. Get help right away if you have trouble breathing or new-onset confusion at home. This information is not intended to replace advice given to you by your health care provider. Make sure you discuss any questions you have with your health care provider. Document Revised: 09/27/2019 Document Reviewed: 04/25/2019 Elsevier Patient Education  2023 [employer] Inc[employer]    Implanted [place] Insertion, Care After The following information offers guidance on how to care for yourself after your procedure. Your health care provider may also give you more specific instructions. If you have problems or questions, contact your health care provider. What can I expect after the procedure? After the procedure, it is common to have: Discomfort at the port insertion site. Bruising on the skin over the port. This should improve over 3-4 days. Follow these instructions at home: [facility] care After your port is placed, you will get a manufacturer's information card. The card has information about your port. Keep this card with you at all times. Take care of the port as told by  your health care provider. Ask your health care provider if you or a family member can get training for taking care of the port at home. A home health care nurse will be be available to help care for the port. Make sure to remember what type of port you have. Incision  care     Follow instructions from your health care provider about how to take care of your port insertion site. Make sure you: Wash your hands with soap and water for at least 20 seconds before and after you change your bandage (dressing). If soap and water are not available, use hand sanitizer. Change your dressing as told by your health care provider. Leave stitches (sutures), skin glue, or adhesive strips in place. These skin closures may need to stay in place for 2 weeks or longer. If adhesive strip edges start to loosen and curl up, you may trim the loose edges. Do not remove adhesive strips completely unless your health care provider tells you to do that. Check your port insertion site every day for signs of infection. Check for: Redness, swelling, or pain. Fluid or blood. Warmth. Pus or a bad smell. Activity Return to your normal activities as told by your health care provider. Ask your health care provider what activities are safe for you. You may have to avoid lifting. Ask your health care provider how much you can safely lift. General instructions Take over-the-counter and prescription medicines only as told by your health care provider. Do not take baths, swim, or use a hot tub until your health care provider approves. Ask your health care provider if you may take showers. You may only be allowed to take sponge baths. If you were given a sedative during the procedure, it can affect you for several hours. Do not drive or operate machinery until your health care provider says that it is safe. Wear a medical alert bracelet in case of an emergency. This will tell any health care providers that you have a port. Keep all follow-up visits. This is important. Contact a health care provider if: You cannot flush your port with saline as directed, or you cannot draw blood from the port. You have a fever or chills. You have redness, swelling, or pain around your port insertion site. You have  fluid or blood coming from your port insertion site. Your port insertion site feels warm to the touch. You have pus or a bad smell coming from the port insertion site. Get help right away if: You have chest pain or shortness of breath. You have bleeding from your port that you cannot control. These symptoms may be an emergency. Get help right away. Call 911. Do not wait to see if the symptoms will go away. Do not drive yourself to the hospital. Summary Take care of the port as told by your health care provider. Keep the manufacturer's information card with you at all times. Change your dressing as told by your health care provider. Contact a health care provider if you have a fever or chills or if you have redness, swelling, or pain around your port insertion site. Keep all follow-up visits. This information is not intended to replace advice given to you by your health care provider. Make sure you discuss any questions you have with your health care provider. Document Revised: 12/01/2020 Document Reviewed: 12/01/2020 Elsevier Patient Education  2023 [employer].

## 2023-07-14 ENCOUNTER — Other Ambulatory Visit: Payer: Self-pay

## 2023-07-14 ENCOUNTER — Ambulatory Visit
Admission: RE | Admit: 2023-07-14 | Discharge: 2023-07-14 | Disposition: A | Discharge status: Home / Self Care | Payer: Medicare HMO | Source: Ambulatory Visit | Attending: Radiation Oncology

## 2023-07-14 ENCOUNTER — Ambulatory Visit
Admission: RE | Admit: 2023-07-14 | Discharge: 2023-07-14 | Disposition: A | Discharge status: Home / Self Care | Payer: Medicare HMO | Source: Ambulatory Visit | Attending: Radiation Oncology | Admitting: Radiation Oncology

## 2023-07-14 DIAGNOSIS — R042 Hemoptysis: Secondary | ICD-10-CM | POA: Diagnosis not present

## 2023-07-14 DIAGNOSIS — D171 Benign lipomatous neoplasm of skin and subcutaneous tissue of trunk: Secondary | ICD-10-CM | POA: Diagnosis not present

## 2023-07-14 DIAGNOSIS — G893 Neoplasm related pain (acute) (chronic): Secondary | ICD-10-CM | POA: Diagnosis not present

## 2023-07-14 DIAGNOSIS — Z510 Encounter for antineoplastic radiation therapy: Secondary | ICD-10-CM | POA: Diagnosis not present

## 2023-07-14 DIAGNOSIS — J432 Centrilobular emphysema: Secondary | ICD-10-CM | POA: Diagnosis not present

## 2023-07-14 DIAGNOSIS — Z87891 Personal history of nicotine dependence: Secondary | ICD-10-CM | POA: Diagnosis not present

## 2023-07-14 DIAGNOSIS — M899 Disorder of bone, unspecified: Secondary | ICD-10-CM | POA: Diagnosis not present

## 2023-07-14 DIAGNOSIS — C7951 Secondary malignant neoplasm of bone: Secondary | ICD-10-CM | POA: Diagnosis not present

## 2023-07-14 DIAGNOSIS — K5900 Constipation, unspecified: Secondary | ICD-10-CM | POA: Diagnosis not present

## 2023-07-14 DIAGNOSIS — C3412 Malignant neoplasm of upper lobe, left bronchus or lung: Secondary | ICD-10-CM | POA: Diagnosis not present

## 2023-07-14 LAB — RAD ONC ARIA SESSION SUMMARY
Course Elapsed Days: 4
Plan Fractions Treated to Date: 5
Plan Prescribed Dose Per Fraction: 3 Gy
Plan Total Fractions Prescribed: 10
Plan Total Prescribed Dose: 30 Gy
Reference Point Dosage Given to Date: 15 Gy
Reference Point Session Dosage Given: 3 Gy
Session Number: 5

## 2023-07-14 NOTE — Telephone Encounter (Signed)
Soonest patient could come was Wednesday. Appt scheduled for Wednesday at 1045am

## 2023-07-14 NOTE — Telephone Encounter (Signed)
Yes thx nwb with crutches thx

## 2023-07-17 ENCOUNTER — Inpatient Hospital Stay: Payer: Medicare HMO | Attending: Physician Assistant

## 2023-07-17 ENCOUNTER — Other Ambulatory Visit: Payer: Self-pay

## 2023-07-17 ENCOUNTER — Ambulatory Visit
Admission: RE | Admit: 2023-07-17 | Discharge: 2023-07-17 | Disposition: A | Discharge status: Home / Self Care | Payer: Medicare HMO | Source: Ambulatory Visit | Attending: Radiation Oncology

## 2023-07-17 DIAGNOSIS — Z95828 Presence of other vascular implants and grafts: Secondary | ICD-10-CM | POA: Insufficient documentation

## 2023-07-17 DIAGNOSIS — Z5111 Encounter for antineoplastic chemotherapy: Secondary | ICD-10-CM | POA: Diagnosis not present

## 2023-07-17 DIAGNOSIS — G8918 Other acute postprocedural pain: Secondary | ICD-10-CM | POA: Diagnosis not present

## 2023-07-17 DIAGNOSIS — F17210 Nicotine dependence, cigarettes, uncomplicated: Secondary | ICD-10-CM | POA: Insufficient documentation

## 2023-07-17 DIAGNOSIS — E785 Hyperlipidemia, unspecified: Secondary | ICD-10-CM | POA: Insufficient documentation

## 2023-07-17 DIAGNOSIS — R197 Diarrhea, unspecified: Secondary | ICD-10-CM | POA: Insufficient documentation

## 2023-07-17 DIAGNOSIS — E1122 Type 2 diabetes mellitus with diabetic chronic kidney disease: Secondary | ICD-10-CM | POA: Insufficient documentation

## 2023-07-17 DIAGNOSIS — Z510 Encounter for antineoplastic radiation therapy: Secondary | ICD-10-CM | POA: Insufficient documentation

## 2023-07-17 DIAGNOSIS — C3412 Malignant neoplasm of upper lobe, left bronchus or lung: Secondary | ICD-10-CM | POA: Insufficient documentation

## 2023-07-17 DIAGNOSIS — Z833 Family history of diabetes mellitus: Secondary | ICD-10-CM | POA: Insufficient documentation

## 2023-07-17 DIAGNOSIS — I129 Hypertensive chronic kidney disease with stage 1 through stage 4 chronic kidney disease, or unspecified chronic kidney disease: Secondary | ICD-10-CM | POA: Insufficient documentation

## 2023-07-17 DIAGNOSIS — J90 Pleural effusion, not elsewhere classified: Secondary | ICD-10-CM | POA: Diagnosis not present

## 2023-07-17 DIAGNOSIS — R519 Headache, unspecified: Secondary | ICD-10-CM | POA: Insufficient documentation

## 2023-07-17 DIAGNOSIS — N183 Chronic kidney disease, stage 3 unspecified: Secondary | ICD-10-CM | POA: Insufficient documentation

## 2023-07-17 DIAGNOSIS — N200 Calculus of kidney: Secondary | ICD-10-CM | POA: Insufficient documentation

## 2023-07-17 DIAGNOSIS — M25559 Pain in unspecified hip: Secondary | ICD-10-CM | POA: Insufficient documentation

## 2023-07-17 DIAGNOSIS — Z87891 Personal history of nicotine dependence: Secondary | ICD-10-CM | POA: Diagnosis not present

## 2023-07-17 DIAGNOSIS — C778 Secondary and unspecified malignant neoplasm of lymph nodes of multiple regions: Secondary | ICD-10-CM | POA: Insufficient documentation

## 2023-07-17 DIAGNOSIS — D171 Benign lipomatous neoplasm of skin and subcutaneous tissue of trunk: Secondary | ICD-10-CM | POA: Insufficient documentation

## 2023-07-17 DIAGNOSIS — R0789 Other chest pain: Secondary | ICD-10-CM | POA: Insufficient documentation

## 2023-07-17 DIAGNOSIS — M25519 Pain in unspecified shoulder: Secondary | ICD-10-CM | POA: Diagnosis not present

## 2023-07-17 DIAGNOSIS — R042 Hemoptysis: Secondary | ICD-10-CM | POA: Insufficient documentation

## 2023-07-17 DIAGNOSIS — K5900 Constipation, unspecified: Secondary | ICD-10-CM | POA: Insufficient documentation

## 2023-07-17 DIAGNOSIS — F17290 Nicotine dependence, other tobacco product, uncomplicated: Secondary | ICD-10-CM | POA: Insufficient documentation

## 2023-07-17 DIAGNOSIS — Z923 Personal history of irradiation: Secondary | ICD-10-CM | POA: Diagnosis not present

## 2023-07-17 DIAGNOSIS — R531 Weakness: Secondary | ICD-10-CM | POA: Diagnosis not present

## 2023-07-17 DIAGNOSIS — J439 Emphysema, unspecified: Secondary | ICD-10-CM | POA: Diagnosis not present

## 2023-07-17 DIAGNOSIS — C7951 Secondary malignant neoplasm of bone: Secondary | ICD-10-CM | POA: Insufficient documentation

## 2023-07-17 DIAGNOSIS — G893 Neoplasm related pain (acute) (chronic): Secondary | ICD-10-CM | POA: Insufficient documentation

## 2023-07-17 DIAGNOSIS — Z5112 Encounter for antineoplastic immunotherapy: Secondary | ICD-10-CM | POA: Diagnosis not present

## 2023-07-17 DIAGNOSIS — R490 Dysphonia: Secondary | ICD-10-CM | POA: Insufficient documentation

## 2023-07-17 DIAGNOSIS — I700 Atherosclerosis of aorta: Secondary | ICD-10-CM | POA: Insufficient documentation

## 2023-07-17 DIAGNOSIS — N281 Cyst of kidney, acquired: Secondary | ICD-10-CM | POA: Insufficient documentation

## 2023-07-17 DIAGNOSIS — J432 Centrilobular emphysema: Secondary | ICD-10-CM | POA: Insufficient documentation

## 2023-07-17 DIAGNOSIS — M899 Disorder of bone, unspecified: Secondary | ICD-10-CM | POA: Insufficient documentation

## 2023-07-17 DIAGNOSIS — Z79899 Other long term (current) drug therapy: Secondary | ICD-10-CM | POA: Insufficient documentation

## 2023-07-17 DIAGNOSIS — Z9049 Acquired absence of other specified parts of digestive tract: Secondary | ICD-10-CM | POA: Insufficient documentation

## 2023-07-17 DIAGNOSIS — R5383 Other fatigue: Secondary | ICD-10-CM | POA: Insufficient documentation

## 2023-07-17 DIAGNOSIS — D631 Anemia in chronic kidney disease: Secondary | ICD-10-CM | POA: Diagnosis not present

## 2023-07-17 LAB — RAD ONC ARIA SESSION SUMMARY
Course Elapsed Days: 7
Plan Fractions Treated to Date: 6
Plan Prescribed Dose Per Fraction: 3 Gy
Plan Total Fractions Prescribed: 10
Plan Total Prescribed Dose: 30 Gy
Reference Point Dosage Given to Date: 18 Gy
Reference Point Session Dosage Given: 3 Gy
Session Number: 6

## 2023-07-17 LAB — CMP (CANCER CENTER ONLY)
ALT: 29 U/L (ref 0–44)
AST: 30 U/L (ref 15–41)
Albumin: 3.8 g/dL (ref 3.5–5.0)
Alkaline Phosphatase: 215 U/L — ABNORMAL HIGH (ref 38–126)
Anion gap: 6 (ref 5–15)
BUN: 19 mg/dL (ref 8–23)
CO2: 27 mmol/L (ref 22–32)
Calcium: 9.8 mg/dL (ref 8.9–10.3)
Chloride: 106 mmol/L (ref 98–111)
Creatinine: 1.32 mg/dL — ABNORMAL HIGH (ref 0.61–1.24)
GFR, Estimated: 57 mL/min — ABNORMAL LOW (ref >60)
Glucose, Bld: 201 mg/dL — ABNORMAL HIGH (ref 70–99)
Potassium: 4.2 mmol/L (ref 3.5–5.1)
Sodium: 139 mmol/L (ref 135–145)
Total Bilirubin: 0.4 mg/dL (ref 0.0–1.2)
Total Protein: 6.3 g/dL — ABNORMAL LOW (ref 6.5–8.1)

## 2023-07-17 LAB — CBC WITH DIFFERENTIAL (CANCER CENTER ONLY)
Abs Immature Granulocytes: 1.05 10*3/uL — ABNORMAL HIGH (ref 0.00–0.07)
Basophils Absolute: 0.1 10*3/uL (ref 0.0–0.1)
Basophils Relative: 0 %
Eosinophils Absolute: 0.1 10*3/uL (ref 0.0–0.5)
Eosinophils Relative: 1 %
HCT: 28.3 % — ABNORMAL LOW (ref 39.0–52.0)
Hemoglobin: 9.1 g/dL — ABNORMAL LOW (ref 13.0–17.0)
Immature Granulocytes: 6 %
Lymphocytes Relative: 11 %
Lymphs Abs: 2 10*3/uL (ref 0.7–4.0)
MCH: 30.2 pg (ref 26.0–34.0)
MCHC: 32.2 g/dL (ref 30.0–36.0)
MCV: 94 fL (ref 80.0–100.0)
Monocytes Absolute: 1 10*3/uL (ref 0.1–1.0)
Monocytes Relative: 5 %
Neutro Abs: 14 10*3/uL — ABNORMAL HIGH (ref 1.7–7.7)
Neutrophils Relative %: 77 %
Platelet Count: 121 10*3/uL — ABNORMAL LOW (ref 150–400)
RBC: 3.01 MIL/uL — ABNORMAL LOW (ref 4.22–5.81)
RDW: 16.8 % — ABNORMAL HIGH (ref 11.5–15.5)
WBC Count: 18.2 10*3/uL — ABNORMAL HIGH (ref 4.0–10.5)
nRBC: 1.7 % — ABNORMAL HIGH (ref 0.0–0.2)

## 2023-07-17 MED ORDER — SODIUM CHLORIDE 0.9% FLUSH
10.0000 mL | Freq: Once | INTRAVENOUS | Status: AC
Start: 2023-07-17 — End: 2023-07-17
  Administered 2023-07-17: 10 mL

## 2023-07-17 MED ORDER — HEPARIN SOD (PORK) LOCK FLUSH 100 UNIT/ML IV SOLN
500.0000 [IU] | Freq: Once | INTRAVENOUS | Status: AC
  Administered 2023-07-17: 500 [IU]

## 2023-07-18 ENCOUNTER — Other Ambulatory Visit: Payer: Self-pay

## 2023-07-18 ENCOUNTER — Ambulatory Visit
Admission: RE | Admit: 2023-07-18 | Discharge: 2023-07-18 | Disposition: A | Discharge status: Home / Self Care | Payer: Medicare HMO | Source: Ambulatory Visit | Attending: Radiation Oncology | Admitting: Radiation Oncology

## 2023-07-18 DIAGNOSIS — C7951 Secondary malignant neoplasm of bone: Secondary | ICD-10-CM | POA: Diagnosis not present

## 2023-07-18 DIAGNOSIS — R197 Diarrhea, unspecified: Secondary | ICD-10-CM | POA: Diagnosis not present

## 2023-07-18 DIAGNOSIS — Z5112 Encounter for antineoplastic immunotherapy: Secondary | ICD-10-CM | POA: Diagnosis not present

## 2023-07-18 DIAGNOSIS — Z510 Encounter for antineoplastic radiation therapy: Secondary | ICD-10-CM | POA: Diagnosis not present

## 2023-07-18 DIAGNOSIS — C778 Secondary and unspecified malignant neoplasm of lymph nodes of multiple regions: Secondary | ICD-10-CM | POA: Diagnosis not present

## 2023-07-18 DIAGNOSIS — M899 Disorder of bone, unspecified: Secondary | ICD-10-CM | POA: Diagnosis not present

## 2023-07-18 DIAGNOSIS — Z5111 Encounter for antineoplastic chemotherapy: Secondary | ICD-10-CM | POA: Diagnosis not present

## 2023-07-18 DIAGNOSIS — Z87891 Personal history of nicotine dependence: Secondary | ICD-10-CM | POA: Diagnosis not present

## 2023-07-18 DIAGNOSIS — I129 Hypertensive chronic kidney disease with stage 1 through stage 4 chronic kidney disease, or unspecified chronic kidney disease: Secondary | ICD-10-CM | POA: Diagnosis not present

## 2023-07-18 DIAGNOSIS — C3412 Malignant neoplasm of upper lobe, left bronchus or lung: Secondary | ICD-10-CM | POA: Diagnosis not present

## 2023-07-18 LAB — RAD ONC ARIA SESSION SUMMARY
Course Elapsed Days: 8
Plan Fractions Treated to Date: 7
Plan Prescribed Dose Per Fraction: 3 Gy
Plan Total Fractions Prescribed: 10
Plan Total Prescribed Dose: 30 Gy
Reference Point Dosage Given to Date: 21 Gy
Reference Point Session Dosage Given: 3 Gy
Session Number: 7

## 2023-07-19 ENCOUNTER — Telehealth: Payer: Medicare HMO | Admitting: Acute Care

## 2023-07-19 ENCOUNTER — Ambulatory Visit
Admission: RE | Admit: 2023-07-19 | Discharge: 2023-07-19 | Disposition: A | Discharge status: Home / Self Care | Payer: Medicare HMO | Source: Ambulatory Visit | Attending: Radiation Oncology | Admitting: Radiation Oncology

## 2023-07-19 ENCOUNTER — Encounter: Payer: Self-pay | Admitting: Acute Care

## 2023-07-19 ENCOUNTER — Other Ambulatory Visit: Payer: Self-pay

## 2023-07-19 ENCOUNTER — Other Ambulatory Visit (INDEPENDENT_AMBULATORY_CARE_PROVIDER_SITE_OTHER): Payer: Self-pay

## 2023-07-19 ENCOUNTER — Encounter: Payer: Self-pay | Admitting: Orthopedic Surgery

## 2023-07-19 ENCOUNTER — Ambulatory Visit: Payer: Medicare HMO | Admitting: Orthopedic Surgery

## 2023-07-19 ENCOUNTER — Other Ambulatory Visit (INDEPENDENT_AMBULATORY_CARE_PROVIDER_SITE_OTHER): Payer: Medicare HMO

## 2023-07-19 DIAGNOSIS — M79605 Pain in left leg: Secondary | ICD-10-CM | POA: Diagnosis not present

## 2023-07-19 DIAGNOSIS — M79604 Pain in right leg: Secondary | ICD-10-CM | POA: Diagnosis not present

## 2023-07-19 DIAGNOSIS — Z87891 Personal history of nicotine dependence: Secondary | ICD-10-CM | POA: Diagnosis not present

## 2023-07-19 DIAGNOSIS — C7951 Secondary malignant neoplasm of bone: Secondary | ICD-10-CM

## 2023-07-19 DIAGNOSIS — R197 Diarrhea, unspecified: Secondary | ICD-10-CM | POA: Diagnosis not present

## 2023-07-19 DIAGNOSIS — S72002A Fracture of unspecified part of neck of left femur, initial encounter for closed fracture: Secondary | ICD-10-CM | POA: Diagnosis not present

## 2023-07-19 DIAGNOSIS — Z5112 Encounter for antineoplastic immunotherapy: Secondary | ICD-10-CM | POA: Diagnosis not present

## 2023-07-19 DIAGNOSIS — Z5111 Encounter for antineoplastic chemotherapy: Secondary | ICD-10-CM | POA: Diagnosis not present

## 2023-07-19 DIAGNOSIS — C3412 Malignant neoplasm of upper lobe, left bronchus or lung: Secondary | ICD-10-CM | POA: Diagnosis not present

## 2023-07-19 DIAGNOSIS — C778 Secondary and unspecified malignant neoplasm of lymph nodes of multiple regions: Secondary | ICD-10-CM | POA: Diagnosis not present

## 2023-07-19 DIAGNOSIS — M899 Disorder of bone, unspecified: Secondary | ICD-10-CM | POA: Diagnosis not present

## 2023-07-19 DIAGNOSIS — C3490 Malignant neoplasm of unspecified part of unspecified bronchus or lung: Secondary | ICD-10-CM

## 2023-07-19 DIAGNOSIS — I129 Hypertensive chronic kidney disease with stage 1 through stage 4 chronic kidney disease, or unspecified chronic kidney disease: Secondary | ICD-10-CM | POA: Diagnosis not present

## 2023-07-19 DIAGNOSIS — Z510 Encounter for antineoplastic radiation therapy: Secondary | ICD-10-CM | POA: Diagnosis not present

## 2023-07-19 LAB — RAD ONC ARIA SESSION SUMMARY
Course Elapsed Days: 9
Plan Fractions Treated to Date: 8
Plan Prescribed Dose Per Fraction: 3 Gy
Plan Total Fractions Prescribed: 10
Plan Total Prescribed Dose: 30 Gy
Reference Point Dosage Given to Date: 24 Gy
Reference Point Session Dosage Given: 3 Gy
Session Number: 8

## 2023-07-19 NOTE — Progress Notes (Signed)
 Office Visit Note   Patient: Jonathan Page           Date of Birth: November 24, 1950           MRN: [id number] Visit Date: 07/19/2023 Requested by: Gretta Comer POUR, NP [address] [place],  [place] [address] PCP: Gretta Comer POUR, NP  Subjective: Chief Complaint  Patient presents with   Other    Bilateral femur pain    HPI: JATORIAN Page is a 73 y.o. male who presents to the office reporting bilateral leg pain and left groin pain.  Patient has metastatic lung cancer.  Currently he will be finishing radiation treatment on Friday.  He is using a cane.  He has had a PET scan which is reviewed which does show metastatic disease in both proximal femurs.  On the left-hand side there is actually an early pathologic fracture on the tension side of the femoral neck.  Denies any recent falls.  Does have axial spine in the lumbar region metastatic disease being treated by Dr. Eldonna..                ROS: All systems reviewed are negative as they relate to the chief complaint within the history of present illness.  Patient denies fevers or chills.  Assessment & Plan: Visit Diagnoses:  1. Bilateral leg pain     Plan: Impression is impending pathologic fracture left proximal femur.  Intramedullary hip screw fixation indicated for this patient who is still active.  The rationale of doing this before it completely breaks is discussed with the patient.  In general it is a very straightforward procedure at this time with fairly minimal blood loss and minimal downtime for him.  However if he waits until it actually fractures then can procedure becomes much more morbid for him and his quality of life is more adversely affected for a longer period of time by delaying treatment.  All this is explained to him.  He asked me to talk to Dr. Gatha which I did and Dr. Gatha agrees that for quality of life issues treatment of this impending pathologic fracture is indicated.  Patient states that he will check  his schedule and let me know when he wants to get it fixed.  I strongly recommended that he fix this on an urgent basis.  Follow-Up Instructions: No follow-ups on file.   Orders:  Orders Placed This Encounter  Procedures   XR FEMUR, MIN 2 VIEWS RIGHT   XR FEMUR MIN 2 VIEWS LEFT   No orders of the defined types were placed in this encounter.     Procedures: No procedures performed   Clinical Data: No additional findings.  Objective: Vital Signs: There were no vitals taken for this visit.  Physical Exam:  Constitutional: Patient appears well-developed HEENT:  Head: Normocephalic Eyes:EOM are normal Neck: Normal range of motion Cardiovascular: Normal rate Pulmonary/chest: Effort normal Neurologic: Patient is alert Skin: Skin is warm Psychiatric: Patient has normal mood and affect  Ortho Exam: Ortho exam demonstrates minimal groin pain on the right with internal/external Tatian of the leg mild groin pain on the left.  He does have a little bit less hip flexion strength at 5- out of 5 on the left compared to the right.  No tenderness to palpation along either femur.  Abduction adduction strength 5+ out of 5 bilaterally.  He is able to walk with a cane with no limp.  Pedal pulses palpable.  Specialty Comments:  CLINICAL DATA:  Low back pain with radiculopathy.   EXAM: MRI LUMBAR SPINE WITHOUT CONTRAST   TECHNIQUE: Multiplanar, multisequence MR imaging of the lumbar spine was performed. No intravenous contrast was administered.   COMPARISON:  None Available.   FINDINGS: Segmentation:  Standard.   Alignment:  Mild degenerative anterolisthesis at L5-S1   Vertebrae: Multiple infiltrating bone lesions seen at every lumbar level and in the bilateral sacrum and ilium. Most notable is a confluent deposit at the L4 and L5 levels, right eccentric with growth through cortex along the right lumbar plexus and infiltrating the right L4-5 foramen especially. Epidural tumor  ventrally and on the right at this level. No acute fracture.   Conus medullaris and cauda equina: Conus extends to the T12 level. Conus and cauda equina appear normal.   Paraspinal and other soft tissues: Innumerable renal cysts. Extraosseous tumor emanating from the right L4 level with right psoas contact and implied lumbar plexus involvement.   Disc levels:   Ordinary and generalized disc ridging and facet spurring eccentric to the right. No degenerative impingement.   These results will be called to the ordering clinician or representative by the Radiologist Assistant, and communication documented in the PACS or [employer].   IMPRESSION: Findings of widespread bony metastasis with dominant deposit on the right at L4 and L5 where there is extraosseous tumor extension involving the right L4-5 foramen, epidural space, and the adjacent right lumbar plexus. Recommend postcontrast assessment and metastatic workup.     Electronically Signed   By: Dorn Roulette M.D.   On: 05/23/2023 07:27  Imaging: No results found.   PMFS History: Patient Active Problem List   Diagnosis Date Noted   Port-A-Cath in place 07/17/2023   Encounter for antineoplastic chemotherapy 07/03/2023   Encounter for antineoplastic immunotherapy 07/03/2023   Non-small cell lung cancer metastatic to bone (HCC) 06/22/2023   Lung mass 06/02/2023   Diabetes mellitus treated with oral medication (HCC) 05/29/2023   Restless legs syndrome (RLS) 05/29/2023   Chest pain 01/20/2023   Left leg pain 10/28/2022   Skin lesion 04/16/2021   Preventative health care 11/08/2017   Syncope and collapse 10/14/2016   Prolonged QT interval 10/14/2016   Hyperlipidemia 01/25/2016   CKD (chronic kidney disease) stage 3, GFR 30-59 ml/min (HCC) 01/25/2016   Essential hypertension 10/27/2015   Type II diabetes mellitus with nephropathy (HCC) 10/22/2015   Past Medical History:  Diagnosis Date   Allergy    Cancer  (HCC)    Cellulitis 02/21/2018   Concussion syndrome    Diabetes (HCC)    Frequent headaches    Hypertension     Family History  Problem Relation Age of Onset   Diabetes Father    Diabetes Paternal Grandfather    Diabetes Paternal Grandmother    Colon cancer Neg Hx    Crohn's disease Neg Hx    Rectal cancer Neg Hx    Stomach cancer Neg Hx    Esophageal cancer Neg Hx     Past Surgical History:  Procedure Laterality Date   APPENDECTOMY  1960   BRONCHIAL BIOPSY  06/12/2023   Procedure: BRONCHIAL BIOPSIES;  Surgeon: Brenna Adine CROME, DO;  Location: [facility];  Service: Cardiopulmonary;;   BRONCHIAL BRUSHINGS  06/12/2023   Procedure: BRONCHIAL BRUSHINGS;  Surgeon: Brenna Adine CROME, DO;  Location: [facility];  Service: Cardiopulmonary;;   COLONOSCOPY     IR IMAGING GUIDED PORT INSERTION  07/13/2023   VIDEO BRONCHOSCOPY Bilateral 06/12/2023   Procedure: VIDEO  BRONCHOSCOPY;  Surgeon: Brenna Adine CROME, DO;  Location: [facility];  Service: Cardiopulmonary;  Laterality: Bilateral;   WISDOM TOOTH EXTRACTION     Social History   Occupational History   Not on file  Tobacco Use   Smoking status: Former    Current packs/day: 0.00    Average packs/day: 0.5 packs/day for 30.0 years (15.0 ttl pk-yrs)    Types: Cigarettes, Cigars    Start date: 10/1988    Quit date: 10/2018    Years since quitting: 4.7   Smokeless tobacco: Never  Vaping Use   Vaping status: Never Used  Substance and Sexual Activity   Alcohol  use: Yes    Alcohol /week: 6.0 standard drinks of alcohol     Types: 6 Cans of beer per week    Comment: occasional   Drug use: No   Sexual activity: Not on file

## 2023-07-19 NOTE — Progress Notes (Signed)
 I had a video visit scheduled with the patient. He was unable to hear me, but I did see him on the camera. I called him to discuss his PET scan results. He had reviewed the results with oncology, and he has appointments with oncology,and orthopedics for pain management.  I have told him to follow up with us  as needed.  He verbalized understanding of the above.

## 2023-07-20 ENCOUNTER — Ambulatory Visit
Admission: RE | Admit: 2023-07-20 | Discharge: 2023-07-20 | Disposition: A | Discharge status: Home / Self Care | Payer: Medicare HMO | Source: Ambulatory Visit | Attending: Radiation Oncology | Admitting: Radiation Oncology

## 2023-07-20 ENCOUNTER — Other Ambulatory Visit: Payer: Self-pay

## 2023-07-20 DIAGNOSIS — Z510 Encounter for antineoplastic radiation therapy: Secondary | ICD-10-CM | POA: Diagnosis not present

## 2023-07-20 DIAGNOSIS — Z87891 Personal history of nicotine dependence: Secondary | ICD-10-CM | POA: Diagnosis not present

## 2023-07-20 DIAGNOSIS — R197 Diarrhea, unspecified: Secondary | ICD-10-CM | POA: Diagnosis not present

## 2023-07-20 DIAGNOSIS — Z5112 Encounter for antineoplastic immunotherapy: Secondary | ICD-10-CM | POA: Diagnosis not present

## 2023-07-20 DIAGNOSIS — I129 Hypertensive chronic kidney disease with stage 1 through stage 4 chronic kidney disease, or unspecified chronic kidney disease: Secondary | ICD-10-CM | POA: Diagnosis not present

## 2023-07-20 DIAGNOSIS — Z5111 Encounter for antineoplastic chemotherapy: Secondary | ICD-10-CM | POA: Diagnosis not present

## 2023-07-20 DIAGNOSIS — M899 Disorder of bone, unspecified: Secondary | ICD-10-CM | POA: Diagnosis not present

## 2023-07-20 DIAGNOSIS — C7951 Secondary malignant neoplasm of bone: Secondary | ICD-10-CM | POA: Diagnosis not present

## 2023-07-20 DIAGNOSIS — C778 Secondary and unspecified malignant neoplasm of lymph nodes of multiple regions: Secondary | ICD-10-CM | POA: Diagnosis not present

## 2023-07-20 DIAGNOSIS — C3412 Malignant neoplasm of upper lobe, left bronchus or lung: Secondary | ICD-10-CM | POA: Diagnosis not present

## 2023-07-20 LAB — RAD ONC ARIA SESSION SUMMARY
Course Elapsed Days: 10
Plan Fractions Treated to Date: 9
Plan Prescribed Dose Per Fraction: 3 Gy
Plan Total Fractions Prescribed: 10
Plan Total Prescribed Dose: 30 Gy
Reference Point Dosage Given to Date: 27 Gy
Reference Point Session Dosage Given: 3 Gy
Session Number: 9

## 2023-07-21 ENCOUNTER — Other Ambulatory Visit: Payer: Self-pay

## 2023-07-21 ENCOUNTER — Ambulatory Visit
Admission: RE | Admit: 2023-07-21 | Discharge: 2023-07-21 | Disposition: A | Discharge status: Home / Self Care | Payer: Medicare HMO | Source: Ambulatory Visit | Attending: Radiation Oncology | Admitting: Radiation Oncology

## 2023-07-21 DIAGNOSIS — C7951 Secondary malignant neoplasm of bone: Secondary | ICD-10-CM | POA: Diagnosis not present

## 2023-07-21 DIAGNOSIS — Z510 Encounter for antineoplastic radiation therapy: Secondary | ICD-10-CM | POA: Diagnosis not present

## 2023-07-21 DIAGNOSIS — C778 Secondary and unspecified malignant neoplasm of lymph nodes of multiple regions: Secondary | ICD-10-CM | POA: Diagnosis not present

## 2023-07-21 DIAGNOSIS — M899 Disorder of bone, unspecified: Secondary | ICD-10-CM | POA: Diagnosis not present

## 2023-07-21 DIAGNOSIS — I129 Hypertensive chronic kidney disease with stage 1 through stage 4 chronic kidney disease, or unspecified chronic kidney disease: Secondary | ICD-10-CM | POA: Diagnosis not present

## 2023-07-21 DIAGNOSIS — R197 Diarrhea, unspecified: Secondary | ICD-10-CM | POA: Diagnosis not present

## 2023-07-21 DIAGNOSIS — Z5112 Encounter for antineoplastic immunotherapy: Secondary | ICD-10-CM | POA: Diagnosis not present

## 2023-07-21 DIAGNOSIS — C3412 Malignant neoplasm of upper lobe, left bronchus or lung: Secondary | ICD-10-CM | POA: Diagnosis not present

## 2023-07-21 DIAGNOSIS — Z5111 Encounter for antineoplastic chemotherapy: Secondary | ICD-10-CM | POA: Diagnosis not present

## 2023-07-21 DIAGNOSIS — Z87891 Personal history of nicotine dependence: Secondary | ICD-10-CM | POA: Diagnosis not present

## 2023-07-21 LAB — RAD ONC ARIA SESSION SUMMARY
Course Elapsed Days: 11
Plan Fractions Treated to Date: 10
Plan Prescribed Dose Per Fraction: 3 Gy
Plan Total Fractions Prescribed: 10
Plan Total Prescribed Dose: 30 Gy
Reference Point Dosage Given to Date: 30 Gy
Reference Point Session Dosage Given: 3 Gy
Session Number: 10

## 2023-07-21 MED FILL — Fosaprepitant Dimeglumine For IV Infusion 150 MG (Base Eq): INTRAVENOUS | Qty: 5 | Status: AC

## 2023-07-24 ENCOUNTER — Inpatient Hospital Stay: Payer: Medicare HMO

## 2023-07-24 ENCOUNTER — Other Ambulatory Visit: Payer: Self-pay

## 2023-07-24 ENCOUNTER — Encounter (HOSPITAL_COMMUNITY): Payer: Self-pay | Admitting: Orthopedic Surgery

## 2023-07-24 ENCOUNTER — Inpatient Hospital Stay: Payer: Medicare HMO | Admitting: Internal Medicine

## 2023-07-24 VITALS — BP 134/79 | HR 107 | Temp 98.1°F | Resp 18 | Wt 153.1 lb

## 2023-07-24 DIAGNOSIS — E1165 Type 2 diabetes mellitus with hyperglycemia: Secondary | ICD-10-CM | POA: Diagnosis present

## 2023-07-24 DIAGNOSIS — M84352A Stress fracture, left femur, initial encounter for fracture: Secondary | ICD-10-CM | POA: Diagnosis not present

## 2023-07-24 DIAGNOSIS — Z794 Long term (current) use of insulin: Secondary | ICD-10-CM | POA: Diagnosis not present

## 2023-07-24 DIAGNOSIS — I129 Hypertensive chronic kidney disease with stage 1 through stage 4 chronic kidney disease, or unspecified chronic kidney disease: Secondary | ICD-10-CM | POA: Diagnosis not present

## 2023-07-24 DIAGNOSIS — Z79899 Other long term (current) drug therapy: Secondary | ICD-10-CM | POA: Diagnosis not present

## 2023-07-24 DIAGNOSIS — E1122 Type 2 diabetes mellitus with diabetic chronic kidney disease: Secondary | ICD-10-CM | POA: Diagnosis not present

## 2023-07-24 DIAGNOSIS — C7951 Secondary malignant neoplasm of bone: Secondary | ICD-10-CM | POA: Diagnosis present

## 2023-07-24 DIAGNOSIS — C3490 Malignant neoplasm of unspecified part of unspecified bronchus or lung: Secondary | ICD-10-CM | POA: Diagnosis present

## 2023-07-24 DIAGNOSIS — Z7982 Long term (current) use of aspirin: Secondary | ICD-10-CM | POA: Diagnosis not present

## 2023-07-24 DIAGNOSIS — Z4789 Encounter for other orthopedic aftercare: Secondary | ICD-10-CM | POA: Diagnosis not present

## 2023-07-24 DIAGNOSIS — Z833 Family history of diabetes mellitus: Secondary | ICD-10-CM | POA: Diagnosis not present

## 2023-07-24 DIAGNOSIS — N183 Chronic kidney disease, stage 3 unspecified: Secondary | ICD-10-CM | POA: Diagnosis not present

## 2023-07-24 DIAGNOSIS — M25552 Pain in left hip: Secondary | ICD-10-CM | POA: Diagnosis present

## 2023-07-24 DIAGNOSIS — M84452A Pathological fracture, left femur, initial encounter for fracture: Secondary | ICD-10-CM | POA: Diagnosis present

## 2023-07-24 DIAGNOSIS — Z7984 Long term (current) use of oral hypoglycemic drugs: Secondary | ICD-10-CM | POA: Diagnosis not present

## 2023-07-24 DIAGNOSIS — Z95828 Presence of other vascular implants and grafts: Secondary | ICD-10-CM

## 2023-07-24 DIAGNOSIS — Z9889 Other specified postprocedural states: Secondary | ICD-10-CM | POA: Diagnosis not present

## 2023-07-24 DIAGNOSIS — E119 Type 2 diabetes mellitus without complications: Secondary | ICD-10-CM | POA: Diagnosis not present

## 2023-07-24 DIAGNOSIS — M84459A Pathological fracture, hip, unspecified, initial encounter for fracture: Secondary | ICD-10-CM | POA: Diagnosis not present

## 2023-07-24 DIAGNOSIS — Z87891 Personal history of nicotine dependence: Secondary | ICD-10-CM | POA: Diagnosis not present

## 2023-07-24 DIAGNOSIS — I10 Essential (primary) hypertension: Secondary | ICD-10-CM | POA: Diagnosis present

## 2023-07-24 LAB — CMP (CANCER CENTER ONLY)
ALT: 14 U/L (ref 0–44)
AST: 12 U/L — ABNORMAL LOW (ref 15–41)
Albumin: 3.8 g/dL (ref 3.5–5.0)
Alkaline Phosphatase: 190 U/L — ABNORMAL HIGH (ref 38–126)
Anion gap: 6 (ref 5–15)
BUN: 22 mg/dL (ref 8–23)
CO2: 28 mmol/L (ref 22–32)
Calcium: 9.8 mg/dL (ref 8.9–10.3)
Chloride: 107 mmol/L (ref 98–111)
Creatinine: 1.43 mg/dL — ABNORMAL HIGH (ref 0.61–1.24)
GFR, Estimated: 52 mL/min — ABNORMAL LOW (ref >60)
Glucose, Bld: 293 mg/dL — ABNORMAL HIGH (ref 70–99)
Potassium: 4.6 mmol/L (ref 3.5–5.1)
Sodium: 141 mmol/L (ref 135–145)
Total Bilirubin: 0.4 mg/dL (ref 0.0–1.2)
Total Protein: 6.4 g/dL — ABNORMAL LOW (ref 6.5–8.1)

## 2023-07-24 LAB — CBC WITH DIFFERENTIAL (CANCER CENTER ONLY)
Abs Immature Granulocytes: 0.1 10*3/uL — ABNORMAL HIGH (ref 0.00–0.07)
Basophils Absolute: 0 10*3/uL (ref 0.0–0.1)
Basophils Relative: 0 %
Eosinophils Absolute: 0.7 10*3/uL — ABNORMAL HIGH (ref 0.0–0.5)
Eosinophils Relative: 6 %
HCT: 26.4 % — ABNORMAL LOW (ref 39.0–52.0)
Hemoglobin: 8.5 g/dL — ABNORMAL LOW (ref 13.0–17.0)
Immature Granulocytes: 1 %
Lymphocytes Relative: 8 %
Lymphs Abs: 0.8 10*3/uL (ref 0.7–4.0)
MCH: 31 pg (ref 26.0–34.0)
MCHC: 32.2 g/dL (ref 30.0–36.0)
MCV: 96.4 fL (ref 80.0–100.0)
Monocytes Absolute: 0.6 10*3/uL (ref 0.1–1.0)
Monocytes Relative: 6 %
Neutro Abs: 8.4 10*3/uL — ABNORMAL HIGH (ref 1.7–7.7)
Neutrophils Relative %: 79 %
Platelet Count: 176 10*3/uL (ref 150–400)
RBC: 2.74 MIL/uL — ABNORMAL LOW (ref 4.22–5.81)
RDW: 18.6 % — ABNORMAL HIGH (ref 11.5–15.5)
WBC Count: 10.6 10*3/uL — ABNORMAL HIGH (ref 4.0–10.5)
nRBC: 0 % (ref 0.0–0.2)

## 2023-07-24 MED ORDER — OXYCODONE-ACETAMINOPHEN 5-325 MG PO TABS
1.0000 | ORAL_TABLET | Freq: Three times a day (TID) | ORAL | 0 refills | Status: DC | PRN
Start: 2023-07-24 — End: 2023-07-26

## 2023-07-24 MED ORDER — SODIUM CHLORIDE 0.9% FLUSH
10.0000 mL | Freq: Once | INTRAVENOUS | Status: AC
  Administered 2023-07-24: 10 mL

## 2023-07-24 MED ORDER — HEPARIN SOD (PORK) LOCK FLUSH 100 UNIT/ML IV SOLN
500.0000 [IU] | Freq: Once | INTRAVENOUS | Status: AC
  Administered 2023-07-24: 500 [IU]

## 2023-07-24 NOTE — Radiation Completion Notes (Addendum)
  Radiation Oncology         [phone] ________________________________  Name: Jonathan Page MRN: [id number]  Date: 07/21/2023  DOB: Aug 12, 1950  Referring Physician: KATHERINE CLARK, M.D. Date of Service: 2023-07-24 Radiation Oncologist: Adina Barge, M.D. [facility]     RADIATION ONCOLOGY END OF TREATMENT NOTE     Diagnosis:  72 y/o man with painful osseous metastasis with epidural extension at L4-L5, secondary to newly diagnosed Stage IV NSCLC, adenocarcinoma.   Intent: Palliative     ==========DELIVERED PLANS==========  First Treatment Date: 2023-07-10 Last Treatment Date: 2023-07-21   Plan Name: Spine_L4-L5 Site: Lumbar Spine Technique: 3D Mode: Photon Dose Per Fraction: 3 Gy Prescribed Dose (Delivered / Prescribed): 30 Gy / 30 Gy Prescribed Fxs (Delivered / Prescribed): 10 / 10     ==========ON TREATMENT VISIT DATES========== 2023-07-14, 2023-07-21   See weekly On Treatment Notes in Epic for details in the Media tab (listed as Progress notes on the On Treatment Visit Dates listed above).  He tolerated the treatments relatively well without any modest fatigue.  The patient will receive a call in about one month from the radiation oncology department. He will continue follow up with his medical oncologist, Dr. Sherrod as well.  ------------------------------------------------   Donnice Barge, MD [facility]  Radiation Oncology Direct Dial: [phone]  Fax: [phone] [email or web address]  Skype  [email or web address]

## 2023-07-24 NOTE — Addendum Note (Signed)
 Addended by: Bonnie Butters on: 07/24/2023 08:49 AM   Modules accepted: Orders

## 2023-07-24 NOTE — Anesthesia Preprocedure Evaluation (Addendum)
Anesthesia Evaluation  Patient identified by MRN, date of birth, ID band Patient awake    Reviewed: Allergy & Precautions, H&P , NPO status , Patient's Chart, lab work & pertinent test results  History of Anesthesia Complications Negative for: history of anesthetic complications  Airway Mallampati: III  TM Distance: >3 FB Neck ROM: Full    Dental  (+) Dental Advisory Given, Edentulous Upper,    Pulmonary neg shortness of breath, neg sleep apnea, neg COPD, neg recent URI, former smoker Lung mass   Pulmonary exam normal breath sounds clear to auscultation       Cardiovascular hypertension, Pt. on medications (-) angina (-) Past MI, (-) Cardiac Stents and (-) CABG  Rhythm:Regular Rate:Normal  HLD  TTE 10/15/2016: Study Conclusions   - Left ventricle: The cavity size was normal. Wall thickness was    normal. Systolic function was normal. The estimated ejection    fraction was in the range of 60% to 65%. Wall motion was normal;    there were no regional wall motion abnormalities. Doppler    parameters are consistent with abnormal left ventricular    relaxation (grade 1 diastolic dysfunction).  - Aortic valve: Valve area (VTI): 3.02 cm^2. Valve area (Vmax):    3.15 cm^2. Valve area (Vmean): 3.23 cm^2.  - Aorta: The visualized portion of the ascending aorta is mildly    dilated at 3.9 cm.  - Mitral valve: There was mild regurgitation.  - Atrial septum: No defect or patent foramen ovale was identified.      Neuro/Psych  Headaches, neg Seizures Concussion syndrome  negative psych ROS   GI/Hepatic negative GI ROS, Neg liver ROS,neg GERD  ,,(+) neg Cirrhosis        Endo/Other  diabetes, Poorly Controlled, Type 2, Oral Hypoglycemic Agents, Insulin Dependent    Renal/GU CRFRenal disease  negative genitourinary   Musculoskeletal negative musculoskeletal ROS (+)    Abdominal   Peds negative pediatric ROS (+)   Hematology negative hematology ROS (+)   Anesthesia Other Findings   Reproductive/Obstetrics negative OB ROS                              Anesthesia Physical Anesthesia Plan  ASA: 4  Anesthesia Plan: General   Post-op Pain Management: Tylenol PO (pre-op)*   Induction: Intravenous  PONV Risk Score and Plan: 2 and Ondansetron, Dexamethasone and Treatment may vary due to age or medical condition  Airway Management Planned: Oral ETT  Additional Equipment:   Intra-op Plan:   Post-operative Plan: Extubation in OR  Informed Consent: I have reviewed the patients History and Physical, chart, labs and discussed the procedure including the risks, benefits and alternatives for the proposed anesthesia with the patient or authorized representative who has indicated his/her understanding and acceptance.     Dental advisory given  Plan Discussed with: CRNA and Anesthesiologist  Anesthesia Plan Comments: (Reviewed 12/26 prior to lung biopsy. No anesthesia complications. Pt is s/p radiation on 2/7. He is receiving chemo but it will be delayed by two weeks for post-surgical recovery per Oncology.)        Anesthesia Quick Evaluation

## 2023-07-24 NOTE — Progress Notes (Signed)
 [name] [phone]   [phone]  OFFICE PROGRESS NOTE  Jonathan John, NP [address] [name] [place] [address]  DIAGNOSIS: Stage IV (T3, N2, M1 C) non-small cell lung cancer, adenocarcinoma presented with large left upper lobe lung mass with left hilar and mediastinal invasion in addition to thoracic inlet lymphadenopathy as well as several metastatic bone lesions involving the spine, ribs as well as the right iliac bone diagnosed in December 2024.   Molecular studies by [id number] showed no actionable mutations and PD-L1 expression was negative.   PRIOR THERAPY: None   CURRENT THERAPY: 1) Palliative systemic chemotherapy and immunotherapy with carboplatin  for an AUC of 5, Taxol  175 mg/m, and Libtayo  IV every 3 weeks with Neulasta  support.  (Not a candidate for Alimta due to CKD).  First dose on 07/03/23.  Status post 1 cycle.  2) radiation to L4 and L5 to the care of Dr. Lorri Rota. Final treatment expected on 07/21/23  INTERVAL HISTORY: Jonathan Page 73 y.o. male returns to the clinic today for follow-up visit accompanied by his wife.Discussed the use of AI scribe software for clinical note transcription with the patient, who gave verbal consent to proceed.  History of Present Illness   Jonathan Page is a 73 year old male with adenocarcinoma who presents for chemotherapy and radiation treatment follow-up.  He has a history of adenocarcinoma diagnosed in December 2024 and is currently receiving chemotherapy with carboplatin , paclitaxel , and pembrolizumab (Keytruda). Additionally, he has been undergoing radiation therapy targeting bone lesions at L4, L5, and the hip. He feels good today with no new complaints since the last visit, except for a need for a refill of OxyContin , which he was unable to obtain over the weekend.  He has not experienced significant weight loss, which is important for his recovery from the upcoming surgery.  He  experiences some shortness of breath, which may be related to his anemia. The anemia is attributed to chronic kidney disease and the recent chemotherapy. No nausea or vomiting is present.  He has had diarrhea for about two days after taking a laxative. His appetite is reportedly good, and he is eating well.       MEDICAL HISTORY: Past Medical History:  Diagnosis Date   Allergy    Cancer (HCC)    Cellulitis 02/21/2018   Concussion syndrome    Diabetes (HCC)    Frequent headaches    Hypertension     ALLERGIES:  has no known allergies.  MEDICATIONS:  Current Outpatient Medications  Medication Sig Dispense Refill   acetaminophen  (TYLENOL ) 500 MG tablet Take 500-1,000 mg by mouth every 6 (six) hours as needed (pain).     aspirin  EC 81 MG tablet Take 81 mg by mouth daily.     Blood Glucose Monitoring Suppl (ACCU-CHEK AVIVA PLUS) w/Device KIT Check blood sugar before breakfast, lunch and bedtime and as directed. Dx E11.65 1 kit 0   empagliflozin  (JARDIANCE ) 10 MG TABS tablet Take 1 tablet (10 mg total) by mouth daily before breakfast. 30 tablet 3   folic acid  (FOLVITE ) 1 MG tablet Take 1 mg by mouth in the morning.     gabapentin  (NEURONTIN ) 300 MG capsule Take 1 capsule (300 mg total) by mouth at bedtime. (Patient taking differently: Take 300 mg by mouth in the morning.) 30 capsule 1   glipiZIDE  (GLUCOTROL  XL) 10 MG 24 hr tablet Take 1 tablet (10 mg total) by mouth daily with  breakfast. for diabetes. 90 tablet 3   glucose blood (ACCU-CHEK AVIVA PLUS) test strip Check blood sugar before breakfast, lunch and bedtime and as directed. Dx E11.65 300 each 1   insulin  NPH-regular Human (70-30) 100 UNIT/ML injection Inject 5 Units into the skin daily with breakfast.     Lancets (ACCU-CHEK SOFT TOUCH) lancets Check blood sugar before breakfast, lunch and bedtime and as directed. Dx E11.65 300 each 1   lidocaine -prilocaine  (EMLA ) cream Apply 1 Application topically as needed. 30 g 1   lisinopril   (ZESTRIL ) 10 MG tablet Take 1 tablet (10 mg total) by mouth daily. for blood pressure. 90 tablet 0   ondansetron  (ZOFRAN ) 8 MG tablet Take 8 mg by mouth every 8 (eight) hours as needed for nausea or vomiting.     oxyCODONE -acetaminophen  (PERCOCET/ROXICET) 5-325 MG tablet Take 1 tablet by mouth every 8 (eight) hours as needed for severe pain (pain score 7-10). 30 tablet 0   prochlorperazine  (COMPAZINE ) 10 MG tablet Take 1 tablet (10 mg total) by mouth every 6 (six) hours as needed for nausea or vomiting. 30 tablet 0   No current facility-administered medications for this visit.    SURGICAL HISTORY:  Past Surgical History:  Procedure Laterality Date   APPENDECTOMY  1960   BRONCHIAL BIOPSY  06/12/2023   Procedure: BRONCHIAL BIOPSIES;  Surgeon: Prudy Brownie, DO;  Location: [facility];  Service: Cardiopulmonary;;   BRONCHIAL BRUSHINGS  06/12/2023   Procedure: BRONCHIAL BRUSHINGS;  Surgeon: Prudy Brownie, DO;  Location: [facility];  Service: Cardiopulmonary;;   COLONOSCOPY     IR IMAGING GUIDED PORT INSERTION  07/13/2023   VIDEO BRONCHOSCOPY Bilateral 06/12/2023   Procedure: VIDEO BRONCHOSCOPY;  Surgeon: Prudy Brownie, DO;  Location: [facility];  Service: Cardiopulmonary;  Laterality: Bilateral;   WISDOM TOOTH EXTRACTION      REVIEW OF SYSTEMS:  Constitutional: positive for fatigue Eyes: negative Ears, nose, mouth, throat, and face: negative Respiratory: negative Cardiovascular: negative Gastrointestinal: positive for diarrhea Genitourinary:negative Integument/breast: negative Hematologic/lymphatic: negative Musculoskeletal:positive for bone pain Neurological: negative Behavioral/Psych: negative Endocrine: negative Allergic/Immunologic: negative   PHYSICAL EXAMINATION: General appearance: alert, cooperative, fatigued, and no distress Head: Normocephalic, without obvious abnormality, atraumatic Neck: no adenopathy, no JVD, supple, symmetrical, trachea midline, and  thyroid  not enlarged, symmetric, no tenderness/mass/nodules Lymph nodes: Cervical, supraclavicular, and axillary nodes normal. Resp: normal percussion bilaterally Back: symmetric, no curvature. ROM normal. No CVA tenderness. Cardio: regular rate and rhythm, S1, S2 normal, no murmur, click, rub or gallop GI: soft, non-tender; bowel sounds normal; no masses,  no organomegaly Extremities: extremities normal, atraumatic, no cyanosis or edema Neurologic: Alert and oriented X 3, normal strength and tone. Normal symmetric reflexes. Normal coordination and gait  ECOG PERFORMANCE STATUS: 1 - Symptomatic but completely ambulatory  Blood pressure 134/79, pulse (!) 107, temperature 98.1 F (36.7 C), temperature source Temporal, resp. rate 18, weight 153 lb 1.6 oz (69.4 kg), SpO2 98%.  LABORATORY DATA: Lab Results  Component Value Date   WBC 10.6 (H) 07/24/2023   HGB 8.5 (L) 07/24/2023   HCT 26.4 (L) 07/24/2023   MCV 96.4 07/24/2023   PLT 176 07/24/2023      Chemistry      Component Value Date/Time   NA 139 07/17/2023 1500   K 4.2 07/17/2023 1500   CL 106 07/17/2023 1500   CO2 27 07/17/2023 1500   BUN 19 07/17/2023 1500   CREATININE 1.32 (H) 07/17/2023 1500      Component Value Date/Time   CALCIUM   9.8 07/17/2023 1500   ALKPHOS 215 (H) 07/17/2023 1500   AST 30 07/17/2023 1500   ALT 29 07/17/2023 1500   BILITOT 0.4 07/17/2023 1500       RADIOGRAPHIC STUDIES: XR FEMUR, MIN 2 VIEWS RIGHT Result Date: 07/19/2023 AP lateral radiographs right femur reviewed.  No visible lesions in the proximal femur.  No acute fracture.  XR FEMUR MIN 2 VIEWS LEFT Result Date: 07/19/2023 AP lateral radiographs left femur reviewed.  Osteolytic lesion in the proximal femur is noted.  This extends to the superior femoral neck.  Definite greater trochanteric involvement is noted.  No subtrochanteric involvement on plain radiographs.  There is some lytic component to the distal medial femoral condyle which is  very subtle.  IR IMAGING GUIDED PORT INSERTION Result Date: 07/13/2023 INDICATION: Port-A-Cath placement.  History of metastatic lung cancer EXAM: IMPLANTED PORT A CATH PLACEMENT WITH ULTRASOUND AND FLUOROSCOPIC GUIDANCE MEDICATIONS: None; ANESTHESIA/SEDATION: Moderate (conscious) sedation was employed during this procedure. A total of Versed  1.5 mg and Fentanyl  75 mcg was administered intravenously. Moderate Sedation Time: 20 minutes. The patient's level of consciousness and vital signs were monitored continuously by radiology nursing throughout the procedure under my direct supervision. FLUOROSCOPY TIME:  Fluoroscopic dose; 0 mGy COMPLICATIONS: None immediate. PROCEDURE: The procedure, risks, benefits, and alternatives were explained to the patient. Questions regarding the procedure were encouraged and answered. The patient understands and consents to the procedure. The RIGHT neck and chest were prepped with chlorhexidine  in a sterile fashion, and a sterile drape was applied covering the operative field. Maximum barrier sterile technique with sterile gowns and gloves were used for the procedure. A timeout was performed prior to the initiation of the procedure. Local anesthesia was provided with 1% lidocaine  with epinephrine . After creating a small venotomy incision, a micropuncture kit was utilized to access the internal jugular vein under direct, real-time ultrasound guidance. Ultrasound image documentation was performed. The microwire was kinked to measure appropriate catheter length. A subcutaneous port pocket was then created along the upper chest wall utilizing a combination of sharp and blunt dissection. The pocket was irrigated with sterile saline. A single lumen power injectable port was chosen for placement. The 8 Fr catheter was tunneled from the port pocket site to the venotomy incision. The port was placed in the pocket. The external catheter was trimmed to appropriate length. At the venotomy, an 8  Fr peel-away sheath was placed over a guidewire under fluoroscopic guidance. The catheter was then placed through the sheath and the sheath was removed. Final catheter positioning was confirmed and documented with a fluoroscopic spot radiograph. The port was accessed with a Huber needle, aspirated and flushed with heparinized saline. The port pocket incision was closed with interrupted 3-0 Vicryl suture then Dermabond was applied, including at the venotomy incision. Dressings were placed. The patient tolerated the procedure well without immediate post procedural complication. IMPRESSION: Successful placement of a RIGHT internal jugular approach power injectable Port-A-Cath. The tip of the catheter is positioned within the proximal RIGHT atrium. The catheter is ready for immediate use. Art Largo, MD [facility] Electronically Signed   By: Art Largo M.D.   On: 07/13/2023 17:21   MR BRAIN W WO CONTRAST Result Date: 07/11/2023 CLINICAL DATA:  Non-small cell lung cancer, staging EXAM: MRI HEAD WITHOUT AND WITH CONTRAST TECHNIQUE: Multiplanar, multiecho pulse sequences of the brain and surrounding structures were obtained without and with intravenous contrast. CONTRAST:  6mL GADAVIST  GADOBUTROL  1 MMOL/ML IV  SOLN COMPARISON:  10/17/2014 MRI head FINDINGS: Brain: No restricted diffusion to suggest acute or subacute infarct. No abnormal parenchymal or meningeal enhancement. No acute hemorrhage, mass, mass effect, or midline shift. No hydrocephalus or extra-axial collection. Partial empty sella. Craniocervical junction within normal limits. No hemosiderin deposition to suggest remote hemorrhage. Normal cerebral volume for age. Scattered and confluent T2 hyperintense signal in the periventricular white matter, likely the sequela of mild-to-moderate chronic small vessel ischemic disease. Mildly advanced cerebral volume loss for age, without disproportionate  lobar atrophy. Vascular: Normal arterial flow voids. Normal arterial and venous enhancement. Skull and upper cervical spine: Normal marrow signal. Sinuses/Orbits: Clear paranasal sinuses. No acute finding in the orbits. Other: The mastoid air cells are well aerated. IMPRESSION: No acute intracranial process. No evidence of intracranial metastatic disease. Electronically Signed   By: Zoila Hines M.D.   On: 07/11/2023 21:16   NM PET Image Initial (PI) Skull Base To Thigh Result Date: 07/11/2023 CLINICAL DATA:  Initial treatment strategy for lung mass. EXAM: NUCLEAR MEDICINE PET SKULL BASE TO THIGH TECHNIQUE: 8.45 mCi F-18 FDG was injected intravenously. Full-ring PET imaging was performed from the skull base to thigh after the radiotracer. CT data was obtained and used for attenuation correction and anatomic localization. Fasting blood glucose: 152 mg/dl COMPARISON:  CT chest, abdomen and pelvis from 05/29/2023 FINDINGS: Mediastinal blood pool activity: SUV max 2.24 Liver activity: SUV max NA NECK: No hypermetabolic lymph nodes in the neck. Incidental CT findings: Asymmetric increased tracer uptake identified within the right vocal cord. This may reflect sequelae of the AP window mass noted in the chest with resultant mass effect upon the recurrent laryngeal nerve. Clinical correlation advised. CHEST: Within the paramediastinal left upper lobe there is a solid FDG avid mass which measures 2.7 cm and has an SUV max of 3.45, image 50/6. On the previous CT this measured 2.9 cm. There is adjacent ill-defined soft tissue infiltration within the left pre-vascular, AP window, and left paratracheal region with SUV max of 2.39. Progressive peribronchovascular infiltrative soft tissue and nodularity is identified within the left upper lobe, image 56/6. This is concerning for peribronchovascular spread of tumor with SUV max of 2.42, image 56 of series 6. Soft tissue thickening and nodularity along the oblique fissure of the  left lung is also noted concerning for pleural involvement by tumor, image 56/6. This is new from previous exam. Small left pleural effusion is identified which appears partially loculated over the posterior left upper lobe, also new from prior exam. Cannot exclude malignant pleural effusion. Signs of nodal metastasis within the chest noted including: Nodularity within the prevascular anterior mediastinum, anterior to the SVC measures 1.6 cm with SUV max of 2.0, image 48/6. Previously this measured 0.6 cm. -Enlarged right internal mammary lymph node measures 1.2 cm with SUV max of 1.85, image 52/6. New from previous exam. -Tracer avid left supraclavicular lymph nodes are identified. Index node measures 0.7 cm with SUV max of 2.32. Previously this measured the same. Multiple scattered lung nodules are identified throughout the remaining portions of the lungs concerning for metastatic disease. Index lesions include: -Index nodule within the subpleural right upper lobe measures 8 mm with SUV max of 1.12, image 45/6. Previously this measured 5 mm and appeared less solid. -Index nodule within the lingula measures 1.2 cm with SUV max 1.80. Previously this measured 0.3 cm. Incidental CT findings: Emphysema. Mild aortic atherosclerotic calcifications. ABDOMEN/PELVIS: No abnormal tracer avid lesions above background liver activity identified. No abnormal tracer  uptake identified within the pancreas or adrenal glands. No tracer avid abdominopelvic lymph nodes. Incidental CT findings: Aortic atherosclerosis. Innumerable bilateral renal cysts are identified. SKELETON: Widespread tracer avid lytic bone metastases are identified involving the axial and proximal appendicular skeleton. Index lesions include: -Large lytic lesion with areas of cortical fracture involving the left femoral neck with SUV max of 4.17, image 142/6. Although not completely fractured at this time I am concerned for an impending pathologic fracture. -More  distally within the proximal left femur is a permeative tracer avid lesion with surrounding soft tissue component with SUV max of 4.23, image 158/6. -Tumor involving the L4-5 level is identified with soft tissue extension into the surrounding paraspinal soft tissues. -Underlying permeative appearance of the L4 and L5 vertebra noted. Cannot exclude posterior extension of tumor into the lower lumbar canal at this level. -Permeative lesion involving the posterior aspect of the left third rib has an SUV max of 3.64. Incidental CT findings: None. IMPRESSION: 1. There is a 2.7 cm solid FDG avid mass within the paramediastinal left upper lobe compatible with primary bronchogenic carcinoma. There is adjacent ill-defined soft tissue infiltration within the left pre-vascular, AP window, and left paratracheal region compatible with nodal metastasis. 2. Progressive peribronchovascular infiltrative soft tissue and nodularity is identified within the left upper lobe concerning for peribronchovascular spread of tumor. 3. Soft tissue thickening and nodularity along the oblique fissure of the left lung is also noted concerning for pleural involvement by tumor. New from previous exam. 4. New left pleural effusion is identified which appears partially loculated over the posterior left upper lobe. Cannot exclude malignant pleural effusion. 5. Multiple scattered lung nodules are identified throughout the remaining portions of the lungs concerning for metastatic disease. Index nodules are increased in size. 6. Widespread tracer avid lytic bone metastases are identified involving the axial and proximal appendicular skeleton. 7. Large lytic lesion with areas of cortical fracture involving the left femoral neck. Although not completely fractured at this time I am concerned for an impending pathologic fracture. 8. Asymmetric increased tracer uptake identified within the right vocal cord. This may reflect sequelae of the AP window mass noted  in the chest with resultant mass effect upon the recurrent laryngeal nerve. 9. Aortic Atherosclerosis (ICD10-I70.0) and Emphysema (ICD10-J43.9). Electronically Signed   By: Kimberley Penman M.D.   On: 07/11/2023 17:37    ASSESSMENT AND PLAN: This is a very pleasant 73 years old African-American male with Stage IV (T3, N2, M1 C) non-small cell lung cancer, adenocarcinoma presented with large left upper lobe lung mass with left hilar and mediastinal invasion in addition to thoracic inlet lymphadenopathy as well as several metastatic bone lesions involving the spine, ribs as well as the right iliac bone diagnosed in December 2024. He completed a course of palliative radiotherapy to the L4-5 under the care of Dr. Lorri Rota. The patient is currently undergoing systemic chemotherapy with carboplatin  for AUC of 5, paclitaxel  175 Mg/M2 and Keytruda 200 Mg IV every 3 weeks status post 1 cycle.  He has not a candidate for treatment with pemetrexed because of his renal insufficiency. He tolerated the first cycle of his treatment fairly well.  He is scheduled tomorrow for intramedullary hip screw fixation for impending left proximal femur pathologic fracture.    Stage IV (T3, N2, M1 C) non-small cell lung cancer, adenocarcinoma presented with large left upper lobe lung mass with left hilar and mediastinal invasion in addition to thoracic inlet lymphadenopathy as well as several metastatic bone  lesions involving the spine, ribs as well as the right iliac bone diagnosed in December 2024. Diagnosed in December 2024. Currently on carboplatin , paclitaxel , and pembrolizumab. Undergoing radiation therapy to L4, L5, and hip. Orthopedic surgery scheduled for left hip and knee tomorrow. Chemotherapy delayed by two weeks for post-surgical recovery. Emphasized importance of maintaining weight for healing. - Delay chemotherapy by two weeks - Proceed with orthopedic surgery on left femur tomorrow - Continue radiation therapy as  scheduled - Schedule follow-up in two weeks  Pain Management Requires OxyContin  refill. Pain management crucial post-surgery. Referred to palliative care for ongoing pain management and quality of life improvement. - Refill OxyContin  prescription - Refer to palliative care team for pain management  Anemia Likely secondary to chronic kidney disease and recent chemotherapy, contributing to dyspnea and fatigue. Discussed impact on symptoms and need for monitoring. - Monitor anemia and manage symptoms  Diarrhea Experienced diarrhea for two days, likely due to laxative use. Symptoms resolved. Discussed monitoring for recurrence and symptom management. - Monitor for recurrence of diarrhea  General Health Maintenance Good appetite and nutrition. Emphasized importance of maintaining weight for post-surgical recovery. - Encourage maintaining good nutrition to support healing  Follow-up - Schedule follow-up appointment in two weeks.   The patient was advised to call immediately if he has any other concerning symptoms in the interval. The patient voices understanding of current disease status and treatment options and is in agreement with the current care plan.  All questions were answered. The patient knows to call the clinic with any problems, questions or concerns. We can certainly see the patient much sooner if necessary.  The total time spent in the appointment was 30 minutes.  Disclaimer: This note was dictated with voice recognition software. Similar sounding words can inadvertently be transcribed and may not be corrected upon review.

## 2023-07-24 NOTE — Progress Notes (Deleted)
 Palliative Medicine [facility]  [phone] Fax:([phone]) [phone]   Name: Jonathan Page Date: 07/24/2023 MRN: [id number]  DOB: 1950-11-04  Patient Care Team: Doreene Nest, NP as PCP - General (Internal Medicine) Lytle Butte, RN as Oncology Nurse Navigator Pa, [facility] (Optometry) Pickenpack-Cousar, Arty Baumgartner, NP as Nurse Practitioner [facility] and Palliative Medicine)    INTERVAL HISTORY: Jonathan Page is a 73 y.o. male with oncologic medical history including non-small cell lung cancer (06/2023) with metastatic disease to spine and widespread bone. As well as a history of diabetes, hypertension, and seasonal allergies and a 45lb weight loss in the last 3-4 months. Palliative ask to see for symptom management and goals of care.   SOCIAL HISTORY:     reports that he quit smoking about 4 years ago. His smoking use included cigarettes and cigars. He started smoking about 34 years ago. He has a 15 pack-year smoking history. He has never used smokeless tobacco. He reports current alcohol use of about 6.0 standard drinks of alcohol per week. He reports that he does not use drugs.  ADVANCE DIRECTIVES:  None on file  CODE STATUS: Full code  PAST MEDICAL HISTORY: Past Medical History:  Diagnosis Date   Allergy    Cancer (HCC)    Cellulitis 02/21/2018   Concussion syndrome    Diabetes (HCC)    Frequent headaches    Hypertension     ALLERGIES:  has no known allergies.  MEDICATIONS:  Current Outpatient Medications  Medication Sig Dispense Refill   aspirin EC 81 MG tablet Take 81 mg by mouth daily.     Blood Glucose Monitoring Suppl (ACCU-CHEK AVIVA PLUS) w/Device KIT Check blood sugar before breakfast, lunch and bedtime and as directed. Dx E11.65 1 kit 0   empagliflozin (JARDIANCE) 10 MG TABS tablet Take 1 tablet (10 mg total) by mouth daily before breakfast. 30 tablet 3   folic acid (FOLVITE) 1 MG tablet Take 1 mg by mouth in the  morning.     gabapentin (NEURONTIN) 300 MG capsule Take 1 capsule (300 mg total) by mouth at bedtime. (Patient taking differently: Take 300 mg by mouth in the morning.) 30 capsule 1   glipiZIDE (GLUCOTROL XL) 10 MG 24 hr tablet Take 1 tablet (10 mg total) by mouth daily with breakfast. for diabetes. 90 tablet 3   glucose blood (ACCU-CHEK AVIVA PLUS) test strip Check blood sugar before breakfast, lunch and bedtime and as directed. Dx E11.65 300 each 1   insulin NPH-regular Human (70-30) 100 UNIT/ML injection Inject 5 Units into the skin daily with breakfast.     Lancets (ACCU-CHEK SOFT TOUCH) lancets Check blood sugar before breakfast, lunch and bedtime and as directed. Dx E11.65 300 each 1   lidocaine-prilocaine (EMLA) cream Apply 1 Application topically as needed. 30 g 1   lisinopril (ZESTRIL) 10 MG tablet Take 1 tablet (10 mg total) by mouth daily. for blood pressure. 90 tablet 0   ondansetron (ZOFRAN) 8 MG tablet Take 8 mg by mouth every 8 (eight) hours as needed for nausea or vomiting.     oxyCODONE-acetaminophen (PERCOCET/ROXICET) 5-325 MG tablet Take 1 tablet by mouth every 8 (eight) hours as needed for severe pain (pain score 7-10). 30 tablet 0   prochlorperazine (COMPAZINE) 10 MG tablet Take 1 tablet (10 mg total) by mouth every 6 (six) hours as needed for nausea or vomiting. 30 tablet 0   No current facility-administered medications for this visit.  VITAL SIGNS: There were no vitals taken for this visit. There were no vitals filed for this visit.  Estimated body mass index is 20.2 kg/m as calculated from the following:   Height as of 07/13/23: 6\' 1"  (1.854 m).   Weight as of 07/24/23: 153 lb 1.6 oz (69.4 kg).   PERFORMANCE STATUS (ECOG) : 1 - Symptomatic but completely ambulatory   Physical Exam General: NAD Cardiovascular: regular rate and rhythm Pulmonary: clear ant fields Abdomen: soft, nontender, + bowel sounds Extremities: no edema, no joint deformities Skin: no  rashes Neurological:   IMPRESSION: ***  We discussed Her current illness and what it means in the larger context of Her on-going co-morbidities. Natural disease trajectory and expectations were discussed.  I discussed the importance of continued conversation with family and their medical providers regarding overall plan of care and treatment options, ensuring decisions are within the context of the patients values and GOCs.  PLAN: Established therapeutic relationship. Education provided on palliative's role in collaboration with their Oncology/Radiation team.  Cancer Related Pain Management Patient reports taking Oxycodone and Gabapentin for pain management. No new complaints of pain. Pain controlled overall. Some days better than others.  -Continue current regimen of Oxycodone 5/325mg  every 8 hours  -Continue Gabapentin 300mg  at bedtime, can increase to twice daily if pain worsens.  -Contact office for refills as needed.  Constipation Patient reports constipation, managed with prune juice. -Continue current home remedy for constipation. -If prune juice is no longer effective will need to consider use of daily stool softeners.   Radiation Therapy Patient is currently undergoing radiation therapy. No new complaints related to therapy. -Continue current radiation therapy regimen.  General Health Maintenance -Continue to monitor symptoms and report any changes. -Follow-up appointments as scheduled. -I will plan to see patient back in 2-3 weeks for follow-up.  Patient expressed understanding and was in agreement with this plan. He also understands that He can call the clinic at any time with any questions, concerns, or complaints.   Any controlled substances utilized were prescribed in the context of palliative care. PDMP has been reviewed.    Visit consisted of counseling and education dealing with the complex and emotionally intense issues of symptom management and palliative care  in the setting of serious and potentially life-threatening illness.  Willette Alma, AGPCNP-BC  Palliative Medicine Team/ Cancer Center

## 2023-07-24 NOTE — Progress Notes (Addendum)
 PCP - [name], NP  Cardiologist -   PPM/ICD - denies Device Orders - n/a Rep Notified - n/a  Chest x-ray -  EKG - 04-20-23 Stress Test -  ECHO - 10-15-16 Cardiac Cath -   CPAP - denies   Fasting Blood Sugar - per patient around 100 Checks Blood Sugar BID  Blood Thinner Instructions: denies Aspirin  Instructions: per patient he no longer takes  ERAS Protcol - clear liquids until 1315  COVID TEST- n/a  Anesthesia review: yes Hx of DM, CKD, HTN Per patient Port a cath removed today  Patient verbally denies any shortness of breath, fever, cough and chest pain during phone call   -------------  SDW INSTRUCTIONS given:  Your procedure is scheduled on July 25, 2023.  Report to [facility]" at 1:45 P.M., and check in at the Admitting office.  Call this number if you have problems the morning of surgery:  [phone]   Remember:  Do not eat after midnight the night before your surgery  You may drink clear liquids until 1:15 the day of your surgery.   Clear liquids allowed are: Water, Non-Citrus Juices (without pulp), Carbonated Beverages, Clear Tea, Black Coffee Only, and Gatorade    Take these medicines the morning of surgery with A SIP OF WATER  gabapentin  (NEURONTIN )  ondansetron  (ZOFRAN )  oxyCODONE -acetaminophen  (PERCOCET/ROXICET)  prochlorperazine  (COMPAZINE )   WHAT DO I DO ABOUT MY DIABETES MEDICATION?   Do not take oral diabetes medicines the morning of surgery. empagliflozin  (JARDIANCE )  LAST DOSE,        glipiZIDE  (GLUCOTROL  XL)   THE NIGHT BEFORE SURGERY, take 0 units of insulin .       THE MORNING OF SURGERY, take 0 units of insulin .  The day of surgery, do not take other diabetes injectables, including Byetta (exenatide), Bydureon (exenatide ER), Victoza (liraglutide), or Trulicity (dulaglutide).  If your CBG is greater than 220 mg/dL, you may take  of your sliding scale (correction) dose of insulin .   HOW TO MANAGE  YOUR DIABETES BEFORE AND AFTER SURGERY  Why is it important to control my blood sugar before and after surgery? Improving blood sugar levels before and after surgery helps healing and can limit problems. A way of improving blood sugar control is eating a healthy diet by:  Eating less sugar and carbohydrates  Increasing activity/exercise  Talking with your doctor about reaching your blood sugar goals High blood sugars (greater than 180 mg/dL) can raise your risk of infections and slow your recovery, so you will need to focus on controlling your diabetes during the weeks before surgery. Make sure that the doctor who takes care of your diabetes knows about your planned surgery including the date and location.  How do I manage my blood sugar before surgery? Check your blood sugar at least 4 times a day, starting 2 days before surgery, to make sure that the level is not too high or low.  Check your blood sugar the morning of your surgery when you wake up and every 2 hours until you get to the Short Stay unit.  If your blood sugar is less than 70 mg/dL, you will need to treat for low blood sugar: Do not take insulin . Treat a low blood sugar (less than 70 mg/dL) with  cup of clear juice (cranberry or apple), 4 glucose tablets, OR glucose gel. Recheck blood sugar in 15 minutes after treatment (to make sure it is greater than 70 mg/dL). If your blood  sugar is not greater than 70 mg/dL on recheck, call [phone] for further instructions. Report your blood sugar to the short stay nurse when you get to Short Stay.  If you are admitted to the hospital after surgery: Your blood sugar will be checked by the staff and you will probably be given insulin  after surgery (instead of oral diabetes medicines) to make sure you have good blood sugar levels. The goal for blood sugar control after surgery is 80-180 mg/dL.   As of today, STOP taking any Aspirin  (unless otherwise instructed by your surgeon) Aleve ,  Naproxen , Ibuprofen , Motrin , Advil , Goody's, BC's, all herbal medications, fish oil, and all vitamins.                      Do not wear jewelry, make up, or nail polish            Do not wear lotions, powders, perfumes/colognes, or deodorant.            Do not shave 48 hours prior to surgery.  Men may shave face and neck.            Do not bring valuables to the hospital.            [facility] is not responsible for any belongings or valuables.  Do NOT Smoke (Tobacco/Vaping) 24 hours prior to your procedure If you use a CPAP at night, you may bring all equipment for your overnight stay.   Contacts, glasses, dentures or bridgework may not be worn into surgery.      For patients admitted to the hospital, discharge time will be determined by your treatment team.   Patients discharged the day of surgery will not be allowed to drive home, and someone needs to stay with them for 24 hours.    Special instructions:   [place]- Preparing For Surgery  Before surgery, you can play an important role. Because skin is not sterile, your skin needs to be as free of germs as possible. You can reduce the number of germs on your skin by washing with CHG (chlorahexidine gluconate) Soap before surgery.  CHG is an antiseptic cleaner which kills germs and bonds with the skin to continue killing germs even after washing.    Oral Hygiene is also important to reduce your risk of infection.  Remember - BRUSH YOUR TEETH THE MORNING OF SURGERY WITH YOUR REGULAR TOOTHPASTE  Please do not use if you have an allergy to CHG or antibacterial soaps. If your skin becomes reddened/irritated stop using the CHG.  Do not shave (including legs and underarms) for at least 48 hours prior to first CHG shower. It is OK to shave your face.  Please follow these instructions carefully.   Shower the NIGHT BEFORE SURGERY and the MORNING OF SURGERY with DIAL Soap.   Pat yourself dry with a CLEAN TOWEL.  Wear CLEAN PAJAMAS to  bed the night before surgery  Place CLEAN SHEETS on your bed the night of your first shower and DO NOT SLEEP WITH PETS.   Day of Surgery: Please shower morning of surgery  Wear Clean/Comfortable clothing the morning of surgery Do not apply any deodorants/lotions.   Remember to brush your teeth WITH YOUR REGULAR TOOTHPASTE.   Questions were answered. Patient verbalized understanding of instructions.

## 2023-07-25 ENCOUNTER — Inpatient Hospital Stay (HOSPITAL_COMMUNITY): Payer: Medicare HMO

## 2023-07-25 ENCOUNTER — Encounter (HOSPITAL_COMMUNITY)
Admission: RE | Disposition: A | Discharge status: Home Health | Payer: Self-pay | Source: Home / Self Care | Attending: Orthopedic Surgery

## 2023-07-25 ENCOUNTER — Other Ambulatory Visit: Payer: Self-pay

## 2023-07-25 ENCOUNTER — Inpatient Hospital Stay (HOSPITAL_COMMUNITY): Payer: Self-pay | Admitting: Medical

## 2023-07-25 ENCOUNTER — Encounter (HOSPITAL_COMMUNITY): Payer: Self-pay | Admitting: Orthopedic Surgery

## 2023-07-25 ENCOUNTER — Inpatient Hospital Stay (HOSPITAL_COMMUNITY)
Admission: RE | Admit: 2023-07-25 | Discharge: 2023-07-26 | DRG: 481 | Disposition: A | Discharge status: Home Health | Payer: Medicare HMO | Attending: Orthopedic Surgery | Admitting: Orthopedic Surgery

## 2023-07-25 DIAGNOSIS — Z9889 Other specified postprocedural states: Principal | ICD-10-CM

## 2023-07-25 DIAGNOSIS — E1165 Type 2 diabetes mellitus with hyperglycemia: Secondary | ICD-10-CM | POA: Diagnosis present

## 2023-07-25 DIAGNOSIS — M25552 Pain in left hip: Secondary | ICD-10-CM | POA: Diagnosis present

## 2023-07-25 DIAGNOSIS — Z79899 Other long term (current) drug therapy: Secondary | ICD-10-CM

## 2023-07-25 DIAGNOSIS — C3490 Malignant neoplasm of unspecified part of unspecified bronchus or lung: Secondary | ICD-10-CM | POA: Diagnosis present

## 2023-07-25 DIAGNOSIS — Z794 Long term (current) use of insulin: Secondary | ICD-10-CM

## 2023-07-25 DIAGNOSIS — Z87891 Personal history of nicotine dependence: Secondary | ICD-10-CM

## 2023-07-25 DIAGNOSIS — E119 Type 2 diabetes mellitus without complications: Secondary | ICD-10-CM

## 2023-07-25 DIAGNOSIS — M84352A Stress fracture, left femur, initial encounter for fracture: Secondary | ICD-10-CM

## 2023-07-25 DIAGNOSIS — I10 Essential (primary) hypertension: Secondary | ICD-10-CM | POA: Diagnosis present

## 2023-07-25 DIAGNOSIS — N183 Chronic kidney disease, stage 3 unspecified: Secondary | ICD-10-CM | POA: Diagnosis not present

## 2023-07-25 DIAGNOSIS — M84452A Pathological fracture, left femur, initial encounter for fracture: Secondary | ICD-10-CM | POA: Diagnosis present

## 2023-07-25 DIAGNOSIS — Z7984 Long term (current) use of oral hypoglycemic drugs: Secondary | ICD-10-CM

## 2023-07-25 DIAGNOSIS — Z833 Family history of diabetes mellitus: Secondary | ICD-10-CM | POA: Diagnosis not present

## 2023-07-25 DIAGNOSIS — E1122 Type 2 diabetes mellitus with diabetic chronic kidney disease: Secondary | ICD-10-CM | POA: Diagnosis not present

## 2023-07-25 DIAGNOSIS — Z7982 Long term (current) use of aspirin: Secondary | ICD-10-CM

## 2023-07-25 DIAGNOSIS — M84459A Pathological fracture, hip, unspecified, initial encounter for fracture: Secondary | ICD-10-CM | POA: Diagnosis not present

## 2023-07-25 DIAGNOSIS — Z4789 Encounter for other orthopedic aftercare: Secondary | ICD-10-CM | POA: Diagnosis not present

## 2023-07-25 DIAGNOSIS — I129 Hypertensive chronic kidney disease with stage 1 through stage 4 chronic kidney disease, or unspecified chronic kidney disease: Secondary | ICD-10-CM | POA: Diagnosis not present

## 2023-07-25 DIAGNOSIS — C7951 Secondary malignant neoplasm of bone: Secondary | ICD-10-CM | POA: Diagnosis present

## 2023-07-25 HISTORY — PX: HIP SURGERY: SHX245

## 2023-07-25 HISTORY — PX: FEMUR IM NAIL: SHX1597

## 2023-07-25 LAB — GLUCOSE, CAPILLARY
Glucose-Capillary: 151 mg/dL — ABNORMAL HIGH (ref 70–99)
Glucose-Capillary: 145 mg/dL — ABNORMAL HIGH (ref 70–99)
Glucose-Capillary: 180 mg/dL — ABNORMAL HIGH (ref 70–99)
Glucose-Capillary: 275 mg/dL — ABNORMAL HIGH (ref 70–99)

## 2023-07-25 SURGERY — INTRAMEDULLARY (IM) NAIL FEMORAL
Anesthesia: General | Site: Leg Upper | Laterality: Left

## 2023-07-25 MED ORDER — SUGAMMADEX SODIUM 200 MG/2ML IV SOLN
INTRAVENOUS | Status: DC | PRN
  Administered 2023-07-25: 143.4 mg via INTRAVENOUS

## 2023-07-25 MED ORDER — ROCURONIUM BROMIDE 10 MG/ML (PF) SYRINGE
PREFILLED_SYRINGE | INTRAVENOUS | Status: DC | PRN
  Administered 2023-07-25: 60 mg via INTRAVENOUS

## 2023-07-25 MED ORDER — BUPIVACAINE HCL (PF) 0.25 % IJ SOLN
INTRAMUSCULAR | Status: AC
  Filled 2023-07-25: qty 30

## 2023-07-25 MED ORDER — INSULIN ASPART 100 UNIT/ML IJ SOLN
4.0000 [IU] | Freq: Three times a day (TID) | INTRAMUSCULAR | Status: DC
  Administered 2023-07-26 (×2): 4 [IU] via SUBCUTANEOUS

## 2023-07-25 MED ORDER — ONDANSETRON HCL 4 MG/2ML IJ SOLN
INTRAMUSCULAR | Status: AC
  Filled 2023-07-25: qty 2

## 2023-07-25 MED ORDER — ACETAMINOPHEN 500 MG PO TABS
1000.0000 mg | ORAL_TABLET | Freq: Four times a day (QID) | ORAL | Status: DC
  Administered 2023-07-25 – 2023-07-26 (×3): 1000 mg via ORAL
  Filled 2023-07-25 (×4): qty 2

## 2023-07-25 MED ORDER — METOCLOPRAMIDE HCL 5 MG/ML IJ SOLN: 5.0000 mg | Freq: Three times a day (TID) | INTRAMUSCULAR | Status: DC | PRN

## 2023-07-25 MED ORDER — ONDANSETRON HCL 4 MG/2ML IJ SOLN
INTRAMUSCULAR | Status: DC | PRN
Start: 2023-07-25 — End: 2023-07-25
  Administered 2023-07-25: 4 mg via INTRAVENOUS

## 2023-07-25 MED ORDER — TRANEXAMIC ACID-NACL 1000-0.7 MG/100ML-% IV SOLN
1000.0000 mg | INTRAVENOUS | Status: AC
  Administered 2023-07-25: 1000 mg via INTRAVENOUS
  Filled 2023-07-25: qty 100

## 2023-07-25 MED ORDER — POVIDONE-IODINE 10 % EX SWAB
2.0000 | Freq: Once | CUTANEOUS | Status: AC
  Administered 2023-07-25: 2 via TOPICAL

## 2023-07-25 MED ORDER — OXYCODONE HCL 5 MG PO TABS: 5.0000 mg | ORAL_TABLET | Freq: Once | ORAL | Status: AC | PRN

## 2023-07-25 MED ORDER — LIDOCAINE 2% (20 MG/ML) 5 ML SYRINGE
INTRAMUSCULAR | Status: DC | PRN
  Administered 2023-07-25: 100 mg via INTRAVENOUS

## 2023-07-25 MED ORDER — FENTANYL CITRATE (PF) 250 MCG/5ML IJ SOLN
INTRAMUSCULAR | Status: DC | PRN
  Administered 2023-07-25 (×3): 50 ug via INTRAVENOUS
  Administered 2023-07-25: 100 ug via INTRAVENOUS

## 2023-07-25 MED ORDER — PHENYLEPHRINE 80 MCG/ML (10ML) SYRINGE FOR IV PUSH (FOR BLOOD PRESSURE SUPPORT)
PREFILLED_SYRINGE | INTRAVENOUS | Status: AC
  Filled 2023-07-25: qty 10

## 2023-07-25 MED ORDER — GABAPENTIN 300 MG PO CAPS
300.0000 mg | ORAL_CAPSULE | Freq: Every morning | ORAL | Status: DC
  Administered 2023-07-26: 300 mg via ORAL
  Filled 2023-07-25: qty 1

## 2023-07-25 MED ORDER — HYDROMORPHONE HCL 1 MG/ML IJ SOLN: 0.5000 mg | INTRAMUSCULAR | Status: DC | PRN

## 2023-07-25 MED ORDER — METHOCARBAMOL 1000 MG/10ML IJ SOLN: 500.0000 mg | Freq: Four times a day (QID) | INTRAMUSCULAR | Status: DC | PRN

## 2023-07-25 MED ORDER — ACETAMINOPHEN 325 MG PO TABS: 325.0000 mg | ORAL_TABLET | Freq: Four times a day (QID) | ORAL | Status: DC | PRN

## 2023-07-25 MED ORDER — FENTANYL CITRATE (PF) 250 MCG/5ML IJ SOLN
INTRAMUSCULAR | Status: AC
  Filled 2023-07-25: qty 5

## 2023-07-25 MED ORDER — CHLORHEXIDINE GLUCONATE 0.12 % MT SOLN
15.0000 mL | Freq: Once | OROMUCOSAL | Status: AC
  Administered 2023-07-25: 15 mL via OROMUCOSAL
  Filled 2023-07-25: qty 15

## 2023-07-25 MED ORDER — FENTANYL CITRATE (PF) 100 MCG/2ML IJ SOLN
INTRAMUSCULAR | Status: AC
  Filled 2023-07-25: qty 2

## 2023-07-25 MED ORDER — INSULIN ASPART 100 UNIT/ML IJ SOLN
0.0000 [IU] | Freq: Every day | INTRAMUSCULAR | Status: DC
  Administered 2023-07-25: 3 [IU] via SUBCUTANEOUS

## 2023-07-25 MED ORDER — CLONIDINE HCL (ANALGESIA) 100 MCG/ML EP SOLN
EPIDURAL | Status: AC
  Filled 2023-07-25: qty 10

## 2023-07-25 MED ORDER — ROCURONIUM BROMIDE 10 MG/ML (PF) SYRINGE
PREFILLED_SYRINGE | INTRAVENOUS | Status: AC
  Filled 2023-07-25: qty 10

## 2023-07-25 MED ORDER — GLIPIZIDE ER 10 MG PO TB24
10.0000 mg | ORAL_TABLET | Freq: Every day | ORAL | Status: DC
  Administered 2023-07-26: 10 mg via ORAL
  Filled 2023-07-25 (×2): qty 1

## 2023-07-25 MED ORDER — FENTANYL CITRATE (PF) 100 MCG/2ML IJ SOLN
INTRAMUSCULAR | Status: AC
  Administered 2023-07-25: 50 ug via INTRAVENOUS
  Filled 2023-07-25: qty 2

## 2023-07-25 MED ORDER — MORPHINE SULFATE (PF) 4 MG/ML IV SOLN
INTRAVENOUS | Status: AC
  Filled 2023-07-25: qty 2

## 2023-07-25 MED ORDER — DEXAMETHASONE SODIUM PHOSPHATE 10 MG/ML IJ SOLN
INTRAMUSCULAR | Status: DC | PRN
  Administered 2023-07-25: 10 mg via INTRAVENOUS

## 2023-07-25 MED ORDER — 0.9 % SODIUM CHLORIDE (POUR BTL) OPTIME
TOPICAL | Status: DC | PRN
  Administered 2023-07-25: 1000 mL

## 2023-07-25 MED ORDER — MORPHINE SULFATE 4 MG/ML IJ SOLN
INTRAMUSCULAR | Status: DC | PRN
  Administered 2023-07-25: 32 mL via INTRAMUSCULAR

## 2023-07-25 MED ORDER — OXYCODONE HCL 5 MG/5ML PO SOLN
5.0000 mg | Freq: Once | ORAL | Status: AC | PRN
  Administered 2023-07-25: 5 mg via ORAL

## 2023-07-25 MED ORDER — LACTATED RINGERS IV SOLN: INTRAVENOUS | Status: DC | PRN

## 2023-07-25 MED ORDER — METOCLOPRAMIDE HCL 5 MG PO TABS: 5.0000 mg | ORAL_TABLET | Freq: Three times a day (TID) | ORAL | Status: DC | PRN

## 2023-07-25 MED ORDER — ASPIRIN 81 MG PO TBEC
81.0000 mg | DELAYED_RELEASE_TABLET | Freq: Every day | ORAL | Status: DC
  Administered 2023-07-26: 81 mg via ORAL
  Filled 2023-07-25: qty 1

## 2023-07-25 MED ORDER — OXYCODONE HCL 5 MG/5ML PO SOLN
ORAL | Status: AC
  Filled 2023-07-25: qty 5

## 2023-07-25 MED ORDER — NAPROXEN 250 MG PO TABS
250.0000 mg | ORAL_TABLET | Freq: Two times a day (BID) | ORAL | Status: DC
  Administered 2023-07-26: 250 mg via ORAL
  Filled 2023-07-25: qty 1

## 2023-07-25 MED ORDER — DOCUSATE SODIUM 100 MG PO CAPS
100.0000 mg | ORAL_CAPSULE | Freq: Two times a day (BID) | ORAL | Status: DC
  Administered 2023-07-25 – 2023-07-26 (×2): 100 mg via ORAL
  Filled 2023-07-25 (×2): qty 1

## 2023-07-25 MED ORDER — PHENYLEPHRINE 80 MCG/ML (10ML) SYRINGE FOR IV PUSH (FOR BLOOD PRESSURE SUPPORT)
PREFILLED_SYRINGE | INTRAVENOUS | Status: DC | PRN
  Administered 2023-07-25: 160 ug via INTRAVENOUS

## 2023-07-25 MED ORDER — PROPOFOL 10 MG/ML IV BOLUS
INTRAVENOUS | Status: AC
  Filled 2023-07-25: qty 20

## 2023-07-25 MED ORDER — LISINOPRIL 10 MG PO TABS
10.0000 mg | ORAL_TABLET | Freq: Every day | ORAL | Status: DC
  Administered 2023-07-26: 10 mg via ORAL
  Filled 2023-07-25: qty 1

## 2023-07-25 MED ORDER — ORAL CARE MOUTH RINSE: 15.0000 mL | Freq: Once | OROMUCOSAL | Status: AC

## 2023-07-25 MED ORDER — METHOCARBAMOL 500 MG PO TABS: 500.0000 mg | ORAL_TABLET | Freq: Four times a day (QID) | ORAL | Status: DC | PRN

## 2023-07-25 MED ORDER — EMPAGLIFLOZIN 10 MG PO TABS
10.0000 mg | ORAL_TABLET | Freq: Every day | ORAL | Status: DC
  Administered 2023-07-26: 10 mg via ORAL
  Filled 2023-07-25 (×3): qty 1

## 2023-07-25 MED ORDER — CEFAZOLIN SODIUM-DEXTROSE 2-4 GM/100ML-% IV SOLN
2.0000 g | INTRAVENOUS | Status: AC
  Administered 2023-07-25: 2 g via INTRAVENOUS
  Filled 2023-07-25: qty 100

## 2023-07-25 MED ORDER — INSULIN ASPART 100 UNIT/ML IJ SOLN
0.0000 [IU] | Freq: Three times a day (TID) | INTRAMUSCULAR | Status: DC
  Administered 2023-07-26: 5 [IU] via SUBCUTANEOUS
  Administered 2023-07-26: 2 [IU] via SUBCUTANEOUS

## 2023-07-25 MED ORDER — ACETAMINOPHEN 10 MG/ML IV SOLN
INTRAVENOUS | Status: AC
  Filled 2023-07-25: qty 100

## 2023-07-25 MED ORDER — PHENYLEPHRINE HCL-NACL 20-0.9 MG/250ML-% IV SOLN
INTRAVENOUS | Status: DC | PRN
  Administered 2023-07-25: 25 ug/min via INTRAVENOUS

## 2023-07-25 MED ORDER — FENTANYL CITRATE (PF) 100 MCG/2ML IJ SOLN: 50.0000 ug | Freq: Once | INTRAMUSCULAR | Status: AC

## 2023-07-25 MED ORDER — CEFAZOLIN SODIUM-DEXTROSE 2-4 GM/100ML-% IV SOLN
2.0000 g | Freq: Three times a day (TID) | INTRAVENOUS | Status: AC
  Administered 2023-07-25 – 2023-07-26 (×2): 2 g via INTRAVENOUS
  Filled 2023-07-25 (×2): qty 100

## 2023-07-25 MED ORDER — FENTANYL CITRATE (PF) 100 MCG/2ML IJ SOLN
25.0000 ug | INTRAMUSCULAR | Status: DC | PRN
  Administered 2023-07-25: 25 ug via INTRAVENOUS
  Administered 2023-07-25: 50 ug via INTRAVENOUS
  Administered 2023-07-25: 25 ug via INTRAVENOUS

## 2023-07-25 MED ORDER — POVIDONE-IODINE 7.5 % EX SOLN
Freq: Once | CUTANEOUS | Status: AC
  Filled 2023-07-25: qty 118

## 2023-07-25 MED ORDER — OXYCODONE HCL 5 MG PO TABS
5.0000 mg | ORAL_TABLET | ORAL | Status: DC | PRN
  Administered 2023-07-25 – 2023-07-26 (×4): 10 mg via ORAL
  Filled 2023-07-25 (×4): qty 2

## 2023-07-25 MED ORDER — ACETAMINOPHEN 10 MG/ML IV SOLN
1000.0000 mg | Freq: Once | INTRAVENOUS | Status: DC | PRN
  Administered 2023-07-25: 1000 mg via INTRAVENOUS

## 2023-07-25 MED ORDER — INSULIN ASPART 100 UNIT/ML IJ SOLN: 0.0000 [IU] | INTRAMUSCULAR | Status: DC | PRN

## 2023-07-25 MED ORDER — PROPOFOL 10 MG/ML IV BOLUS
INTRAVENOUS | Status: DC | PRN
  Administered 2023-07-25: 130 mg via INTRAVENOUS

## 2023-07-25 MED ORDER — ESMOLOL HCL 100 MG/10ML IV SOLN
INTRAVENOUS | Status: DC | PRN
  Administered 2023-07-25: 20 mg via INTRAVENOUS
  Administered 2023-07-25: 30 mg via INTRAVENOUS

## 2023-07-25 SURGICAL SUPPLY — 42 items
BAG COUNTER SPONGE SURGICOUNT (BAG) ×1
BIT DRILL INTERTAN LAG SCREW (BIT) ×1
BLADE CLIPPER SURG (BLADE)
BLADE SURG 15 STRL LF DISP TIS (BLADE) ×1
COVER PERINEAL POST (MISCELLANEOUS) ×1
COVER SURGICAL LIGHT HANDLE (MISCELLANEOUS) ×1
DRAPE HALF SHEET 40X57 (DRAPES)
DRAPE STERI IOBAN 125X83 (DRAPES) ×1
DRAPE SURG ORHT 6 SPLT 77X108 (DRAPES)
DRSG AQUACEL AG ADV 3.5X 4 (GAUZE/BANDAGES/DRESSINGS) ×2
DRSG MEPILEX FLEX 4X4 (GAUZE/BANDAGES/DRESSINGS)
DRSG MEPILEX POST OP 4X8 (GAUZE/BANDAGES/DRESSINGS) ×1
DURAPREP 26ML APPLICATOR (WOUND CARE) ×1
ELECT REM PT RETURN 9FT ADLT (ELECTROSURGICAL) ×1
FACESHIELD WRAPAROUND (MASK) ×1
GAUZE XEROFORM 5X9 LF (GAUZE/BANDAGES/DRESSINGS) ×1
GLOVE BIOGEL PI IND STRL 8 (GLOVE) ×1
GLOVE ECLIPSE 8.0 STRL XLNG CF (GLOVE) ×1
GOWN STRL REUS W/ TWL LRG LVL3 (GOWN DISPOSABLE) ×1
GOWN STRL REUS W/ TWL XL LVL3 (GOWN DISPOSABLE)
GUIDE PIN 3.2X343 (PIN) ×2
GUIDE ROD 3.0 (MISCELLANEOUS) ×1
KIT BASIN OR (CUSTOM PROCEDURE TRAY) ×1
KIT TURNOVER KIT B (KITS) ×1
MANIFOLD NEPTUNE II (INSTRUMENTS) ×1
NAIL TRIGEN 10X42CM 125D LT (Nail) ×1 IMPLANT
NS IRRIG 1000ML POUR BTL (IV SOLUTION) ×1
PACK GENERAL/GYN (CUSTOM PROCEDURE TRAY) ×1
PAD ARMBOARD 7.5X6 YLW CONV (MISCELLANEOUS) ×2
SCREW LAG COMPR KIT 95/90 (Screw) ×1 IMPLANT
SPONGE T-LAP 4X18 ~~LOC~~+RFID (SPONGE)
STAPLER VISISTAT 35W (STAPLE) ×1
SUT ETHILON 2 0 FS 18 (SUTURE)
SUT ETHILON 3 0 PS 1 (SUTURE) ×3
SUT VIC AB 0 CT1 27XBRD ANBCTR (SUTURE) ×2
SUT VIC AB 1 CT1 27XBRD ANBCTR (SUTURE) ×2
SUT VIC AB 2-0 CT2 27 (SUTURE) ×2
SUT VIC AB 2-0 CTB1 (SUTURE) ×1
TAPE STRIPS DRAPE STRL (GAUZE/BANDAGES/DRESSINGS)
TOWEL GREEN STERILE (TOWEL DISPOSABLE) ×1
TOWEL GREEN STERILE FF (TOWEL DISPOSABLE) ×1
WATER STERILE IRR 1000ML POUR (IV SOLUTION) ×1

## 2023-07-25 NOTE — Anesthesia Postprocedure Evaluation (Signed)
Anesthesia Post Note  Patient: Jonathan Page  Procedure(s) Performed: LEFT HIP INTRAMEDULLARY HIP SCREW (Left: Leg Upper)     Patient location during evaluation: PACU Anesthesia Type: General Level of consciousness: sedated and patient cooperative Pain management: pain level controlled Vital Signs Assessment: post-procedure vital signs reviewed and stable Respiratory status: spontaneous breathing Cardiovascular status: stable Anesthetic complications: no   No notable events documented.  Last Vitals:  Vitals:   07/25/23 1845 07/25/23 1900  BP: 109/74 118/82  Pulse: (!) 105 (!) 106  Resp: 17 10  Temp:    SpO2: 100% 98%    Last Pain:  Vitals:   07/25/23 1845  TempSrc:   PainSc: 7                  [name]

## 2023-07-25 NOTE — Transfer of Care (Signed)
Immediate Anesthesia Transfer of Care Note  Patient: Jonathan Page  Procedure(s) Performed: LEFT HIP INTRAMEDULLARY HIP SCREW (Left: Leg Upper)  Patient Location: PACU  Anesthesia Type:General  Level of Consciousness: awake and alert   Airway & Oxygen Therapy: Patient Spontanous Breathing  Post-op Assessment: Report given to RN and Post -op Vital signs reviewed and stable  Post vital signs: Reviewed and stable  Last Vitals:  Vitals Value Taken Time  BP 109/64 07/25/23 1826  Temp 98   Pulse 107 07/25/23 1828  Resp 13 07/25/23 1828  SpO2 96 % 07/25/23 1828  Vitals shown include unfiled device data.  Last Pain:  Vitals:   07/25/23 1524  TempSrc:   PainSc: 8          Complications: No notable events documented.

## 2023-07-25 NOTE — Anesthesia Procedure Notes (Signed)
Procedure Name: Intubation Date/Time: 07/25/2023 5:10 PM  Performed by: Darryl Nestle, CRNAPre-anesthesia Checklist: Patient identified, Emergency Drugs available, Suction available and Patient being monitored Patient Re-evaluated:Patient Re-evaluated prior to induction Oxygen Delivery Method: Circle system utilized Preoxygenation: Pre-oxygenation with 100% oxygen Induction Type: IV induction Ventilation: Mask ventilation without difficulty Laryngoscope Size: Mac and 3 Grade View: Grade II Tube type: Oral Tube size: 7.0 mm Number of attempts: 1 Airway Equipment and Method: Stylet and Oral airway Placement Confirmation: ETT inserted through vocal cords under direct vision, positive ETCO2 and breath sounds checked- equal and bilateral Secured at: 24 cm Tube secured with: Tape Dental Injury: Teeth and Oropharynx as per pre-operative assessment

## 2023-07-25 NOTE — Op Note (Unsigned)
NAME: RIDGELY, ANASTACIO MEDICAL RECORD NO: [id number] ACCOUNT NO: [id number] DATE OF BIRTH: [birth year] FACILITY: [facility] LOCATION: [facility] PHYSICIAN: Graylin Shiver. August Saucer, MD  Operative Report   DATE OF PROCEDURE: 07/25/2023  PREOPERATIVE DIAGNOSES:  Left hip stress fracture/pathologic fracture, intertrochanteric region.  POSTOPERATIVE DIAGNOSES:  Left hip stress fracture/pathologic fracture, intertrochanteric region.  PROCEDURE:  Left hip stress/pathologic intertrochanteric fracture open reduction and internal fixation with intramedullary hip screw, Smith and Nephew 10 mm x 42 cm.  SURGEON:  Graylin Shiver. August Saucer, MD.  ASSISTANT:  Karenann Cai, PA.  INDICATIONS:  Patient is a 73 year old patient with metastatic lung cancer who presents for operative management of metastatic fracture to his proximal femur.  DESCRIPTION OF PROCEDURE:  The patient was brought to the operating room where a general anesthetic was induced.  Preoperative antibiotics were administered and time-out was called.  Patient was placed on the fracture bed with the right leg in lithotomy  position well padded. The left hip was prescrubbed with alcohol and Betadine and allowed to air dry, prepped with DuraPrep solution and draped in a sterile manner.  Collier Flowers was used to cover the operative field ____. Timeout was called. An incision was  made 4 fingerbreadths proximal to the tip of the trochanter. Skin and subcutaneous tissue were sharply divided. Fascia was incised. A guide pin was placed across from the tip of the trochanter into the proximal femur under fluoroscopic guidance.  Sclerotic bone was encountered. This required guide pin passage plus proximal reaming. In accordance with preoperative templating, after proximal reaming was performed, the 10 x 42 nail was placed. Through a separate incision, compression screw and  derotational screw were placed in good position about 5 mm from the articular surface. There was also sclerotic bone  encountered in the passage of these screws. The tip of the nail within the canal about a centimeter from the tip of the patella. Thorough  irrigation on the incisions was then performed. They were then anesthetized using a combination of Marcaine, morphine, and clonidine. The incision was then closed using #1 Vicryl suture followed by 0 Vicryl suture, 2-0 Vicryl suture, and 3-0 nylon with  impervious dressings placed. The patient tolerated the procedure well without immediate complications. He will be weight-bearing as tolerated.   Luke's assistance was required for opening and closing, nail passage. His assistance was of medical necessity.   CHR D: 07/25/2023 6:31:30 pm T: 07/25/2023 7:42:00 pm  JOB: [id number]/ [id number]

## 2023-07-25 NOTE — Brief Op Note (Signed)
   07/25/2023  6:27 PM  PATIENT:  Jonathan Page  73 y.o. male  PRE-OPERATIVE DIAGNOSIS:  left femoral neck stress/pathologic fracture  POST-OPERATIVE DIAGNOSIS:  left femoral neck stress/pathologic fracture  PROCEDURE:  Procedure(s): LEFT HIP INTRAMEDULLARY HIP SCREW  SURGEON:  Surgeon(s): Cammy Copa, MD  ASSISTANT: magnant pa  ANESTHESIA:   general  EBL: 150 ml    Total I/O In: 900 [I.V.:900] Out: 150 [Blood:150]  BLOOD ADMINISTERED: none  DRAINS: none   LOCAL MEDICATIONS USED:  marcaine morphine clonidine  SPECIMEN:  No Specimen  COUNTS:  YES  TOURNIQUET:  * No tourniquets in log *  DICTATION: .Other Dictation: Dictation Number [id number]  PLAN OF CARE: Admit for overnight observation  PATIENT DISPOSITION:  PACU - hemodynamically stable

## 2023-07-25 NOTE — H&P (Signed)
Jonathan Page is an 73 y.o. male.   Chief Complaint: left hip pain HPI:  Jonathan Page is a 73 y.o. male who presents reporting bilateral leg pain and left groin pain.  Patient has metastatic lung cancer.  Currently he will be finishing radiation treatment last Friday.  He is using a cane.  He has had a PET scan which is reviewed which does show metastatic disease in both proximal femurs.  On the left-hand side there is actually an early pathologic fracture on the tension side of the femoral neck.  On the right-hand side there is metastatic disease which is noncritical in terms of impending pathologic fracture.  Patient denies any recent falls..  Does have axial spine in the lumbar region metastatic disease being treated by Dr. Alvester Morin..    Past Medical History:  Diagnosis Date   Allergy    Cancer (HCC)    Cellulitis 02/21/2018   Concussion syndrome    Diabetes (HCC)    Frequent headaches    Hypertension     Past Surgical History:  Procedure Laterality Date   APPENDECTOMY  1960   BRONCHIAL BIOPSY  06/12/2023   Procedure: BRONCHIAL BIOPSIES;  Surgeon: Josephine Igo, DO;  Location: [facility];  Service: Cardiopulmonary;;   BRONCHIAL BRUSHINGS  06/12/2023   Procedure: BRONCHIAL BRUSHINGS;  Surgeon: Josephine Igo, DO;  Location: [facility];  Service: Cardiopulmonary;;   COLONOSCOPY     IR IMAGING GUIDED PORT INSERTION  07/13/2023   VIDEO BRONCHOSCOPY Bilateral 06/12/2023   Procedure: VIDEO BRONCHOSCOPY;  Surgeon: Josephine Igo, DO;  Location: [facility];  Service: Cardiopulmonary;  Laterality: Bilateral;   WISDOM TOOTH EXTRACTION      Family History  Problem Relation Age of Onset   Diabetes Father    Diabetes Paternal Grandfather    Diabetes Paternal Grandmother    Colon cancer Neg Hx    Crohn's disease Neg Hx    Rectal cancer Neg Hx    Stomach cancer Neg Hx    Esophageal cancer Neg Hx    Social History:  reports that he quit smoking about 4 years ago. His smoking use  included cigarettes and cigars. He started smoking about 34 years ago. He has a 15 pack-year smoking history. He has never used smokeless tobacco. He reports current alcohol use of about 6.0 standard drinks of alcohol per week. He reports that he does not use drugs.  Allergies: No Known Allergies  Medications Prior to Admission  Medication Sig Dispense Refill   aspirin EC 81 MG tablet Take 81 mg by mouth daily.     empagliflozin (JARDIANCE) 10 MG TABS tablet Take 1 tablet (10 mg total) by mouth daily before breakfast. 30 tablet 3   folic acid (FOLVITE) 1 MG tablet Take 1 mg by mouth in the morning.     gabapentin (NEURONTIN) 300 MG capsule Take 1 capsule (300 mg total) by mouth at bedtime. (Patient taking differently: Take 300 mg by mouth in the morning.) 30 capsule 1   glipiZIDE (GLUCOTROL XL) 10 MG 24 hr tablet Take 1 tablet (10 mg total) by mouth daily with breakfast. for diabetes. 90 tablet 3   insulin NPH-regular Human (70-30) 100 UNIT/ML injection Inject 5 Units into the skin daily with breakfast.     lidocaine-prilocaine (EMLA) cream Apply 1 Application topically as needed. 30 g 1   lisinopril (ZESTRIL) 10 MG tablet Take 1 tablet (10 mg total) by mouth daily. for blood pressure. 90 tablet 0  oxyCODONE-acetaminophen (PERCOCET/ROXICET) 5-325 MG tablet Take 1 tablet by mouth every 8 (eight) hours as needed for severe pain (pain score 7-10). 30 tablet 0   Blood Glucose Monitoring Suppl (ACCU-CHEK AVIVA PLUS) w/Device KIT Check blood sugar before breakfast, lunch and bedtime and as directed. Dx E11.65 1 kit 0   glucose blood (ACCU-CHEK AVIVA PLUS) test strip Check blood sugar before breakfast, lunch and bedtime and as directed. Dx E11.65 300 each 1   Lancets (ACCU-CHEK SOFT TOUCH) lancets Check blood sugar before breakfast, lunch and bedtime and as directed. Dx E11.65 300 each 1   ondansetron (ZOFRAN) 8 MG tablet Take 8 mg by mouth every 8 (eight) hours as needed for nausea or vomiting.      prochlorperazine (COMPAZINE) 10 MG tablet Take 1 tablet (10 mg total) by mouth every 6 (six) hours as needed for nausea or vomiting. 30 tablet 0    Results for orders placed or performed during the hospital encounter of 07/25/23 (from the past 48 hours)  Glucose, capillary     Status: Abnormal   Collection Time: 07/25/23  2:08 PM  Result Value Ref Range   Glucose-Capillary 151 (H) 70 - 99 mg/dL    Comment: Glucose reference range applies only to samples taken after fasting for at least 8 hours.   Comment 1 Notify RN    Comment 2 Document in Chart    No results found.  Review of Systems  Musculoskeletal:  Positive for arthralgias.  All other systems reviewed and are negative.   Blood pressure 133/80, pulse 97, temperature 97.7 F (36.5 C), temperature source Oral, resp. rate 18, height 6\' 1"  (1.854 m), weight 71.7 kg, SpO2 98%. Physical Exam Vitals reviewed.  HENT:     Head: Normocephalic.     Nose: Nose normal.     Mouth/Throat:     Mouth: Mucous membranes are moist.  Eyes:     Pupils: Pupils are equal, round, and reactive to light.  Cardiovascular:     Rate and Rhythm: Normal rate.     Pulses: Normal pulses.  Pulmonary:     Effort: Pulmonary effort is normal.  Abdominal:     General: Abdomen is flat.  Musculoskeletal:     Cervical back: Normal range of motion.  Skin:    General: Skin is warm.     Capillary Refill: Capillary refill takes less than 2 seconds.  Neurological:     General: No focal deficit present.     Mental Status: He is alert.  Psychiatric:        Mood and Affect: Mood normal.    Ortho exam demonstrates minimal groin pain on the right with internal/external Tatian of the leg mild groin pain on the left. He does have a little bit less hip flexion strength at 5- out of 5 on the left compared to the right. No tenderness to palpation along either femur. Abduction adduction strength 5+ out of 5 bilaterally. He is able to walk with a cane with no limp. Pedal  pulses palpable.   Assessment/Plan Impression is impending pathologic fracture left proximal femur. Intramedullary hip screw fixation indicated for this patient who is still active. The rationale of doing this before it completely breaks is discussed with the patient. In general it is a very straightforward procedure at this time with fairly minimal blood loss and minimal downtime for him. However if he waits until it actually fractures then can procedure becomes much more morbid for him and his quality of life  is more adversely affected for a longer period of time by delaying treatment. All this is explained to him. He asked me to talk to Dr. Shirline Frees which I did and Dr. Shirline Frees agrees that for quality of life issues treatment of this impending pathologic fracture is indicated.  Patient wishes to proceed.  All questions answered.  Anticipate overnight hospitalization with weightbearing as tolerated postop day #1.  Burnard Bunting, MD 07/25/2023, 3:37 PM

## 2023-07-26 ENCOUNTER — Inpatient Hospital Stay: Payer: Medicare HMO | Admitting: Nurse Practitioner

## 2023-07-26 ENCOUNTER — Inpatient Hospital Stay: Payer: Medicare HMO

## 2023-07-26 ENCOUNTER — Encounter (HOSPITAL_COMMUNITY): Payer: Self-pay | Admitting: Orthopedic Surgery

## 2023-07-26 LAB — COMPREHENSIVE METABOLIC PANEL
ALT: 17 U/L (ref 0–44)
AST: 18 U/L (ref 15–41)
Albumin: 3 g/dL — ABNORMAL LOW (ref 3.5–5.0)
Alkaline Phosphatase: 151 U/L — ABNORMAL HIGH (ref 38–126)
Anion gap: 13 (ref 5–15)
BUN: 24 mg/dL — ABNORMAL HIGH (ref 8–23)
CO2: 22 mmol/L (ref 22–32)
Calcium: 9.4 mg/dL (ref 8.9–10.3)
Chloride: 101 mmol/L (ref 98–111)
Creatinine, Ser: 1.59 mg/dL — ABNORMAL HIGH (ref 0.61–1.24)
GFR, Estimated: 46 mL/min — ABNORMAL LOW (ref >60)
Glucose, Bld: 214 mg/dL — ABNORMAL HIGH (ref 70–99)
Potassium: 4.8 mmol/L (ref 3.5–5.1)
Sodium: 136 mmol/L (ref 135–145)
Total Bilirubin: 0.4 mg/dL (ref 0.0–1.2)
Total Protein: 6 g/dL — ABNORMAL LOW (ref 6.5–8.1)

## 2023-07-26 LAB — GLUCOSE, CAPILLARY
Glucose-Capillary: 233 mg/dL — ABNORMAL HIGH (ref 70–99)
Glucose-Capillary: 146 mg/dL — ABNORMAL HIGH (ref 70–99)
Glucose-Capillary: 78 mg/dL (ref 70–99)

## 2023-07-26 LAB — CBC WITH DIFFERENTIAL/PLATELET
Abs Immature Granulocytes: 0.08 10*3/uL — ABNORMAL HIGH (ref 0.00–0.07)
Basophils Absolute: 0 10*3/uL (ref 0.0–0.1)
Basophils Relative: 0 %
Eosinophils Absolute: 0.1 10*3/uL (ref 0.0–0.5)
Eosinophils Relative: 1 %
HCT: 23.9 % — ABNORMAL LOW (ref 39.0–52.0)
Hemoglobin: 7.6 g/dL — ABNORMAL LOW (ref 13.0–17.0)
Immature Granulocytes: 1 %
Lymphocytes Relative: 7 %
Lymphs Abs: 0.9 10*3/uL (ref 0.7–4.0)
MCH: 30.6 pg (ref 26.0–34.0)
MCHC: 31.8 g/dL (ref 30.0–36.0)
MCV: 96.4 fL (ref 80.0–100.0)
Monocytes Absolute: 0.8 10*3/uL (ref 0.1–1.0)
Monocytes Relative: 6 %
Neutro Abs: 10.4 10*3/uL — ABNORMAL HIGH (ref 1.7–7.7)
Neutrophils Relative %: 85 %
Platelets: 140 10*3/uL — ABNORMAL LOW (ref 150–400)
RBC: 2.48 MIL/uL — ABNORMAL LOW (ref 4.22–5.81)
RDW: 19.8 % — ABNORMAL HIGH (ref 11.5–15.5)
WBC: 12.3 10*3/uL — ABNORMAL HIGH (ref 4.0–10.5)
nRBC: 0 % (ref 0.0–0.2)

## 2023-07-26 MED ORDER — DOCUSATE SODIUM 100 MG PO CAPS: 100.0000 mg | ORAL_CAPSULE | Freq: Two times a day (BID) | ORAL | 0 refills | Status: DC

## 2023-07-26 MED ORDER — SODIUM CHLORIDE 0.9 % IV SOLN: INTRAVENOUS | Status: AC

## 2023-07-26 MED ORDER — METHOCARBAMOL 500 MG PO TABS: 500.0000 mg | ORAL_TABLET | Freq: Three times a day (TID) | ORAL | 0 refills | Status: DC | PRN

## 2023-07-26 MED ORDER — OXYCODONE HCL 5 MG PO TABS: 5.0000 mg | ORAL_TABLET | ORAL | 0 refills | Status: DC | PRN

## 2023-07-26 NOTE — Progress Notes (Signed)
Mobility Specialist Progress Note:    07/26/23 1500  Mobility  Activity Ambulated with assistance in hallway  Level of Assistance Minimal assist, patient does 75% or more  Assistive Device Front wheel walker  Distance Ambulated (ft) 120 ft  LLE Weight Bearing Per Provider Order WBAT  Activity Response Tolerated well  Mobility Referral Yes  Mobility visit 1 Mobility  Mobility Specialist Start Time (ACUTE ONLY) 1523  Mobility Specialist Stop Time (ACUTE ONLY) 1538  Mobility Specialist Time Calculation (min) (ACUTE ONLY) 15 min   Pt received in bed and agreeable w/ encouragement. Required minA to stand and begin ambulation d/t posterior lean. Later faded into contact guard. No complaints throughout. Pt left in bed with call bell and all needs met. Bed alarm on.  Jonathan Page Mobility Specialist Please contact via Special educational needs teacher or Rehab office at [phone]

## 2023-07-26 NOTE — Progress Notes (Signed)
  Subjective: Jonathan Page is a 73 y.o. male s/p left hip IM nail.  They are POD 1.  Pt's pain is controlled.  Has not ambulated yet.  No complaint of chest pain, shortness of breath.  Denies any abdominal pain or pelvic pain.  Really does not have any significant postop pain and has not had to use any pain medication today.  Did have a dose yesterday.  He has no significant amount of stairs in order to get into his house.  He will be staying with his wife.  No dizziness or lightheadedness..    Objective: Vital signs in last 24 hours: Temp:  [97.4 F (36.3 C)-98.6 F (37 C)] 98.6 F (37 C) (02/12 0741) Pulse Rate:  [89-108] 89 (02/12 0741) Resp:  [10-21] 16 (02/12 0741) BP: (90-133)/(64-82) 98/73 (02/12 0741) SpO2:  [92 %-100 %] 94 % (02/12 0741) Weight:  [71.7 kg] 71.7 kg (02/11 1405)  Intake/Output from previous day: 02/11 0701 - 02/12 0700 In: 1240 [P.O.:240; I.V.:900; IV Piggyback:100] Out: 150 [Blood:150] Intake/Output this shift: No intake/output data recorded.  Exam:  No gross blood or drainage overlying the dressing 2+ DP pulse Sensation intact distally in the left foot Able to dorsiflex and plantarflex the left foot No calf tenderness.  Negative Homans' sign.   Labs: Recent Labs    07/24/23 0739  HGB 8.5*   Recent Labs    07/24/23 0739  WBC 10.6*  RBC 2.74*  HCT 26.4*  PLT 176   Recent Labs    07/24/23 0739  NA 141  K 4.6  CL 107  CO2 28  BUN 22  CREATININE 1.43*  GLUCOSE 293*  CALCIUM 9.8   No results for input(s): "LABPT", "INR" in the last 72 hours.  Assessment/Plan: Pt is POD 1 s/p left hip IM nail.    -Okay for full weightbearing as tolerated with walker.  -DVT Prophylaxis: Aspirin and SCDs  -Anticipate discharge likely today unless patient struggles to mobilize with PT.     Luke Jonnie Truxillo 07/26/2023, 8:42 AM

## 2023-07-26 NOTE — Telephone Encounter (Signed)
Please have patient set up office visit for mid March.

## 2023-07-26 NOTE — Evaluation (Addendum)
 Physical Therapy Evaluation Patient Details Name: Jonathan Page MRN: [id number] DOB: January 01, 1951 Today's Date: 07/26/2023  History of Present Illness  73 y.o. male presents to [facility] 07/25/23 with B LE pain and L groin pain 2/2 metastatic disease in B proximal femurs. L femur with early pathologic fx on tension side of femoral neck, s/p L hip ORIF and IM hip screw 2/11. Pt also with lumbar region metastatic disease. PMHx: metastatic lung cancer, DM, HTN   Clinical Impression  Pt in bed upon arrival and agreeable to PT eval. Prior to admit, pt was ModI with quad cane for mobility. In today's session, pt was able to stand with CGA and RW. Pt was able to ambulate ~120 ft total with one seated rest break due to feeling SOB, SpO2 >95% on RA. Pt reports having decreased activity tolerance with feeling SOB due to metastatic lung cancer. Pt was also able to ascend/descend 2 steps with RW and CGA for stability and safety. Pt presents to therapy session with decreased balance, L>R LE strength, and mobility. Pt would benefit from acute skilled PT to address functional impairments. Recommending post-acute HHPT to work towards independence with mobility. Acute PT to follow.          If plan is discharge home, recommend the following: A little help with walking and/or transfers;A little help with bathing/dressing/bathroom;Assistance with cooking/housework;Assist for transportation;Help with stairs or ramp for entrance   Can travel by private vehicle    Yes    Equipment Recommendations Rolling walker (2 wheels)  Recommendations for Other Services  OT consult    Functional Status Assessment Patient has had a recent decline in their functional status and demonstrates the ability to make significant improvements in function in a reasonable and predictable amount of time.     Precautions / Restrictions Precautions Precautions: Fall Restrictions Weight Bearing Restrictions Per Provider Order: Yes LLE Weight  Bearing Per Provider Order: Weight bearing as tolerated      Mobility  Bed Mobility Overal bed mobility: Needs Assistance Bed Mobility: Supine to Sit, Sit to Supine    Supine to sit: Contact guard Sit to supine: Min assist   General bed mobility comments: Increased time and effort with CGA for safety. Cues for hand placement when scooting towards EOB. MinA for return to supine with slight assist for L LE raise.    Transfers Overall transfer level: Needs assistance Equipment used: Rolling walker (2 wheels) Transfers: Sit to/from Stand Sit to Stand: Contact guard assist  General transfer comment: CGA for safety, cues for hand placement with RW    Ambulation/Gait Ambulation/Gait assistance: Contact guard assist Gait Distance (Feet): 140 Feet (x20, x60, x60) Assistive device: Rolling walker (2 wheels) Gait Pattern/deviations: Step-through pattern, Decreased step length - left, Decreased weight shift to left, Trunk flexed Gait velocity: decr     General Gait Details: decreased weight shift to L LE short and steady steps. Seated rest break in between x60 ft due to feeling SOB. SpO2 >95% on RA, cues for PLB  Stairs Stairs: Yes Stairs assistance: Contact guard assist Stair Management: No rails, Backwards, Forwards, With walker Number of Stairs: 2 General stair comments: cues for technique with RW and to ascend w/ R LE and descend with L LE. CGA for safety.    Balance Overall balance assessment: Needs assistance, Mild deficits observed, not formally tested, History of Falls Sitting-balance support: No upper extremity supported, Feet supported Sitting balance-Leahy Scale: Good     Standing balance support: Bilateral upper extremity  supported, During functional activity, Reliant on assistive device for balance Standing balance-Leahy Scale: Fair Standing balance comment: able to stand statically with no AD, RW for gait          Pertinent Vitals/Pain Pain Assessment Pain  Assessment: Faces Faces Pain Scale: Hurts a little bit Pain Location: L LE with movement Pain Descriptors / Indicators: Aching, Discomfort Pain Intervention(s): Limited activity within patient's tolerance, Monitored during session, Repositioned, Premedicated before session    Home Living Family/patient expects to be discharged to:: Private residence Living Arrangements: Spouse/significant other Available Help at Discharge: Family;Available PRN/intermittently (wife works from 5-11 am, can be 24/7 for a few days after d/c) Type of Home: House Home Access: Stairs to enter Entrance Stairs-Rails: None (has columns on B sides) Entrance Stairs-Number of Steps: 2   Home Layout: One level Home Equipment: Cane - quad;Shower seat;Grab bars - tub/shower      Prior Function Prior Level of Function : History of Falls (last six months);Needs assist       Physical Assist : ADLs (physical)   ADLs (physical): Dressing Mobility Comments: 2 falls due to LOB, ModI with quad cane ADLs Comments: assist with don/doff shoes and socks. Wife cooks and drives     Extremity/Trunk Assessment   Upper Extremity Assessment Upper Extremity Assessment: Defer to OT evaluation    Lower Extremity Assessment Lower Extremity Assessment: Generalized weakness (L>R weakness)    Cervical / Trunk Assessment Cervical / Trunk Assessment: Normal  Communication   Communication Communication: No apparent difficulties    Cognition Arousal: Alert Behavior During Therapy: WFL for tasks assessed/performed   PT - Cognitive impairments: No apparent impairments  Following commands: Intact       Cueing Cueing Techniques: Verbal cues     General Comments General comments (skin integrity, edema, etc.): SpO2 >95% on RA, RN notifed of O2 sats and pt able to urinate     PT Assessment Patient needs continued PT services  PT Problem List Decreased strength;Decreased range of motion;Decreased balance;Decreased activity  tolerance;Decreased mobility;Decreased knowledge of use of DME       PT Treatment Interventions DME instruction;Gait training;Stair training;Functional mobility training;Therapeutic activities;Therapeutic exercise;Balance training;Neuromuscular re-education;Patient/family education    PT Goals (Current goals can be found in the Care Plan section)  Acute Rehab PT Goals Patient Stated Goal: to get better PT Goal Formulation: With patient Time For Goal Achievement: 08/09/23 Potential to Achieve Goals: Good    Frequency Min 1X/week        AM-PAC PT "6 Clicks" Mobility  Outcome Measure Help needed turning from your back to your side while in a flat bed without using bedrails?: A Little Help needed moving from lying on your back to sitting on the side of a flat bed without using bedrails?: A Little Help needed moving to and from a bed to a chair (including a wheelchair)?: A Little Help needed standing up from a chair using your arms (e.g., wheelchair or bedside chair)?: A Little Help needed to walk in hospital room?: A Little Help needed climbing 3-5 steps with a railing? : A Little 6 Click Score: 18    End of Session Equipment Utilized During Treatment: Gait belt Activity Tolerance: Patient tolerated treatment well Patient left: in bed;with call bell/phone within reach;with bed alarm set Nurse Communication: Mobility status;Other (comment) (O2 sats) PT Visit Diagnosis: Other abnormalities of gait and mobility (R26.89);History of falling (Z91.81);Muscle weakness (generalized) (M62.81)    Time: 6045-4098 PT Time Calculation (min) (ACUTE ONLY): 43 min  Charges:   PT Evaluation $PT Eval Low Complexity: 1 Low PT Treatments $Gait Training: 8-22 mins $Therapeutic Activity: 8-22 mins PT General Charges $$ ACUTE PT VISIT: 1 Visit       Hilton Cork, PT, DPT Secure Chat Preferred  Rehab Office [phone]   Arturo Morton Brion Aliment 07/26/2023, 11:55 AM

## 2023-07-26 NOTE — Progress Notes (Incomplete)
S/p IMN to L hip   07/26/23 1228  TOC Brief Assessment  Insurance and Status Reviewed  Patient has primary care physician Yes  Home environment has been reviewed From home with spouse  Prior level of function: PTA independent with ADL's  Prior/Current Home Services No current home services  Social Drivers of Health Review SDOH reviewed no interventions necessary  Readmission risk has been reviewed No  Transition of care needs transition of care needs identified, TOC will continue to follow (s/p left hip IM nail, 2/11)   NCM noted PT's recommendation for home health services. Pt agreeable. Pt without provider preference. Referral made with Amy/ [facility] for home health services and accepted pending MD's orders. Referral made with [employer] for RW, Equipment will be delivered to bedside prior to d/c. Pt without RX med concern or transportation issues. TOC team following and will assist with needs Gae Gallop RN,BSN,CM [phone]

## 2023-07-26 NOTE — Discharge Planning (Signed)
Patient alert. IV access removed.  Discharge teaching given to patient Jonathan Page. Jonathan Page was also in the room and heard the instructions. Patient verbalized understanding of teaching including medications. Neither patient nor Ms Jonathan Page had any questions. Discharge instructions placed in discharge packet and put with patient belongings. Patient will be transported home via person vehicle by [employer].

## 2023-07-26 NOTE — Telephone Encounter (Signed)
Patient has not set up office visit. He has had surgery on hip on 07/25/23. Do you want [place] to reach out to patient to set up appointment in 2 weeks or more? Or do you need to see sooner?

## 2023-07-27 ENCOUNTER — Telehealth: Payer: Self-pay

## 2023-07-27 NOTE — Transitions of Care (Post Inpatient/ED Visit) (Signed)
07/27/2023  Name: Jonathan Page MRN: [id number] DOB: [birth year]  Today's TOC FU Call Status: Today's TOC FU Call Status:: Successful TOC FU Call Completed TOC FU Call Complete Date: 07/27/23 Patient's Name and Date of Birth confirmed.  Transition Care Management Follow-up Telephone Call Date of Discharge: 07/27/23 Discharge Facility: [facility]) Type of Discharge: Inpatient Admission Primary Inpatient Discharge Diagnosis:: IM Nailing of left hip How have you been since you were released from the hospital?: Better Any questions or concerns?: No  Items Reviewed: Did you receive and understand the discharge instructions provided?: Yes Medications obtained,verified, and reconciled?: Yes (Medications Reviewed) Any new allergies since your discharge?: No Dietary orders reviewed?: NA Do you have support at home?: Yes People in Home: spouse Name of Support/Comfort Primary Source: Frederica Kuster  Medications Reviewed Today: Medications Reviewed Today     Reviewed by Redge Gainer, RN (Case Manager) on 07/27/23 at 1512  Med List Status: <None>   Medication Order Taking? Sig Documenting Provider Last Dose Status Informant  aspirin EC 81 MG tablet [id number] No Take 81 mg by mouth daily. [provider] Past Week Active Self  Blood Glucose Monitoring Suppl (ACCU-CHEK AVIVA PLUS) w/Device KIT [id number] No Check blood sugar before breakfast, lunch and bedtime and as directed. Dx E11.65 Doreene Nest, NP Past Month Active Self  docusate sodium (COLACE) 100 MG capsule [id number]  Take 1 capsule (100 mg total) by mouth 2 (two) times daily. Magnant, Joycie Peek, PA-C  Active   empagliflozin (JARDIANCE) 10 MG TABS tablet [id number] No Take 1 tablet (10 mg total) by mouth daily before breakfast. Karie Schwalbe, MD 07/24/2023 Active Self  folic acid (FOLVITE) 1 MG tablet [id number] No Take 1 mg by mouth in the morning. [provider] 07/24/2023 Active Self  gabapentin  (NEURONTIN) 300 MG capsule [id number] No Take 1 capsule (300 mg total) by mouth at bedtime.  Patient taking differently: Take 300 mg by mouth in the morning.   Heilingoetter, Cassandra L, PA-C 07/24/2023 Active Self  glipiZIDE (GLUCOTROL XL) 10 MG 24 hr tablet [id number] No Take 1 tablet (10 mg total) by mouth daily with breakfast. for diabetes. Tillman Abide I, MD 07/23/2023 Active Self  glucose blood (ACCU-CHEK AVIVA PLUS) test strip [id number] No Check blood sugar before breakfast, lunch and bedtime and as directed. Dx E11.65 Doreene Nest, NP Past Month Active Self  insulin NPH-regular Human (70-30) 100 UNIT/ML injection [id number] No Inject 5 Units into the skin daily with breakfast. [provider] 07/24/2023 Active Self           Med Note Tiburcio Pea, Jacqulyn Bath T   Fri Jul 21, 2023 10:45 AM)    Lancets (ACCU-CHEK SOFT TOUCH) lancets [id number] No Check blood sugar before breakfast, lunch and bedtime and as directed. Dx E11.65 Doreene Nest, NP Past Month Active Self  lidocaine-prilocaine (EMLA) cream [id number] No Apply 1 Application topically as needed. Si Gaul, MD Past Week Active Self           Med Note Tiburcio Pea, Luvenia Redden   Fri Jul 21, 2023 10:46 AM)    lisinopril (ZESTRIL) 10 MG tablet [id number] No Take 1 tablet (10 mg total) by mouth daily. for blood pressure. Doreene Nest, NP 07/24/2023 Active Self  methocarbamol (ROBAXIN) 500 MG tablet [id number]  Take 1 tablet (500 mg total) by mouth every 8 (eight) hours as needed for muscle spasms. Magnant, Joycie Peek, PA-C  Active   ondansetron (ZOFRAN) 8 MG tablet [id number]  Take  8 mg by mouth every 8 (eight) hours as needed for nausea or vomiting. [provider]  Active Self           Med Note Tiburcio Pea, Luvenia Redden   Fri Jul 21, 2023 10:44 AM) On hand (never used)  oxyCODONE (OXY IR/ROXICODONE) 5 MG immediate release tablet [id number]  Take 1 tablet (5 mg total) by mouth every 4 (four) hours as needed for moderate pain (pain  score 4-6). Magnant, Joycie Peek, PA-C  Active   prochlorperazine (COMPAZINE) 10 MG tablet [id number]  Take 1 tablet (10 mg total) by mouth every 6 (six) hours as needed for nausea or vomiting. Si Gaul, MD  Active Self           Med Note Tiburcio Pea, Luvenia Redden   Fri Jul 21, 2023 10:40 AM) On hand (never needed to use)            Home Care and Equipment/Supplies: Were Home Health Services Ordered?: Yes Name of Home Health Agency:: [phone] Has Agency set up a time to come to your home?: No EMR reviewed for Home Health Orders: Orders present/patient has not received call (refer to CM for follow-up) Any new equipment or medical supplies ordered?: Yes Name of Medical supply agency?: [employer] Were you able to get the equipment/medical supplies?: Yes Do you have any questions related to the use of the equipment/supplies?: No  Functional Questionnaire: Do you need assistance with bathing/showering or dressing?: No Do you need assistance with meal preparation?: No Do you need assistance with eating?: No Do you have difficulty maintaining continence: No Do you need assistance with getting out of bed/getting out of a chair/moving?: No Do you have difficulty managing or taking your medications?: No  Follow up appointments reviewed: PCP Follow-up appointment confirmed?: No MD Provider Line Number:[phone] Given: No (The patient states he has too many appointments and he will call) Specialist Hospital Follow-up appointment confirmed?: Yes Date of Specialist follow-up appointment?: 08/04/23 Follow-Up Specialty Provider:: Jamas Lav Do you need transportation to your follow-up appointment?: No Do you understand care options if your condition(s) worsen?: Yes-patient verbalized understanding  SDOH Interventions Today    Flowsheet Row Most Recent Value  SDOH Interventions   Food Insecurity Interventions Intervention Not Indicated  Housing Interventions Intervention Not Indicated   Transportation Interventions Intervention Not Indicated  Utilities Interventions Intervention Not Indicated       Interventions Today    Flowsheet Row Most Recent Value  Chronic Disease   Chronic disease during today's visit Other  [lung cancer]  General Interventions   General Interventions Discussed/Reviewed General Interventions Discussed  Exercise Interventions   Exercise Discussed/Reviewed Physical Activity  Education Interventions   Education Provided Provided Education  Provided Verbal Education On Insurance Plans  Pharmacy Interventions   Pharmacy Dicussed/Reviewed Medications and their functions       TOC Outreach completed. The patient was in a hurry to get off of the phone and did not want to talk. He states he has too many appointments right now to come to see his PCP and he will make an appointment when he can.  Deidre Ala, BSN, RN [place]  [facility] - [employer] [facility] RN Care Manager [phone]

## 2023-07-27 NOTE — Telephone Encounter (Signed)
Spoke with patient and he stated that he isn't sure when he will be able to make it. He stated that he had a lot of cancellation lately and had surgery the other day and had a fall. He stated that he had to call the EMS due to that fall. He stated that he will callback in later to schedule an appointment.

## 2023-07-28 ENCOUNTER — Telehealth: Payer: Self-pay | Admitting: Orthopedic Surgery

## 2023-07-28 DIAGNOSIS — M84552D Pathological fracture in neoplastic disease, left femur, subsequent encounter for fracture with routine healing: Secondary | ICD-10-CM | POA: Diagnosis not present

## 2023-07-28 DIAGNOSIS — C3490 Malignant neoplasm of unspecified part of unspecified bronchus or lung: Secondary | ICD-10-CM | POA: Diagnosis not present

## 2023-07-28 DIAGNOSIS — Z7982 Long term (current) use of aspirin: Secondary | ICD-10-CM | POA: Diagnosis not present

## 2023-07-28 DIAGNOSIS — E119 Type 2 diabetes mellitus without complications: Secondary | ICD-10-CM | POA: Diagnosis not present

## 2023-07-28 DIAGNOSIS — Z794 Long term (current) use of insulin: Secondary | ICD-10-CM | POA: Diagnosis not present

## 2023-07-28 DIAGNOSIS — Z87891 Personal history of nicotine dependence: Secondary | ICD-10-CM | POA: Diagnosis not present

## 2023-07-28 DIAGNOSIS — I10 Essential (primary) hypertension: Secondary | ICD-10-CM | POA: Diagnosis not present

## 2023-07-28 DIAGNOSIS — C7951 Secondary malignant neoplasm of bone: Secondary | ICD-10-CM | POA: Diagnosis not present

## 2023-07-28 DIAGNOSIS — F0781 Postconcussional syndrome: Secondary | ICD-10-CM | POA: Diagnosis not present

## 2023-07-28 NOTE — Telephone Encounter (Signed)
I left voicemail for Almira for return call.

## 2023-07-28 NOTE — Telephone Encounter (Signed)
Sounds good am glad to hear that his pain is not any worse.  He is welcome to reach out to [place] at any point if he feels he is having more pain following surgery.

## 2023-07-28 NOTE — Telephone Encounter (Signed)
Almira from [facility] called regarding patient Jonathan Page. She advised to give her a call back please. [phone]

## 2023-07-28 NOTE — Telephone Encounter (Signed)
FYI  I spoke with Almira. She went in to do assessment with patient today for PT.  She states that he fell on the day that he came home from the hospital. EMS was called, however, patient refused to go back to the hospital with them. Elmira says that the patient told her he had been doing nothing but sitting since the fall. She was able to get him up to walk today with no increased pain. She states that there are no complications with the incision. She went over his list of medications and he told her he was not taking Aspirin. She advised this is on his list and states he will start Aspirin 81mg  today.   She plans to see him 2x/wk x 3 wks.

## 2023-07-31 ENCOUNTER — Telehealth: Payer: Self-pay

## 2023-07-31 ENCOUNTER — Inpatient Hospital Stay: Payer: Medicare HMO

## 2023-07-31 DIAGNOSIS — C7951 Secondary malignant neoplasm of bone: Secondary | ICD-10-CM | POA: Diagnosis not present

## 2023-07-31 DIAGNOSIS — Z510 Encounter for antineoplastic radiation therapy: Secondary | ICD-10-CM | POA: Diagnosis not present

## 2023-07-31 DIAGNOSIS — R531 Weakness: Secondary | ICD-10-CM | POA: Diagnosis not present

## 2023-07-31 DIAGNOSIS — Z79899 Other long term (current) drug therapy: Secondary | ICD-10-CM | POA: Diagnosis not present

## 2023-07-31 DIAGNOSIS — Z5111 Encounter for antineoplastic chemotherapy: Secondary | ICD-10-CM | POA: Diagnosis not present

## 2023-07-31 DIAGNOSIS — C3412 Malignant neoplasm of upper lobe, left bronchus or lung: Secondary | ICD-10-CM | POA: Diagnosis not present

## 2023-07-31 DIAGNOSIS — R5383 Other fatigue: Secondary | ICD-10-CM | POA: Diagnosis not present

## 2023-07-31 DIAGNOSIS — D631 Anemia in chronic kidney disease: Secondary | ICD-10-CM | POA: Diagnosis not present

## 2023-07-31 DIAGNOSIS — Z9049 Acquired absence of other specified parts of digestive tract: Secondary | ICD-10-CM | POA: Diagnosis not present

## 2023-07-31 DIAGNOSIS — Z95828 Presence of other vascular implants and grafts: Secondary | ICD-10-CM

## 2023-07-31 DIAGNOSIS — Z923 Personal history of irradiation: Secondary | ICD-10-CM | POA: Diagnosis not present

## 2023-07-31 DIAGNOSIS — Z5112 Encounter for antineoplastic immunotherapy: Secondary | ICD-10-CM | POA: Diagnosis not present

## 2023-07-31 DIAGNOSIS — N183 Chronic kidney disease, stage 3 unspecified: Secondary | ICD-10-CM | POA: Diagnosis not present

## 2023-07-31 DIAGNOSIS — M899 Disorder of bone, unspecified: Secondary | ICD-10-CM | POA: Diagnosis not present

## 2023-07-31 DIAGNOSIS — J90 Pleural effusion, not elsewhere classified: Secondary | ICD-10-CM | POA: Diagnosis not present

## 2023-07-31 DIAGNOSIS — M25559 Pain in unspecified hip: Secondary | ICD-10-CM | POA: Diagnosis not present

## 2023-07-31 DIAGNOSIS — C778 Secondary and unspecified malignant neoplasm of lymph nodes of multiple regions: Secondary | ICD-10-CM | POA: Diagnosis not present

## 2023-07-31 DIAGNOSIS — R197 Diarrhea, unspecified: Secondary | ICD-10-CM | POA: Diagnosis not present

## 2023-07-31 DIAGNOSIS — J439 Emphysema, unspecified: Secondary | ICD-10-CM | POA: Diagnosis not present

## 2023-07-31 DIAGNOSIS — I129 Hypertensive chronic kidney disease with stage 1 through stage 4 chronic kidney disease, or unspecified chronic kidney disease: Secondary | ICD-10-CM | POA: Diagnosis not present

## 2023-07-31 DIAGNOSIS — N281 Cyst of kidney, acquired: Secondary | ICD-10-CM | POA: Diagnosis not present

## 2023-07-31 DIAGNOSIS — M25519 Pain in unspecified shoulder: Secondary | ICD-10-CM | POA: Diagnosis not present

## 2023-07-31 DIAGNOSIS — E1122 Type 2 diabetes mellitus with diabetic chronic kidney disease: Secondary | ICD-10-CM | POA: Diagnosis not present

## 2023-07-31 DIAGNOSIS — G8918 Other acute postprocedural pain: Secondary | ICD-10-CM | POA: Diagnosis not present

## 2023-07-31 DIAGNOSIS — I700 Atherosclerosis of aorta: Secondary | ICD-10-CM | POA: Diagnosis not present

## 2023-07-31 LAB — CBC WITH DIFFERENTIAL (CANCER CENTER ONLY)
Abs Immature Granulocytes: 0.03 10*3/uL (ref 0.00–0.07)
Basophils Absolute: 0 10*3/uL (ref 0.0–0.1)
Basophils Relative: 0 %
Eosinophils Absolute: 0.2 10*3/uL (ref 0.0–0.5)
Eosinophils Relative: 3 %
HCT: 21.9 % — ABNORMAL LOW (ref 39.0–52.0)
Hemoglobin: 6.9 g/dL — CL (ref 13.0–17.0)
Immature Granulocytes: 0 %
Lymphocytes Relative: 8 %
Lymphs Abs: 0.6 10*3/uL — ABNORMAL LOW (ref 0.7–4.0)
MCH: 31.7 pg (ref 26.0–34.0)
MCHC: 31.5 g/dL (ref 30.0–36.0)
MCV: 100.5 fL — ABNORMAL HIGH (ref 80.0–100.0)
Monocytes Absolute: 0.6 10*3/uL (ref 0.1–1.0)
Monocytes Relative: 7 %
Neutro Abs: 6.5 10*3/uL (ref 1.7–7.7)
Neutrophils Relative %: 82 %
Platelet Count: 108 10*3/uL — ABNORMAL LOW (ref 150–400)
RBC: 2.18 MIL/uL — ABNORMAL LOW (ref 4.22–5.81)
RDW: 22.4 % — ABNORMAL HIGH (ref 11.5–15.5)
WBC Count: 8 10*3/uL (ref 4.0–10.5)
nRBC: 0 % (ref 0.0–0.2)

## 2023-07-31 LAB — CMP (CANCER CENTER ONLY)
ALT: 7 U/L (ref 0–44)
AST: 12 U/L — ABNORMAL LOW (ref 15–41)
Albumin: 3.6 g/dL (ref 3.5–5.0)
Alkaline Phosphatase: 216 U/L — ABNORMAL HIGH (ref 38–126)
Anion gap: 8 (ref 5–15)
BUN: 21 mg/dL (ref 8–23)
CO2: 27 mmol/L (ref 22–32)
Calcium: 9.6 mg/dL (ref 8.9–10.3)
Chloride: 103 mmol/L (ref 98–111)
Creatinine: 1.22 mg/dL (ref 0.61–1.24)
GFR, Estimated: >60 mL/min (ref >60)
Glucose, Bld: 346 mg/dL — ABNORMAL HIGH (ref 70–99)
Potassium: 4.3 mmol/L (ref 3.5–5.1)
Sodium: 138 mmol/L (ref 135–145)
Total Bilirubin: 0.6 mg/dL (ref 0.0–1.2)
Total Protein: 5.9 g/dL — ABNORMAL LOW (ref 6.5–8.1)

## 2023-07-31 MED ORDER — SODIUM CHLORIDE 0.9% FLUSH
10.0000 mL | Freq: Once | INTRAVENOUS | Status: AC
  Administered 2023-07-31: 10 mL

## 2023-07-31 MED ORDER — HEPARIN SOD (PORK) LOCK FLUSH 100 UNIT/ML IV SOLN
500.0000 [IU] | Freq: Once | INTRAVENOUS | Status: AC
  Administered 2023-07-31: 500 [IU]

## 2023-07-31 NOTE — Telephone Encounter (Signed)
 Received lab results of hgb 6.8.  Per Cassie, PA-  Patient needs T&S, ABO and 2 units of blood tomorrow.  Tried to reach patient to inform him and will call him with appts tomorrow morning.

## 2023-07-31 NOTE — Telephone Encounter (Signed)
 CRITICAL VALUE STICKER  CRITICAL VALUE:  Hgb 6.9  RECEIVER (on-site recipient of call): Daneil Dolin, LPN  DATE & TIME NOTIFIED: [phone]  08/01/23  MESSENGER (representative from lab): Mindi Junker  MD NOTIFIED: Wilford Sports  Heilingoetter, PA  TIME OF NOTIFICATION:  15:51  RESPONSE:

## 2023-08-01 ENCOUNTER — Other Ambulatory Visit: Payer: Self-pay | Admitting: Physician Assistant

## 2023-08-01 ENCOUNTER — Other Ambulatory Visit: Payer: Self-pay

## 2023-08-01 ENCOUNTER — Encounter: Payer: Self-pay | Admitting: Internal Medicine

## 2023-08-01 ENCOUNTER — Emergency Department
Admission: EM | Admit: 2023-08-01 | Discharge: 2023-08-02 | Disposition: A | Discharge status: Home / Self Care | Payer: Medicare HMO | Attending: Emergency Medicine | Admitting: Emergency Medicine

## 2023-08-01 DIAGNOSIS — C3490 Malignant neoplasm of unspecified part of unspecified bronchus or lung: Secondary | ICD-10-CM | POA: Diagnosis not present

## 2023-08-01 DIAGNOSIS — D696 Thrombocytopenia, unspecified: Secondary | ICD-10-CM

## 2023-08-01 DIAGNOSIS — K921 Melena: Secondary | ICD-10-CM

## 2023-08-01 DIAGNOSIS — D649 Anemia, unspecified: Secondary | ICD-10-CM

## 2023-08-01 DIAGNOSIS — E1165 Type 2 diabetes mellitus with hyperglycemia: Secondary | ICD-10-CM | POA: Insufficient documentation

## 2023-08-01 DIAGNOSIS — R0609 Other forms of dyspnea: Secondary | ICD-10-CM

## 2023-08-01 DIAGNOSIS — R000 Tachycardia, unspecified: Secondary | ICD-10-CM | POA: Diagnosis not present

## 2023-08-01 DIAGNOSIS — Z79899 Other long term (current) drug therapy: Secondary | ICD-10-CM

## 2023-08-01 DIAGNOSIS — R531 Weakness: Secondary | ICD-10-CM | POA: Diagnosis present

## 2023-08-01 DIAGNOSIS — Z96642 Presence of left artificial hip joint: Secondary | ICD-10-CM | POA: Insufficient documentation

## 2023-08-01 DIAGNOSIS — C7951 Secondary malignant neoplasm of bone: Secondary | ICD-10-CM | POA: Diagnosis present

## 2023-08-01 DIAGNOSIS — R600 Localized edema: Secondary | ICD-10-CM | POA: Insufficient documentation

## 2023-08-01 LAB — BASIC METABOLIC PANEL
Anion gap: 11 (ref 5–15)
BUN: 20 mg/dL (ref 8–23)
CO2: 24 mmol/L (ref 22–32)
Calcium: 9.7 mg/dL (ref 8.9–10.3)
Chloride: 102 mmol/L (ref 98–111)
Creatinine, Ser: 1.27 mg/dL — ABNORMAL HIGH (ref 0.61–1.24)
GFR, Estimated: >60 mL/min (ref >60)
Glucose, Bld: 262 mg/dL — ABNORMAL HIGH (ref 70–99)
Potassium: 4.4 mmol/L (ref 3.5–5.1)
Sodium: 137 mmol/L (ref 135–145)

## 2023-08-01 LAB — CBC WITH DIFFERENTIAL/PLATELET
Abs Immature Granulocytes: 0.04 10*3/uL (ref 0.00–0.07)
Basophils Absolute: 0 10*3/uL (ref 0.0–0.1)
Basophils Relative: 0 %
Eosinophils Absolute: 0.3 10*3/uL (ref 0.0–0.5)
Eosinophils Relative: 4 %
HCT: 21.9 % — ABNORMAL LOW (ref 39.0–52.0)
Hemoglobin: 6.8 g/dL — ABNORMAL LOW (ref 13.0–17.0)
Immature Granulocytes: 1 %
Lymphocytes Relative: 11 %
Lymphs Abs: 0.9 10*3/uL (ref 0.7–4.0)
MCH: 31.5 pg (ref 26.0–34.0)
MCHC: 31.1 g/dL (ref 30.0–36.0)
MCV: 101.4 fL — ABNORMAL HIGH (ref 80.0–100.0)
Monocytes Absolute: 0.6 10*3/uL (ref 0.1–1.0)
Monocytes Relative: 7 %
Neutro Abs: 6.5 10*3/uL (ref 1.7–7.7)
Neutrophils Relative %: 77 %
Platelets: 121 10*3/uL — ABNORMAL LOW (ref 150–400)
RBC: 2.16 MIL/uL — ABNORMAL LOW (ref 4.22–5.81)
RDW: 22.5 % — ABNORMAL HIGH (ref 11.5–15.5)
Smear Review: NORMAL
WBC: 8.3 10*3/uL (ref 4.0–10.5)
nRBC: 0.2 % (ref 0.0–0.2)

## 2023-08-01 LAB — TROPONIN I (HIGH SENSITIVITY): Troponin I (High Sensitivity): 8 ng/L (ref <18)

## 2023-08-01 LAB — ABO/RH: ABO/RH(D): O POS

## 2023-08-01 LAB — PREPARE RBC (CROSSMATCH)

## 2023-08-01 MED ORDER — SODIUM CHLORIDE 0.9 % IV SOLN
10.0000 mL/h | Freq: Once | INTRAVENOUS | Status: AC
  Administered 2023-08-02: 10 mL/h via INTRAVENOUS

## 2023-08-01 NOTE — ED Provider Notes (Signed)
 [facility] Provider Note    Event Date/Time   First MD Initiated Contact with Patient 08/01/23 2202     (approximate)   History   Weakness and Abnormal Lab   HPI  Jonathan Page is a 73 y.o. male with history of non-small cell lung cancer on chemo and radiation presenting with anemia.  Patient states that he has had generalized weakness.  No chest pain.  Has some shortness of breath but has chronic shortness of breath due to lung cancer and this is at baseline.  Was told to come in because his hemoglobin was low.  Denies recent colonoscopy.  States no blood in his stool.  No abdominal pain.  No fever or chills.  No hematuria or hematemesis.    On independent chart review patient had a left hip IM nail placed on February 11.  He states no issues with the surgical site.     Physical Exam   Triage Vital Signs: ED Triage Vitals  Encounter Vitals Group     BP 08/01/23 1605 111/68     Systolic BP Percentile --      Diastolic BP Percentile --      Pulse Rate 08/01/23 1605 (!) 106     Resp 08/01/23 1605 16     Temp 08/01/23 1605 98.3 F (36.8 C)     Temp Source 08/01/23 1605 Oral     SpO2 08/01/23 1605 100 %     Weight 08/01/23 1605 150 lb (68 kg)     Height 08/01/23 1605 6\' 1"  (1.854 m)     Head Circumference --      Peak Flow --      Pain Score 08/01/23 1616 0     Pain Loc --      Pain Education --      Exclude from Growth Chart --     Most recent vital signs: Vitals:   08/02/23 0430 08/02/23 0450  BP: 124/84 135/85  Pulse: 93 94  Resp: 16 20  Temp:    SpO2: 98% 98%     General: Awake, no distress.  CV:  Good peripheral perfusion.  Resp:  Normal effort.  Abd:  No distention.  Soft nontender Other:  Surgical sites to the left hip and thigh are intact, no purulent drainage, no erythema or swelling.  Rectal exam was done with chaperone, brown stool in rectal vault but is Hemoccult positive.   ED Results / Procedures / Treatments    Labs (all labs ordered are listed, but only abnormal results are displayed) Labs Reviewed  BASIC METABOLIC PANEL - Abnormal; Notable for the following components:      Result Value   Glucose, Bld 262 (*)    Creatinine, Ser 1.27 (*)    All other components within normal limits  CBC WITH DIFFERENTIAL/PLATELET - Abnormal; Notable for the following components:   RBC 2.16 (*)    Hemoglobin 6.8 (*)    HCT 21.9 (*)    MCV 101.4 (*)    RDW 22.5 (*)    Platelets 121 (*)    All other components within normal limits  TYPE AND SCREEN  PREPARE RBC (CROSSMATCH)  ABO/RH  PREPARE RBC (CROSSMATCH)  TROPONIN I (HIGH SENSITIVITY)      PROCEDURES:  Critical Care performed: Yes, see critical care procedure note(s)  .Critical Care  Performed by: Claybon Jabs, MD Authorized by: Claybon Jabs, MD   Critical care provider statement:  Critical care time (minutes):  35   Critical care was necessary to treat or prevent imminent or life-threatening deterioration of the following conditions:  Circulatory failure   Critical care was time spent personally by me on the following activities:  Development of treatment plan with patient or surrogate, discussions with consultants, evaluation of patient's response to treatment, examination of patient, ordering and review of laboratory studies, ordering and review of radiographic studies, ordering and performing treatments and interventions, pulse oximetry, re-evaluation of patient's condition and review of old charts    MEDICATIONS ORDERED IN ED: Medications  0.9 %  sodium chloride infusion (0 mL/hr Intravenous Stopped 08/02/23 0452)     IMPRESSION / MDM / ASSESSMENT AND PLAN / ED COURSE  I reviewed the triage vital signs and the nursing notes.                              Differential diagnosis includes, but is not limited to, symptomatic anemia, GI bleed, postsurgical changes, no palpable hematoma or swelling to the surgical site, this is less  likely.  His rectal exam does have Hemoccult positive blood.  Labs were obtained out of triage, will follow-up on his EKG.  Independent review of labs are below, he is anemic and will need transfusion.  Consented patient and discussed the risk and benefits of blood transfusion and he is agreeable to plan.  Also discussed admission which he is agreeable to.  Will give him 1 unit of packed RBCs.  Patient's presentation is most consistent with acute presentation with potential threat to life or bodily function.  Was initially planning to admit this patient but after discussion with hospitalist, since he is very plugged into the system and his hemoglobin has been stable, no melena or hematochezia, he is stable for outpatient follow-up.  Has not had a colonoscopy before.  Discussed with the patient and he would like to go home after transfusion.  Was initially going to transfuse him 1 unit but looking in the chart, oncology wanted him to get 2 units.  Will order 2 units of packed RBCs.  He will be able to be discharged home after.  Looked up GI on-call and provided number for them to call to schedule follow-up.  Also instructed patient to speak to his primary care doctor about GI referral.  1:06 PM on 08/02/2023: Saw hospitalist note and chat about mild edema to right lower extremity without pain. Called pt but no answer, was able to reach his wife who states that he has mild swelling to his R ankle but not past that. No pain to the RLE. She states that he started having some edema after he was switched to a different medication and thinks the swelling is from that. Instructed wife to have pt come back to the ED or go to his doctor's office for a DVT US if he gets swelling past the ankle or RLE pain/aching. She verbalized agreement with the plan. States he has an upcoming doctor's appointment will have him discuss with his physician as well.   Clinical Course as of 08/02/23 1306  Tue Aug 01, 2023  2228  Independent review of labs, his initial troponin is negative, electrolytes not severely deranged, creatinine is mildly elevated but this is not significant change compared to prior, his hemoglobin is 6.8 today which is downtrending compared to prior. [TT]    Clinical Course User Index [TT] Jodie Echevaria Franchot Erichsen, MD  FINAL CLINICAL IMPRESSION(S) / ED DIAGNOSES   Final diagnoses:  Symptomatic anemia  Blood in stool     Rx / DC Orders   ED Discharge Orders     None        Note:  This document was prepared using Dragon voice recognition software and may include unintentional dictation errors.    Claybon Jabs, MD 08/01/23 2313    Claybon Jabs, MD 08/02/23 [phone]

## 2023-08-01 NOTE — Telephone Encounter (Signed)
 Tried to contact patient. Spoke with patients spouse, [name]. Informed spouse that patients hgb is 6.9 and a blood transfusion is needed.  Offered patient to come in early today and receive blood, but spouse states that she works M-F 6-2.  Patients spouse stated that the patient is very weak.  Per Cassie, PA- Patient can be seen in the ER.  Patient's spouse stated that she is going to take the patient to [facility] ER today when she gets off of work. Informed patients spouse to let us know if theres any other concerns or questions.

## 2023-08-01 NOTE — Assessment & Plan Note (Addendum)
 On antineoplastic chemotherapy Follow-up with oncology

## 2023-08-01 NOTE — ED Triage Notes (Signed)
 Pt to ED for "low blood" called by doctor today from labs drawn 2 days ago. Has had some generalized weakness for about 1 week. Had hip surgery (pin put in) about 1 week ago. Denies pain or swelling to area. No falls. Uses walker and cane.

## 2023-08-01 NOTE — Assessment & Plan Note (Signed)
 Chronic dyspnea S/p left hip repair on 07/25/2023 Consider further workup with D-dimer, lower extremity venous Doppler if appropriate-discussed with ED provider

## 2023-08-01 NOTE — Assessment & Plan Note (Signed)
 Thrombocytopenia On antineoplastic chemotherapy for NSCLC No reports of melena or hematochezia. Suspect anemia is related to chemotherapy Stool for occult blood positive in ED but patient has a history of hemorrhoids and reports seeing no blood in stool Hemoglobin stable at 6.8-6.9 past 24 hours and baseline hemoglobin about 8 Recommend transfusion of 1 unit PRBC as initiated in the ED with recheck hemoglobin Patient with chronic dyspnea and otherwise feels at his baseline Following transfusion can refer back to PCP to arrange for outpatient GI follow-up as no active bleeding suspected

## 2023-08-01 NOTE — ED Provider Triage Note (Addendum)
 Emergency Medicine Provider Triage Evaluation Note  Jonathan Page , a 73 y.o. male  was evaluated in triage.  Pt complains of hypotension patient was sent by his doctor. Been feeling weak.   Patient is currently being treated for lung cancer, finished radiation last Friday. Review of Systems  Positive: Weakness, lightheaded, SOB at baseline Negative:  Dizziness, cough, congestion  Physical Exam  Ht 6\' 1"  (1.854 m)   Wt 68 kg   BMI 19.79 kg/m  Gen:   Awake, no distress   Resp:  Normal effort  MSK:   Moves extremities without difficulty  Other:    Medical Decision Making  Medically screening exam initiated at 4:07 PM.  Appropriate orders placed.  Leontine Locket was informed that the remainder of the evaluation will be completed by another provider, this initial triage assessment does not replace that evaluation, and the importance of remaining in the ED until their evaluation is complete.     Cameron Ali, PA-C 08/01/23 1610    Cameron Ali, PA-C 08/01/23 1612

## 2023-08-01 NOTE — Assessment & Plan Note (Signed)
 Continue home insulin, glipizide and Jardiance Follow-up with PCP

## 2023-08-01 NOTE — Consult Note (Signed)
 Initial Consultation Note   Patient: Jonathan Page VOZ:[id number] DOB: 07/25/1950 PCP: Doreene Nest, NP DOA: 08/01/2023 DOS: the patient was seen and examined on 08/01/2023 Primary service: Claybon Jabs, MD  Referring physician: Dr Jodie Echevaria Reason for consult: anemia requiring transfusion  Assessment/Plan: Assessment and Plan: Anemia Thrombocytopenia On antineoplastic chemotherapy for NSCLC No reports of melena or hematochezia. Suspect anemia is related to chemotherapy Stool for occult blood positive in ED but patient has a history of hemorrhoids and reports seeing no blood in stool Hemoglobin stable at 6.8-6.9 past 24 hours and baseline hemoglobin about 8 Recommend transfusion of 1 unit PRBC as initiated in the ED with recheck hemoglobin Patient with chronic dyspnea and otherwise feels at his baseline Following transfusion can refer back to PCP to arrange for outpatient GI follow-up as no active bleeding suspected  Leg edema, right Chronic dyspnea S/p left hip repair on 07/25/2023 Consider further workup with D-dimer, lower extremity venous Doppler if appropriate-discussed with ED provider  Uncontrolled type 2 diabetes mellitus with hyperglycemia, without long-term current use of insulin (HCC) Continue home insulin, glipizide and Jardiance Follow-up with PCP  Non-small cell lung cancer metastatic to bone [facility]) On antineoplastic chemotherapy Follow-up with oncology       TRH will sign off at present, please call us again when needed.  HPI: Jonathan Page is a 73 y.o. male with past medical history of DM, HTN, former tobacco and alcohol use, postconcussive syndrome chronic tinnitus, CKD 3B, NSCLC on chemo and radiation, s/p recent left hip repair with intramedullary nail on 07/25/2023, ambulate with cane and walker, who was sent in for evaluation for anemia.  Patient denies palpitations, lightheadedness.  Does have chronic shortness of breath related to his lung cancer.  Denies  lower extremity pain.  Endorses mild swelling in the right ankle..  Outpatient hemoglobin was 6.9 on 2/17, down from 7.5 a week prior.  He denies blood in stool dark stool.  Has a history of hemorrhoids ED course and data review: Mild tachycardia to 106 with otherwise normal vitals. Workup with hemoglobin 6.8 platelets 121.(Baseline hemoglobin around 8.5).  Glucose 262, Crea 1.27 which is his baseline, troponin 8 Stool for occult blood was positive.  The ED provider Patient ordered a unit of PRBC. Hospitalist consulted for admission however upon review, patient with stable hemoglobin in the past 24 hours and overall in the past week with no active reported rectal bleeding.  Positive Hemoccult can be explained by presence of hemorrhoids.  Discussed with patient and he is agreeable with outpatient GI follow-up. If no additional concerning issues, I do not think there is a need to admit patient for anemia.  Review of Systems: As mentioned in the history of present illness. All other systems reviewed and are negative. Past Medical History:  Diagnosis Date   Allergy    Cancer (HCC)    Cellulitis 02/21/2018   Concussion syndrome    Diabetes (HCC)    Frequent headaches    Hypertension    Past Surgical History:  Procedure Laterality Date   APPENDECTOMY  1960   BRONCHIAL BIOPSY  06/12/2023   Procedure: BRONCHIAL BIOPSIES;  Surgeon: Josephine Igo, DO;  Location: [facility];  Service: Cardiopulmonary;;   BRONCHIAL BRUSHINGS  06/12/2023   Procedure: BRONCHIAL BRUSHINGS;  Surgeon: Josephine Igo, DO;  Location: [facility];  Service: Cardiopulmonary;;   COLONOSCOPY     FEMUR IM NAIL Left 07/25/2023   Procedure: LEFT HIP INTRAMEDULLARY HIP SCREW;  Surgeon: August Saucer,  Corrie Mckusick, MD;  [facility];  Service: Orthopedics;  Laterality: Left;   HIP SURGERY Left 07/25/2023   metal pin placed in L hip area   IR IMAGING GUIDED PORT INSERTION  07/13/2023   VIDEO BRONCHOSCOPY Bilateral 06/12/2023    Procedure: VIDEO BRONCHOSCOPY;  Surgeon: Josephine Igo, DO;  Location: [facility]OSCOPY;  Service: Cardiopulmonary;  Laterality: Bilateral;   WISDOM TOOTH EXTRACTION     Social History:  reports that he quit smoking about 4 years ago. His smoking use included cigarettes and cigars. He started smoking about 34 years ago. He has a 15 pack-year smoking history. He has never used smokeless tobacco. He reports current alcohol use of about 6.0 standard drinks of alcohol per week. He reports that he does not use drugs.  No Known Allergies  Family History  Problem Relation Age of Onset   Diabetes Father    Diabetes Paternal Grandfather    Diabetes Paternal Grandmother    Colon cancer Neg Hx    Crohn's disease Neg Hx    Rectal cancer Neg Hx    Stomach cancer Neg Hx    Esophageal cancer Neg Hx     Prior to Admission medications   Medication Sig Start Date End Date Taking? Authorizing Provider  aspirin EC 81 MG tablet Take 81 mg by mouth daily.    [provider]  Blood Glucose Monitoring Suppl (ACCU-CHEK AVIVA PLUS) w/Device KIT Check blood sugar before breakfast, lunch and bedtime and as directed. Dx E11.65 07/13/17   Doreene Nest, NP  docusate sodium (COLACE) 100 MG capsule Take 1 capsule (100 mg total) by mouth 2 (two) times daily. 07/26/23   Magnant, Joycie Peek, PA-C  empagliflozin (JARDIANCE) 10 MG TABS tablet Take 1 tablet (10 mg total) by mouth daily before breakfast. 05/29/23   Karie Schwalbe, MD  folic acid (FOLVITE) 1 MG tablet Take 1 mg by mouth in the morning.    [provider]  gabapentin (NEURONTIN) 300 MG capsule Take 1 capsule (300 mg total) by mouth at bedtime. Patient taking differently: Take 300 mg by mouth in the morning. 07/03/23   Heilingoetter, Cassandra L, PA-C  glipiZIDE (GLUCOTROL XL) 10 MG 24 hr tablet Take 1 tablet (10 mg total) by mouth daily with breakfast. for diabetes. 05/29/23   Karie Schwalbe, MD  glucose blood (ACCU-CHEK AVIVA PLUS)  test strip Check blood sugar before breakfast, lunch and bedtime and as directed. Dx E11.65 07/13/17   Doreene Nest, NP  insulin NPH-regular Human (70-30) 100 UNIT/ML injection Inject 5 Units into the skin daily with breakfast.    [provider]  Lancets (ACCU-CHEK SOFT TOUCH) lancets Check blood sugar before breakfast, lunch and bedtime and as directed. Dx E11.65 07/13/17   Doreene Nest, NP  lidocaine-prilocaine (EMLA) cream Apply 1 Application topically as needed. 06/29/23   Si Gaul, MD  lisinopril (ZESTRIL) 10 MG tablet Take 1 tablet (10 mg total) by mouth daily. for blood pressure. 07/11/23   Doreene Nest, NP  methocarbamol (ROBAXIN) 500 MG tablet Take 1 tablet (500 mg total) by mouth every 8 (eight) hours as needed for muscle spasms. 07/26/23   Magnant, Charles L, PA-C  ondansetron (ZOFRAN) 8 MG tablet Take 8 mg by mouth every 8 (eight) hours as needed for nausea or vomiting.    [provider]  oxyCODONE (OXY IR/ROXICODONE) 5 MG immediate release tablet Take 1 tablet (5 mg total) by mouth every 4 (four) hours as needed  for moderate pain (pain score 4-6). 07/26/23   Magnant, Joycie Peek, PA-C  prochlorperazine (COMPAZINE) 10 MG tablet Take 1 tablet (10 mg total) by mouth every 6 (six) hours as needed for nausea or vomiting. 06/29/23   Si Gaul, MD    Physical Exam: Vitals:   08/01/23 1605  BP: 111/68  Pulse: (!) 106  Resp: 16  Temp: 98.3 F (36.8 C)  TempSrc: Oral  SpO2: 100%  Weight: 68 kg  Height: 6\' 1"  (1.854 m)   Physical Exam Vitals and nursing note reviewed.  Constitutional:      General: He is not in acute distress. HENT:     Head: Normocephalic and atraumatic.  Cardiovascular:     Rate and Rhythm: Normal rate and regular rhythm.     Heart sounds: Normal heart sounds.  Pulmonary:     Effort: Pulmonary effort is normal.     Breath sounds: Normal breath sounds.  Abdominal:     Palpations: Abdomen is soft.     Tenderness:  There is no abdominal tenderness.  Musculoskeletal:     Right lower leg: Edema present.  Neurological:     Mental Status: Mental status is at baseline.     Data Reviewed: Relevant notes from primary care and specialist visits, past discharge summaries as available in EHR, including Care Everywhere. Prior diagnostic testing as pertinent to current admission diagnoses Updated medications and problem lists for reconciliation ED course, including vitals, labs, imaging, treatment and response to treatment Triage notes, nursing and pharmacy notes and ED provider's notes Notable results as noted in HPI   Family Communication:  Primary team communication: Dr Jodie Echevaria Thank you very much for involving [place] in the care of your patient.  Author: Andris Baumann, MD 08/01/2023 11:00 PM  For on call review [email or web address].

## 2023-08-01 NOTE — Discharge Instructions (Addendum)
 Please follow-up with your primary care doctor for further management of symptoms.  Please call the gastroenterologist number to set up an appointment to get a colonoscopy scheduled.

## 2023-08-03 ENCOUNTER — Telehealth: Payer: Self-pay

## 2023-08-03 LAB — TYPE AND SCREEN
ABO/RH(D): O POS
Antibody Screen: NEGATIVE
Unit division: 0
Unit division: 0

## 2023-08-03 LAB — BPAM RBC
Blood Product Expiration Date: 202503212359
ISSUE DATE / TIME: 202502182359
ISSUE DATE / TIME: 202502190232
ISSUE DATE / TIME: 202503212359
Unit Type and Rh: 202503212359
Unit Type and Rh: 202503212359
Unit Type and Rh: 5100
Unit Type and Rh: 5100

## 2023-08-03 NOTE — Telephone Encounter (Signed)
 Patient was identified as falling into the [place] Measure - Diabetes.   Patient was: Left voicemail to schedule with primary care provider.  Sending my chart message to patient to set up visit. Was just discharged from hospital 08/01/22.

## 2023-08-03 NOTE — Addendum Note (Signed)
 Encounter addended by: Edward Qualia on: 08/03/2023 12:50 PM  Actions taken: Imaging Exam ended

## 2023-08-04 ENCOUNTER — Encounter: Payer: Medicare HMO | Admitting: Surgical

## 2023-08-04 ENCOUNTER — Other Ambulatory Visit: Payer: Self-pay | Admitting: Physician Assistant

## 2023-08-04 ENCOUNTER — Telehealth: Payer: Self-pay

## 2023-08-04 ENCOUNTER — Telehealth: Payer: Self-pay | Admitting: Orthopedic Surgery

## 2023-08-04 DIAGNOSIS — D649 Anemia, unspecified: Secondary | ICD-10-CM

## 2023-08-04 MED FILL — Fosaprepitant Dimeglumine For IV Infusion 150 MG (Base Eq): INTRAVENOUS | Qty: 5 | Status: AC

## 2023-08-04 NOTE — Telephone Encounter (Signed)
 Patient's wife called. Says she can not get him in the car. Would like an appointment next week.

## 2023-08-04 NOTE — Telephone Encounter (Signed)
 Transition Care Management Unsuccessful Follow-up Telephone Call  Date of discharge and from where:  08/01/24 [facility]  Attempts:  1st Attempt  Reason for unsuccessful TCM follow-up call:  Left voice message

## 2023-08-04 NOTE — Progress Notes (Signed)
 [facility] OFFICE PROGRESS NOTE  Doreene Nest, NP [address] [place] [address]  DIAGNOSIS: Stage IV (T3, N2, M1 C) non-small cell lung cancer, adenocarcinoma presented with large left upper lobe lung mass with left hilar and mediastinal invasion in addition to thoracic inlet lymphadenopathy as well as several metastatic bone lesions involving the spine, ribs as well as the right iliac bone diagnosed in December 2024.   Molecular studies by [id number] showed no actionable mutations and PD-L1 expression was negative.  PRIOR THERAPY: 1) radiation to L4 and L5 to the care of Dr. Kathrynn Running. Final treatment expected on 07/21/23  2) Left hip stress/pathologic intertrochanteric fracture open reduction and internal fixation with intramedullary hip screw under the care of Dr. August Saucer on 07/25/23  CURRENT THERAPY:  1) Palliative systemic chemotherapy and immunotherapy with carboplatin for an AUC of 5, Taxol 175 mg/m, and Libtayo IV every 3 weeks with Neulasta support.  (Not a candidate for Alimta due to CKD).  First dose on 07/03/23.  Status post 1 cycle.   INTERVAL HISTORY: Jonathan Page 73 y.o. male returns to the clinic today for a follow-up visit.  The patient was last seen in clinic by Dr. Arbutus Ped on 07/24/2023.  In summary the patient was recently diagnosed with stage IV lung cancer.  He had a metastatic bone lesion to the hip and was at risk for pathological fracture.  Therefore, the patient underwent surgery under the care of Dr. August Saucer on 07/25/2023.  He tolerated this fair except he is having some post-op pain. He missed his follow up last week on 2/21 with his surgeon and needs to reschedule this. He is currently undergoing PT once a week at home. However, he makes sure he does the recommended exercises daily.   In the interval since last being seen, he has been having worsening anemia.  He was called to return to the clinic for blood transfusions but was unable to come to the  clinic during any of the available hours due to his wife's work schedule.  Therefore, he subsequently went to the emergency room and received 1 unit of blood.  His hemoccult was positive for blood but it was felt that this could be explained due to his hemorrhoids so outpatient follow-up was recommended with GI.   Since receiving a blood transfusion his energy is "a little better".  Denies any visible bleeding or bruising. He is not taking an iron supplement.   He is following with palliative care and has an appointment next on 08/21/23.   He denies any fever, chills, or night sweats. He states his appetite is "100%". His weight is stable. He denies cough. He denies chest pain except under his left shoulder when he lifts his arm. He denies  hemoptysis.  He denies any shortness of breath. He states his "breathing is better". He reported he did have some mild shortness when he was anemic.  Denies any nausea or vomiting.  He denies diarrhea or constipation. Denies any rashes or skin changes.  Denies any headache or visual changes.  He is here today for evaluation and repeat blood work before considering undergoing cycle #2.   MEDICAL HISTORY: Past Medical History:  Diagnosis Date   Allergy    Cancer (HCC)    Cellulitis 02/21/2018   Concussion syndrome    Diabetes (HCC)    Frequent headaches    Hypertension     ALLERGIES:  has no known allergies.  MEDICATIONS:  Current Outpatient Medications  Medication Sig Dispense Refill   aspirin EC 81 MG tablet Take 81 mg by mouth daily.     Blood Glucose Monitoring Suppl (ACCU-CHEK AVIVA PLUS) w/Device KIT Check blood sugar before breakfast, lunch and bedtime and as directed. Dx E11.65 1 kit 0   docusate sodium (COLACE) 100 MG capsule Take 1 capsule (100 mg total) by mouth 2 (two) times daily. 10 capsule 0   empagliflozin (JARDIANCE) 10 MG TABS tablet Take 1 tablet (10 mg total) by mouth daily before breakfast. 30 tablet 3   folic acid (FOLVITE) 1 MG  tablet Take 1 mg by mouth in the morning.     gabapentin (NEURONTIN) 300 MG capsule Take 1 capsule (300 mg total) by mouth at bedtime. (Patient taking differently: Take 300 mg by mouth in the morning.) 30 capsule 1   glipiZIDE (GLUCOTROL XL) 10 MG 24 hr tablet Take 1 tablet (10 mg total) by mouth daily with breakfast. for diabetes. 90 tablet 3   glucose blood (ACCU-CHEK AVIVA PLUS) test strip Check blood sugar before breakfast, lunch and bedtime and as directed. Dx E11.65 300 each 1   insulin NPH-regular Human (70-30) 100 UNIT/ML injection Inject 5 Units into the skin daily with breakfast.     Lancets (ACCU-CHEK SOFT TOUCH) lancets Check blood sugar before breakfast, lunch and bedtime and as directed. Dx E11.65 300 each 1   lidocaine-prilocaine (EMLA) cream Apply 1 Application topically as needed. 30 g 1   lisinopril (ZESTRIL) 10 MG tablet Take 1 tablet (10 mg total) by mouth daily. for blood pressure. 90 tablet 0   methocarbamol (ROBAXIN) 500 MG tablet Take 1 tablet (500 mg total) by mouth every 8 (eight) hours as needed for muscle spasms. 30 tablet 0   ondansetron (ZOFRAN) 8 MG tablet Take 8 mg by mouth every 8 (eight) hours as needed for nausea or vomiting.     oxyCODONE (OXY IR/ROXICODONE) 5 MG immediate release tablet Take 1 tablet (5 mg total) by mouth every 4 (four) hours as needed for moderate pain (pain score 4-6). 30 tablet 0   prochlorperazine (COMPAZINE) 10 MG tablet Take 1 tablet (10 mg total) by mouth every 6 (six) hours as needed for nausea or vomiting. 30 tablet 0   No current facility-administered medications for this visit.    SURGICAL HISTORY:  Past Surgical History:  Procedure Laterality Date   APPENDECTOMY  1960   BRONCHIAL BIOPSY  06/12/2023   Procedure: BRONCHIAL BIOPSIES;  Surgeon: Josephine Igo, DO;  Location: [facility] ENDOSCOPY;  Service: Cardiopulmonary;;   BRONCHIAL BRUSHINGS  06/12/2023   Procedure: BRONCHIAL BRUSHINGS;  Surgeon: Josephine Igo, DO;  Location: [facility]  ENDOSCOPY;  Service: Cardiopulmonary;;   COLONOSCOPY     FEMUR IM NAIL Left 07/25/2023   Procedure: LEFT HIP INTRAMEDULLARY HIP SCREW;  Surgeon: Cammy Copa, MD;  Location: [facility] OR;  Service: Orthopedics;  Laterality: Left;   HIP SURGERY Left 07/25/2023   metal pin placed in L hip area   IR IMAGING GUIDED PORT INSERTION  07/13/2023   VIDEO BRONCHOSCOPY Bilateral 06/12/2023   Procedure: VIDEO BRONCHOSCOPY;  Surgeon: Josephine Igo, DO;  Location: [facility] ENDOSCOPY;  Service: Cardiopulmonary;  Laterality: Bilateral;   WISDOM TOOTH EXTRACTION      REVIEW OF SYSTEMS:   Review of Systems  Constitutional: Positive for stable fatigue and generalized weakness.  Negative for appetite change, chills, fever and unexpected weight change.  HENT: Negative for mouth sores, nosebleeds, sore throat and trouble swallowing.  Eyes: Negative for eye problems and icterus.  Respiratory: Negative for cough, hemoptysis, shortness of breath and wheezing.   Cardiovascular: Negative for chest pain and leg swelling.  Gastrointestinal: Negative for abdominal pain, constipation, diarrhea, nausea and vomiting.  Genitourinary: Negative for bladder incontinence, difficulty urinating, dysuria, frequency and hematuria.   Musculoskeletal: Positive for hip pain.  Positive for shoulder pain.  Negative for back pain, gait problem, neck pain and neck stiffness.  Skin: Negative for itching and rash.  Neurological: Negative for dizziness, extremity weakness, gait problem, headaches, light-headedness and seizures.  Hematological: Negative for adenopathy. Does not bruise/bleed easily.  Psychiatric/Behavioral: Negative for confusion, depression and sleep disturbance. The patient is not nervous/anxious.     PHYSICAL EXAMINATION:  There were no vitals taken for this visit.  ECOG PERFORMANCE STATUS: 2  Physical Exam  Constitutional: Oriented to person, place, and time and thin appearing male, and in no distress.   HENT:   Head: Normocephalic and atraumatic.  Mouth/Throat: Oropharynx is clear and moist. No oropharyngeal exudate.  Eyes: Conjunctivae are normal. Right eye exhibits no discharge. Left eye exhibits no discharge. No scleral icterus.  Neck: Normal range of motion. Neck supple.  Cardiovascular: Normal rate, regular rhythm, normal heart sounds and intact distal pulses.   Pulmonary/Chest: Effort normal and breath sounds normal. No respiratory distress. No wheezes. No rales.  Abdominal: Soft. Bowel sounds are normal. Exhibits no distension and no mass. There is no tenderness.  Musculoskeletal: Normal range of motion. Exhibits no edema.  Lymphadenopathy:    No cervical adenopathy.  Neurological: Alert and oriented to person, place, and time. Exhibits muscle wasting.  Examined in the wheelchair.  Has a cane and walker at home if needed. Skin: Skin is warm and dry. No rash noted. Not diaphoretic. No erythema. No pallor.  Psychiatric: Mood, memory and judgment normal.  Vitals reviewed.  LABORATORY DATA: Lab Results  Component Value Date   WBC 8.3 08/01/2023   HGB 6.8 (L) 08/01/2023   HCT 21.9 (L) 08/01/2023   MCV 101.4 (H) 08/01/2023   PLT 121 (L) 08/01/2023      Chemistry      Component Value Date/Time   NA 137 08/01/2023 1607   K 4.4 08/01/2023 1607   CL 102 08/01/2023 1607   CO2 24 08/01/2023 1607   BUN 20 08/01/2023 1607   CREATININE 1.27 (H) 08/01/2023 1607   CREATININE 1.22 07/31/2023 1530      Component Value Date/Time   CALCIUM 9.7 08/01/2023 1607   ALKPHOS 216 (H) 07/31/2023 1530   AST 12 (L) 07/31/2023 1530   ALT 7 07/31/2023 1530   BILITOT 0.6 07/31/2023 1530       RADIOGRAPHIC STUDIES:  DG FEMUR PORT MIN 2 VIEWS LEFT Result Date: 07/25/2023 CLINICAL DATA:  Pathologic left hip fracture status post ORIF, metastatic lung cancer EXAM: LEFT FEMUR PORTABLE 2 VIEWS COMPARISON:  07/13/2023, 07/19/2023 FINDINGS: Frontal and lateral views of the left femur demonstrate interval  placement of intramedullary rod and 2 proximal dynamic screws traversing the pathologic left femoral neck fracture seen previously. Alignment is near anatomic. Postsurgical changes in the overlying soft tissues. IMPRESSION: 1. ORIF of a pathologic left femoral neck fracture as above. Anatomic alignment. Electronically Signed   By: Sharlet Salina M.D.   On: 07/25/2023 22:37   DG FEMUR MIN 2 VIEWS LEFT Result Date: 07/25/2023 CLINICAL DATA:  Elective surgery. EXAM: LEFT FEMUR 2 VIEWS COMPARISON:  Preoperative imaging. FINDINGS: Eight fluoroscopic spot views of the left femur submitted from  the operating room. Femoral intramedullary nail with trans trochanteric screw fixation. Fluoroscopy time 1 minutes 1 seconds. Dose 9.4 mGy. IMPRESSION: Procedural fluoroscopy during left femur IM nail. Electronically Signed   By: Narda Rutherford M.D.   On: 07/25/2023 18:20   DG C-Arm 1-60 Min-No Report Result Date: 07/25/2023 Fluoroscopy was utilized by the requesting physician.  No radiographic interpretation.   DG C-Arm 1-60 Min-No Report Result Date: 07/25/2023 Fluoroscopy was utilized by the requesting physician.  No radiographic interpretation.   XR FEMUR, MIN 2 VIEWS RIGHT Result Date: 07/19/2023 AP lateral radiographs right femur reviewed.  No visible lesions in the proximal femur.  No acute fracture.  XR FEMUR MIN 2 VIEWS LEFT Result Date: 07/19/2023 AP lateral radiographs left femur reviewed.  Osteolytic lesion in the proximal femur is noted.  This extends to the superior femoral neck.  Definite greater trochanteric involvement is noted.  No subtrochanteric involvement on plain radiographs.  There is some lytic component to the distal medial femoral condyle which is very subtle.  IR IMAGING GUIDED PORT INSERTION Result Date: 07/13/2023 INDICATION: Port-A-Cath placement.  History of metastatic lung cancer EXAM: IMPLANTED PORT A CATH PLACEMENT WITH ULTRASOUND AND FLUOROSCOPIC GUIDANCE MEDICATIONS: None;  ANESTHESIA/SEDATION: Moderate (conscious) sedation was employed during this procedure. A total of Versed 1.5 mg and Fentanyl 75 mcg was administered intravenously. Moderate Sedation Time: 20 minutes. The patient's level of consciousness and vital signs were monitored continuously by radiology nursing throughout the procedure under my direct supervision. FLUOROSCOPY TIME:  Fluoroscopic dose; 0 mGy COMPLICATIONS: None immediate. PROCEDURE: The procedure, risks, benefits, and alternatives were explained to the patient. Questions regarding the procedure were encouraged and answered. The patient understands and consents to the procedure. The RIGHT neck and chest were prepped with chlorhexidine in a sterile fashion, and a sterile drape was applied covering the operative field. Maximum barrier sterile technique with sterile gowns and gloves were used for the procedure. A timeout was performed prior to the initiation of the procedure. Local anesthesia was provided with 1% lidocaine with epinephrine. After creating a small venotomy incision, a micropuncture kit was utilized to access the internal jugular vein under direct, real-time ultrasound guidance. Ultrasound image documentation was performed. The microwire was kinked to measure appropriate catheter length. A subcutaneous port pocket was then created along the upper chest wall utilizing a combination of sharp and blunt dissection. The pocket was irrigated with sterile saline. A single lumen power injectable port was chosen for placement. The 8 Fr catheter was tunneled from the port pocket site to the venotomy incision. The port was placed in the pocket. The external catheter was trimmed to appropriate length. At the venotomy, an 8 Fr peel-away sheath was placed over a guidewire under fluoroscopic guidance. The catheter was then placed through the sheath and the sheath was removed. Final catheter positioning was confirmed and documented with a fluoroscopic spot  radiograph. The port was accessed with a Huber needle, aspirated and flushed with heparinized saline. The port pocket incision was closed with interrupted 3-0 Vicryl suture then Dermabond was applied, including at the venotomy incision. Dressings were placed. The patient tolerated the procedure well without immediate post procedural complication. IMPRESSION: Successful placement of a RIGHT internal jugular approach power injectable Port-A-Cath. The tip of the catheter is positioned within the proximal RIGHT atrium. The catheter is ready for immediate use. Roanna Banning, MD [facility] Radiology Electronically Signed   By: Roanna Banning M.D.   On: 07/13/2023 17:21  MR BRAIN W WO CONTRAST Result Date: 07/11/2023 CLINICAL DATA:  Non-small cell lung cancer, staging EXAM: MRI HEAD WITHOUT AND WITH CONTRAST TECHNIQUE: Multiplanar, multiecho pulse sequences of the brain and surrounding structures were obtained without and with intravenous contrast. CONTRAST:  6mL GADAVIST GADOBUTROL 1 MMOL/ML IV SOLN COMPARISON:  10/17/2014 MRI head FINDINGS: Brain: No restricted diffusion to suggest acute or subacute infarct. No abnormal parenchymal or meningeal enhancement. No acute hemorrhage, mass, mass effect, or midline shift. No hydrocephalus or extra-axial collection. Partial empty sella. Craniocervical junction within normal limits. No hemosiderin deposition to suggest remote hemorrhage. Normal cerebral volume for age. Scattered and confluent T2 hyperintense signal in the periventricular white matter, likely the sequela of mild-to-moderate chronic small vessel ischemic disease. Mildly advanced cerebral volume loss for age, without disproportionate lobar atrophy. Vascular: Normal arterial flow voids. Normal arterial and venous enhancement. Skull and upper cervical spine: Normal marrow signal. Sinuses/Orbits: Clear paranasal sinuses. No acute finding in the orbits. Other: The mastoid  air cells are well aerated. IMPRESSION: No acute intracranial process. No evidence of intracranial metastatic disease. Electronically Signed   By: Wiliam Ke M.D.   On: 07/11/2023 21:16   NM PET Image Initial (PI) Skull Base To Thigh Result Date: 07/11/2023 CLINICAL DATA:  Initial treatment strategy for lung mass. EXAM: NUCLEAR MEDICINE PET SKULL BASE TO THIGH TECHNIQUE: 8.45 mCi F-18 FDG was injected intravenously. Full-ring PET imaging was performed from the skull base to thigh after the radiotracer. CT data was obtained and used for attenuation correction and anatomic localization. Fasting blood glucose: 152 mg/dl COMPARISON:  CT chest, abdomen and pelvis from 05/29/2023 FINDINGS: Mediastinal blood pool activity: SUV max 2.24 Liver activity: SUV max NA NECK: No hypermetabolic lymph nodes in the neck. Incidental CT findings: Asymmetric increased tracer uptake identified within the right vocal cord. This may reflect sequelae of the AP window mass noted in the chest with resultant mass effect upon the recurrent laryngeal nerve. Clinical correlation advised. CHEST: Within the paramediastinal left upper lobe there is a solid FDG avid mass which measures 2.7 cm and has an SUV max of 3.45, image 50/6. On the previous CT this measured 2.9 cm. There is adjacent ill-defined soft tissue infiltration within the left pre-vascular, AP window, and left paratracheal region with SUV max of 2.39. Progressive peribronchovascular infiltrative soft tissue and nodularity is identified within the left upper lobe, image 56/6. This is concerning for peribronchovascular spread of tumor with SUV max of 2.42, image 56 of series 6. Soft tissue thickening and nodularity along the oblique fissure of the left lung is also noted concerning for pleural involvement by tumor, image 56/6. This is new from previous exam. Small left pleural effusion is identified which appears partially loculated over the posterior left upper lobe, also new from  prior exam. Cannot exclude malignant pleural effusion. Signs of nodal metastasis within the chest noted including: Nodularity within the prevascular anterior mediastinum, anterior to the SVC measures 1.6 cm with SUV max of 2.0, image 48/6. Previously this measured 0.6 cm. -Enlarged right internal mammary lymph node measures 1.2 cm with SUV max of 1.85, image 52/6. New from previous exam. -Tracer avid left supraclavicular lymph nodes are identified. Index node measures 0.7 cm with SUV max of 2.32. Previously this measured the same. Multiple scattered lung nodules are identified throughout the remaining portions of the lungs concerning for metastatic disease. Index lesions include: -Index nodule within the subpleural right upper lobe measures 8 mm with SUV max of 1.12, image  45/6. Previously this measured 5 mm and appeared less solid. -Index nodule within the lingula measures 1.2 cm with SUV max 1.80. Previously this measured 0.3 cm. Incidental CT findings: Emphysema. Mild aortic atherosclerotic calcifications. ABDOMEN/PELVIS: No abnormal tracer avid lesions above background liver activity identified. No abnormal tracer uptake identified within the pancreas or adrenal glands. No tracer avid abdominopelvic lymph nodes. Incidental CT findings: Aortic atherosclerosis. Innumerable bilateral renal cysts are identified. SKELETON: Widespread tracer avid lytic bone metastases are identified involving the axial and proximal appendicular skeleton. Index lesions include: -Large lytic lesion with areas of cortical fracture involving the left femoral neck with SUV max of 4.17, image 142/6. Although not completely fractured at this time I am concerned for an impending pathologic fracture. -More distally within the proximal left femur is a permeative tracer avid lesion with surrounding soft tissue component with SUV max of 4.23, image 158/6. -Tumor involving the L4-5 level is identified with soft tissue extension into the  surrounding paraspinal soft tissues. -Underlying permeative appearance of the L4 and L5 vertebra noted. Cannot exclude posterior extension of tumor into the lower lumbar canal at this level. -Permeative lesion involving the posterior aspect of the left third rib has an SUV max of 3.64. Incidental CT findings: None. IMPRESSION: 1. There is a 2.7 cm solid FDG avid mass within the paramediastinal left upper lobe compatible with primary bronchogenic carcinoma. There is adjacent ill-defined soft tissue infiltration within the left pre-vascular, AP window, and left paratracheal region compatible with nodal metastasis. 2. Progressive peribronchovascular infiltrative soft tissue and nodularity is identified within the left upper lobe concerning for peribronchovascular spread of tumor. 3. Soft tissue thickening and nodularity along the oblique fissure of the left lung is also noted concerning for pleural involvement by tumor. New from previous exam. 4. New left pleural effusion is identified which appears partially loculated over the posterior left upper lobe. Cannot exclude malignant pleural effusion. 5. Multiple scattered lung nodules are identified throughout the remaining portions of the lungs concerning for metastatic disease. Index nodules are increased in size. 6. Widespread tracer avid lytic bone metastases are identified involving the axial and proximal appendicular skeleton. 7. Large lytic lesion with areas of cortical fracture involving the left femoral neck. Although not completely fractured at this time I am concerned for an impending pathologic fracture. 8. Asymmetric increased tracer uptake identified within the right vocal cord. This may reflect sequelae of the AP window mass noted in the chest with resultant mass effect upon the recurrent laryngeal nerve. 9. Aortic Atherosclerosis (ICD10-I70.0) and Emphysema (ICD10-J43.9). Electronically Signed   By: Signa Kell M.D.   On: 07/11/2023 17:37      ASSESSMENT/PLAN:  This is a very pleasant 73 year old African-American male diagnosed with stage IV (T3, N2, M1 C) non-small cell lung cancer, adenocarcinoma.  He presented with a left upper lobe lung mass with left hilar mediastinal invasion in addition to thoracic inlet lymphadenopathy as well as several metastatic bone lesions involving the spine, ribs, and right iliac bone.  The patient was diagnosed in December 2024.  His staging PET scan is scheduled for later this week on 07/05/2023.  His brain MRI is pending.   His molecular studies by [id number] showed no actionable mutations and his PD-L1 expression is negative.  He completed palliative radiation to L4/L5 to the care of Dr. Kathrynn Running in early February 2025. He also under went surgery on his hip due to the impending fracture on 07/25/23 under the care of Dr. August Saucer.  Because of the patient's CKD, the patient is undergoing palliative systemic chemotherapy and immunotherapy with carboplatin for an AUC of 5, paclitaxel 175 mg/m, and Libtayo with Neulasta support. His first dose was on 07/03/2023. He tolerated it well but has been struggling with anemia.   The patient had a CBC, iron studies, sample of blood bank, and other routine labs performed today.  Hemoglobin is 9.7.  His CMP is pending at this time.  As long as this is within parameters, Dr. Arbutus Ped recommends that he proceed with treatment today as scheduled.  Dr. Arbutus Ped would recommend keeping his dose of his chemotherapy the same today.    However, we will continue to monitor the patient's labs on a weekly basis. I have added standing orders for sample of blood bank should he require a blood transfusion in the future.  Should he have any worsening fatigue, shortness of breath, or weakness he was advised to call the clinic so we can evaluate to determine if he requires a blood transfusion.  We would consider transfusion if the hemoglobin were less than 8.  We will see him back for  follow-up visit in 3 weeks for evaluation and repeat blood work before undergoing cycle #3.  He is currently following with palliative care on 08/21/23.   His blood sugar is elevated today. He took 5 units prior to coming to the clinic but did not bring his insulin with him to the clinic. His BS was rechecked and was elevated at 365. We will administer additional insulin in the clinic today. He was advised to also monitor his BS closely at home and take his insulin as prescribed.   The patient was advised to call immediately if he has any concerning symptoms in the interval. The patient voices understanding of current disease status and treatment options and is in agreement with the current care plan. All questions were answered. The patient knows to call the clinic with any problems, questions or concerns. We can certainly see the patient much sooner if necessary  No orders of the defined types were placed in this encounter.  Ashyla Luth L Hubert Derstine, PA-C 08/04/23  ADDENDUM: Hematology/Oncology Attending: I had a face-to-face encounter with the patient today.  I reviewed his record, lab and recommended his care plan.  This is a very pleasant 72 years old African-American male with stage IV non-small cell lung cancer, adenocarcinoma diagnosed in December 2024 with no actionable mutations and negative PD-L1 expression.  The patient is status post palliative radiotherapy to the L4 and L5 as well as the left hip followed by internal fixation.  He is currently undergoing systemic treatment with carboplatin, paclitaxel and Libtayo (Cempilimab) status post 1 cycle.  He tolerated the first cycle of his treatment fairly well except for pancytopenia which is improving. I recommended for the patient to proceed with cycle #2 today as planned. For the impending fraction of the left hip, the patient underwent internal fixation 2 weeks ago and feeling better. He is currently undergoing physical therapy at  home. We will see him back for follow-up visit in 3 weeks for evaluation before starting cycle #3. The patient was advised to call immediately if he has any other concerning symptoms in the interval. The total time spent in the appointment was 30 minutes. Disclaimer: This note was dictated with voice recognition software. Similar sounding words can inadvertently be transcribed and may be missed upon review. Lajuana Matte, MD

## 2023-08-07 ENCOUNTER — Encounter: Payer: Self-pay | Admitting: Physician Assistant

## 2023-08-07 ENCOUNTER — Inpatient Hospital Stay: Payer: Medicare HMO

## 2023-08-07 ENCOUNTER — Inpatient Hospital Stay: Payer: Medicare HMO | Admitting: Physician Assistant

## 2023-08-07 ENCOUNTER — Other Ambulatory Visit: Payer: Self-pay | Admitting: Physician Assistant

## 2023-08-07 ENCOUNTER — Encounter: Payer: Self-pay | Admitting: Internal Medicine

## 2023-08-07 VITALS — BP 108/73 | HR 102 | Temp 97.4°F | Resp 18 | Ht 73.0 in | Wt 153.2 lb

## 2023-08-07 VITALS — BP 133/84 | HR 86 | Resp 16

## 2023-08-07 DIAGNOSIS — Z5112 Encounter for antineoplastic immunotherapy: Secondary | ICD-10-CM

## 2023-08-07 DIAGNOSIS — C778 Secondary and unspecified malignant neoplasm of lymph nodes of multiple regions: Secondary | ICD-10-CM | POA: Diagnosis not present

## 2023-08-07 DIAGNOSIS — E1169 Type 2 diabetes mellitus with other specified complication: Secondary | ICD-10-CM

## 2023-08-07 DIAGNOSIS — M899 Disorder of bone, unspecified: Secondary | ICD-10-CM | POA: Diagnosis not present

## 2023-08-07 DIAGNOSIS — C3490 Malignant neoplasm of unspecified part of unspecified bronchus or lung: Secondary | ICD-10-CM

## 2023-08-07 DIAGNOSIS — R197 Diarrhea, unspecified: Secondary | ICD-10-CM | POA: Diagnosis not present

## 2023-08-07 DIAGNOSIS — D649 Anemia, unspecified: Secondary | ICD-10-CM

## 2023-08-07 DIAGNOSIS — C7951 Secondary malignant neoplasm of bone: Secondary | ICD-10-CM | POA: Diagnosis not present

## 2023-08-07 DIAGNOSIS — Z794 Long term (current) use of insulin: Secondary | ICD-10-CM | POA: Diagnosis not present

## 2023-08-07 DIAGNOSIS — Z510 Encounter for antineoplastic radiation therapy: Secondary | ICD-10-CM | POA: Diagnosis not present

## 2023-08-07 DIAGNOSIS — C3412 Malignant neoplasm of upper lobe, left bronchus or lung: Secondary | ICD-10-CM | POA: Diagnosis not present

## 2023-08-07 DIAGNOSIS — I129 Hypertensive chronic kidney disease with stage 1 through stage 4 chronic kidney disease, or unspecified chronic kidney disease: Secondary | ICD-10-CM | POA: Diagnosis not present

## 2023-08-07 DIAGNOSIS — Z95828 Presence of other vascular implants and grafts: Secondary | ICD-10-CM

## 2023-08-07 DIAGNOSIS — Z5111 Encounter for antineoplastic chemotherapy: Secondary | ICD-10-CM | POA: Diagnosis not present

## 2023-08-07 LAB — CBC WITH DIFFERENTIAL (CANCER CENTER ONLY)
Abs Immature Granulocytes: 0.02 10*3/uL (ref 0.00–0.07)
Basophils Absolute: 0 10*3/uL (ref 0.0–0.1)
Basophils Relative: 0 %
Eosinophils Absolute: 0.1 10*3/uL (ref 0.0–0.5)
Eosinophils Relative: 1 %
HCT: 30.3 % — ABNORMAL LOW (ref 39.0–52.0)
Hemoglobin: 9.7 g/dL — ABNORMAL LOW (ref 13.0–17.0)
Immature Granulocytes: 0 %
Lymphocytes Relative: 7 %
Lymphs Abs: 0.5 10*3/uL — ABNORMAL LOW (ref 0.7–4.0)
MCH: 31.3 pg (ref 26.0–34.0)
MCHC: 32 g/dL (ref 30.0–36.0)
MCV: 97.7 fL (ref 80.0–100.0)
Monocytes Absolute: 0.5 10*3/uL (ref 0.1–1.0)
Monocytes Relative: 6 %
Neutro Abs: 6.2 10*3/uL (ref 1.7–7.7)
Neutrophils Relative %: 86 %
Platelet Count: 163 10*3/uL (ref 150–400)
RBC: 3.1 MIL/uL — ABNORMAL LOW (ref 4.22–5.81)
RDW: 19.1 % — ABNORMAL HIGH (ref 11.5–15.5)
WBC Count: 7.3 10*3/uL (ref 4.0–10.5)
nRBC: 0 % (ref 0.0–0.2)

## 2023-08-07 LAB — IRON AND IRON BINDING CAPACITY (CC-WL,HP ONLY)
Iron: 37 ug/dL — ABNORMAL LOW (ref 45–182)
Saturation Ratios: 17 % — ABNORMAL LOW (ref 17.9–39.5)
TIBC: 213 ug/dL — ABNORMAL LOW (ref 250–450)
UIBC: 176 ug/dL (ref 117–376)

## 2023-08-07 LAB — CMP (CANCER CENTER ONLY)
ALT: 6 U/L (ref 0–44)
AST: 10 U/L — ABNORMAL LOW (ref 15–41)
Albumin: 3.6 g/dL (ref 3.5–5.0)
Alkaline Phosphatase: 222 U/L — ABNORMAL HIGH (ref 38–126)
Anion gap: 6 (ref 5–15)
BUN: 24 mg/dL — ABNORMAL HIGH (ref 8–23)
CO2: 29 mmol/L (ref 22–32)
Calcium: 9.9 mg/dL (ref 8.9–10.3)
Chloride: 105 mmol/L (ref 98–111)
Creatinine: 1.3 mg/dL — ABNORMAL HIGH (ref 0.61–1.24)
GFR, Estimated: 58 mL/min — ABNORMAL LOW (ref >60)
Glucose, Bld: 339 mg/dL — ABNORMAL HIGH (ref 70–99)
Potassium: 4.4 mmol/L (ref 3.5–5.1)
Sodium: 140 mmol/L (ref 135–145)
Total Bilirubin: 0.6 mg/dL (ref 0.0–1.2)
Total Protein: 6.3 g/dL — ABNORMAL LOW (ref 6.5–8.1)

## 2023-08-07 LAB — SAMPLE TO BLOOD BANK

## 2023-08-07 MED ORDER — SODIUM CHLORIDE 0.9% FLUSH: 10.0000 mL | INTRAVENOUS | Status: DC | PRN

## 2023-08-07 MED ORDER — SODIUM CHLORIDE 0.9% FLUSH
10.0000 mL | Freq: Once | INTRAVENOUS | Status: AC
  Administered 2023-08-07: 10 mL

## 2023-08-07 MED ORDER — SODIUM CHLORIDE 0.9 % IV SOLN: INTRAVENOUS | Status: DC

## 2023-08-07 MED ORDER — FAMOTIDINE IN NACL 20-0.9 MG/50ML-% IV SOLN
20.0000 mg | Freq: Once | INTRAVENOUS | Status: AC
  Administered 2023-08-07: 20 mg via INTRAVENOUS
  Filled 2023-08-07: qty 50

## 2023-08-07 MED ORDER — HEPARIN SOD (PORK) LOCK FLUSH 100 UNIT/ML IV SOLN: 500.0000 [IU] | Freq: Once | INTRAVENOUS | Status: DC | PRN

## 2023-08-07 MED ORDER — DEXAMETHASONE SODIUM PHOSPHATE 10 MG/ML IJ SOLN
10.0000 mg | Freq: Once | INTRAMUSCULAR | Status: AC
  Administered 2023-08-07: 10 mg via INTRAVENOUS
  Filled 2023-08-07: qty 1

## 2023-08-07 MED ORDER — INSULIN ASPART 100 UNIT/ML IJ SOLN
6.0000 [IU] | Freq: Once | INTRAMUSCULAR | Status: AC
  Administered 2023-08-07: 6 [IU] via SUBCUTANEOUS
  Filled 2023-08-07: qty 1

## 2023-08-07 MED ORDER — SODIUM CHLORIDE 0.9 % IV SOLN
350.0000 mg | Freq: Once | INTRAVENOUS | Status: AC
  Administered 2023-08-07: 350 mg via INTRAVENOUS
  Filled 2023-08-07: qty 7

## 2023-08-07 MED ORDER — PALONOSETRON HCL INJECTION 0.25 MG/5ML
0.2500 mg | Freq: Once | INTRAVENOUS | Status: AC
  Administered 2023-08-07: 0.25 mg via INTRAVENOUS
  Filled 2023-08-07: qty 5

## 2023-08-07 MED ORDER — CARBOPLATIN CHEMO INJECTION 450 MG/45ML
312.0000 mg | Freq: Once | INTRAVENOUS | Status: AC
  Administered 2023-08-07: 310 mg via INTRAVENOUS
  Filled 2023-08-07: qty 31

## 2023-08-07 MED ORDER — FOSAPREPITANT DIMEGLUMINE INJECTION 150 MG
150.0000 mg | Freq: Once | INTRAVENOUS | Status: AC
  Administered 2023-08-07: 150 mg via INTRAVENOUS
  Filled 2023-08-07: qty 5
  Filled 2023-08-07: qty 150

## 2023-08-07 MED ORDER — PACLITAXEL CHEMO INJECTION 300 MG/50ML
175.0000 mg/m2 | Freq: Once | INTRAVENOUS | Status: AC
  Administered 2023-08-07: 324 mg via INTRAVENOUS
  Filled 2023-08-07: qty 54

## 2023-08-07 MED ORDER — DIPHENHYDRAMINE HCL 50 MG/ML IJ SOLN
50.0000 mg | Freq: Once | INTRAMUSCULAR | Status: AC
  Administered 2023-08-07: 50 mg via INTRAVENOUS
  Filled 2023-08-07: qty 1

## 2023-08-07 NOTE — Telephone Encounter (Signed)
 scheduled

## 2023-08-07 NOTE — Progress Notes (Signed)
 Continue carboplatin 310mg  per [place], [place]

## 2023-08-07 NOTE — Patient Instructions (Signed)
 [facility] - A DEPT OF [facility]  Discharge Instructions: Thank you for choosing [facility] to provide your oncology and hematology care.   If you have a lab appointment with the Cancer Center, please go directly to the Cancer Center and check in at the registration area.   Wear comfortable clothing and clothing appropriate for easy access to any Portacath or PICC line.   We strive to give you quality time with your provider. You may need to reschedule your appointment if you arrive late (15 or more minutes).  Arriving late affects you and other patients whose appointments are after yours.  Also, if you miss three or more appointments without notifying the office, you may be dismissed from the clinic at the provider's discretion.      For prescription refill requests, have your pharmacy contact our office and allow 72 hours for refills to be completed.    Today you received the following chemotherapy and/or immunotherapy agents Libtayo, Paclitaxel, Carboplatin      To help prevent nausea and vomiting after your treatment, we encourage you to take your nausea medication as directed.  BELOW ARE SYMPTOMS THAT SHOULD BE REPORTED IMMEDIATELY: *FEVER GREATER THAN 100.4 F (38 C) OR HIGHER *CHILLS OR SWEATING *NAUSEA AND VOMITING THAT IS NOT CONTROLLED WITH YOUR NAUSEA MEDICATION *UNUSUAL SHORTNESS OF BREATH *UNUSUAL BRUISING OR BLEEDING *URINARY PROBLEMS (pain or burning when urinating, or frequent urination) *BOWEL PROBLEMS (unusual diarrhea, constipation, pain near the anus) TENDERNESS IN MOUTH AND THROAT WITH OR WITHOUT PRESENCE OF ULCERS (sore throat, sores in mouth, or a toothache) UNUSUAL RASH, SWELLING OR PAIN  UNUSUAL VAGINAL DISCHARGE OR ITCHING   Items with * indicate a potential emergency and should be followed up as soon as possible or go to the Emergency Department if any problems should occur.  Please show the CHEMOTHERAPY ALERT  CARD or IMMUNOTHERAPY ALERT CARD at check-in to the Emergency Department and triage nurse.  Should you have questions after your visit or need to cancel or reschedule your appointment, please contact [facility] - A DEPT OF [facility]  Dept: [phone]  and follow the prompts.  Office hours are 8:00 a.m. to 4:30 p.m. Monday - Friday. Please note that voicemails left after 4:00 p.m. may not be returned until the following business day.  We are closed weekends and major holidays. You have access to a nurse at all times for urgent questions. Please call the main number to the clinic Dept: [phone] and follow the prompts.   For any non-urgent questions, you may also contact your provider using MyChart. We now offer e-Visits for anyone 61 and older to request care online for non-urgent symptoms. For details visit [email or web address].   Also download the MyChart app! Go to the app store, search "MyChart", open the app, select [place], and log in with your MyChart username and password.  Cemiplimab Injection (Libtayo) What is this medication? CEMIPLIMAB (se MIP li mab) treats skin cancer and lung cancer. It works by helping your immune system slow or stop the spread of cancer cells. It is a monoclonal antibody. This medicine may be used for other purposes; ask your health care provider or pharmacist if you have questions. COMMON BRAND NAME(S): LIBTAYO What should I tell my care team before I take this medication? They need to know if you have any of these conditions: Allogeneic stem cell transplant (uses someone  else's stem cells) Autoimmune diseases, such as Crohn disease, ulcerative colitis, lupus History of chest radiation Nervous system problems, such as Guillain-Barre syndrome or myasthenia gravis Organ transplant An unusual or allergic reaction to cemiplimab, other medications, foods, dyes, or preservatives Pregnant or trying to get  pregnant Breastfeeding How should I use this medication? This medication is infused into a vein. It is given by your care team in a hospital or clinic setting. A special MedGuide will be given to you before each treatment. Be sure to read this information carefully each time. Talk to your care team about the use of this medication in children. Special care may be needed. Overdosage: If you think you have taken too much of this medicine contact a poison control center or emergency room at once. NOTE: This medicine is only for you. Do not share this medicine with others. What if I miss a dose? Keep appointments for follow-up doses. It is important not to miss your dose. Call your care team if you are unable to keep an appointment. What may interact with this medication? Interactions have not been studied. This list may not describe all possible interactions. Give your health care provider a list of all the medicines, herbs, non-prescription drugs, or dietary supplements you use. Also tell them if you smoke, drink alcohol, or use illegal drugs. Some items may interact with your medicine. What should I watch for while using this medication? Visit your care team for regular checks on your progress. It may be some time before you see the benefit from this medication. You may need blood work done while taking this medication. This medication may cause serious skin reactions. They can happen weeks to months after starting the medication. Contact your care team right away if you notice fevers or flu-like symptoms with a rash. The rash may be red or purple and then turn into blisters or peeling of the skin. You may also notice a red rash with swelling of the face, lips, or lymph nodes in your neck or under your arms. Tell your care team right away if you have any change in your eyesight. Talk to your care team if you may be pregnant. You will need a negative pregnancy test before starting this medication.  Contraception is recommended while taking this medication and for 4 months after the last dose. Your care team can help you find the option that works for you. Do not breastfeed while taking this medication and for at least 4 months after the last dose. What side effects may I notice from receiving this medication? Side effects that you should report to your care team as soon as possible: Allergic reactions--skin rash, itching, hives, swelling of the face, lips, tongue, or throat Dry cough, shortness of breath or trouble breathing Eye pain, redness, irritation, or discharge with blurry or decreased vision Heart muscle inflammation--unusual weakness or fatigue, shortness of breath, chest pain, fast or irregular heartbeat, dizziness, swelling of the ankles, feet, or hands Hormone gland problems--headache, sensitivity to light, unusual weakness or fatigue, dizziness, fast or irregular heartbeat, increased sensitivity to cold or heat, excessive sweating, constipation, hair loss, increased thirst or amount of urine, tremors or shaking, irritability Infusion reactions--chest pain, shortness of breath or trouble breathing, feeling faint or lightheaded Kidney injury (glomerulonephritis)--decrease in the amount of urine, red or dark brown urine, foamy or bubbly urine, swelling of the ankles, hands, or feet Liver injury--right upper belly pain, loss of appetite, nausea, light-colored stool, dark yellow or brown  urine, yellowing skin or eyes, unusual weakness or fatigue Pain, tingling, or numbness in the hands or feet, muscle weakness, change in vision, confusion or trouble speaking, loss of balance or coordination, trouble walking, seizures Rash, fever, and swollen lymph nodes Redness, blistering, peeling, or loosening of the skin, including inside the mouth Sudden or severe stomach pain, bloody diarrhea, fever, nausea, vomiting Side effects that usually do not require medical attention (report these to your  care team if they continue or are bothersome): Bone, joint, or muscle pain Diarrhea Fatigue Loss of appetite Nausea Skin rash This list may not describe all possible side effects. Call your doctor for medical advice about side effects. You may report side effects to FDA at 1-800-FDA-1088. Where should I keep my medication? This medication is given in a hospital or clinic and will not be stored at home. NOTE: This sheet is a summary. It may not cover all possible information. If you have questions about this medicine, talk to your doctor, pharmacist, or health care provider.  2024 Elsevier/Gold Standard (2022-07-18 00:00:00)  Paclitaxel Injection What is this medication? PACLITAXEL (PAK li TAX el) treats some types of cancer. It works by slowing down the growth of cancer cells. This medicine may be used for other purposes; ask your health care provider or pharmacist if you have questions. COMMON BRAND NAME(S): Onxol, Taxol What should I tell my care team before I take this medication? They need to know if you have any of these conditions: Heart disease Liver disease Low white blood cell levels An unusual or allergic reaction to paclitaxel, other medications, foods, dyes, or preservatives If you or your partner are pregnant or trying to get pregnant Breast-feeding How should I use this medication? This medication is injected into a vein. It is given by your care team in a hospital or clinic setting. Talk to your care team about the use of this medication in children. While it may be given to children for selected conditions, precautions do apply. Overdosage: If you think you have taken too much of this medicine contact a poison control center or emergency room at once. NOTE: This medicine is only for you. Do not share this medicine with others. What if I miss a dose? Keep appointments for follow-up doses. It is important not to miss your dose. Call your care team if you are unable to  keep an appointment. What may interact with this medication? Do not take this medication with any of the following: Live virus vaccines Other medications may affect the way this medication works. Talk with your care team about all of the medications you take. They may suggest changes to your treatment plan to lower the risk of side effects and to make sure your medications work as intended. This list may not describe all possible interactions. Give your health care provider a list of all the medicines, herbs, non-prescription drugs, or dietary supplements you use. Also tell them if you smoke, drink alcohol, or use illegal drugs. Some items may interact with your medicine. What should I watch for while using this medication? Your condition will be monitored carefully while you are receiving this medication. You may need blood work while taking this medication. This medication may make you feel generally unwell. This is not uncommon as chemotherapy can affect healthy cells as well as cancer cells. Report any side effects. Continue your course of treatment even though you feel ill unless your care team tells you to stop. This medication can cause  serious allergic reactions. To reduce the risk, your care team may give you other medications to take before receiving this one. Be sure to follow the directions from your care team. This medication may increase your risk of getting an infection. Call your care team for advice if you get a fever, chills, sore throat, or other symptoms of a cold or flu. Do not treat yourself. Try to avoid being around people who are sick. This medication may increase your risk to bruise or bleed. Call your care team if you notice any unusual bleeding. Be careful brushing or flossing your teeth or using a toothpick because you may get an infection or bleed more easily. If you have any dental work done, tell your dentist you are receiving this medication. Talk to your care team if  you may be pregnant. Serious birth defects can occur if you take this medication during pregnancy. Talk to your care team before breastfeeding. Changes to your treatment plan may be needed. What side effects may I notice from receiving this medication? Side effects that you should report to your care team as soon as possible: Allergic reactions--skin rash, itching, hives, swelling of the face, lips, tongue, or throat Heart rhythm changes--fast or irregular heartbeat, dizziness, feeling faint or lightheaded, chest pain, trouble breathing Increase in blood pressure Infection--fever, chills, cough, sore throat, wounds that don't heal, pain or trouble when passing urine, general feeling of discomfort or being unwell Low blood pressure--dizziness, feeling faint or lightheaded, blurry vision Low red blood cell level--unusual weakness or fatigue, dizziness, headache, trouble breathing Painful swelling, warmth, or redness of the skin, blisters or sores at the infusion site Pain, tingling, or numbness in the hands or feet Slow heartbeat--dizziness, feeling faint or lightheaded, confusion, trouble breathing, unusual weakness or fatigue Unusual bruising or bleeding Side effects that usually do not require medical attention (report to your care team if they continue or are bothersome): Diarrhea Hair loss Joint pain Loss of appetite Muscle pain Nausea Vomiting This list may not describe all possible side effects. Call your doctor for medical advice about side effects. You may report side effects to FDA at 1-800-FDA-1088. Where should I keep my medication? This medication is given in a hospital or clinic. It will not be stored at home. NOTE: This sheet is a summary. It may not cover all possible information. If you have questions about this medicine, talk to your doctor, pharmacist, or health care provider.  2024 Elsevier/Gold Standard (2021-10-19 00:00:00)  Carboplatin Injection What is this  medication? CARBOPLATIN (KAR boe pla tin) treats some types of cancer. It works by slowing down the growth of cancer cells. This medicine may be used for other purposes; ask your health care provider or pharmacist if you have questions. COMMON BRAND NAME(S): Paraplatin What should I tell my care team before I take this medication? They need to know if you have any of these conditions: Blood disorders Hearing problems Kidney disease Recent or ongoing radiation therapy An unusual or allergic reaction to carboplatin, cisplatin, other medications, foods, dyes, or preservatives Pregnant or trying to get pregnant Breast-feeding How should I use this medication? This medication is injected into a vein. It is given by your care team in a hospital or clinic setting. Talk to your care team about the use of this medication in children. Special care may be needed. Overdosage: If you think you have taken too much of this medicine contact a poison control center or emergency room at once. NOTE: This  medicine is only for you. Do not share this medicine with others. What if I miss a dose? Keep appointments for follow-up doses. It is important not to miss your dose. Call your care team if you are unable to keep an appointment. What may interact with this medication? Medications for seizures Some antibiotics, such as amikacin, gentamicin, neomycin, streptomycin, tobramycin Vaccines This list may not describe all possible interactions. Give your health care provider a list of all the medicines, herbs, non-prescription drugs, or dietary supplements you use. Also tell them if you smoke, drink alcohol, or use illegal drugs. Some items may interact with your medicine. What should I watch for while using this medication? Your condition will be monitored carefully while you are receiving this medication. You may need blood work while taking this medication. This medication may make you feel generally unwell. This  is not uncommon, as chemotherapy can affect healthy cells as well as cancer cells. Report any side effects. Continue your course of treatment even though you feel ill unless your care team tells you to stop. In some cases, you may be given additional medications to help with side effects. Follow all directions for their use. This medication may increase your risk of getting an infection. Call your care team for advice if you get a fever, chills, sore throat, or other symptoms of a cold or flu. Do not treat yourself. Try to avoid being around people who are sick. Avoid taking medications that contain aspirin, acetaminophen, ibuprofen, naproxen, or ketoprofen unless instructed by your care team. These medications may hide a fever. Be careful brushing or flossing your teeth or using a toothpick because you may get an infection or bleed more easily. If you have any dental work done, tell your dentist you are receiving this medication. Talk to your care team if you wish to become pregnant or think you might be pregnant. This medication can cause serious birth defects. Talk to your care team about effective forms of contraception. Do not breast-feed while taking this medication. What side effects may I notice from receiving this medication? Side effects that you should report to your care team as soon as possible: Allergic reactions--skin rash, itching, hives, swelling of the face, lips, tongue, or throat Infection--fever, chills, cough, sore throat, wounds that don't heal, pain or trouble when passing urine, general feeling of discomfort or being unwell Low red blood cell level--unusual weakness or fatigue, dizziness, headache, trouble breathing Pain, tingling, or numbness in the hands or feet, muscle weakness, change in vision, confusion or trouble speaking, loss of balance or coordination, trouble walking, seizures Unusual bruising or bleeding Side effects that usually do not require medical attention  (report to your care team if they continue or are bothersome): Hair loss Nausea Unusual weakness or fatigue Vomiting This list may not describe all possible side effects. Call your doctor for medical advice about side effects. You may report side effects to FDA at 1-800-FDA-1088. Where should I keep my medication? This medication is given in a hospital or clinic. It will not be stored at home. NOTE: This sheet is a summary. It may not cover all possible information. If you have questions about this medicine, talk to your doctor, pharmacist, or health care provider.  2024 Elsevier/Gold Standard (2021-09-21 00:00:00)

## 2023-08-07 NOTE — Progress Notes (Signed)
 Patient blood sugar was 339- Jonathan Page requested a recheck and it was 365. Orders to give 6 units of insulin. Education about monitoring his blood sugar more closely at home.

## 2023-08-08 ENCOUNTER — Other Ambulatory Visit: Payer: Self-pay | Admitting: Physician Assistant

## 2023-08-08 DIAGNOSIS — D509 Iron deficiency anemia, unspecified: Secondary | ICD-10-CM | POA: Insufficient documentation

## 2023-08-09 ENCOUNTER — Inpatient Hospital Stay: Payer: Medicare HMO

## 2023-08-09 ENCOUNTER — Ambulatory Visit: Payer: Medicare HMO

## 2023-08-09 VITALS — BP 106/78 | HR 107 | Temp 98.1°F | Resp 18

## 2023-08-09 DIAGNOSIS — I129 Hypertensive chronic kidney disease with stage 1 through stage 4 chronic kidney disease, or unspecified chronic kidney disease: Secondary | ICD-10-CM | POA: Diagnosis not present

## 2023-08-09 DIAGNOSIS — C3490 Malignant neoplasm of unspecified part of unspecified bronchus or lung: Secondary | ICD-10-CM | POA: Diagnosis not present

## 2023-08-09 DIAGNOSIS — M84552D Pathological fracture in neoplastic disease, left femur, subsequent encounter for fracture with routine healing: Secondary | ICD-10-CM | POA: Diagnosis not present

## 2023-08-09 DIAGNOSIS — I10 Essential (primary) hypertension: Secondary | ICD-10-CM | POA: Diagnosis not present

## 2023-08-09 DIAGNOSIS — Z87891 Personal history of nicotine dependence: Secondary | ICD-10-CM | POA: Diagnosis not present

## 2023-08-09 DIAGNOSIS — E119 Type 2 diabetes mellitus without complications: Secondary | ICD-10-CM | POA: Diagnosis not present

## 2023-08-09 DIAGNOSIS — C778 Secondary and unspecified malignant neoplasm of lymph nodes of multiple regions: Secondary | ICD-10-CM | POA: Diagnosis not present

## 2023-08-09 DIAGNOSIS — Z5112 Encounter for antineoplastic immunotherapy: Secondary | ICD-10-CM | POA: Diagnosis not present

## 2023-08-09 DIAGNOSIS — C3412 Malignant neoplasm of upper lobe, left bronchus or lung: Secondary | ICD-10-CM | POA: Diagnosis not present

## 2023-08-09 DIAGNOSIS — Z794 Long term (current) use of insulin: Secondary | ICD-10-CM | POA: Diagnosis not present

## 2023-08-09 DIAGNOSIS — Z5111 Encounter for antineoplastic chemotherapy: Secondary | ICD-10-CM | POA: Diagnosis not present

## 2023-08-09 DIAGNOSIS — Z7982 Long term (current) use of aspirin: Secondary | ICD-10-CM | POA: Diagnosis not present

## 2023-08-09 DIAGNOSIS — R197 Diarrhea, unspecified: Secondary | ICD-10-CM | POA: Diagnosis not present

## 2023-08-09 DIAGNOSIS — C7951 Secondary malignant neoplasm of bone: Secondary | ICD-10-CM | POA: Diagnosis not present

## 2023-08-09 DIAGNOSIS — M899 Disorder of bone, unspecified: Secondary | ICD-10-CM | POA: Diagnosis not present

## 2023-08-09 DIAGNOSIS — Z510 Encounter for antineoplastic radiation therapy: Secondary | ICD-10-CM | POA: Diagnosis not present

## 2023-08-09 DIAGNOSIS — F0781 Postconcussional syndrome: Secondary | ICD-10-CM | POA: Diagnosis not present

## 2023-08-09 MED ORDER — PEGFILGRASTIM-CBQV 6 MG/0.6ML ~~LOC~~ SOSY
6.0000 mg | PREFILLED_SYRINGE | Freq: Once | SUBCUTANEOUS | Status: AC
  Administered 2023-08-09: 6 mg via SUBCUTANEOUS
  Filled 2023-08-09: qty 0.6

## 2023-08-11 ENCOUNTER — Encounter: Payer: Self-pay | Admitting: Surgical

## 2023-08-11 ENCOUNTER — Other Ambulatory Visit (INDEPENDENT_AMBULATORY_CARE_PROVIDER_SITE_OTHER): Payer: Self-pay

## 2023-08-11 ENCOUNTER — Ambulatory Visit: Payer: Medicare HMO | Admitting: Surgical

## 2023-08-11 DIAGNOSIS — F0781 Postconcussional syndrome: Secondary | ICD-10-CM | POA: Diagnosis not present

## 2023-08-11 DIAGNOSIS — S72002D Fracture of unspecified part of neck of left femur, subsequent encounter for closed fracture with routine healing: Secondary | ICD-10-CM | POA: Diagnosis not present

## 2023-08-11 DIAGNOSIS — Z794 Long term (current) use of insulin: Secondary | ICD-10-CM | POA: Diagnosis not present

## 2023-08-11 DIAGNOSIS — M84552D Pathological fracture in neoplastic disease, left femur, subsequent encounter for fracture with routine healing: Secondary | ICD-10-CM | POA: Diagnosis not present

## 2023-08-11 DIAGNOSIS — Z87891 Personal history of nicotine dependence: Secondary | ICD-10-CM | POA: Diagnosis not present

## 2023-08-11 DIAGNOSIS — I10 Essential (primary) hypertension: Secondary | ICD-10-CM | POA: Diagnosis not present

## 2023-08-11 DIAGNOSIS — C7951 Secondary malignant neoplasm of bone: Secondary | ICD-10-CM | POA: Diagnosis not present

## 2023-08-11 DIAGNOSIS — S72002A Fracture of unspecified part of neck of left femur, initial encounter for closed fracture: Secondary | ICD-10-CM

## 2023-08-11 DIAGNOSIS — C3490 Malignant neoplasm of unspecified part of unspecified bronchus or lung: Secondary | ICD-10-CM | POA: Diagnosis not present

## 2023-08-11 DIAGNOSIS — E119 Type 2 diabetes mellitus without complications: Secondary | ICD-10-CM | POA: Diagnosis not present

## 2023-08-11 DIAGNOSIS — Z7982 Long term (current) use of aspirin: Secondary | ICD-10-CM | POA: Diagnosis not present

## 2023-08-11 NOTE — Progress Notes (Signed)
 Post-Op Visit Note   Patient: Jonathan Page           Date of Birth: 1951/03/21           MRN: [id number] Visit Date: 08/11/2023 PCP: Doreene Nest, NP   Assessment & Plan:  Chief Complaint:  Chief Complaint  Patient presents with   Left Hip - Routine Post Op   Visit Diagnoses:  1. Closed fracture of left hip, initial encounter (HCC)   2. Closed fracture of left hip requiring operative repair with routine healing, subsequent encounter     Plan: Diante is a 73 year old male who presents s/p left hip intramedullary nail on 07/25/2023 that was performed for impending femur fracture due to pathologic fracture.  Patient states that he is doing well overall.  Working with home health physical therapy.  Ambulates with a cane.  Takes oxycodone about every 6-7 hours as needed.  Taking aspirin daily.  No new chest pain or shortness of breath any different from his baseline.  Takes occasional Tylenol as well.  No fevers or chills.  Patient did have a fall on the day of his discharge home due to weather conditions but had no significantly increased pain following this fall.  On exam, patient has incisions that are healing well with sutures intact.  Sutures removed and replaced with Steri-Strips today.  No calf tenderness.  Negative Homans' sign.  He has intact quadricep strength and able to perform straight leg raise.  Excellent hip flexion strength rated 5/5.  He has 5 -/5 hip abduction strength relative to 5/5 strength on the right side.  Mild discomfort with passive motion of the left hip.  Overall, patient is progressing appropriately following his intramedullary nail placement about 2 weeks ago.  Plan to continue with home health physical therapy and follow-up for clinical recheck in 4 weeks with Dr. August Saucer.  Call with any concerns in the meantime.  Continue taking aspirin.  Reviewed radiographs with patient and his wife today.  Follow-Up Instructions: No follow-ups on file.   Orders:   Orders Placed This Encounter  Procedures   XR FEMUR MIN 2 VIEWS LEFT   No orders of the defined types were placed in this encounter.   Imaging: No results found.  PMFS History: Patient Active Problem List   Diagnosis Date Noted   Iron deficiency anemia 08/08/2023   Anemia 08/01/2023   Thrombocytopenia (HCC) 08/01/2023   On antineoplastic chemotherapy 08/01/2023   Uncontrolled type 2 diabetes mellitus with hyperglycemia, without long-term current use of insulin (HCC) 08/01/2023   Chronic dyspnea 08/01/2023   Leg edema, right 08/01/2023   S/P total left hip intramedullary nail 07/25/23 08/01/2023   Status post hip surgery 07/25/2023   Port-A-Cath in place 07/17/2023   Encounter for antineoplastic chemotherapy 07/03/2023   Encounter for antineoplastic immunotherapy 07/03/2023   Non-small cell lung cancer metastatic to bone (HCC) 06/22/2023   Lung mass 06/02/2023   Diabetes mellitus treated with oral medication (HCC) 05/29/2023   Restless legs syndrome (RLS) 05/29/2023   Chest pain 01/20/2023   Left leg pain 10/28/2022   Skin lesion 04/16/2021   Preventative health care 11/08/2017   Syncope and collapse 10/14/2016   Prolonged QT interval 10/14/2016   Hyperlipidemia 01/25/2016   CKD (chronic kidney disease) stage 3, GFR 30-59 ml/min (HCC) 01/25/2016   Essential hypertension 10/27/2015   Type II diabetes mellitus with nephropathy (HCC) 10/22/2015   Past Medical History:  Diagnosis Date   Allergy    Cancer (  HCC)    Cellulitis 02/21/2018   Concussion syndrome    Diabetes (HCC)    Frequent headaches    Hypertension     Family History  Problem Relation Age of Onset   Diabetes Father    Diabetes Paternal Grandfather    Diabetes Paternal Grandmother    Colon cancer Neg Hx    Crohn's disease Neg Hx    Rectal cancer Neg Hx    Stomach cancer Neg Hx    Esophageal cancer Neg Hx     Past Surgical History:  Procedure Laterality Date   APPENDECTOMY  1960   BRONCHIAL  BIOPSY  06/12/2023   Procedure: BRONCHIAL BIOPSIES;  Surgeon: Josephine Igo, DO;  Location: [facility];  Service: Cardiopulmonary;;   BRONCHIAL BRUSHINGS  06/12/2023   Procedure: BRONCHIAL BRUSHINGS;  Surgeon: Josephine Igo, DO;  Location: [facility];  Service: Cardiopulmonary;;   COLONOSCOPY     FEMUR IM NAIL Left 07/25/2023   Procedure: LEFT HIP INTRAMEDULLARY HIP SCREW;  Surgeon: Cammy Copa, MD;  Location: [facility] OR;  Service: Orthopedics;  Laterality: Left;   HIP SURGERY Left 07/25/2023   metal pin placed in L hip area   IR IMAGING GUIDED PORT INSERTION  07/13/2023   VIDEO BRONCHOSCOPY Bilateral 06/12/2023   Procedure: VIDEO BRONCHOSCOPY;  Surgeon: Josephine Igo, DO;  Location: [facility];  Service: Cardiopulmonary;  Laterality: Bilateral;   WISDOM TOOTH EXTRACTION     Social History   Occupational History   Not on file  Tobacco Use   Smoking status: Former    Current packs/day: 0.00    Average packs/day: 0.5 packs/day for 30.0 years (15.0 ttl pk-yrs)    Types: Cigarettes, Cigars    Start date: 10/1988    Quit date: 10/2018    Years since quitting: 4.8   Smokeless tobacco: Never  Vaping Use   Vaping status: Never Used  Substance and Sexual Activity   Alcohol use: Yes    Alcohol/week: 6.0 standard drinks of alcohol    Types: 6 Cans of beer per week    Comment: occasional   Drug use: No   Sexual activity: Not on file

## 2023-08-14 ENCOUNTER — Ambulatory Visit: Payer: Medicare HMO

## 2023-08-14 ENCOUNTER — Inpatient Hospital Stay: Payer: Medicare HMO

## 2023-08-14 ENCOUNTER — Other Ambulatory Visit: Payer: Self-pay | Admitting: Physician Assistant

## 2023-08-14 ENCOUNTER — Ambulatory Visit: Payer: Medicare HMO | Admitting: Internal Medicine

## 2023-08-14 ENCOUNTER — Inpatient Hospital Stay: Payer: Medicare HMO | Attending: Physician Assistant

## 2023-08-14 ENCOUNTER — Telehealth: Payer: Self-pay | Admitting: Physician Assistant

## 2023-08-14 ENCOUNTER — Other Ambulatory Visit: Payer: Medicare HMO

## 2023-08-14 VITALS — BP 96/65 | HR 95 | Temp 97.7°F | Resp 18

## 2023-08-14 DIAGNOSIS — C3412 Malignant neoplasm of upper lobe, left bronchus or lung: Secondary | ICD-10-CM | POA: Diagnosis not present

## 2023-08-14 DIAGNOSIS — Z5111 Encounter for antineoplastic chemotherapy: Secondary | ICD-10-CM | POA: Diagnosis not present

## 2023-08-14 DIAGNOSIS — Z5112 Encounter for antineoplastic immunotherapy: Secondary | ICD-10-CM | POA: Insufficient documentation

## 2023-08-14 DIAGNOSIS — N289 Disorder of kidney and ureter, unspecified: Secondary | ICD-10-CM | POA: Insufficient documentation

## 2023-08-14 DIAGNOSIS — R590 Localized enlarged lymph nodes: Secondary | ICD-10-CM | POA: Insufficient documentation

## 2023-08-14 DIAGNOSIS — Z7984 Long term (current) use of oral hypoglycemic drugs: Secondary | ICD-10-CM | POA: Insufficient documentation

## 2023-08-14 DIAGNOSIS — Z9049 Acquired absence of other specified parts of digestive tract: Secondary | ICD-10-CM | POA: Insufficient documentation

## 2023-08-14 DIAGNOSIS — E119 Type 2 diabetes mellitus without complications: Secondary | ICD-10-CM | POA: Insufficient documentation

## 2023-08-14 DIAGNOSIS — Z79899 Other long term (current) drug therapy: Secondary | ICD-10-CM | POA: Diagnosis not present

## 2023-08-14 DIAGNOSIS — C7951 Secondary malignant neoplasm of bone: Secondary | ICD-10-CM | POA: Insufficient documentation

## 2023-08-14 DIAGNOSIS — I10 Essential (primary) hypertension: Secondary | ICD-10-CM | POA: Diagnosis not present

## 2023-08-14 DIAGNOSIS — D509 Iron deficiency anemia, unspecified: Secondary | ICD-10-CM

## 2023-08-14 DIAGNOSIS — R262 Difficulty in walking, not elsewhere classified: Secondary | ICD-10-CM | POA: Diagnosis not present

## 2023-08-14 DIAGNOSIS — D649 Anemia, unspecified: Secondary | ICD-10-CM | POA: Insufficient documentation

## 2023-08-14 DIAGNOSIS — Z95828 Presence of other vascular implants and grafts: Secondary | ICD-10-CM

## 2023-08-14 DIAGNOSIS — Z923 Personal history of irradiation: Secondary | ICD-10-CM | POA: Diagnosis not present

## 2023-08-14 DIAGNOSIS — Z5189 Encounter for other specified aftercare: Secondary | ICD-10-CM | POA: Diagnosis not present

## 2023-08-14 DIAGNOSIS — E1121 Type 2 diabetes mellitus with diabetic nephropathy: Secondary | ICD-10-CM

## 2023-08-14 LAB — CMP (CANCER CENTER ONLY)
ALT: 9 U/L (ref 0–44)
AST: 8 U/L — ABNORMAL LOW (ref 15–41)
Albumin: 3.6 g/dL (ref 3.5–5.0)
Alkaline Phosphatase: 309 U/L — ABNORMAL HIGH (ref 38–126)
Anion gap: 8 (ref 5–15)
BUN: 17 mg/dL (ref 8–23)
CO2: 25 mmol/L (ref 22–32)
Calcium: 9.7 mg/dL (ref 8.9–10.3)
Chloride: 101 mmol/L (ref 98–111)
Creatinine: 1.3 mg/dL — ABNORMAL HIGH (ref 0.61–1.24)
GFR, Estimated: 58 mL/min — ABNORMAL LOW (ref >60)
Glucose, Bld: 491 mg/dL — ABNORMAL HIGH (ref 70–99)
Potassium: 4.2 mmol/L (ref 3.5–5.1)
Sodium: 134 mmol/L — ABNORMAL LOW (ref 135–145)
Total Bilirubin: 0.4 mg/dL (ref 0.0–1.2)
Total Protein: 6.1 g/dL — ABNORMAL LOW (ref 6.5–8.1)

## 2023-08-14 LAB — CBC WITH DIFFERENTIAL (CANCER CENTER ONLY)
Abs Immature Granulocytes: 0.54 10*3/uL — ABNORMAL HIGH (ref 0.00–0.07)
Basophils Absolute: 0.1 10*3/uL (ref 0.0–0.1)
Basophils Relative: 0 %
Eosinophils Absolute: 0.2 10*3/uL (ref 0.0–0.5)
Eosinophils Relative: 1 %
HCT: 26 % — ABNORMAL LOW (ref 39.0–52.0)
Hemoglobin: 8.2 g/dL — ABNORMAL LOW (ref 13.0–17.0)
Immature Granulocytes: 3 %
Lymphocytes Relative: 7 %
Lymphs Abs: 1.4 10*3/uL (ref 0.7–4.0)
MCH: 31.1 pg (ref 26.0–34.0)
MCHC: 31.5 g/dL (ref 30.0–36.0)
MCV: 98.5 fL (ref 80.0–100.0)
Monocytes Absolute: 1.8 10*3/uL — ABNORMAL HIGH (ref 0.1–1.0)
Monocytes Relative: 9 %
Neutro Abs: 16 10*3/uL — ABNORMAL HIGH (ref 1.7–7.7)
Neutrophils Relative %: 80 %
Platelet Count: 164 10*3/uL (ref 150–400)
RBC: 2.64 MIL/uL — ABNORMAL LOW (ref 4.22–5.81)
RDW: 18.6 % — ABNORMAL HIGH (ref 11.5–15.5)
Smear Review: NORMAL
WBC Count: 20 10*3/uL — ABNORMAL HIGH (ref 4.0–10.5)
nRBC: 0.3 % — ABNORMAL HIGH (ref 0.0–0.2)

## 2023-08-14 LAB — SAMPLE TO BLOOD BANK

## 2023-08-14 MED ORDER — SODIUM CHLORIDE 0.9% FLUSH: 10.0000 mL | Freq: Once | INTRAVENOUS | Status: DC | PRN

## 2023-08-14 MED ORDER — HEPARIN SOD (PORK) LOCK FLUSH 100 UNIT/ML IV SOLN
500.0000 [IU] | Freq: Once | INTRAVENOUS | Status: AC | PRN
  Administered 2023-08-14: 500 [IU]

## 2023-08-14 MED ORDER — DIPHENHYDRAMINE HCL 25 MG PO CAPS
50.0000 mg | ORAL_CAPSULE | Freq: Once | ORAL | Status: AC
Start: 2023-08-14 — End: 2023-08-14
  Administered 2023-08-14: 50 mg via ORAL
  Filled 2023-08-14: qty 2

## 2023-08-14 MED ORDER — SODIUM CHLORIDE 0.9 % IV SOLN
300.0000 mg | Freq: Once | INTRAVENOUS | Status: AC
  Administered 2023-08-14: 300 mg via INTRAVENOUS
  Filled 2023-08-14: qty 15

## 2023-08-14 MED ORDER — ACETAMINOPHEN 325 MG PO TABS
650.0000 mg | ORAL_TABLET | Freq: Once | ORAL | Status: AC
  Administered 2023-08-14: 650 mg via ORAL
  Filled 2023-08-14: qty 2

## 2023-08-14 MED ORDER — INSULIN ASPART 100 UNIT/ML IJ SOLN
10.0000 [IU] | Freq: Once | INTRAMUSCULAR | Status: AC
  Administered 2023-08-14: 10 [IU] via SUBCUTANEOUS
  Filled 2023-08-14: qty 1

## 2023-08-14 MED ORDER — SODIUM CHLORIDE 0.9 % IV SOLN: INTRAVENOUS | Status: DC

## 2023-08-14 NOTE — Patient Instructions (Signed)

## 2023-08-14 NOTE — Telephone Encounter (Signed)
 10 units of insulin ordered under sign and hold to be administered in infusion. The patient will be advised to monitor blood sugar more closely at home and take his diabetic medication as prescribed. If his blood sugar continues to be poorly controlled, would recommend follow up with his PCP or endocrinologist if he has one. Should he have worsening hyperglycemia and/or signs and symptoms of hyperglycemia, would advise ER evaluation.

## 2023-08-15 ENCOUNTER — Other Ambulatory Visit: Payer: Self-pay

## 2023-08-15 DIAGNOSIS — C3490 Malignant neoplasm of unspecified part of unspecified bronchus or lung: Secondary | ICD-10-CM | POA: Diagnosis not present

## 2023-08-15 DIAGNOSIS — E119 Type 2 diabetes mellitus without complications: Secondary | ICD-10-CM | POA: Diagnosis not present

## 2023-08-15 DIAGNOSIS — Z87891 Personal history of nicotine dependence: Secondary | ICD-10-CM | POA: Diagnosis not present

## 2023-08-15 DIAGNOSIS — I10 Essential (primary) hypertension: Secondary | ICD-10-CM | POA: Diagnosis not present

## 2023-08-15 DIAGNOSIS — M84552D Pathological fracture in neoplastic disease, left femur, subsequent encounter for fracture with routine healing: Secondary | ICD-10-CM | POA: Diagnosis not present

## 2023-08-15 DIAGNOSIS — Z794 Long term (current) use of insulin: Secondary | ICD-10-CM | POA: Diagnosis not present

## 2023-08-15 DIAGNOSIS — Z7982 Long term (current) use of aspirin: Secondary | ICD-10-CM | POA: Diagnosis not present

## 2023-08-15 DIAGNOSIS — F0781 Postconcussional syndrome: Secondary | ICD-10-CM | POA: Diagnosis not present

## 2023-08-15 DIAGNOSIS — C7951 Secondary malignant neoplasm of bone: Secondary | ICD-10-CM | POA: Diagnosis not present

## 2023-08-15 MED ORDER — OXYCODONE HCL 5 MG PO TABS: 5.0000 mg | ORAL_TABLET | ORAL | 0 refills | Status: DC | PRN

## 2023-08-16 ENCOUNTER — Ambulatory Visit: Payer: Medicare HMO

## 2023-08-18 DIAGNOSIS — I10 Essential (primary) hypertension: Secondary | ICD-10-CM | POA: Diagnosis not present

## 2023-08-18 DIAGNOSIS — Z87891 Personal history of nicotine dependence: Secondary | ICD-10-CM | POA: Diagnosis not present

## 2023-08-18 DIAGNOSIS — C7951 Secondary malignant neoplasm of bone: Secondary | ICD-10-CM | POA: Diagnosis not present

## 2023-08-18 DIAGNOSIS — F0781 Postconcussional syndrome: Secondary | ICD-10-CM | POA: Diagnosis not present

## 2023-08-18 DIAGNOSIS — Z794 Long term (current) use of insulin: Secondary | ICD-10-CM | POA: Diagnosis not present

## 2023-08-18 DIAGNOSIS — E119 Type 2 diabetes mellitus without complications: Secondary | ICD-10-CM | POA: Diagnosis not present

## 2023-08-18 DIAGNOSIS — M84552D Pathological fracture in neoplastic disease, left femur, subsequent encounter for fracture with routine healing: Secondary | ICD-10-CM | POA: Diagnosis not present

## 2023-08-18 DIAGNOSIS — Z7982 Long term (current) use of aspirin: Secondary | ICD-10-CM | POA: Diagnosis not present

## 2023-08-18 DIAGNOSIS — C3490 Malignant neoplasm of unspecified part of unspecified bronchus or lung: Secondary | ICD-10-CM | POA: Diagnosis not present

## 2023-08-21 ENCOUNTER — Inpatient Hospital Stay

## 2023-08-21 ENCOUNTER — Ambulatory Visit: Payer: Medicare HMO

## 2023-08-21 ENCOUNTER — Inpatient Hospital Stay: Payer: Medicare HMO

## 2023-08-21 VITALS — BP 104/65 | HR 94 | Temp 98.5°F | Wt 146.8 lb

## 2023-08-21 DIAGNOSIS — Z5111 Encounter for antineoplastic chemotherapy: Secondary | ICD-10-CM | POA: Diagnosis not present

## 2023-08-21 DIAGNOSIS — C3412 Malignant neoplasm of upper lobe, left bronchus or lung: Secondary | ICD-10-CM | POA: Diagnosis not present

## 2023-08-21 DIAGNOSIS — R262 Difficulty in walking, not elsewhere classified: Secondary | ICD-10-CM | POA: Diagnosis not present

## 2023-08-21 DIAGNOSIS — Z5189 Encounter for other specified aftercare: Secondary | ICD-10-CM | POA: Diagnosis not present

## 2023-08-21 DIAGNOSIS — D649 Anemia, unspecified: Secondary | ICD-10-CM

## 2023-08-21 DIAGNOSIS — I10 Essential (primary) hypertension: Secondary | ICD-10-CM | POA: Diagnosis not present

## 2023-08-21 DIAGNOSIS — Z5112 Encounter for antineoplastic immunotherapy: Secondary | ICD-10-CM | POA: Diagnosis not present

## 2023-08-21 DIAGNOSIS — Z95828 Presence of other vascular implants and grafts: Secondary | ICD-10-CM

## 2023-08-21 DIAGNOSIS — D509 Iron deficiency anemia, unspecified: Secondary | ICD-10-CM

## 2023-08-21 DIAGNOSIS — R590 Localized enlarged lymph nodes: Secondary | ICD-10-CM | POA: Diagnosis not present

## 2023-08-21 DIAGNOSIS — E119 Type 2 diabetes mellitus without complications: Secondary | ICD-10-CM | POA: Diagnosis not present

## 2023-08-21 DIAGNOSIS — C7951 Secondary malignant neoplasm of bone: Secondary | ICD-10-CM | POA: Diagnosis not present

## 2023-08-21 LAB — CBC WITH DIFFERENTIAL (CANCER CENTER ONLY)
Abs Immature Granulocytes: 1.77 10*3/uL — ABNORMAL HIGH (ref 0.00–0.07)
Basophils Absolute: 0.1 10*3/uL (ref 0.0–0.1)
Basophils Relative: 0 %
Eosinophils Absolute: 0.1 10*3/uL (ref 0.0–0.5)
Eosinophils Relative: 0 %
HCT: 29.1 % — ABNORMAL LOW (ref 39.0–52.0)
Hemoglobin: 9.2 g/dL — ABNORMAL LOW (ref 13.0–17.0)
Immature Granulocytes: 6 %
Lymphocytes Relative: 7 %
Lymphs Abs: 2.1 10*3/uL (ref 0.7–4.0)
MCH: 31.8 pg (ref 26.0–34.0)
MCHC: 31.6 g/dL (ref 30.0–36.0)
MCV: 100.7 fL — ABNORMAL HIGH (ref 80.0–100.0)
Monocytes Absolute: 1.4 10*3/uL — ABNORMAL HIGH (ref 0.1–1.0)
Monocytes Relative: 5 %
Neutro Abs: 25.9 10*3/uL — ABNORMAL HIGH (ref 1.7–7.7)
Neutrophils Relative %: 82 %
Platelet Count: 149 10*3/uL — ABNORMAL LOW (ref 150–400)
RBC: 2.89 MIL/uL — ABNORMAL LOW (ref 4.22–5.81)
RDW: 21.1 % — ABNORMAL HIGH (ref 11.5–15.5)
WBC Count: 31.4 10*3/uL — ABNORMAL HIGH (ref 4.0–10.5)
nRBC: 0.7 % — ABNORMAL HIGH (ref 0.0–0.2)

## 2023-08-21 LAB — CMP (CANCER CENTER ONLY)
ALT: 11 U/L (ref 0–44)
AST: 17 U/L (ref 15–41)
Albumin: 3.9 g/dL (ref 3.5–5.0)
Alkaline Phosphatase: 373 U/L — ABNORMAL HIGH (ref 38–126)
Anion gap: 8 (ref 5–15)
BUN: 21 mg/dL (ref 8–23)
CO2: 26 mmol/L (ref 22–32)
Calcium: 9.5 mg/dL (ref 8.9–10.3)
Chloride: 104 mmol/L (ref 98–111)
Creatinine: 1.48 mg/dL — ABNORMAL HIGH (ref 0.61–1.24)
GFR, Estimated: 50 mL/min — ABNORMAL LOW (ref >60)
Glucose, Bld: 324 mg/dL — ABNORMAL HIGH (ref 70–99)
Potassium: 4.1 mmol/L (ref 3.5–5.1)
Sodium: 138 mmol/L (ref 135–145)
Total Bilirubin: 0.4 mg/dL (ref 0.0–1.2)
Total Protein: 6.4 g/dL — ABNORMAL LOW (ref 6.5–8.1)

## 2023-08-21 LAB — SAMPLE TO BLOOD BANK

## 2023-08-21 MED ORDER — SODIUM CHLORIDE 0.9 % IV SOLN
300.0000 mg | Freq: Once | INTRAVENOUS | Status: AC
  Administered 2023-08-21: 300.0065 mg via INTRAVENOUS
  Filled 2023-08-21: qty 300

## 2023-08-21 MED ORDER — HEPARIN SOD (PORK) LOCK FLUSH 100 UNIT/ML IV SOLN
500.0000 [IU] | Freq: Once | INTRAVENOUS | Status: AC | PRN
  Administered 2023-08-21: 500 [IU]

## 2023-08-21 MED ORDER — SODIUM CHLORIDE 0.9% FLUSH
10.0000 mL | Freq: Once | INTRAVENOUS | Status: AC | PRN
  Administered 2023-08-21: 10 mL

## 2023-08-21 MED ORDER — ACETAMINOPHEN 325 MG PO TABS
650.0000 mg | ORAL_TABLET | Freq: Once | ORAL | Status: AC
  Administered 2023-08-21: 650 mg via ORAL
  Filled 2023-08-21: qty 2

## 2023-08-21 MED ORDER — SODIUM CHLORIDE 0.9 % IV SOLN: INTRAVENOUS | Status: DC

## 2023-08-21 MED ORDER — HEPARIN SOD (PORK) LOCK FLUSH 100 UNIT/ML IV SOLN: 250.0000 [IU] | Freq: Once | INTRAVENOUS | Status: DC | PRN

## 2023-08-21 MED ORDER — DIPHENHYDRAMINE HCL 25 MG PO CAPS
50.0000 mg | ORAL_CAPSULE | Freq: Once | ORAL | Status: AC
  Administered 2023-08-21: 50 mg via ORAL
  Filled 2023-08-21: qty 2

## 2023-08-21 NOTE — Progress Notes (Signed)
 Patient tolerated Venofer well, waited post infusion monitor, vitals WNL

## 2023-08-21 NOTE — Discharge Summary (Incomplete)
 Physician Discharge Summary      Patient ID: Jonathan Page MRN: [id number] DOB/AGE: Jun 08, 1951 73 y.o.  Admit date: 07/25/2023 Discharge date: ***  Admission Diagnoses:  Principal Problem:   Status post hip surgery   Discharge Diagnoses:  Same  Surgeries: Procedure(s): LEFT HIP INTRAMEDULLARY HIP SCREW on 07/25/2023   Consultants:   Discharged Condition: Stable  Hospital Course: Jonathan Page is an 73 y.o. male who was admitted 07/25/2023 with a chief complaint of ***, and found to have a diagnosis of ***.  They were brought to the operating room on 07/25/2023 and underwent the above named procedures.  Pt awoke from anesthesia without complication and was transferred to the floor. On POD1, ***.  Pt will f/u with Dr. August Saucer in clinic in ~2 weeks.   Antibiotics given:  Anti-infectives (From admission, onward)    Start     Dose/Rate Route Frequency Ordered Stop   07/25/23 2200  ceFAZolin (ANCEF) IVPB 2g/100 mL premix        2 g 200 mL/hr over 30 Minutes Intravenous Every 8 hours 07/25/23 1955 07/26/23 0704   07/25/23 1400  ceFAZolin (ANCEF) IVPB 2g/100 mL premix        2 g 200 mL/hr over 30 Minutes Intravenous On call to O.R. 07/25/23 1354 07/25/23 1713     .  Recent vital signs:  Vitals:   07/26/23 0741 07/26/23 1341  BP: 98/73 90/61  Pulse: 89 89  Resp: 16 18  Temp: 98.6 F (37 C) 97.8 F (36.6 C)  SpO2: 94% 90%    Recent laboratory studies:  Results for orders placed or performed during the hospital encounter of 07/25/23  Glucose, capillary   Collection Time: 07/25/23  2:08 PM  Result Value Ref Range   Glucose-Capillary 151 (H) 70 - 99 mg/dL   Comment 1 Notify RN    Comment 2 Document in Chart   Glucose, capillary   Collection Time: 07/25/23  4:15 PM  Result Value Ref Range   Glucose-Capillary 145 (H) 70 - 99 mg/dL   Comment 1 Notify RN    Comment 2 Document in Chart   Glucose, capillary   Collection Time: 07/25/23  6:26 PM  Result Value Ref Range    Glucose-Capillary 180 (H) 70 - 99 mg/dL  Glucose, capillary   Collection Time: 07/25/23  9:54 PM  Result Value Ref Range   Glucose-Capillary 275 (H) 70 - 99 mg/dL  Glucose, capillary   Collection Time: 07/26/23  6:17 AM  Result Value Ref Range   Glucose-Capillary 233 (H) 70 - 99 mg/dL  CBC with Differential/Platelet   Collection Time: 07/26/23  8:43 AM  Result Value Ref Range   WBC 12.3 (H) 4.0 - 10.5 K/uL   RBC 2.48 (L) 4.22 - 5.81 MIL/uL   Hemoglobin 7.6 (L) 13.0 - 17.0 g/dL   HCT 65.7 (L) 84.6 - 96.2 %   MCV 96.4 80.0 - 100.0 fL   MCH 30.6 26.0 - 34.0 pg   MCHC 31.8 30.0 - 36.0 g/dL   RDW 95.2 (H) 84.1 - 32.4 %   Platelets 140 (L) 150 - 400 K/uL   nRBC 0.0 0.0 - 0.2 %   Neutrophils Relative % 85 %   Neutro Abs 10.4 (H) 1.7 - 7.7 K/uL   Lymphocytes Relative 7 %   Lymphs Abs 0.9 0.7 - 4.0 K/uL   Monocytes Relative 6 %   Monocytes Absolute 0.8 0.1 - 1.0 K/uL   Eosinophils Relative 1 %  Eosinophils Absolute 0.1 0.0 - 0.5 K/uL   Basophils Relative 0 %   Basophils Absolute 0.0 0.0 - 0.1 K/uL   Immature Granulocytes 1 %   Abs Immature Granulocytes 0.08 (H) 0.00 - 0.07 K/uL  Comprehensive metabolic panel   Collection Time: 07/26/23  8:43 AM  Result Value Ref Range   Sodium 136 135 - 145 mmol/L   Potassium 4.8 3.5 - 5.1 mmol/L   Chloride 101 98 - 111 mmol/L   CO2 22 22 - 32 mmol/L   Glucose, Bld 214 (H) 70 - 99 mg/dL   BUN 24 (H) 8 - 23 mg/dL   Creatinine, Ser 1.02 (H) 0.61 - 1.24 mg/dL   Calcium 9.4 8.9 - 72.5 mg/dL   Total Protein 6.0 (L) 6.5 - 8.1 g/dL   Albumin 3.0 (L) 3.5 - 5.0 g/dL   AST 18 15 - 41 U/L   ALT 17 0 - 44 U/L   Alkaline Phosphatase 151 (H) 38 - 126 U/L   Total Bilirubin 0.4 0.0 - 1.2 mg/dL   GFR, Estimated 46 (L) >60 mL/min   Anion gap 13 5 - 15  Glucose, capillary   Collection Time: 07/26/23 11:05 AM  Result Value Ref Range   Glucose-Capillary 146 (H) 70 - 99 mg/dL  Glucose, capillary   Collection Time: 07/26/23  4:23 PM  Result Value Ref  Range   Glucose-Capillary 78 70 - 99 mg/dL    Discharge Medications:   Allergies as of 07/26/2023   No Known Allergies      Medication List     STOP taking these medications    oxyCODONE-acetaminophen 5-325 MG tablet Commonly known as: PERCOCET/ROXICET       TAKE these medications    Accu-Chek Aviva Plus w/Device Kit Check blood sugar before breakfast, lunch and bedtime and as directed. Dx E11.65   accu-chek soft touch lancets Check blood sugar before breakfast, lunch and bedtime and as directed. Dx E11.65   aspirin EC 81 MG tablet Take 81 mg by mouth daily.   docusate sodium 100 MG capsule Commonly known as: COLACE Take 1 capsule (100 mg total) by mouth 2 (two) times daily.   empagliflozin 10 MG Tabs tablet Commonly known as: JARDIANCE Take 1 tablet (10 mg total) by mouth daily before breakfast.   folic acid 1 MG tablet Commonly known as: FOLVITE Take 1 mg by mouth in the morning.   gabapentin 300 MG capsule Commonly known as: NEURONTIN Take 1 capsule (300 mg total) by mouth at bedtime. What changed: when to take this   glipiZIDE 10 MG 24 hr tablet Commonly known as: GLUCOTROL XL Take 1 tablet (10 mg total) by mouth daily with breakfast. for diabetes.   glucose blood test strip Commonly known as: Accu-Chek Aviva Plus Check blood sugar before breakfast, lunch and bedtime and as directed. Dx E11.65   insulin NPH-regular Human (70-30) 100 UNIT/ML injection Inject 5 Units into the skin daily with breakfast.   lidocaine-prilocaine cream Commonly known as: EMLA Apply 1 Application topically as needed.   lisinopril 10 MG tablet Commonly known as: ZESTRIL Take 1 tablet (10 mg total) by mouth daily. for blood pressure.   methocarbamol 500 MG tablet Commonly known as: ROBAXIN Take 1 tablet (500 mg total) by mouth every 8 (eight) hours as needed for muscle spasms.   ondansetron 8 MG tablet Commonly known as: ZOFRAN Take 8 mg by mouth every 8 (eight)  hours as needed for nausea or vomiting.   prochlorperazine 10  MG tablet Commonly known as: COMPAZINE Take 1 tablet (10 mg total) by mouth every 6 (six) hours as needed for nausea or vomiting.        Diagnostic Studies: XR FEMUR MIN 2 VIEWS LEFT Result Date: 08/11/2023 AP and lateral views of left femur reviewed.  No fracture or hardware complication noted.  Left intramedullary nail remains in good position and alignment without any complicating features.  No significant change compared with prior radiographs from time of surgery.  DG FEMUR PORT MIN 2 VIEWS LEFT Result Date: 07/25/2023 CLINICAL DATA:  Pathologic left hip fracture status post ORIF, metastatic lung cancer EXAM: LEFT FEMUR PORTABLE 2 VIEWS COMPARISON:  07/13/2023, 07/19/2023 FINDINGS: Frontal and lateral views of the left femur demonstrate interval placement of intramedullary rod and 2 proximal dynamic screws traversing the pathologic left femoral neck fracture seen previously. Alignment is near anatomic. Postsurgical changes in the overlying soft tissues. IMPRESSION: 1. ORIF of a pathologic left femoral neck fracture as above. Anatomic alignment. Electronically Signed   By: Sharlet Salina M.D.   On: 07/25/2023 22:37   DG FEMUR MIN 2 VIEWS LEFT Result Date: 07/25/2023 CLINICAL DATA:  Elective surgery. EXAM: LEFT FEMUR 2 VIEWS COMPARISON:  Preoperative imaging. FINDINGS: Eight fluoroscopic spot views of the left femur submitted from the operating room. Femoral intramedullary nail with trans trochanteric screw fixation. Fluoroscopy time 1 minutes 1 seconds. Dose 9.4 mGy. IMPRESSION: Procedural fluoroscopy during left femur IM nail. Electronically Signed   By: Narda Rutherford M.D.   On: 07/25/2023 18:20   DG C-Arm 1-60 Min-No Report Result Date: 07/25/2023 Fluoroscopy was utilized by the requesting physician.  No radiographic interpretation.   DG C-Arm 1-60 Min-No Report Result Date: 07/25/2023 Fluoroscopy was utilized by the  requesting physician.  No radiographic interpretation.    Disposition: Discharge disposition: 01-Home or Self Care       Discharge Instructions     Call MD / Call 911   Complete by: As directed    If you experience chest pain or shortness of breath, CALL 911 and be transported to the hospital emergency room.  If you develope a fever above 101 F, pus (white drainage) or increased drainage or redness at the wound, or calf pain, call your surgeon's office.   Constipation Prevention   Complete by: As directed    Drink plenty of fluids.  Prune juice may be helpful.  You may use a stool softener, such as Colace (over the counter) 100 mg twice a day.  Use MiraLax (over the counter) for constipation as needed.   Diet - low sodium heart healthy   Complete by: As directed    Discharge instructions   Complete by: As directed    Okay to weight-bear as you can tolerate on the operative leg with crutches or a walker.  Leave the water-proof dressing on.  You may shower with that waterproof dressing on but do not soak in a tub/pool.    Continue physical therapy exercises as you did in the hospital. It is normal to struggle to lift your leg for the first several days after surgery.  Call the office at [phone] with any questions or concerns; you can send [place] a MyChart message as well if you prefer. Follow-up with Dr August Saucer or Drema Pry at your first postop appointment in 7-10 days.   Increase activity slowly as tolerated   Complete by: As directed    Post-operative opioid taper instructions:   Complete by: As directed  POST-OPERATIVE OPIOID TAPER INSTRUCTIONS: It is important to wean off of your opioid medication as soon as possible. If you do not need pain medication after your surgery it is ok to stop day one. Opioids include: Codeine, Hydrocodone(Norco, Vicodin), Oxycodone(Percocet, oxycontin) and hydromorphone amongst others.  Long term and even short term use of opiods can cause: Increased  pain response Dependence Constipation Depression Respiratory depression And more.  Withdrawal symptoms can include Flu like symptoms Nausea, vomiting And more Techniques to manage these symptoms Hydrate well Eat regular healthy meals Stay active Use relaxation techniques(deep breathing, meditating, yoga) Do Not substitute Alcohol to help with tapering If you have been on opioids for less than two weeks and do not have pain than it is ok to stop all together.  Plan to wean off of opioids This plan should start within one week post op of your joint replacement. Maintain the same interval or time between taking each dose and first decrease the dose.  Cut the total daily intake of opioids by one tablet each day Next start to increase the time between doses. The last dose that should be eliminated is the evening dose.           Follow-up Information     Doreene Nest, NP Follow up.   Specialty: Internal Medicine Contact information: [address] [place] [address] [phone]                  Signed: Karenann Cai 08/21/2023, 10:47 AM

## 2023-08-21 NOTE — Telephone Encounter (Signed)
 [email or web address]

## 2023-08-21 NOTE — Telephone Encounter (Signed)
 Please call patient to set up office visit with Jae Dire

## 2023-08-21 NOTE — Patient Instructions (Signed)

## 2023-08-22 ENCOUNTER — Ambulatory Visit
Admission: RE | Admit: 2023-08-22 | Discharge: 2023-08-22 | Disposition: A | Discharge status: Home / Self Care | Payer: Medicare HMO | Source: Ambulatory Visit | Attending: Radiation Oncology | Admitting: Radiation Oncology

## 2023-08-22 NOTE — Progress Notes (Signed)
  Radiation Oncology         [phone] ________________________________  Name: Jonathan Page MRN: [id number]  Date of Service: 08/22/2023  DOB: [birth year]  Post Treatment Telephone Note  Diagnosis:  C79.51 Secondary malignant neoplasm of bone (as documented in provider EOT note)   The patient was not available for call today. Voicemail left.  The patient is scheduled for ongoing care with Dr. Si Gaul  in medical oncology. The patient was encouraged to call if he develops concerns or questions regarding radiation.    [name], LPN

## 2023-08-23 ENCOUNTER — Telehealth: Payer: Self-pay

## 2023-08-23 DIAGNOSIS — M84552D Pathological fracture in neoplastic disease, left femur, subsequent encounter for fracture with routine healing: Secondary | ICD-10-CM | POA: Diagnosis not present

## 2023-08-23 DIAGNOSIS — Z87891 Personal history of nicotine dependence: Secondary | ICD-10-CM | POA: Diagnosis not present

## 2023-08-23 DIAGNOSIS — C7951 Secondary malignant neoplasm of bone: Secondary | ICD-10-CM | POA: Diagnosis not present

## 2023-08-23 DIAGNOSIS — F0781 Postconcussional syndrome: Secondary | ICD-10-CM | POA: Diagnosis not present

## 2023-08-23 DIAGNOSIS — E119 Type 2 diabetes mellitus without complications: Secondary | ICD-10-CM | POA: Diagnosis not present

## 2023-08-23 DIAGNOSIS — C3490 Malignant neoplasm of unspecified part of unspecified bronchus or lung: Secondary | ICD-10-CM | POA: Diagnosis not present

## 2023-08-23 DIAGNOSIS — Z794 Long term (current) use of insulin: Secondary | ICD-10-CM | POA: Diagnosis not present

## 2023-08-23 DIAGNOSIS — Z7982 Long term (current) use of aspirin: Secondary | ICD-10-CM | POA: Diagnosis not present

## 2023-08-23 DIAGNOSIS — I10 Essential (primary) hypertension: Secondary | ICD-10-CM | POA: Diagnosis not present

## 2023-08-23 MED ORDER — BLOOD GLUCOSE MONITORING SUPPL DEVI: 1.0000 | 0 refills | Status: DC

## 2023-08-23 NOTE — Progress Notes (Signed)
 Pt had called me yesterday afternoon. I was able to get back to him this morning. Pt states he was returning my call, but it turns out he listened to an old message. Pt states he is doing well, tolerating treatment, no complaints. Pt states he needs to get better at controlling his diabetes. Pt states he is taking all his diabetes medications as prescribed. I asked him it he was checking his blood sugars and he asked about "the thing for checking it" I asked if he meant the monitor that goes on his arm and he said yes. I asked him to reach out to his primary care to see if continuous glucose monitoring devices may be covered by his insurance so it would be easier to manage his diabetes. Before hanging up, I encouraged to eat well and take his medication as prescribed.

## 2023-08-23 NOTE — Addendum Note (Signed)
 Addended by: Eden Emms on: 08/23/2023 01:25 PM   Modules accepted: Orders

## 2023-08-23 NOTE — Discharge Summary (Signed)
 Physician Discharge Summary      Patient ID: Jonathan Page MRN: [id number] DOB/AGE: [birth year] 73 y.o.  Admit date: 07/25/2023 Discharge date: 07/26/2023  Admission Diagnoses:  Principal Problem:   Status post hip surgery   Discharge Diagnoses:  Same  Surgeries: Procedure(s): LEFT HIP INTRAMEDULLARY HIP SCREW on 07/25/2023   Consultants:   Discharged Condition: Stable  Hospital Course: Jonathan Page is an 73 y.o. male who was admitted 07/25/2023 with a chief complaint of left hip pain, and found to have a diagnosis of left hip impending pathologic fracture.  They were brought to the operating room on 07/25/2023 and underwent the above named procedures.  Pt awoke from anesthesia without complication and was transferred to the floor. On POD1, patient's pain was overall controlled.  He was able to mobilize fairly well with physical therapy.  No red flag signs or symptoms throughout his stay.  Discharged home on POD 1..  Pt will f/u with Dr. August Page in clinic in ~2 weeks.   Antibiotics given:  Anti-infectives (From admission, onward)    Start     Dose/Rate Route Frequency Ordered Stop   07/25/23 2200  ceFAZolin (ANCEF) IVPB 2g/100 mL premix        2 g 200 mL/hr over 30 Minutes Intravenous Every 8 hours 07/25/23 1955 07/26/23 0704   07/25/23 1400  ceFAZolin (ANCEF) IVPB 2g/100 mL premix        2 g 200 mL/hr over 30 Minutes Intravenous On call to O.R. 07/25/23 1354 07/25/23 1713     .  Recent vital signs:  Vitals:   07/26/23 0741 07/26/23 1341  BP: 98/73 90/61  Pulse: 89 89  Resp: 16 18  Temp: 98.6 F (37 C) 97.8 F (36.6 C)  SpO2: 94% 90%    Recent laboratory studies:  Results for orders placed or performed during the hospital encounter of 07/25/23  Glucose, capillary   Collection Time: 07/25/23  2:08 PM  Result Value Ref Range   Glucose-Capillary 151 (H) 70 - 99 mg/dL   Comment 1 Notify RN    Comment 2 Document in Chart   Glucose, capillary   Collection Time:  07/25/23  4:15 PM  Result Value Ref Range   Glucose-Capillary 145 (H) 70 - 99 mg/dL   Comment 1 Notify RN    Comment 2 Document in Chart   Glucose, capillary   Collection Time: 07/25/23  6:26 PM  Result Value Ref Range   Glucose-Capillary 180 (H) 70 - 99 mg/dL  Glucose, capillary   Collection Time: 07/25/23  9:54 PM  Result Value Ref Range   Glucose-Capillary 275 (H) 70 - 99 mg/dL  Glucose, capillary   Collection Time: 07/26/23  6:17 AM  Result Value Ref Range   Glucose-Capillary 233 (H) 70 - 99 mg/dL  CBC with Differential/Platelet   Collection Time: 07/26/23  8:43 AM  Result Value Ref Range   WBC 12.3 (H) 4.0 - 10.5 K/uL   RBC 2.48 (L) 4.22 - 5.81 MIL/uL   Hemoglobin 7.6 (L) 13.0 - 17.0 g/dL   HCT 47.8 (L) 29.5 - 62.1 %   MCV 96.4 80.0 - 100.0 fL   MCH 30.6 26.0 - 34.0 pg   MCHC 31.8 30.0 - 36.0 g/dL   RDW 30.8 (H) 65.7 - 84.6 %   Platelets 140 (L) 150 - 400 K/uL   nRBC 0.0 0.0 - 0.2 %   Neutrophils Relative % 85 %   Neutro Abs 10.4 (H) 1.7 - 7.7 K/uL  Lymphocytes Relative 7 %   Lymphs Abs 0.9 0.7 - 4.0 K/uL   Monocytes Relative 6 %   Monocytes Absolute 0.8 0.1 - 1.0 K/uL   Eosinophils Relative 1 %   Eosinophils Absolute 0.1 0.0 - 0.5 K/uL   Basophils Relative 0 %   Basophils Absolute 0.0 0.0 - 0.1 K/uL   Immature Granulocytes 1 %   Abs Immature Granulocytes 0.08 (H) 0.00 - 0.07 K/uL  Comprehensive metabolic panel   Collection Time: 07/26/23  8:43 AM  Result Value Ref Range   Sodium 136 135 - 145 mmol/L   Potassium 4.8 3.5 - 5.1 mmol/L   Chloride 101 98 - 111 mmol/L   CO2 22 22 - 32 mmol/L   Glucose, Bld 214 (H) 70 - 99 mg/dL   BUN 24 (H) 8 - 23 mg/dL   Creatinine, Ser 1.61 (H) 0.61 - 1.24 mg/dL   Calcium 9.4 8.9 - 09.6 mg/dL   Total Protein 6.0 (L) 6.5 - 8.1 g/dL   Albumin 3.0 (L) 3.5 - 5.0 g/dL   AST 18 15 - 41 U/L   ALT 17 0 - 44 U/L   Alkaline Phosphatase 151 (H) 38 - 126 U/L   Total Bilirubin 0.4 0.0 - 1.2 mg/dL   GFR, Estimated 46 (L) >60 mL/min    Anion gap 13 5 - 15  Glucose, capillary   Collection Time: 07/26/23 11:05 AM  Result Value Ref Range   Glucose-Capillary 146 (H) 70 - 99 mg/dL  Glucose, capillary   Collection Time: 07/26/23  4:23 PM  Result Value Ref Range   Glucose-Capillary 78 70 - 99 mg/dL    Discharge Medications:   Allergies as of 07/26/2023   No Known Allergies      Medication List     STOP taking these medications    oxyCODONE-acetaminophen 5-325 MG tablet Commonly known as: PERCOCET/ROXICET       TAKE these medications    Accu-Chek Aviva Plus w/Device Kit Check blood sugar before breakfast, lunch and bedtime and as directed. Dx E11.65   accu-chek soft touch lancets Check blood sugar before breakfast, lunch and bedtime and as directed. Dx E11.65   aspirin EC 81 MG tablet Take 81 mg by mouth daily.   docusate sodium 100 MG capsule Commonly known as: COLACE Take 1 capsule (100 mg total) by mouth 2 (two) times daily.   empagliflozin 10 MG Tabs tablet Commonly known as: JARDIANCE Take 1 tablet (10 mg total) by mouth daily before breakfast.   folic acid 1 MG tablet Commonly known as: FOLVITE Take 1 mg by mouth in the morning.   gabapentin 300 MG capsule Commonly known as: NEURONTIN Take 1 capsule (300 mg total) by mouth at bedtime. What changed: when to take this   glipiZIDE 10 MG 24 hr tablet Commonly known as: GLUCOTROL XL Take 1 tablet (10 mg total) by mouth daily with breakfast. for diabetes.   glucose blood test strip Commonly known as: Accu-Chek Aviva Plus Check blood sugar before breakfast, lunch and bedtime and as directed. Dx E11.65   insulin NPH-regular Human (70-30) 100 UNIT/ML injection Inject 5 Units into the skin daily with breakfast.   lidocaine-prilocaine cream Commonly known as: EMLA Apply 1 Application topically as needed.   lisinopril 10 MG tablet Commonly known as: ZESTRIL Take 1 tablet (10 mg total) by mouth daily. for blood pressure.   methocarbamol  500 MG tablet Commonly known as: ROBAXIN Take 1 tablet (500 mg total) by mouth every 8 (  eight) hours as needed for muscle spasms.   ondansetron 8 MG tablet Commonly known as: ZOFRAN Take 8 mg by mouth every 8 (eight) hours as needed for nausea or vomiting.   prochlorperazine 10 MG tablet Commonly known as: COMPAZINE Take 1 tablet (10 mg total) by mouth every 6 (six) hours as needed for nausea or vomiting.        Diagnostic Studies: XR FEMUR MIN 2 VIEWS LEFT Result Date: 08/11/2023 AP and lateral views of left femur reviewed.  No fracture or hardware complication noted.  Left intramedullary nail remains in good position and alignment without any complicating features.  No significant change compared with prior radiographs from time of surgery.  DG FEMUR PORT MIN 2 VIEWS LEFT Result Date: 07/25/2023 CLINICAL DATA:  Pathologic left hip fracture status post ORIF, metastatic lung cancer EXAM: LEFT FEMUR PORTABLE 2 VIEWS COMPARISON:  07/13/2023, 07/19/2023 FINDINGS: Frontal and lateral views of the left femur demonstrate interval placement of intramedullary rod and 2 proximal dynamic screws traversing the pathologic left femoral neck fracture seen previously. Alignment is near anatomic. Postsurgical changes in the overlying soft tissues. IMPRESSION: 1. ORIF of a pathologic left femoral neck fracture as above. Anatomic alignment. Electronically Signed   By: Sharlet Salina M.D.   On: 07/25/2023 22:37   DG FEMUR MIN 2 VIEWS LEFT Result Date: 07/25/2023 CLINICAL DATA:  Elective surgery. EXAM: LEFT FEMUR 2 VIEWS COMPARISON:  Preoperative imaging. FINDINGS: Eight fluoroscopic spot views of the left femur submitted from the operating room. Femoral intramedullary nail with trans trochanteric screw fixation. Fluoroscopy time 1 minutes 1 seconds. Dose 9.4 mGy. IMPRESSION: Procedural fluoroscopy during left femur IM nail. Electronically Signed   By: Narda Rutherford M.D.   On: 07/25/2023 18:20   DG C-Arm  1-60 Min-No Report Result Date: 07/25/2023 Fluoroscopy was utilized by the requesting physician.  No radiographic interpretation.   DG C-Arm 1-60 Min-No Report Result Date: 07/25/2023 Fluoroscopy was utilized by the requesting physician.  No radiographic interpretation.    Disposition: Discharge disposition: 01-Home or Self Care       Discharge Instructions     Call MD / Call 911   Complete by: As directed    If you experience chest pain or shortness of breath, CALL 911 and be transported to the hospital emergency room.  If you develope a fever above 101 F, pus (white drainage) or increased drainage or redness at the wound, or calf pain, call your surgeon's office.   Constipation Prevention   Complete by: As directed    Drink plenty of fluids.  Prune juice may be helpful.  You may use a stool softener, such as Colace (over the counter) 100 mg twice a day.  Use MiraLax (over the counter) for constipation as needed.   Diet - low sodium heart healthy   Complete by: As directed    Discharge instructions   Complete by: As directed    Okay to weight-bear as you can tolerate on the operative leg with crutches or a walker.  Leave the water-proof dressing on.  You may shower with that waterproof dressing on but do not soak in a tub/pool.    Continue physical therapy exercises as you did in the hospital. It is normal to struggle to lift your leg for the first several days after surgery.  Call the office at [phone] with any questions or concerns; you can send [place] a MyChart message as well if you prefer. Follow-up with Dr Jonathan Page or Drema Pry  at your first postop appointment in 7-10 days.   Increase activity slowly as tolerated   Complete by: As directed    Post-operative opioid taper instructions:   Complete by: As directed    POST-OPERATIVE OPIOID TAPER INSTRUCTIONS: It is important to wean off of your opioid medication as soon as possible. If you do not need pain medication after your  surgery it is ok to stop day one. Opioids include: Codeine, Hydrocodone(Norco, Vicodin), Oxycodone(Percocet, oxycontin) and hydromorphone amongst others.  Long term and even short term use of opiods can cause: Increased pain response Dependence Constipation Depression Respiratory depression And more.  Withdrawal symptoms can include Flu like symptoms Nausea, vomiting And more Techniques to manage these symptoms Hydrate well Eat regular healthy meals Stay active Use relaxation techniques(deep breathing, meditating, yoga) Do Not substitute Alcohol to help with tapering If you have been on opioids for less than two weeks and do not have pain than it is ok to stop all together.  Plan to wean off of opioids This plan should start within one week post op of your joint replacement. Maintain the same interval or time between taking each dose and first decrease the dose.  Cut the total daily intake of opioids by one tablet each day Next start to increase the time between doses. The last dose that should be eliminated is the evening dose.           Follow-up Information     Doreene Nest, NP Follow up.   Specialty: Internal Medicine Contact information: [address] [place] [address] [phone]                  Signed: Karenann Cai 08/23/2023, 11:24 AM

## 2023-08-23 NOTE — Telephone Encounter (Signed)
 Copied from CRM [id number]. Topic: Clinical - Medication Question >> Aug 23, 2023 10:16 AM Sonny Dandy B wrote: Reason for CRM: pt called to request a machine that test blood sugar. Pt states his is broken. Please call pt back at [phone]

## 2023-08-24 NOTE — Telephone Encounter (Signed)
 Noted, appreciate Matt, NP assistance.

## 2023-08-25 MED FILL — Fosaprepitant Dimeglumine For IV Infusion 150 MG (Base Eq): INTRAVENOUS | Qty: 5 | Status: AC

## 2023-08-25 NOTE — Telephone Encounter (Signed)
 Patient was identified as falling into the [place] Measure - Diabetes.   Patient was: Left voicemail to schedule with primary care provider.

## 2023-08-28 ENCOUNTER — Inpatient Hospital Stay: Payer: Medicare HMO

## 2023-08-28 ENCOUNTER — Inpatient Hospital Stay: Payer: Medicare HMO | Admitting: Internal Medicine

## 2023-08-28 VITALS — BP 96/73 | HR 99 | Temp 98.1°F | Resp 17 | Ht 73.0 in | Wt 147.1 lb

## 2023-08-28 DIAGNOSIS — C3490 Malignant neoplasm of unspecified part of unspecified bronchus or lung: Secondary | ICD-10-CM

## 2023-08-28 DIAGNOSIS — D509 Iron deficiency anemia, unspecified: Secondary | ICD-10-CM

## 2023-08-28 DIAGNOSIS — R262 Difficulty in walking, not elsewhere classified: Secondary | ICD-10-CM | POA: Diagnosis not present

## 2023-08-28 DIAGNOSIS — D649 Anemia, unspecified: Secondary | ICD-10-CM

## 2023-08-28 DIAGNOSIS — E119 Type 2 diabetes mellitus without complications: Secondary | ICD-10-CM | POA: Diagnosis not present

## 2023-08-28 DIAGNOSIS — Z5112 Encounter for antineoplastic immunotherapy: Secondary | ICD-10-CM | POA: Diagnosis not present

## 2023-08-28 DIAGNOSIS — Z5189 Encounter for other specified aftercare: Secondary | ICD-10-CM | POA: Diagnosis not present

## 2023-08-28 DIAGNOSIS — Z95828 Presence of other vascular implants and grafts: Secondary | ICD-10-CM

## 2023-08-28 DIAGNOSIS — R590 Localized enlarged lymph nodes: Secondary | ICD-10-CM | POA: Diagnosis not present

## 2023-08-28 DIAGNOSIS — I10 Essential (primary) hypertension: Secondary | ICD-10-CM | POA: Diagnosis not present

## 2023-08-28 DIAGNOSIS — C7951 Secondary malignant neoplasm of bone: Secondary | ICD-10-CM | POA: Diagnosis not present

## 2023-08-28 DIAGNOSIS — C3412 Malignant neoplasm of upper lobe, left bronchus or lung: Secondary | ICD-10-CM | POA: Diagnosis not present

## 2023-08-28 DIAGNOSIS — Z5111 Encounter for antineoplastic chemotherapy: Secondary | ICD-10-CM | POA: Diagnosis not present

## 2023-08-28 LAB — CBC WITH DIFFERENTIAL (CANCER CENTER ONLY)
Abs Immature Granulocytes: 0.82 10*3/uL — ABNORMAL HIGH (ref 0.00–0.07)
Basophils Absolute: 0.1 10*3/uL (ref 0.0–0.1)
Basophils Relative: 1 %
Eosinophils Absolute: 0 10*3/uL (ref 0.0–0.5)
Eosinophils Relative: 0 %
HCT: 26.9 % — ABNORMAL LOW (ref 39.0–52.0)
Hemoglobin: 8.5 g/dL — ABNORMAL LOW (ref 13.0–17.0)
Immature Granulocytes: 4 %
Lymphocytes Relative: 7 %
Lymphs Abs: 1.6 10*3/uL (ref 0.7–4.0)
MCH: 32.3 pg (ref 26.0–34.0)
MCHC: 31.6 g/dL (ref 30.0–36.0)
MCV: 102.3 fL — ABNORMAL HIGH (ref 80.0–100.0)
Monocytes Absolute: 1.5 10*3/uL — ABNORMAL HIGH (ref 0.1–1.0)
Monocytes Relative: 7 %
Neutro Abs: 18.1 10*3/uL — ABNORMAL HIGH (ref 1.7–7.7)
Neutrophils Relative %: 81 %
Platelet Count: 190 10*3/uL (ref 150–400)
RBC: 2.63 MIL/uL — ABNORMAL LOW (ref 4.22–5.81)
RDW: 22.7 % — ABNORMAL HIGH (ref 11.5–15.5)
WBC Count: 22.2 10*3/uL — ABNORMAL HIGH (ref 4.0–10.5)
nRBC: 0.5 % — ABNORMAL HIGH (ref 0.0–0.2)

## 2023-08-28 LAB — CMP (CANCER CENTER ONLY)
ALT: 10 U/L (ref 0–44)
AST: 13 U/L — ABNORMAL LOW (ref 15–41)
Albumin: 4 g/dL (ref 3.5–5.0)
Alkaline Phosphatase: 335 U/L — ABNORMAL HIGH (ref 38–126)
Anion gap: 7 (ref 5–15)
BUN: 24 mg/dL — ABNORMAL HIGH (ref 8–23)
CO2: 27 mmol/L (ref 22–32)
Calcium: 9.6 mg/dL (ref 8.9–10.3)
Chloride: 106 mmol/L (ref 98–111)
Creatinine: 1.53 mg/dL — ABNORMAL HIGH (ref 0.61–1.24)
GFR, Estimated: 48 mL/min — ABNORMAL LOW (ref >60)
Glucose, Bld: 99 mg/dL (ref 70–99)
Potassium: 4 mmol/L (ref 3.5–5.1)
Sodium: 140 mmol/L (ref 135–145)
Total Bilirubin: 0.5 mg/dL (ref 0.0–1.2)
Total Protein: 6.6 g/dL (ref 6.5–8.1)

## 2023-08-28 LAB — SAMPLE TO BLOOD BANK

## 2023-08-28 MED ORDER — CARBOPLATIN CHEMO INJECTION 450 MG/45ML
312.0000 mg | Freq: Once | INTRAVENOUS | Status: AC
  Administered 2023-08-28: 310 mg via INTRAVENOUS
  Filled 2023-08-28: qty 31

## 2023-08-28 MED ORDER — FOSAPREPITANT DIMEGLUMINE INJECTION 150 MG
150.0000 mg | Freq: Once | INTRAVENOUS | Status: AC
  Administered 2023-08-28: 150 mg via INTRAVENOUS
  Filled 2023-08-28: qty 150

## 2023-08-28 MED ORDER — FAMOTIDINE IN NACL 20-0.9 MG/50ML-% IV SOLN
20.0000 mg | Freq: Once | INTRAVENOUS | Status: AC
Start: 2023-08-28 — End: 2023-08-28
  Administered 2023-08-28: 20 mg via INTRAVENOUS
  Filled 2023-08-28: qty 50

## 2023-08-28 MED ORDER — SODIUM CHLORIDE 0.9% FLUSH
10.0000 mL | INTRAVENOUS | Status: DC | PRN
  Administered 2023-08-28: 10 mL via INTRAVENOUS

## 2023-08-28 MED ORDER — DIPHENHYDRAMINE HCL 50 MG/ML IJ SOLN
50.0000 mg | Freq: Once | INTRAMUSCULAR | Status: AC
  Administered 2023-08-28: 50 mg via INTRAVENOUS
  Filled 2023-08-28: qty 1

## 2023-08-28 MED ORDER — SODIUM CHLORIDE 0.9 % IV SOLN: INTRAVENOUS | Status: DC

## 2023-08-28 MED ORDER — PALONOSETRON HCL INJECTION 0.25 MG/5ML
0.2500 mg | Freq: Once | INTRAVENOUS | Status: AC
  Administered 2023-08-28: 0.25 mg via INTRAVENOUS
  Filled 2023-08-28: qty 5

## 2023-08-28 MED ORDER — SODIUM CHLORIDE 0.9% FLUSH
10.0000 mL | INTRAVENOUS | Status: DC | PRN
Start: 2023-08-28 — End: 2023-08-28
  Administered 2023-08-28: 10 mL

## 2023-08-28 MED ORDER — SODIUM CHLORIDE 0.9 % IV SOLN
350.0000 mg | Freq: Once | INTRAVENOUS | Status: AC
  Administered 2023-08-28: 350 mg via INTRAVENOUS
  Filled 2023-08-28: qty 7

## 2023-08-28 MED ORDER — DEXAMETHASONE SODIUM PHOSPHATE 10 MG/ML IJ SOLN
10.0000 mg | Freq: Once | INTRAMUSCULAR | Status: AC
  Administered 2023-08-28: 10 mg via INTRAVENOUS
  Filled 2023-08-28: qty 1

## 2023-08-28 MED ORDER — HEPARIN SOD (PORK) LOCK FLUSH 100 UNIT/ML IV SOLN
500.0000 [IU] | Freq: Once | INTRAVENOUS | Status: AC | PRN
  Administered 2023-08-28: 500 [IU]

## 2023-08-28 MED ORDER — SODIUM CHLORIDE 0.9 % IV SOLN
175.0000 mg/m2 | Freq: Once | INTRAVENOUS | Status: AC
  Administered 2023-08-28: 324 mg via INTRAVENOUS
  Filled 2023-08-28: qty 54

## 2023-08-28 NOTE — Patient Instructions (Signed)
 [facility] - A DEPT OF [facility]  Discharge Instructions: Thank you for choosing [facility] to provide your oncology and hematology care.   If you have a lab appointment with the Cancer Center, please go directly to the Cancer Center and check in at the registration area.   Wear comfortable clothing and clothing appropriate for easy access to any Portacath or PICC line.   We strive to give you quality time with your provider. You may need to reschedule your appointment if you arrive late (15 or more minutes).  Arriving late affects you and other patients whose appointments are after yours.  Also, if you miss three or more appointments without notifying the office, you may be dismissed from the clinic at the provider's discretion.      For prescription refill requests, have your pharmacy contact our office and allow 72 hours for refills to be completed.    Today you received the following chemotherapy and/or immunotherapy agents: Libtayo, Taxol, Carboplatin      To help prevent nausea and vomiting after your treatment, we encourage you to take your nausea medication as directed.  BELOW ARE SYMPTOMS THAT SHOULD BE REPORTED IMMEDIATELY: *FEVER GREATER THAN 100.4 F (38 C) OR HIGHER *CHILLS OR SWEATING *NAUSEA AND VOMITING THAT IS NOT CONTROLLED WITH YOUR NAUSEA MEDICATION *UNUSUAL SHORTNESS OF BREATH *UNUSUAL BRUISING OR BLEEDING *URINARY PROBLEMS (pain or burning when urinating, or frequent urination) *BOWEL PROBLEMS (unusual diarrhea, constipation, pain near the anus) TENDERNESS IN MOUTH AND THROAT WITH OR WITHOUT PRESENCE OF ULCERS (sore throat, sores in mouth, or a toothache) UNUSUAL RASH, SWELLING OR PAIN  UNUSUAL VAGINAL DISCHARGE OR ITCHING   Items with * indicate a potential emergency and should be followed up as soon as possible or go to the Emergency Department if any problems should occur.  Please show the CHEMOTHERAPY ALERT CARD  or IMMUNOTHERAPY ALERT CARD at check-in to the Emergency Department and triage nurse.  Should you have questions after your visit or need to cancel or reschedule your appointment, please contact [facility] - A DEPT OF [facility]  Dept: [phone]  and follow the prompts.  Office hours are 8:00 a.m. to 4:30 p.m. Monday - Friday. Please note that voicemails left after 4:00 p.m. may not be returned until the following business day.  We are closed weekends and major holidays. You have access to a nurse at all times for urgent questions. Please call the main number to the clinic Dept: [phone] and follow the prompts.   For any non-urgent questions, you may also contact your provider using MyChart. We now offer e-Visits for anyone 37 and older to request care online for non-urgent symptoms. For details visit [email or web address].   Also download the MyChart app! Go to the app store, search "MyChart", open the app, select [place], and log in with your MyChart username and password.

## 2023-08-28 NOTE — Progress Notes (Signed)
 Per Cassie Heilingoetter, PA ok to treat with Crt 1.53 today 08/28/2023

## 2023-08-28 NOTE — Progress Notes (Signed)
 [facility] [phone]   Fax:(336) [phone]  OFFICE PROGRESS NOTE  Doreene Nest, NP [address] [place] [address]  DIAGNOSIS: Stage IV (T3, N2, M1 C) non-small cell lung cancer, adenocarcinoma presented with large left upper lobe lung mass with left hilar and mediastinal invasion in addition to thoracic inlet lymphadenopathy as well as several metastatic bone lesions involving the spine, ribs as well as the right iliac bone diagnosed in December 2024.   Molecular studies by [id number] showed no actionable mutations and PD-L1 expression was negative.   PRIOR THERAPY: None   CURRENT THERAPY: 1) Palliative systemic chemotherapy and immunotherapy with carboplatin for an AUC of 5, Taxol 175 mg/m, and Libtayo IV every 3 weeks with Neulasta support.  (Not a candidate for Alimta due to CKD).  First dose on 07/03/23.  Status post 2 cycles.  2) radiation to L4 and L5 to the care of Dr. Kathrynn Running. Final treatment expected on 07/21/23  INTERVAL HISTORY: Jonathan Page 73 y.o. male returns to the clinic today for follow-up visit accompanied by his wife.Discussed the use of AI scribe software for clinical note transcription with the patient, who gave verbal consent to proceed.  History of Present Illness   Jonathan Page is a 73 year old male with stage four non-small cell lung cancer who presents for chemotherapy cycle three. He is accompanied by his wife.  He was diagnosed with stage four non-small cell lung cancer adenocarcinoma in December 2024. He has undergone radiation therapy to the L4 and L5 vertebrae and his hip, along with internal fixation of the hip. He is currently receiving chemotherapy with carboplatin, paclitaxel, and Libtayo, having completed two cycles and is here for the third cycle.  No side effects from chemotherapy such as nausea, vomiting, or diarrhea. His weight has remained stable since the last visit, and he reports adequate hydration  and nutrition intake. No recent weight loss.  He has persistent soreness in his hip and difficulty walking, which he manages using a cane at home and a walker to get to the car.  He has ongoing issues with diabetes management and has ordered a device to assist, but encountered issues with missing supplies such as needles and stickers.        MEDICAL HISTORY: Past Medical History:  Diagnosis Date   Allergy    Cancer (HCC)    Cellulitis 02/21/2018   Concussion syndrome    Diabetes (HCC)    Frequent headaches    Hypertension     ALLERGIES:  has no known allergies.  MEDICATIONS:  Current Outpatient Medications  Medication Sig Dispense Refill   aspirin EC 81 MG tablet Take 81 mg by mouth daily.     Blood Glucose Monitoring Suppl (ACCU-CHEK AVIVA PLUS) w/Device KIT Check blood sugar before breakfast, lunch and bedtime and as directed. Dx E11.65 1 kit 0   Blood Glucose Monitoring Suppl DEVI 1 each by Does not apply route as directed. May substitute to any manufacturer covered by patient's insurance. 1 each 0   docusate sodium (COLACE) 100 MG capsule Take 1 capsule (100 mg total) by mouth 2 (two) times daily. 10 capsule 0   empagliflozin (JARDIANCE) 10 MG TABS tablet Take 1 tablet (10 mg total) by mouth daily before breakfast. 30 tablet 3   folic acid (FOLVITE) 1 MG tablet Take 1 mg by mouth in the morning.     gabapentin (NEURONTIN) 300 MG capsule Take 1  capsule (300 mg total) by mouth at bedtime. (Patient taking differently: Take 300 mg by mouth in the morning.) 30 capsule 1   glipiZIDE (GLUCOTROL XL) 10 MG 24 hr tablet Take 1 tablet (10 mg total) by mouth daily with breakfast. for diabetes. 90 tablet 3   glucose blood (ACCU-CHEK AVIVA PLUS) test strip Check blood sugar before breakfast, lunch and bedtime and as directed. Dx E11.65 300 each 1   insulin NPH-regular Human (70-30) 100 UNIT/ML injection Inject 5 Units into the skin daily with breakfast.     Lancets (ACCU-CHEK SOFT TOUCH)  lancets Check blood sugar before breakfast, lunch and bedtime and as directed. Dx E11.65 300 each 1   lidocaine-prilocaine (EMLA) cream Apply 1 Application topically as needed. 30 g 1   lisinopril (ZESTRIL) 10 MG tablet Take 1 tablet (10 mg total) by mouth daily. for blood pressure. 90 tablet 0   methocarbamol (ROBAXIN) 500 MG tablet Take 1 tablet (500 mg total) by mouth every 8 (eight) hours as needed for muscle spasms. 30 tablet 0   ondansetron (ZOFRAN) 8 MG tablet Take 8 mg by mouth every 8 (eight) hours as needed for nausea or vomiting.     oxyCODONE (OXY IR/ROXICODONE) 5 MG immediate release tablet Take 1 tablet (5 mg total) by mouth every 4 (four) hours as needed for moderate pain (pain score 4-6). 60 tablet 0   prochlorperazine (COMPAZINE) 10 MG tablet Take 1 tablet (10 mg total) by mouth every 6 (six) hours as needed for nausea or vomiting. 30 tablet 0   No current facility-administered medications for this visit.   Facility-Administered Medications Ordered in Other Visits  Medication Dose Route Frequency Provider Last Rate Last Admin   sodium chloride flush (NS) 0.9 % injection 10 mL  10 mL Intravenous PRN Si Gaul, MD   10 mL at 08/28/23 0809    SURGICAL HISTORY:  Past Surgical History:  Procedure Laterality Date   APPENDECTOMY  1960   BRONCHIAL BIOPSY  06/12/2023   Procedure: BRONCHIAL BIOPSIES;  Surgeon: Josephine Igo, DO;  Location: [facility];  Service: Cardiopulmonary;;   BRONCHIAL BRUSHINGS  06/12/2023   Procedure: BRONCHIAL BRUSHINGS;  Surgeon: Josephine Igo, DO;  Location: [facility];  Service: Cardiopulmonary;;   COLONOSCOPY     FEMUR IM NAIL Left 07/25/2023   Procedure: LEFT HIP INTRAMEDULLARY HIP SCREW;  Surgeon: Cammy Copa, MD;  Location: [facility] OR;  Service: Orthopedics;  Laterality: Left;   HIP SURGERY Left 07/25/2023   metal pin placed in L hip area   IR IMAGING GUIDED PORT INSERTION  07/13/2023   VIDEO BRONCHOSCOPY Bilateral 06/12/2023    Procedure: VIDEO BRONCHOSCOPY;  Surgeon: Josephine Igo, DO;  Location: [facility];  Service: Cardiopulmonary;  Laterality: Bilateral;   WISDOM TOOTH EXTRACTION      REVIEW OF SYSTEMS:  Constitutional: positive for fatigue Eyes: negative Ears, nose, mouth, throat, and face: negative Respiratory: negative Cardiovascular: negative Gastrointestinal: negative Genitourinary:negative Integument/breast: negative Hematologic/lymphatic: negative Musculoskeletal:positive for bone pain Neurological: negative Behavioral/Psych: negative Endocrine: negative Allergic/Immunologic: negative   PHYSICAL EXAMINATION: General appearance: alert, cooperative, fatigued, and no distress Head: Normocephalic, without obvious abnormality, atraumatic Neck: no adenopathy, no JVD, supple, symmetrical, trachea midline, and thyroid not enlarged, symmetric, no tenderness/mass/nodules Lymph nodes: Cervical, supraclavicular, and axillary nodes normal. Resp: normal percussion bilaterally Back: symmetric, no curvature. ROM normal. No CVA tenderness. Cardio: regular rate and rhythm, S1, S2 normal, no murmur, click, rub or gallop GI: soft, non-tender; bowel sounds normal; no masses,  no organomegaly Extremities: extremities normal, atraumatic, no cyanosis or edema Neurologic: Alert and oriented X 3, normal strength and tone. Normal symmetric reflexes. Normal coordination and gait  ECOG PERFORMANCE STATUS: 1 - Symptomatic but completely ambulatory  Blood pressure 96/73, pulse 99, temperature 98.1 F (36.7 C), temperature source Temporal, resp. rate 17, height 6\' 1"  (1.854 m), weight 147 lb 1.6 oz (66.7 kg), SpO2 97%.  LABORATORY DATA: Lab Results  Component Value Date   WBC 31.4 (H) 08/21/2023   HGB 9.2 (L) 08/21/2023   HCT 29.1 (L) 08/21/2023   MCV 100.7 (H) 08/21/2023   PLT 149 (L) 08/21/2023      Chemistry      Component Value Date/Time   NA 138 08/21/2023 0736   K 4.1 08/21/2023 0736   CL 104  08/21/2023 0736   CO2 26 08/21/2023 0736   BUN 21 08/21/2023 0736   CREATININE 1.48 (H) 08/21/2023 0736      Component Value Date/Time   CALCIUM 9.5 08/21/2023 0736   ALKPHOS 373 (H) 08/21/2023 0736   AST 17 08/21/2023 0736   ALT 11 08/21/2023 0736   BILITOT 0.4 08/21/2023 0736       RADIOGRAPHIC STUDIES: XR FEMUR MIN 2 VIEWS LEFT Result Date: 08/11/2023 AP and lateral views of left femur reviewed.  No fracture or hardware complication noted.  Left intramedullary nail remains in good position and alignment without any complicating features.  No significant change compared with prior radiographs from time of surgery.   ASSESSMENT AND PLAN: This is a very pleasant 74 years old African-American male with Stage IV (T3, N2, M1 C) non-small cell lung cancer, adenocarcinoma presented with large left upper lobe lung mass with left hilar and mediastinal invasion in addition to thoracic inlet lymphadenopathy as well as several metastatic bone lesions involving the spine, ribs as well as the right iliac bone diagnosed in December 2024. He completed a course of palliative radiotherapy to the L4-5 under the care of Dr. Kathrynn Running. The patient is currently undergoing systemic chemotherapy with carboplatin for AUC of 5, paclitaxel 175 Mg/M2 and Keytruda 200 Mg IV every 3 weeks status post 2 cycles.  He has not a candidate for treatment with pemetrexed because of his renal insufficiency.  He has been tolerating this treatment fairly well.    Stage IV non-small cell lung cancer (adenocarcinoma) Diagnosed in December 2024. Currently receiving chemotherapy with carboplatin, paclitaxel, and Libtayo. Completed two cycles, with the third cycle scheduled today. No significant side effects reported from chemotherapy. Radiation therapy was previously administered to L4, L5, and the hip, with internal fixation of the hip. Experiences soreness and difficulty walking, using a cane and walker for mobility. A CT scan of the  chest, abdomen, and pelvis is planned in two weeks to assess treatment response and guide further management. - Proceed with chemotherapy cycle 3 today - Order CT scan of chest, abdomen, and pelvis in two weeks to assess treatment response - Evaluate scan results and determine further treatment plan in three weeks  Anemia Hemoglobin levels are above the threshold for transfusion (8.5 g/dL). No immediate need for blood transfusion unless hemoglobin drops below 8 g/dL. - Monitor hemoglobin levels - Consider blood transfusion if hemoglobin drops below 8 g/dL  Physical therapy for hip and leg strength Considering stopping physical therapy but [place] a [employer] and can continue exercises independently. Encouraged to continue physical therapy for guidance before transitioning to independent exercise. - Encourage continuation of physical therapy for guidance - Consider  transitioning to independent exercise at [facility] after physical therapy  Diabetes management issues Reports issues with diabetes management and difficulties obtaining necessary supplies for a device. Advised to consult with primary care provider for diabetes management and supply issues. - Refer to primary care provider for diabetes management and supply issues   The patient was advised to call immediately if he has any concerning symptoms in the interval.  The patient voices understanding of current disease status and treatment options and is in agreement with the current care plan.  All questions were answered. The patient knows to call the clinic with any problems, questions or concerns. We can certainly see the patient much sooner if necessary.  The total time spent in the appointment was 30 minutes.  Disclaimer: This note was dictated with voice recognition software. Similar sounding words can inadvertently be transcribed and may not be corrected upon review.

## 2023-08-29 ENCOUNTER — Other Ambulatory Visit: Payer: Self-pay | Admitting: Primary Care

## 2023-08-29 ENCOUNTER — Inpatient Hospital Stay

## 2023-08-29 DIAGNOSIS — E1165 Type 2 diabetes mellitus with hyperglycemia: Secondary | ICD-10-CM

## 2023-08-29 NOTE — Progress Notes (Deleted)
 Palliative Medicine [facility]  Telephone:(336) [phone] Fax:(336) [phone]   Name: Jonathan Page Date: 08/29/2023 MRN: [id number]  DOB: [birth year]  Patient Care Team: Doreene Nest, NP as PCP - General (Internal Medicine) Lytle Butte, RN as Oncology Nurse Navigator Pa,  Eye Care (Optometry) Pickenpack-Cousar, Arty Baumgartner, NP as Nurse Practitioner [facility] and Palliative Medicine)    INTERVAL HISTORY: Jonathan Page is a 73 y.o. male with oncologic medical history including non-small cell lung cancer (06/2023) with metastatic disease to spine and widespread bone. As well as a history of diabetes, hypertension, and seasonal allergies and a 45lb weight loss in the last 3-4 months. Palliative ask to see for symptom management and goals of care.   SOCIAL HISTORY:     reports that he quit smoking about 4 years ago. His smoking use included cigarettes and cigars. He started smoking about 34 years ago. He has a 15 pack-year smoking history. He has never used smokeless tobacco. He reports current alcohol use of about 6.0 standard drinks of alcohol per week. He reports that he does not use drugs.  ADVANCE DIRECTIVES:  None on file  CODE STATUS: Full code  PAST MEDICAL HISTORY: Past Medical History:  Diagnosis Date   Allergy    Cancer (HCC)    Cellulitis 02/21/2018   Concussion syndrome    Diabetes (HCC)    Frequent headaches    Hypertension     ALLERGIES:  has no known allergies.  MEDICATIONS:  Current Outpatient Medications  Medication Sig Dispense Refill   aspirin EC 81 MG tablet Take 81 mg by mouth daily.     Blood Glucose Monitoring Suppl (ACCU-CHEK AVIVA PLUS) w/Device KIT Check blood sugar before breakfast, lunch and bedtime and as directed. Dx E11.65 1 kit 0   Blood Glucose Monitoring Suppl DEVI 1 each by Does not apply route as directed. May substitute to any manufacturer covered by patient's insurance. 1 each 0   docusate sodium  (COLACE) 100 MG capsule Take 1 capsule (100 mg total) by mouth 2 (two) times daily. 10 capsule 0   empagliflozin (JARDIANCE) 10 MG TABS tablet Take 1 tablet (10 mg total) by mouth daily before breakfast. 30 tablet 3   folic acid (FOLVITE) 1 MG tablet Take 1 mg by mouth in the morning.     gabapentin (NEURONTIN) 300 MG capsule Take 1 capsule (300 mg total) by mouth at bedtime. (Patient taking differently: Take 300 mg by mouth in the morning.) 30 capsule 1   glipiZIDE (GLUCOTROL XL) 10 MG 24 hr tablet Take 1 tablet (10 mg total) by mouth daily with breakfast. for diabetes. 90 tablet 3   glucose blood (ACCU-CHEK AVIVA PLUS) test strip Check blood sugar before breakfast, lunch and bedtime and as directed. Dx E11.65 300 each 1   insulin NPH-regular Human (70-30) 100 UNIT/ML injection Inject 5 Units into the skin daily with breakfast.     Lancets (ACCU-CHEK SOFT TOUCH) lancets Check blood sugar before breakfast, lunch and bedtime and as directed. Dx E11.65 300 each 1   lidocaine-prilocaine (EMLA) cream Apply 1 Application topically as needed. 30 g 1   lisinopril (ZESTRIL) 10 MG tablet Take 1 tablet (10 mg total) by mouth daily. for blood pressure. 90 tablet 0   methocarbamol (ROBAXIN) 500 MG tablet Take 1 tablet (500 mg total) by mouth every 8 (eight) hours as needed for muscle spasms. 30 tablet 0   ondansetron (ZOFRAN) 8 MG tablet Take 8 mg  by mouth every 8 (eight) hours as needed for nausea or vomiting.     oxyCODONE (OXY IR/ROXICODONE) 5 MG immediate release tablet Take 1 tablet (5 mg total) by mouth every 4 (four) hours as needed for moderate pain (pain score 4-6). 60 tablet 0   prochlorperazine (COMPAZINE) 10 MG tablet Take 1 tablet (10 mg total) by mouth every 6 (six) hours as needed for nausea or vomiting. 30 tablet 0   No current facility-administered medications for this visit.    VITAL SIGNS: There were no vitals taken for this visit. There were no vitals filed for this visit.  Estimated  body mass index is 19.41 kg/m as calculated from the following:   Height as of 08/28/23: 6\' 1"  (1.854 m).   Weight as of 08/28/23: 147 lb 1.6 oz (66.7 kg).   PERFORMANCE STATUS (ECOG) : 1 - Symptomatic but completely ambulatory   Physical Exam General: NAD Cardiovascular: regular rate and rhythm Pulmonary: clear ant fields Abdomen: soft, nontender, + bowel sounds Extremities: no edema, no joint deformities Skin: no rashes Neurological:   IMPRESSION: ***  We discussed Her current illness and what it means in the larger context of Her on-going co-morbidities. Natural disease trajectory and expectations were discussed.  I discussed the importance of continued conversation with family and their medical providers regarding overall plan of care and treatment options, ensuring decisions are within the context of the patients values and GOCs.  PLAN: Cancer Related Pain Management Patient reports taking Oxycodone and Gabapentin for pain management. No new complaints of pain. Pain controlled overall. Some days better than others.  -Continue current regimen of Oxycodone 5/325mg  every 8 hours  -Continue Gabapentin 300mg  at bedtime, can increase to twice daily if pain worsens.  -Contact office for refills as needed.  Constipation Patient reports constipation, managed with prune juice. -Continue current home remedy for constipation. -If prune juice is no longer effective will need to consider use of daily stool softeners.   Radiation Therapy Patient is currently undergoing radiation therapy. No new complaints related to therapy. -Continue current radiation therapy regimen.  General Health Maintenance -Continue to monitor symptoms and report any changes. -Follow-up appointments as scheduled. -I will plan to see patient back in 2-3 weeks for follow-up.  Patient expressed understanding and was in agreement with this plan. He also understands that He can call the clinic at any time with any  questions, concerns, or complaints.   Any controlled substances utilized were prescribed in the context of palliative care. PDMP has been reviewed.    Visit consisted of counseling and education dealing with the complex and emotionally intense issues of symptom management and palliative care in the setting of serious and potentially life-threatening illness.  Willette Alma, AGPCNP-BC  Palliative Medicine Team/[facility]

## 2023-08-29 NOTE — Telephone Encounter (Unsigned)
 Copied from CRM [id number]. Topic: Clinical - Medication Refill >> Aug 29, 2023  9:51 AM Isabell A wrote: Most Recent Primary Care Visit:  Provider: Tillman Abide I  Department: [facility]  Visit Type: OFFICE VISIT  Date: 05/29/2023  Medication: glucose blood (ACCU-CHEK AVIVA PLUS) test strip  Has the patient contacted their pharmacy? Yes (Agent: If no, request that the patient contact the pharmacy for the refill. If patient does not wish to contact the pharmacy document the reason why and proceed with request.) (Agent: If yes, when and what did the pharmacy advise?)  Is this the correct pharmacy for this prescription? Yes If no, delete pharmacy and type the correct one.  This is the patient's preferred pharmacy:  [facility] [address], [place] - [address] [place] [address] Phone: [phone] Fax: [phone]   Has the prescription been filled recently? Yes  Is the patient out of the medication? Yes  Has the patient been seen for an appointment in the last year OR does the patient have an upcoming appointment? Yes  Can we respond through MyChart? No  Agent: Please be advised that Rx refills may take up to 3 business days. We ask that you follow-up with your pharmacy.

## 2023-08-30 ENCOUNTER — Inpatient Hospital Stay

## 2023-08-30 ENCOUNTER — Inpatient Hospital Stay: Admitting: Nurse Practitioner

## 2023-08-30 ENCOUNTER — Encounter

## 2023-08-30 ENCOUNTER — Ambulatory Visit: Payer: Medicare HMO

## 2023-08-30 VITALS — BP 93/60 | HR 107 | Temp 98.3°F | Resp 18

## 2023-08-30 DIAGNOSIS — C3412 Malignant neoplasm of upper lobe, left bronchus or lung: Secondary | ICD-10-CM | POA: Diagnosis not present

## 2023-08-30 DIAGNOSIS — C7951 Secondary malignant neoplasm of bone: Secondary | ICD-10-CM | POA: Diagnosis not present

## 2023-08-30 DIAGNOSIS — Z5189 Encounter for other specified aftercare: Secondary | ICD-10-CM | POA: Diagnosis not present

## 2023-08-30 DIAGNOSIS — I10 Essential (primary) hypertension: Secondary | ICD-10-CM | POA: Diagnosis not present

## 2023-08-30 DIAGNOSIS — R590 Localized enlarged lymph nodes: Secondary | ICD-10-CM | POA: Diagnosis not present

## 2023-08-30 DIAGNOSIS — R262 Difficulty in walking, not elsewhere classified: Secondary | ICD-10-CM | POA: Diagnosis not present

## 2023-08-30 DIAGNOSIS — Z5112 Encounter for antineoplastic immunotherapy: Secondary | ICD-10-CM | POA: Diagnosis not present

## 2023-08-30 DIAGNOSIS — C3490 Malignant neoplasm of unspecified part of unspecified bronchus or lung: Secondary | ICD-10-CM

## 2023-08-30 DIAGNOSIS — Z5111 Encounter for antineoplastic chemotherapy: Secondary | ICD-10-CM | POA: Diagnosis not present

## 2023-08-30 DIAGNOSIS — E119 Type 2 diabetes mellitus without complications: Secondary | ICD-10-CM | POA: Diagnosis not present

## 2023-08-30 MED ORDER — PEGFILGRASTIM-CBQV 6 MG/0.6ML ~~LOC~~ SOSY
6.0000 mg | PREFILLED_SYRINGE | Freq: Once | SUBCUTANEOUS | Status: AC
  Administered 2023-08-30: 6 mg via SUBCUTANEOUS
  Filled 2023-08-30: qty 0.6

## 2023-08-30 MED ORDER — ACCU-CHEK AVIVA PLUS VI STRP
ORAL_STRIP | 1 refills | Status: DC
Start: 2023-08-30 — End: 2023-11-16

## 2023-08-31 ENCOUNTER — Telehealth: Payer: Self-pay | Admitting: Orthopedic Surgery

## 2023-08-31 DIAGNOSIS — E119 Type 2 diabetes mellitus without complications: Secondary | ICD-10-CM | POA: Diagnosis not present

## 2023-08-31 DIAGNOSIS — F0781 Postconcussional syndrome: Secondary | ICD-10-CM | POA: Diagnosis not present

## 2023-08-31 DIAGNOSIS — Z794 Long term (current) use of insulin: Secondary | ICD-10-CM | POA: Diagnosis not present

## 2023-08-31 DIAGNOSIS — I10 Essential (primary) hypertension: Secondary | ICD-10-CM | POA: Diagnosis not present

## 2023-08-31 DIAGNOSIS — Z7982 Long term (current) use of aspirin: Secondary | ICD-10-CM | POA: Diagnosis not present

## 2023-08-31 DIAGNOSIS — C7951 Secondary malignant neoplasm of bone: Secondary | ICD-10-CM | POA: Diagnosis not present

## 2023-08-31 DIAGNOSIS — Z87891 Personal history of nicotine dependence: Secondary | ICD-10-CM | POA: Diagnosis not present

## 2023-08-31 DIAGNOSIS — C3490 Malignant neoplasm of unspecified part of unspecified bronchus or lung: Secondary | ICD-10-CM | POA: Diagnosis not present

## 2023-08-31 DIAGNOSIS — M84552D Pathological fracture in neoplastic disease, left femur, subsequent encounter for fracture with routine healing: Secondary | ICD-10-CM | POA: Diagnosis not present

## 2023-08-31 NOTE — Telephone Encounter (Signed)
 Mark from [place] called. Patient's HR was 102. Cb# [phone]

## 2023-09-01 ENCOUNTER — Telehealth: Payer: Self-pay

## 2023-09-01 ENCOUNTER — Telehealth: Payer: Self-pay | Admitting: Orthopedic Surgery

## 2023-09-01 ENCOUNTER — Other Ambulatory Visit: Payer: Self-pay | Admitting: Surgical

## 2023-09-01 MED ORDER — OXYCODONE HCL 5 MG PO TABS: 5.0000 mg | ORAL_TABLET | Freq: Three times a day (TID) | ORAL | 0 refills | Status: DC | PRN

## 2023-09-01 NOTE — Telephone Encounter (Signed)
Sent in refill for oxycodone

## 2023-09-01 NOTE — Telephone Encounter (Signed)
 Patient called. Would like oxycodone called in for him.

## 2023-09-01 NOTE — Telephone Encounter (Signed)
 Received phone call from patient in regards to refill on Oxycodone.  Secure chat message sent to [place], NP. Informed patient I have relayed the message to appropriate provider.

## 2023-09-04 ENCOUNTER — Inpatient Hospital Stay

## 2023-09-04 ENCOUNTER — Encounter: Payer: Self-pay | Admitting: Nurse Practitioner

## 2023-09-04 ENCOUNTER — Other Ambulatory Visit: Payer: Self-pay | Admitting: Physician Assistant

## 2023-09-04 ENCOUNTER — Inpatient Hospital Stay (HOSPITAL_BASED_OUTPATIENT_CLINIC_OR_DEPARTMENT_OTHER): Admitting: Nurse Practitioner

## 2023-09-04 ENCOUNTER — Ambulatory Visit: Payer: Medicare HMO

## 2023-09-04 ENCOUNTER — Other Ambulatory Visit: Payer: Medicare HMO

## 2023-09-04 ENCOUNTER — Inpatient Hospital Stay: Payer: Medicare HMO

## 2023-09-04 ENCOUNTER — Ambulatory Visit: Payer: Medicare HMO | Admitting: Internal Medicine

## 2023-09-04 VITALS — BP 91/60 | HR 101 | Temp 98.4°F | Resp 17

## 2023-09-04 DIAGNOSIS — D649 Anemia, unspecified: Secondary | ICD-10-CM

## 2023-09-04 DIAGNOSIS — C7951 Secondary malignant neoplasm of bone: Secondary | ICD-10-CM

## 2023-09-04 DIAGNOSIS — I10 Essential (primary) hypertension: Secondary | ICD-10-CM | POA: Diagnosis not present

## 2023-09-04 DIAGNOSIS — R530 Neoplastic (malignant) related fatigue: Secondary | ICD-10-CM

## 2023-09-04 DIAGNOSIS — Z5112 Encounter for antineoplastic immunotherapy: Secondary | ICD-10-CM | POA: Diagnosis not present

## 2023-09-04 DIAGNOSIS — C3412 Malignant neoplasm of upper lobe, left bronchus or lung: Secondary | ICD-10-CM | POA: Diagnosis not present

## 2023-09-04 DIAGNOSIS — Z95828 Presence of other vascular implants and grafts: Secondary | ICD-10-CM

## 2023-09-04 DIAGNOSIS — Z515 Encounter for palliative care: Secondary | ICD-10-CM | POA: Diagnosis not present

## 2023-09-04 DIAGNOSIS — E119 Type 2 diabetes mellitus without complications: Secondary | ICD-10-CM | POA: Diagnosis not present

## 2023-09-04 DIAGNOSIS — Z5111 Encounter for antineoplastic chemotherapy: Secondary | ICD-10-CM | POA: Diagnosis not present

## 2023-09-04 DIAGNOSIS — R590 Localized enlarged lymph nodes: Secondary | ICD-10-CM | POA: Diagnosis not present

## 2023-09-04 DIAGNOSIS — C3490 Malignant neoplasm of unspecified part of unspecified bronchus or lung: Secondary | ICD-10-CM | POA: Diagnosis not present

## 2023-09-04 DIAGNOSIS — R262 Difficulty in walking, not elsewhere classified: Secondary | ICD-10-CM | POA: Diagnosis not present

## 2023-09-04 DIAGNOSIS — Z5189 Encounter for other specified aftercare: Secondary | ICD-10-CM | POA: Diagnosis not present

## 2023-09-04 DIAGNOSIS — D509 Iron deficiency anemia, unspecified: Secondary | ICD-10-CM

## 2023-09-04 DIAGNOSIS — D6481 Anemia due to antineoplastic chemotherapy: Secondary | ICD-10-CM

## 2023-09-04 LAB — CBC WITH DIFFERENTIAL (CANCER CENTER ONLY)
Abs Immature Granulocytes: 1.76 10*3/uL — ABNORMAL HIGH (ref 0.00–0.07)
Basophils Absolute: 0.1 10*3/uL (ref 0.0–0.1)
Basophils Relative: 0 %
Eosinophils Absolute: 0.1 10*3/uL (ref 0.0–0.5)
Eosinophils Relative: 0 %
HCT: 22.8 % — ABNORMAL LOW (ref 39.0–52.0)
Hemoglobin: 7.2 g/dL — ABNORMAL LOW (ref 13.0–17.0)
Immature Granulocytes: 6 %
Lymphocytes Relative: 7 %
Lymphs Abs: 2.2 10*3/uL (ref 0.7–4.0)
MCH: 32.4 pg (ref 26.0–34.0)
MCHC: 31.6 g/dL (ref 30.0–36.0)
MCV: 102.7 fL — ABNORMAL HIGH (ref 80.0–100.0)
Monocytes Absolute: 2.5 10*3/uL — ABNORMAL HIGH (ref 0.1–1.0)
Monocytes Relative: 8 %
Neutro Abs: 25 10*3/uL — ABNORMAL HIGH (ref 1.7–7.7)
Neutrophils Relative %: 79 %
Platelet Count: 165 10*3/uL (ref 150–400)
RBC: 2.22 MIL/uL — ABNORMAL LOW (ref 4.22–5.81)
RDW: 21.3 % — ABNORMAL HIGH (ref 11.5–15.5)
Smear Review: NORMAL
WBC Count: 31.7 10*3/uL — ABNORMAL HIGH (ref 4.0–10.5)
nRBC: 0.5 % — ABNORMAL HIGH (ref 0.0–0.2)

## 2023-09-04 LAB — CMP (CANCER CENTER ONLY)
ALT: 10 U/L (ref 0–44)
AST: 12 U/L — ABNORMAL LOW (ref 15–41)
Albumin: 4 g/dL (ref 3.5–5.0)
Alkaline Phosphatase: 348 U/L — ABNORMAL HIGH (ref 38–126)
Anion gap: 9 (ref 5–15)
BUN: 18 mg/dL (ref 8–23)
CO2: 26 mmol/L (ref 22–32)
Calcium: 10.2 mg/dL (ref 8.9–10.3)
Chloride: 105 mmol/L (ref 98–111)
Creatinine: 1.25 mg/dL — ABNORMAL HIGH (ref 0.61–1.24)
GFR, Estimated: >60 mL/min (ref >60)
Glucose, Bld: 180 mg/dL — ABNORMAL HIGH (ref 70–99)
Potassium: 4.1 mmol/L (ref 3.5–5.1)
Sodium: 140 mmol/L (ref 135–145)
Total Bilirubin: 0.4 mg/dL (ref 0.0–1.2)
Total Protein: 6.7 g/dL (ref 6.5–8.1)

## 2023-09-04 LAB — SAMPLE TO BLOOD BANK

## 2023-09-04 LAB — PREPARE RBC (CROSSMATCH)

## 2023-09-04 MED ORDER — DIPHENHYDRAMINE HCL 25 MG PO CAPS
50.0000 mg | ORAL_CAPSULE | Freq: Once | ORAL | Status: AC
  Administered 2023-09-04: 50 mg via ORAL
  Filled 2023-09-04: qty 2

## 2023-09-04 MED ORDER — ACETAMINOPHEN 325 MG PO TABS
650.0000 mg | ORAL_TABLET | Freq: Once | ORAL | Status: AC
Start: 2023-09-04 — End: 2023-09-04
  Administered 2023-09-04: 650 mg via ORAL
  Filled 2023-09-04: qty 2

## 2023-09-04 MED ORDER — SODIUM CHLORIDE 0.9 % IV SOLN
300.0000 mg | Freq: Once | INTRAVENOUS | Status: AC
  Administered 2023-09-04: 300 mg via INTRAVENOUS
  Filled 2023-09-04: qty 300

## 2023-09-04 MED ORDER — SODIUM CHLORIDE 0.9% FLUSH
10.0000 mL | INTRAVENOUS | Status: DC | PRN
  Administered 2023-09-04: 10 mL via INTRAVENOUS

## 2023-09-04 MED ORDER — HEPARIN SOD (PORK) LOCK FLUSH 100 UNIT/ML IV SOLN
500.0000 [IU] | Freq: Once | INTRAVENOUS | Status: AC | PRN
  Administered 2023-09-04: 500 [IU]

## 2023-09-04 MED ORDER — ALTEPLASE 2 MG IJ SOLR
2.0000 mg | Freq: Once | INTRAMUSCULAR | Status: DC | PRN
Start: 2023-09-04 — End: 2023-09-04

## 2023-09-04 MED ORDER — SODIUM CHLORIDE 0.9% FLUSH
10.0000 mL | INTRAVENOUS | Status: DC | PRN
Start: 2023-09-04 — End: 2023-09-04
  Administered 2023-09-04: 10 mL via INTRAVENOUS

## 2023-09-04 MED ORDER — HEPARIN SOD (PORK) LOCK FLUSH 100 UNIT/ML IV SOLN
250.0000 [IU] | Freq: Once | INTRAVENOUS | Status: DC | PRN
Start: 2023-09-04 — End: 2023-09-04

## 2023-09-04 MED ORDER — SODIUM CHLORIDE 0.9 % IV SOLN: INTRAVENOUS | Status: DC

## 2023-09-04 NOTE — Patient Instructions (Signed)

## 2023-09-04 NOTE — Progress Notes (Signed)
 Palliative Medicine [facility]  [phone] [phone]   Name: Jonathan Page Date: 09/04/2023 MRN: [id number]  DOB: [birth year]  Patient Care Team: Doreene Nest, NP as PCP - General (Internal Medicine) Lytle Butte, RN as Oncology Nurse Navigator Pa, [facility] (Optometry) Pickenpack-Cousar, Arty Baumgartner, NP as Nurse Practitioner (Hospice and Palliative Medicine)    REASON FOR CONSULTATION: Jonathan Page is a 73 y.o. male with oncologic medical history including non-small cell lung cancer (06/2023) with metastatic disease to spine and widespread bone. As well as a history of diabetes, hypertension, and seasonal allergies and a 45lb weight loss in the last 3-4 months. Palliative ask to see for symptom management and goals of care.    SOCIAL HISTORY:     reports that he quit smoking about 4 years ago. His smoking use included cigarettes and cigars. He started smoking about 34 years ago. He has a 15 pack-year smoking history. He has never used smokeless tobacco. He reports current alcohol use of about 6.0 standard drinks of alcohol per week. He reports that he does not use drugs.  ADVANCE DIRECTIVES:    CODE STATUS: Full code  PAST MEDICAL HISTORY: Past Medical History:  Diagnosis Date   Allergy    Cancer (HCC)    Cellulitis 02/21/2018   Concussion syndrome    Diabetes (HCC)    Frequent headaches    Hypertension     PAST SURGICAL HISTORY:  Past Surgical History:  Procedure Laterality Date   APPENDECTOMY  1960   BRONCHIAL BIOPSY  06/12/2023   Procedure: BRONCHIAL BIOPSIES;  Surgeon: Josephine Igo, DO;  Location: [facility];  Service: Cardiopulmonary;;   BRONCHIAL BRUSHINGS  06/12/2023   Procedure: BRONCHIAL BRUSHINGS;  Surgeon: Josephine Igo, DO;  Location: [facility];  Service: Cardiopulmonary;;   COLONOSCOPY     FEMUR IM NAIL Left 07/25/2023   Procedure: LEFT HIP INTRAMEDULLARY HIP SCREW;  Surgeon: Cammy Copa, MD;  Location: [facility] OR;  Service: Orthopedics;  Laterality: Left;   HIP SURGERY Left 07/25/2023   metal pin placed in L hip area   IR IMAGING GUIDED PORT INSERTION  07/13/2023   VIDEO BRONCHOSCOPY Bilateral 06/12/2023   Procedure: VIDEO BRONCHOSCOPY;  Surgeon: Josephine Igo, DO;  Location: [facility];  Service: Cardiopulmonary;  Laterality: Bilateral;   WISDOM TOOTH EXTRACTION      HEMATOLOGY/ONCOLOGY HISTORY:  Oncology History  Non-small cell lung cancer metastatic to bone (HCC)  06/22/2023 Initial Diagnosis   Non-small cell lung cancer metastatic to bone (HCC)   07/03/2023 - 07/03/2023 Chemotherapy   Patient is on Treatment Plan : LUNG Carboplatin (5) + Pemetrexed (500) + Pembrolizumab (200) D1 q21d Induction x 4 cycles / Maintenance Pemetrexed (500) + Pembrolizumab (200) D1 q21d     07/03/2023 -  Chemotherapy   Patient is on Treatment Plan : LUNG Carboplatin + Paclitaxel q21d Dose Reduction       ALLERGIES:  has no known allergies.  MEDICATIONS:  Current Outpatient Medications  Medication Sig Dispense Refill   aspirin EC 81 MG tablet Take 81 mg by mouth daily.     Blood Glucose Monitoring Suppl (ACCU-CHEK AVIVA PLUS) w/Device KIT Check blood sugar before breakfast, lunch and bedtime and as directed. Dx E11.65 1 kit 0   Blood Glucose Monitoring Suppl DEVI 1 each by Does not apply route as directed. May substitute to any manufacturer covered by patient's insurance. 1 each 0   docusate sodium (COLACE)  100 MG capsule Take 1 capsule (100 mg total) by mouth 2 (two) times daily. 10 capsule 0   empagliflozin (JARDIANCE) 10 MG TABS tablet Take 1 tablet (10 mg total) by mouth daily before breakfast. 30 tablet 3   folic acid (FOLVITE) 1 MG tablet Take 1 mg by mouth in the morning.     gabapentin (NEURONTIN) 300 MG capsule Take 1 capsule (300 mg total) by mouth at bedtime. (Patient taking differently: Take 300 mg by mouth in the morning.) 30 capsule 1   glipiZIDE (GLUCOTROL XL)  10 MG 24 hr tablet Take 1 tablet (10 mg total) by mouth daily with breakfast. for diabetes. 90 tablet 3   glucose blood (ACCU-CHEK AVIVA PLUS) test strip Check blood sugar before breakfast, lunch and bedtime and as directed. Dx E11.65 300 each 1   insulin NPH-regular Human (70-30) 100 UNIT/ML injection Inject 5 Units into the skin daily with breakfast.     Lancets (ACCU-CHEK SOFT TOUCH) lancets Check blood sugar before breakfast, lunch and bedtime and as directed. Dx E11.65 300 each 1   lidocaine-prilocaine (EMLA) cream Apply 1 Application topically as needed. 30 g 1   lisinopril (ZESTRIL) 10 MG tablet Take 1 tablet (10 mg total) by mouth daily. for blood pressure. 90 tablet 0   methocarbamol (ROBAXIN) 500 MG tablet Take 1 tablet (500 mg total) by mouth every 8 (eight) hours as needed for muscle spasms. 30 tablet 0   ondansetron (ZOFRAN) 8 MG tablet Take 8 mg by mouth every 8 (eight) hours as needed for nausea or vomiting.     oxyCODONE (OXY IR/ROXICODONE) 5 MG immediate release tablet Take 1 tablet (5 mg total) by mouth every 8 (eight) hours as needed for moderate pain (pain score 4-6). 30 tablet 0   prochlorperazine (COMPAZINE) 10 MG tablet Take 1 tablet (10 mg total) by mouth every 6 (six) hours as needed for nausea or vomiting. 30 tablet 0   No current facility-administered medications for this visit.   Facility-Administered Medications Ordered in Other Visits  Medication Dose Route Frequency Provider Last Rate Last Admin   0.9 %  sodium chloride infusion   Intravenous Continuous Heilingoetter, Cassandra L, PA-C 10 mL/hr at 09/04/23 1412 New Bag at 09/04/23 1412   iron sucrose (VENOFER) 300 mg in sodium chloride 0.9 % 250 mL IVPB  300 mg Intravenous Once Heilingoetter, Cassandra L, PA-C 176.7 mL/hr at 09/04/23 1446 300 mg at 09/04/23 1446    VITAL SIGNS: There were no vitals taken for this visit. There were no vitals filed for this visit.  Estimated body mass index is 19.41 kg/m as  calculated from the following:   Height as of 08/28/23: 6\' 1"  (1.854 m).   Weight as of 08/28/23: 147 lb 1.6 oz (66.7 kg).  LABS: CBC:    Component Value Date/Time   WBC 31.7 (H) 09/04/2023 1315   WBC 8.3 08/01/2023 1607   HGB 7.2 (L) 09/04/2023 1315   HCT 22.8 (L) 09/04/2023 1315   PLT 165 09/04/2023 1315   MCV 102.7 (H) 09/04/2023 1315   NEUTROABS 25.0 (H) 09/04/2023 1315   LYMPHSABS 2.2 09/04/2023 1315   MONOABS 2.5 (H) 09/04/2023 1315   EOSABS 0.1 09/04/2023 1315   BASOSABS 0.1 09/04/2023 1315   Comprehensive Metabolic Panel:    Component Value Date/Time   NA 140 09/04/2023 1315   K 4.1 09/04/2023 1315   CL 105 09/04/2023 1315   CO2 26 09/04/2023 1315   BUN 18 09/04/2023 1315  CREATININE 1.25 (H) 09/04/2023 1315   GLUCOSE 180 (H) 09/04/2023 1315   CALCIUM 10.2 09/04/2023 1315   AST 12 (L) 09/04/2023 1315   ALT 10 09/04/2023 1315   ALKPHOS 348 (H) 09/04/2023 1315   BILITOT 0.4 09/04/2023 1315   PROT 6.7 09/04/2023 1315   ALBUMIN 4.0 09/04/2023 1315    RADIOGRAPHIC STUDIES: XR FEMUR MIN 2 VIEWS LEFT Result Date: 08/11/2023 AP and lateral views of left femur reviewed.  No fracture or hardware complication noted.  Left intramedullary nail remains in good position and alignment without any complicating features.  No significant change compared with prior radiographs from time of surgery.   PERFORMANCE STATUS (ECOG) : 1 - Symptomatic but completely ambulatory   Physical Exam General: NAD Cardiovascular: regular rate and rhythm Pulmonary: normal breathing pattern  Extremities: no edema, no joint deformities Skin: no rashes Neurological: Alert and oriented x3  IMPRESSION:  I saw Jonathan Page during his iron infusion. No acute distress. Doing well overall. Since our last visit he underwent left hip surgery due to pathological fracture. He is slowly recovering. Able to ambulate with 4 prong cane. States he is working with PT however is considering transfering to [facility] as he lives closer and there is also water therapy. He plans to discuss further with his ortho team.   Denies concerns with nausea, vomiting, constipation, or diarrhea. Appetite is good. His current weight is 147lbs down from 154lbs on 1/29 however attributes this to surgery and recovery. He is taking things one day at a time.   We discussed his pain at length. Feels his pain is well controlled. He is taking gabapentin 300 mg every morning and has oxycodone 5 mg available as needed.  Patient states a family member brought him some topical cream which she has been using.  Since the use of this he has not required oxycodone.He is much appreciative of this. No adjustments to current regimen.   All questions answered and support provided.   I discussed the importance of continued conversation with family and their medical providers regarding overall plan of care and treatment options, ensuring decisions are within the context of the patients values and GOCs. Assessment & Plan Postoperative pain Has not required oxycodone due to effective pain relief from a cream. Is going to notify the name of cream. Continues gabapentin use. Cream reduces oxycodone need. No adjustments at this time.  - Continue using the cream for pain management as needed. - Oxycodone as needed.  Mobility issues Using a four-prong cane post-surgery. Considering water-based exercises at the [facility] for rehabilitation. -Considering transitioning from formal physical therapy to self-directed exercise at the [facility].  General Health Maintenance -Continue to monitor symptoms and report any changes. -Follow-up appointments as scheduled. -I will plan to see patient back in 3-4 weeks for follow-up.  Patient expressed understanding and was in agreement with this plan. He also understands that He can call the clinic at any time with any questions, concerns, or complaints.   Visit consisted of counseling and education dealing with the  complex and emotionally intense issues of symptom management and palliative care in the setting of serious and potentially life-threatening illness.  Signed by: Willette Alma, AGPCNP-BC Palliative Medicine Team/[facility]

## 2023-09-05 ENCOUNTER — Telehealth: Payer: Self-pay | Admitting: Nurse Practitioner

## 2023-09-05 NOTE — Telephone Encounter (Signed)
 Left detailed message of appt details. Informed patient to call back if appointments do not work.

## 2023-09-06 ENCOUNTER — Inpatient Hospital Stay

## 2023-09-06 ENCOUNTER — Ambulatory Visit: Payer: Medicare HMO

## 2023-09-06 ENCOUNTER — Other Ambulatory Visit (INDEPENDENT_AMBULATORY_CARE_PROVIDER_SITE_OTHER): Payer: Self-pay

## 2023-09-06 ENCOUNTER — Ambulatory Visit: Admitting: Surgical

## 2023-09-06 ENCOUNTER — Encounter: Payer: Self-pay | Admitting: Surgical

## 2023-09-06 ENCOUNTER — Other Ambulatory Visit: Payer: Self-pay

## 2023-09-06 DIAGNOSIS — D6481 Anemia due to antineoplastic chemotherapy: Secondary | ICD-10-CM

## 2023-09-06 DIAGNOSIS — Z5189 Encounter for other specified aftercare: Secondary | ICD-10-CM | POA: Diagnosis not present

## 2023-09-06 DIAGNOSIS — E119 Type 2 diabetes mellitus without complications: Secondary | ICD-10-CM | POA: Diagnosis not present

## 2023-09-06 DIAGNOSIS — Z5111 Encounter for antineoplastic chemotherapy: Secondary | ICD-10-CM | POA: Diagnosis not present

## 2023-09-06 DIAGNOSIS — S72002D Fracture of unspecified part of neck of left femur, subsequent encounter for closed fracture with routine healing: Secondary | ICD-10-CM

## 2023-09-06 DIAGNOSIS — I10 Essential (primary) hypertension: Secondary | ICD-10-CM | POA: Diagnosis not present

## 2023-09-06 DIAGNOSIS — C3412 Malignant neoplasm of upper lobe, left bronchus or lung: Secondary | ICD-10-CM | POA: Diagnosis not present

## 2023-09-06 DIAGNOSIS — R262 Difficulty in walking, not elsewhere classified: Secondary | ICD-10-CM | POA: Diagnosis not present

## 2023-09-06 DIAGNOSIS — R590 Localized enlarged lymph nodes: Secondary | ICD-10-CM | POA: Diagnosis not present

## 2023-09-06 DIAGNOSIS — C7951 Secondary malignant neoplasm of bone: Secondary | ICD-10-CM | POA: Diagnosis not present

## 2023-09-06 DIAGNOSIS — Z5112 Encounter for antineoplastic immunotherapy: Secondary | ICD-10-CM | POA: Diagnosis not present

## 2023-09-06 MED ORDER — HEPARIN SOD (PORK) LOCK FLUSH 100 UNIT/ML IV SOLN
500.0000 [IU] | Freq: Once | INTRAVENOUS | Status: AC
  Administered 2023-09-06: 500 [IU] via INTRAVENOUS

## 2023-09-06 MED ORDER — SODIUM CHLORIDE 0.9% IV SOLUTION
250.0000 mL | INTRAVENOUS | Status: DC
  Administered 2023-09-06: 100 mL via INTRAVENOUS

## 2023-09-06 MED ORDER — ACETAMINOPHEN 325 MG PO TABS
650.0000 mg | ORAL_TABLET | Freq: Once | ORAL | Status: AC
  Administered 2023-09-06: 650 mg via ORAL
  Filled 2023-09-06: qty 2

## 2023-09-06 MED ORDER — HEPARIN SOD (PORK) LOCK FLUSH 100 UNIT/ML IV SOLN: 250.0000 [IU] | INTRAVENOUS | Status: DC | PRN

## 2023-09-06 MED ORDER — SODIUM CHLORIDE 0.9% FLUSH
10.0000 mL | INTRAVENOUS | Status: AC | PRN
  Administered 2023-09-06: 10 mL

## 2023-09-06 MED ORDER — DIPHENHYDRAMINE HCL 25 MG PO CAPS
25.0000 mg | ORAL_CAPSULE | Freq: Once | ORAL | Status: AC
  Administered 2023-09-06: 25 mg via ORAL
  Filled 2023-09-06: qty 1

## 2023-09-06 NOTE — Patient Instructions (Signed)

## 2023-09-07 DIAGNOSIS — E119 Type 2 diabetes mellitus without complications: Secondary | ICD-10-CM | POA: Diagnosis not present

## 2023-09-07 DIAGNOSIS — M84552D Pathological fracture in neoplastic disease, left femur, subsequent encounter for fracture with routine healing: Secondary | ICD-10-CM | POA: Diagnosis not present

## 2023-09-07 DIAGNOSIS — C3490 Malignant neoplasm of unspecified part of unspecified bronchus or lung: Secondary | ICD-10-CM | POA: Diagnosis not present

## 2023-09-07 DIAGNOSIS — Z794 Long term (current) use of insulin: Secondary | ICD-10-CM | POA: Diagnosis not present

## 2023-09-07 DIAGNOSIS — C7951 Secondary malignant neoplasm of bone: Secondary | ICD-10-CM | POA: Diagnosis not present

## 2023-09-07 DIAGNOSIS — Z7982 Long term (current) use of aspirin: Secondary | ICD-10-CM | POA: Diagnosis not present

## 2023-09-07 DIAGNOSIS — F0781 Postconcussional syndrome: Secondary | ICD-10-CM | POA: Diagnosis not present

## 2023-09-07 DIAGNOSIS — Z87891 Personal history of nicotine dependence: Secondary | ICD-10-CM | POA: Diagnosis not present

## 2023-09-07 DIAGNOSIS — I10 Essential (primary) hypertension: Secondary | ICD-10-CM | POA: Diagnosis not present

## 2023-09-07 LAB — TYPE AND SCREEN
ABO/RH(D): O POS
Antibody Screen: NEGATIVE
Unit division: 0
Unit division: 0

## 2023-09-07 LAB — BPAM RBC
Blood Product Expiration Date: 202504212359
Blood Product Unit Number: 202504212359
ISSUE DATE / TIME: 202503260909
PRODUCT CODE: 202503260909
PRODUCT CODE: 202504212359
Unit Type and Rh: 5100
Unit Type and Rh: 202504212359
Unit Type and Rh: 5100
Unit Type and Rh: 5100

## 2023-09-08 ENCOUNTER — Encounter: Payer: Self-pay | Admitting: Surgical

## 2023-09-08 ENCOUNTER — Encounter: Payer: Medicare HMO | Admitting: Surgical

## 2023-09-08 MED ORDER — DICLOFENAC SODIUM 1 % EX GEL: 2.0000 g | Freq: Four times a day (QID) | CUTANEOUS | 0 refills | Status: DC

## 2023-09-08 MED ORDER — OXYCODONE HCL 5 MG PO TABS: 5.0000 mg | ORAL_TABLET | Freq: Three times a day (TID) | ORAL | 0 refills | Status: DC | PRN

## 2023-09-08 NOTE — Progress Notes (Signed)
 Post-Op Visit Note   Patient: Jonathan Page           Date of Birth: 01/01/1951           MRN: [id number] Visit Date: 09/06/2023 PCP: Jonathan Nest, NP   Assessment & Plan:  Chief Complaint:  Chief Complaint  Patient presents with   Left Hip - Follow-up   Visit Diagnoses:  1. Closed fracture of left hip requiring operative repair with routine healing, subsequent encounter     Plan: Patient is a 73 year old male who presents s/p left hip intramedullary nail for impending hip fracture on 07/25/2023.  Doing okay overall.  Feels about the same as he did at his last visit in regards to his pain and mobility.  Taking oxycodone and Tylenol.  Ambulating in a wheelchair outside of his house and using a cane around the house.  Still complains of groin pain with radiation down to the knee.  He feels overall fairly fatigued and is still undergoing chemotherapy treatments.  On exam, incisions are well-healed.  No calf tenderness.  Negative Homans' sign.  He does have reproducible hip pain with passive motion of the hip with internal rotation and hip flexion.  Has some weakness with hip flexion rated 4/5 which is actually comparable to his contralateral leg.  He is able to stand and pivot himself with minimal assistance from the wheelchair to the bed.  Radiographs demonstrate left femoral nail in appropriate position and alignment without any evidence of hardware failure.  Next step for [name] is we will continue with rehab exercises and trying to regain his strength.  After he finishes his chemotherapy treatment he will return in about a month and we can try intra-articular hip injection to see if this will help with some of his groin pain and radiating pain and help with mobility.  Does have some degenerative changes of his hip joint as noted on radiographs today.  Follow-up in 1 month.  Follow-Up Instructions: No follow-ups on file.   Orders:  Orders Placed This Encounter  Procedures   XR  FEMUR MIN 2 VIEWS LEFT   No orders of the defined types were placed in this encounter.   Imaging: No results found.  PMFS History: Patient Active Problem List   Diagnosis Date Noted   Iron deficiency anemia 08/08/2023   Anemia 08/01/2023   Thrombocytopenia (HCC) 08/01/2023   On antineoplastic chemotherapy 08/01/2023   Uncontrolled type 2 diabetes mellitus with hyperglycemia, without long-term current use of insulin (HCC) 08/01/2023   Chronic dyspnea 08/01/2023   Leg edema, right 08/01/2023   S/P total left hip intramedullary nail 07/25/23 08/01/2023   Status post hip surgery 07/25/2023   Port-A-Cath in place 07/17/2023   Encounter for antineoplastic chemotherapy 07/03/2023   Encounter for antineoplastic immunotherapy 07/03/2023   Non-small cell lung cancer metastatic to bone (HCC) 06/22/2023   Lung mass 06/02/2023   Diabetes mellitus treated with oral medication (HCC) 05/29/2023   Restless legs syndrome (RLS) 05/29/2023   Chest pain 01/20/2023   Left leg pain 10/28/2022   Skin lesion 04/16/2021   Preventative health care 11/08/2017   Syncope and collapse 10/14/2016   Prolonged QT interval 10/14/2016   Hyperlipidemia 01/25/2016   CKD (chronic kidney disease) stage 3, GFR 30-59 ml/min (HCC) 01/25/2016   Essential hypertension 10/27/2015   Type II diabetes mellitus with nephropathy (HCC) 10/22/2015   Past Medical History:  Diagnosis Date   Allergy    Cancer (HCC)    Cellulitis  02/21/2018   Concussion syndrome    Diabetes (HCC)    Frequent headaches    Hypertension     Family History  Problem Relation Age of Onset   Diabetes Father    Diabetes Paternal Grandfather    Diabetes Paternal Grandmother    Colon cancer Neg Hx    Crohn's disease Neg Hx    Rectal cancer Neg Hx    Stomach cancer Neg Hx    Esophageal cancer Neg Hx     Past Surgical History:  Procedure Laterality Date   APPENDECTOMY  1960   BRONCHIAL BIOPSY  06/12/2023   Procedure: BRONCHIAL BIOPSIES;   Surgeon: Josephine Igo, DO;  Location: [facility];  Service: Cardiopulmonary;;   BRONCHIAL BRUSHINGS  06/12/2023   Procedure: BRONCHIAL BRUSHINGS;  Surgeon: Josephine Igo, DO;  Location: [facility];  Service: Cardiopulmonary;;   COLONOSCOPY     FEMUR IM NAIL Left 07/25/2023   Procedure: LEFT HIP INTRAMEDULLARY HIP SCREW;  Surgeon: Cammy Copa, MD;  Location: [facility] OR;  Service: Orthopedics;  Laterality: Left;   HIP SURGERY Left 07/25/2023   metal pin placed in L hip area   IR IMAGING GUIDED PORT INSERTION  07/13/2023   VIDEO BRONCHOSCOPY Bilateral 06/12/2023   Procedure: VIDEO BRONCHOSCOPY;  Surgeon: Josephine Igo, DO;  Location: [facility];  Service: Cardiopulmonary;  Laterality: Bilateral;   WISDOM TOOTH EXTRACTION     Social History   Occupational History   Not on file  Tobacco Use   Smoking status: Former    Current packs/day: 0.00    Average packs/day: 0.5 packs/day for 30.0 years (15.0 ttl pk-yrs)    Types: Cigarettes, Cigars    Start date: 10/1988    Quit date: 10/2018    Years since quitting: 4.9   Smokeless tobacco: Never  Vaping Use   Vaping status: Never Used  Substance and Sexual Activity   Alcohol use: Yes    Alcohol/week: 6.0 standard drinks of alcohol    Types: 6 Cans of beer per week    Comment: occasional   Drug use: No   Sexual activity: Not on file

## 2023-09-11 ENCOUNTER — Telehealth: Payer: Self-pay

## 2023-09-11 ENCOUNTER — Ambulatory Visit (HOSPITAL_COMMUNITY)
Admission: RE | Admit: 2023-09-11 | Discharge: 2023-09-11 | Disposition: A | Discharge status: Home / Self Care | Source: Ambulatory Visit | Attending: Internal Medicine | Admitting: Internal Medicine

## 2023-09-11 ENCOUNTER — Other Ambulatory Visit: Payer: Self-pay

## 2023-09-11 ENCOUNTER — Inpatient Hospital Stay: Payer: Medicare HMO

## 2023-09-11 ENCOUNTER — Other Ambulatory Visit: Payer: Self-pay | Admitting: Internal Medicine

## 2023-09-11 DIAGNOSIS — E1121 Type 2 diabetes mellitus with diabetic nephropathy: Secondary | ICD-10-CM

## 2023-09-11 DIAGNOSIS — D509 Iron deficiency anemia, unspecified: Secondary | ICD-10-CM

## 2023-09-11 DIAGNOSIS — Z95828 Presence of other vascular implants and grafts: Secondary | ICD-10-CM

## 2023-09-11 DIAGNOSIS — C7951 Secondary malignant neoplasm of bone: Secondary | ICD-10-CM

## 2023-09-11 DIAGNOSIS — C779 Secondary and unspecified malignant neoplasm of lymph node, unspecified: Secondary | ICD-10-CM | POA: Diagnosis not present

## 2023-09-11 DIAGNOSIS — R262 Difficulty in walking, not elsewhere classified: Secondary | ICD-10-CM | POA: Diagnosis not present

## 2023-09-11 DIAGNOSIS — Z5111 Encounter for antineoplastic chemotherapy: Secondary | ICD-10-CM | POA: Diagnosis not present

## 2023-09-11 DIAGNOSIS — D649 Anemia, unspecified: Secondary | ICD-10-CM

## 2023-09-11 DIAGNOSIS — Z5112 Encounter for antineoplastic immunotherapy: Secondary | ICD-10-CM | POA: Diagnosis not present

## 2023-09-11 DIAGNOSIS — E119 Type 2 diabetes mellitus without complications: Secondary | ICD-10-CM | POA: Diagnosis not present

## 2023-09-11 DIAGNOSIS — Z5189 Encounter for other specified aftercare: Secondary | ICD-10-CM | POA: Diagnosis not present

## 2023-09-11 DIAGNOSIS — C3412 Malignant neoplasm of upper lobe, left bronchus or lung: Secondary | ICD-10-CM | POA: Diagnosis not present

## 2023-09-11 DIAGNOSIS — J439 Emphysema, unspecified: Secondary | ICD-10-CM | POA: Diagnosis not present

## 2023-09-11 DIAGNOSIS — R590 Localized enlarged lymph nodes: Secondary | ICD-10-CM | POA: Diagnosis not present

## 2023-09-11 DIAGNOSIS — E1165 Type 2 diabetes mellitus with hyperglycemia: Secondary | ICD-10-CM

## 2023-09-11 DIAGNOSIS — C3490 Malignant neoplasm of unspecified part of unspecified bronchus or lung: Secondary | ICD-10-CM

## 2023-09-11 DIAGNOSIS — I10 Essential (primary) hypertension: Secondary | ICD-10-CM | POA: Diagnosis not present

## 2023-09-11 LAB — CMP (CANCER CENTER ONLY)
ALT: 9 U/L (ref 0–44)
AST: 13 U/L — ABNORMAL LOW (ref 15–41)
Albumin: 3.9 g/dL (ref 3.5–5.0)
Alkaline Phosphatase: 332 U/L — ABNORMAL HIGH (ref 38–126)
Anion gap: 7 (ref 5–15)
BUN: 22 mg/dL (ref 8–23)
CO2: 26 mmol/L (ref 22–32)
Calcium: 9.9 mg/dL (ref 8.9–10.3)
Chloride: 107 mmol/L (ref 98–111)
Creatinine: 1.3 mg/dL — ABNORMAL HIGH (ref 0.61–1.24)
GFR, Estimated: 58 mL/min — ABNORMAL LOW (ref >60)
Glucose, Bld: 168 mg/dL — ABNORMAL HIGH (ref 70–99)
Potassium: 4 mmol/L (ref 3.5–5.1)
Sodium: 140 mmol/L (ref 135–145)
Total Bilirubin: 0.5 mg/dL (ref 0.0–1.2)
Total Protein: 6.6 g/dL (ref 6.5–8.1)

## 2023-09-11 LAB — SAMPLE TO BLOOD BANK

## 2023-09-11 LAB — CBC WITH DIFFERENTIAL (CANCER CENTER ONLY)
Abs Immature Granulocytes: 3.66 10*3/uL — ABNORMAL HIGH (ref 0.00–0.07)
Basophils Absolute: 0 10*3/uL (ref 0.0–0.1)
Basophils Relative: 0 %
Eosinophils Absolute: 0.1 10*3/uL (ref 0.0–0.5)
Eosinophils Relative: 0 %
HCT: 30.2 % — ABNORMAL LOW (ref 39.0–52.0)
Hemoglobin: 9.6 g/dL — ABNORMAL LOW (ref 13.0–17.0)
Immature Granulocytes: 10 %
Lymphocytes Relative: 7 %
Lymphs Abs: 2.8 10*3/uL (ref 0.7–4.0)
MCH: 32.5 pg (ref 26.0–34.0)
MCHC: 31.8 g/dL (ref 30.0–36.0)
MCV: 102.4 fL — ABNORMAL HIGH (ref 80.0–100.0)
Monocytes Absolute: 2.4 10*3/uL — ABNORMAL HIGH (ref 0.1–1.0)
Monocytes Relative: 6 %
Neutro Abs: 29.3 10*3/uL — ABNORMAL HIGH (ref 1.7–7.7)
Neutrophils Relative %: 77 %
Platelet Count: 156 10*3/uL (ref 150–400)
RBC: 2.95 MIL/uL — ABNORMAL LOW (ref 4.22–5.81)
RDW: 22 % — ABNORMAL HIGH (ref 11.5–15.5)
Smear Review: NORMAL
WBC Count: 38.2 10*3/uL — ABNORMAL HIGH (ref 4.0–10.5)
nRBC: 1.7 % — ABNORMAL HIGH (ref 0.0–0.2)

## 2023-09-11 MED ORDER — SODIUM CHLORIDE 0.9% FLUSH
10.0000 mL | INTRAVENOUS | Status: DC | PRN
Start: 2023-09-11 — End: 2023-09-11
  Administered 2023-09-11: 10 mL via INTRAVENOUS

## 2023-09-11 MED ORDER — HEPARIN SOD (PORK) LOCK FLUSH 100 UNIT/ML IV SOLN
250.0000 [IU] | Freq: Once | INTRAVENOUS | Status: AC | PRN
  Administered 2023-09-11: 250 [IU]

## 2023-09-11 MED ORDER — EMPAGLIFLOZIN 10 MG PO TABS: 10.0000 mg | ORAL_TABLET | Freq: Every day | ORAL | 0 refills | Status: DC

## 2023-09-11 NOTE — Telephone Encounter (Signed)
 Spoke to pt, scheduled ov for 09/20/23

## 2023-09-11 NOTE — Telephone Encounter (Signed)
 Last refill given by Dr. Alphonsus Sias. Per past notes patient due for follow up. Please advise

## 2023-09-11 NOTE — Telephone Encounter (Signed)
 Patient is overdue for diabetes follow up and will need to be seen for further refills of diabetes medication. Can we get him scheduled? Please let me know once he's scheduled.

## 2023-09-11 NOTE — Telephone Encounter (Signed)
 patient has labs only today and there is a blood bank hold request. Their hemoglobin is 9.6 and platelet is 156. Thanks!   Cassandra L Heilingoetter, PA-C Great. No need for blood. Thank you   Pt advised with VU.

## 2023-09-14 DIAGNOSIS — F0781 Postconcussional syndrome: Secondary | ICD-10-CM | POA: Diagnosis not present

## 2023-09-14 DIAGNOSIS — E119 Type 2 diabetes mellitus without complications: Secondary | ICD-10-CM | POA: Diagnosis not present

## 2023-09-14 DIAGNOSIS — C7951 Secondary malignant neoplasm of bone: Secondary | ICD-10-CM | POA: Diagnosis not present

## 2023-09-14 DIAGNOSIS — I10 Essential (primary) hypertension: Secondary | ICD-10-CM | POA: Diagnosis not present

## 2023-09-14 DIAGNOSIS — Z7982 Long term (current) use of aspirin: Secondary | ICD-10-CM | POA: Diagnosis not present

## 2023-09-14 DIAGNOSIS — C3490 Malignant neoplasm of unspecified part of unspecified bronchus or lung: Secondary | ICD-10-CM | POA: Diagnosis not present

## 2023-09-14 DIAGNOSIS — Z794 Long term (current) use of insulin: Secondary | ICD-10-CM | POA: Diagnosis not present

## 2023-09-14 DIAGNOSIS — M84552D Pathological fracture in neoplastic disease, left femur, subsequent encounter for fracture with routine healing: Secondary | ICD-10-CM | POA: Diagnosis not present

## 2023-09-14 DIAGNOSIS — Z87891 Personal history of nicotine dependence: Secondary | ICD-10-CM | POA: Diagnosis not present

## 2023-09-15 MED FILL — Fosaprepitant Dimeglumine For IV Infusion 150 MG (Base Eq): INTRAVENOUS | Qty: 5 | Status: CN

## 2023-09-15 MED FILL — Fosaprepitant Dimeglumine For IV Infusion 150 MG (Base Eq): INTRAVENOUS | Qty: 5 | Status: AC

## 2023-09-18 ENCOUNTER — Inpatient Hospital Stay: Payer: Medicare HMO

## 2023-09-18 ENCOUNTER — Encounter: Payer: Self-pay | Admitting: Internal Medicine

## 2023-09-18 ENCOUNTER — Other Ambulatory Visit: Payer: Self-pay | Admitting: Physician Assistant

## 2023-09-18 ENCOUNTER — Inpatient Hospital Stay: Payer: Medicare HMO | Attending: Physician Assistant

## 2023-09-18 ENCOUNTER — Inpatient Hospital Stay: Payer: Medicare HMO | Admitting: Internal Medicine

## 2023-09-18 VITALS — BP 96/69 | HR 110 | Temp 97.7°F | Resp 18 | Ht 73.0 in | Wt 143.4 lb

## 2023-09-18 DIAGNOSIS — Z5112 Encounter for antineoplastic immunotherapy: Secondary | ICD-10-CM | POA: Diagnosis not present

## 2023-09-18 DIAGNOSIS — Z9221 Personal history of antineoplastic chemotherapy: Secondary | ICD-10-CM | POA: Insufficient documentation

## 2023-09-18 DIAGNOSIS — I700 Atherosclerosis of aorta: Secondary | ICD-10-CM | POA: Insufficient documentation

## 2023-09-18 DIAGNOSIS — N289 Disorder of kidney and ureter, unspecified: Secondary | ICD-10-CM | POA: Diagnosis not present

## 2023-09-18 DIAGNOSIS — Z7982 Long term (current) use of aspirin: Secondary | ICD-10-CM | POA: Diagnosis not present

## 2023-09-18 DIAGNOSIS — Z79899 Other long term (current) drug therapy: Secondary | ICD-10-CM | POA: Diagnosis not present

## 2023-09-18 DIAGNOSIS — Z5111 Encounter for antineoplastic chemotherapy: Secondary | ICD-10-CM | POA: Insufficient documentation

## 2023-09-18 DIAGNOSIS — D649 Anemia, unspecified: Secondary | ICD-10-CM

## 2023-09-18 DIAGNOSIS — J439 Emphysema, unspecified: Secondary | ICD-10-CM | POA: Diagnosis not present

## 2023-09-18 DIAGNOSIS — D509 Iron deficiency anemia, unspecified: Secondary | ICD-10-CM

## 2023-09-18 DIAGNOSIS — C3490 Malignant neoplasm of unspecified part of unspecified bronchus or lung: Secondary | ICD-10-CM | POA: Diagnosis not present

## 2023-09-18 DIAGNOSIS — I10 Essential (primary) hypertension: Secondary | ICD-10-CM | POA: Diagnosis not present

## 2023-09-18 DIAGNOSIS — W19XXXA Unspecified fall, initial encounter: Secondary | ICD-10-CM | POA: Insufficient documentation

## 2023-09-18 DIAGNOSIS — I2510 Atherosclerotic heart disease of native coronary artery without angina pectoris: Secondary | ICD-10-CM | POA: Insufficient documentation

## 2023-09-18 DIAGNOSIS — Z9049 Acquired absence of other specified parts of digestive tract: Secondary | ICD-10-CM | POA: Diagnosis not present

## 2023-09-18 DIAGNOSIS — C778 Secondary and unspecified malignant neoplasm of lymph nodes of multiple regions: Secondary | ICD-10-CM | POA: Diagnosis not present

## 2023-09-18 DIAGNOSIS — C7951 Secondary malignant neoplasm of bone: Secondary | ICD-10-CM | POA: Diagnosis not present

## 2023-09-18 DIAGNOSIS — T465X6A Underdosing of other antihypertensive drugs, initial encounter: Secondary | ICD-10-CM | POA: Insufficient documentation

## 2023-09-18 DIAGNOSIS — J90 Pleural effusion, not elsewhere classified: Secondary | ICD-10-CM | POA: Insufficient documentation

## 2023-09-18 DIAGNOSIS — C3412 Malignant neoplasm of upper lobe, left bronchus or lung: Secondary | ICD-10-CM | POA: Insufficient documentation

## 2023-09-18 DIAGNOSIS — Z95828 Presence of other vascular implants and grafts: Secondary | ICD-10-CM

## 2023-09-18 DIAGNOSIS — F17210 Nicotine dependence, cigarettes, uncomplicated: Secondary | ICD-10-CM | POA: Diagnosis not present

## 2023-09-18 DIAGNOSIS — Z923 Personal history of irradiation: Secondary | ICD-10-CM | POA: Insufficient documentation

## 2023-09-18 DIAGNOSIS — E119 Type 2 diabetes mellitus without complications: Secondary | ICD-10-CM | POA: Diagnosis not present

## 2023-09-18 DIAGNOSIS — Z79633 Long term (current) use of mitotic inhibitor: Secondary | ICD-10-CM | POA: Diagnosis not present

## 2023-09-18 LAB — CMP (CANCER CENTER ONLY)
ALT: 11 U/L (ref 0–44)
AST: 13 U/L — ABNORMAL LOW (ref 15–41)
Albumin: 4.2 g/dL (ref 3.5–5.0)
Alkaline Phosphatase: 347 U/L — ABNORMAL HIGH (ref 38–126)
Anion gap: 8 (ref 5–15)
BUN: 28 mg/dL — ABNORMAL HIGH (ref 8–23)
CO2: 25 mmol/L (ref 22–32)
Calcium: 10.4 mg/dL — ABNORMAL HIGH (ref 8.9–10.3)
Chloride: 106 mmol/L (ref 98–111)
Creatinine: 1.46 mg/dL — ABNORMAL HIGH (ref 0.61–1.24)
GFR, Estimated: 51 mL/min — ABNORMAL LOW (ref >60)
Glucose, Bld: 198 mg/dL — ABNORMAL HIGH (ref 70–99)
Potassium: 4 mmol/L (ref 3.5–5.1)
Sodium: 139 mmol/L (ref 135–145)
Total Bilirubin: 0.5 mg/dL (ref 0.0–1.2)
Total Protein: 7.2 g/dL (ref 6.5–8.1)

## 2023-09-18 LAB — CBC WITH DIFFERENTIAL (CANCER CENTER ONLY)
Abs Immature Granulocytes: 1.34 10*3/uL — ABNORMAL HIGH (ref 0.00–0.07)
Basophils Absolute: 0.1 10*3/uL (ref 0.0–0.1)
Basophils Relative: 0 %
Eosinophils Absolute: 0 10*3/uL (ref 0.0–0.5)
Eosinophils Relative: 0 %
HCT: 29 % — ABNORMAL LOW (ref 39.0–52.0)
Hemoglobin: 9.2 g/dL — ABNORMAL LOW (ref 13.0–17.0)
Immature Granulocytes: 4 %
Lymphocytes Relative: 6 %
Lymphs Abs: 1.9 10*3/uL (ref 0.7–4.0)
MCH: 33 pg (ref 26.0–34.0)
MCHC: 31.7 g/dL (ref 30.0–36.0)
MCV: 103.9 fL — ABNORMAL HIGH (ref 80.0–100.0)
Monocytes Absolute: 1.5 10*3/uL — ABNORMAL HIGH (ref 0.1–1.0)
Monocytes Relative: 5 %
Neutro Abs: 26.6 10*3/uL — ABNORMAL HIGH (ref 1.7–7.7)
Neutrophils Relative %: 85 %
Platelet Count: 146 10*3/uL — ABNORMAL LOW (ref 150–400)
RBC: 2.79 MIL/uL — ABNORMAL LOW (ref 4.22–5.81)
RDW: 22.3 % — ABNORMAL HIGH (ref 11.5–15.5)
WBC Count: 31.5 10*3/uL — ABNORMAL HIGH (ref 4.0–10.5)
nRBC: 0.5 % — ABNORMAL HIGH (ref 0.0–0.2)

## 2023-09-18 LAB — SAMPLE TO BLOOD BANK

## 2023-09-18 MED ORDER — SODIUM CHLORIDE 0.9% FLUSH
10.0000 mL | INTRAVENOUS | Status: DC | PRN
  Administered 2023-09-18: 10 mL via INTRAVENOUS

## 2023-09-18 MED ORDER — OXYCODONE HCL 5 MG PO TABS: 5.0000 mg | ORAL_TABLET | Freq: Three times a day (TID) | ORAL | 0 refills | Status: DC | PRN

## 2023-09-18 MED ORDER — DEXAMETHASONE 4 MG PO TABS: ORAL_TABLET | ORAL | 1 refills | Status: DC

## 2023-09-18 NOTE — Patient Instructions (Signed)

## 2023-09-18 NOTE — Progress Notes (Signed)
 DISCONTINUE OFF PATHWAY REGIMEN - Non-Small Cell Lung   [id number]:Cemiplimab 350 mg IV D1 + Carboplatin AUC=6 IV D1 + Paclitaxel 200 mg/m2 IV D1 q21 Days x 4 Cycles:   A cycle is every 21 days:     Paclitaxel      Carboplatin      Cemiplimab-rwlc   **Always confirm dose/schedule in your pharmacy ordering system**  PRIOR TREATMENT: Off Pathway: Cemiplimab 350 mg IV D1 + Carboplatin AUC=6 IV D1 + Paclitaxel 200 mg/m2 IV D1 q21 Days x 4 Cycles  START ON PATHWAY REGIMEN - Non-Small Cell Lung     A cycle is every 21 days:     Ramucirumab      Docetaxel   **Always confirm dose/schedule in your pharmacy ordering system**  Patient Characteristics: Stage IV Metastatic, Nonsquamous, Molecular Analysis Completed, Molecular Alteration Present and Targeted Therapy Exhausted OR KRAS G12C+ or HER2+ or NRG1+ Present and No Prior Chemo/Immunotherapy OR No Alteration Present, Second Line -  Chemotherapy/Immunotherapy, PS = 0, 1, No Prior PD-1/PD-L1  Inhibitor or Prior PD-1/PD-L1 Inhibitor + Chemotherapy, and Not a Candidate for Immunotherapy Therapeutic Status: Stage IV Metastatic Histology: Nonsquamous Cell Broad Molecular Profiling Status: Animal nutritionist Analysis Results: No Alteration Present ECOG Performance Status: 1 Chemotherapy/Immunotherapy Line of Therapy: Second Line Chemotherapy/Immunotherapy Immunotherapy Candidate Status: Not a Candidate for Immunotherapy Prior Immunotherapy Status: Prior PD-1/PD-L1 Inhibitor + Chemotherapy Intent of Therapy: Non-Curative / Palliative Intent, Discussed with Patient

## 2023-09-18 NOTE — Progress Notes (Signed)
 [facility] Telephone:([phone]) [phone]   Fax:(336) [phone]  OFFICE PROGRESS NOTE  Doreene Nest, NP [address] [place] [address]  DIAGNOSIS: Stage IV (T3, N2, M1 C) non-small cell lung cancer, adenocarcinoma presented with large left upper lobe lung mass with left hilar and mediastinal invasion in addition to thoracic inlet lymphadenopathy as well as several metastatic bone lesions involving the spine, ribs as well as the right iliac bone diagnosed in December 2024.   Molecular studies by [id number] showed no actionable mutations and PD-L1 expression was negative.   PRIOR THERAPY: Palliative systemic chemotherapy and immunotherapy with carboplatin for an AUC of 5, Paclitaxol 175 mg/m, and Libtayo IV every 3 weeks with Neulasta support.  (Not a candidate for Alimta due to CKD).  First dose on 07/03/23.  Status post 3 cycles.  Last dose was given on August 28, 2023.  This treatment was discontinued secondary to disease progression.   CURRENT THERAPY: 1) second line systemic chemotherapy with docetaxel 75 Mg/M2 and ramucirumab 10 Mg/KG every 3 weeks with Neulasta support.  First dose September 25, 2023. 2) radiation to L4 and L5 as well as the left hip under the care of Dr. Kathrynn Running.   INTERVAL HISTORY: Jonathan Page 73 y.o. male returns to the clinic today for follow-up visit accompanied by his wife.   MEDICAL HISTORY: Past Medical History:  Diagnosis Date   Allergy    Cancer (HCC)    Cellulitis 02/21/2018   Concussion syndrome    Diabetes (HCC)    Frequent headaches    Hypertension     ALLERGIES:  has no known allergies.  MEDICATIONS:  Current Outpatient Medications  Medication Sig Dispense Refill   aspirin EC 81 MG tablet Take 81 mg by mouth daily.     Blood Glucose Monitoring Suppl (ACCU-CHEK AVIVA PLUS) w/Device KIT Check blood sugar before breakfast, lunch and bedtime and as directed. Dx E11.65 1 kit 0   Blood Glucose Monitoring Suppl DEVI 1  each by Does not apply route as directed. May substitute to any manufacturer covered by patient's insurance. 1 each 0   diclofenac Sodium (VOLTAREN) 1 % GEL Apply 2 g topically 4 (four) times daily. 100 g 0   docusate sodium (COLACE) 100 MG capsule Take 1 capsule (100 mg total) by mouth 2 (two) times daily. 10 capsule 0   empagliflozin (JARDIANCE) 10 MG TABS tablet Take 1 tablet (10 mg total) by mouth daily before breakfast. for diabetes. 30 tablet 0   folic acid (FOLVITE) 1 MG tablet Take 1 mg by mouth in the morning.     gabapentin (NEURONTIN) 300 MG capsule Take 1 capsule (300 mg total) by mouth at bedtime. (Patient taking differently: Take 300 mg by mouth in the morning.) 30 capsule 1   glipiZIDE (GLUCOTROL XL) 10 MG 24 hr tablet Take 1 tablet (10 mg total) by mouth daily with breakfast. for diabetes. 90 tablet 3   glucose blood (ACCU-CHEK AVIVA PLUS) test strip Check blood sugar before breakfast, lunch and bedtime and as directed. Dx E11.65 300 each 1   insulin NPH-regular Human (70-30) 100 UNIT/ML injection Inject 5 Units into the skin daily with breakfast.     Lancets (ACCU-CHEK SOFT TOUCH) lancets Check blood sugar before breakfast, lunch and bedtime and as directed. Dx E11.65 300 each 1   lidocaine-prilocaine (EMLA) cream Apply 1 Application topically as needed. 30 g 1   lisinopril (ZESTRIL) 10 MG tablet Take 1  tablet (10 mg total) by mouth daily. for blood pressure. 90 tablet 0   methocarbamol (ROBAXIN) 500 MG tablet Take 1 tablet (500 mg total) by mouth every 8 (eight) hours as needed for muscle spasms. 30 tablet 0   ondansetron (ZOFRAN) 8 MG tablet Take 8 mg by mouth every 8 (eight) hours as needed for nausea or vomiting.     oxyCODONE (OXY IR/ROXICODONE) 5 MG immediate release tablet Take 1 tablet (5 mg total) by mouth every 8 (eight) hours as needed for moderate pain (pain score 4-6). 30 tablet 0   prochlorperazine (COMPAZINE) 10 MG tablet Take 1 tablet (10 mg total) by mouth every 6  (six) hours as needed for nausea or vomiting. 30 tablet 0   No current facility-administered medications for this visit.    SURGICAL HISTORY:  Past Surgical History:  Procedure Laterality Date   APPENDECTOMY  1960   BRONCHIAL BIOPSY  06/12/2023   Procedure: BRONCHIAL BIOPSIES;  Surgeon: Josephine Igo, DO;  Location: [facility];  Service: Cardiopulmonary;;   BRONCHIAL BRUSHINGS  06/12/2023   Procedure: BRONCHIAL BRUSHINGS;  Surgeon: Josephine Igo, DO;  Location: [facility];  Service: Cardiopulmonary;;   COLONOSCOPY     FEMUR IM NAIL Left 07/25/2023   Procedure: LEFT HIP INTRAMEDULLARY HIP SCREW;  Surgeon: Cammy Copa, MD;  Location: [facility] OR;  Service: Orthopedics;  Laterality: Left;   HIP SURGERY Left 07/25/2023   metal pin placed in L hip area   IR IMAGING GUIDED PORT INSERTION  07/13/2023   VIDEO BRONCHOSCOPY Bilateral 06/12/2023   Procedure: VIDEO BRONCHOSCOPY;  Surgeon: Josephine Igo, DO;  Location: [facility];  Service: Cardiopulmonary;  Laterality: Bilateral;   WISDOM TOOTH EXTRACTION      REVIEW OF SYSTEMS:  Constitutional: positive for fatigue Eyes: negative Ears, nose, mouth, throat, and face: negative Respiratory: negative Cardiovascular: negative Gastrointestinal: negative Genitourinary:negative Integument/breast: negative Hematologic/lymphatic: negative Musculoskeletal:positive for bone pain Neurological: negative Behavioral/Psych: negative Endocrine: negative Allergic/Immunologic: negative   PHYSICAL EXAMINATION: General appearance: alert, cooperative, fatigued, and no distress Head: Normocephalic, without obvious abnormality, atraumatic Neck: no adenopathy, no JVD, supple, symmetrical, trachea midline, and thyroid not enlarged, symmetric, no tenderness/mass/nodules Lymph nodes: Cervical, supraclavicular, and axillary nodes normal. Resp: normal percussion bilaterally Back: symmetric, no curvature. ROM normal. No CVA tenderness. Cardio:  regular rate and rhythm, S1, S2 normal, no murmur, click, rub or gallop GI: soft, non-tender; bowel sounds normal; no masses,  no organomegaly Extremities: extremities normal, atraumatic, no cyanosis or edema Neurologic: Alert and oriented X 3, normal strength and tone. Normal symmetric reflexes. Normal coordination and gait  ECOG PERFORMANCE STATUS: 1 - Symptomatic but completely ambulatory  Blood pressure 96/69, pulse (!) 110, temperature 97.7 F (36.5 C), temperature source Temporal, resp. rate 18, height 6\' 1"  (1.854 m), weight 143 lb 6.4 oz (65 kg), SpO2 100%.  LABORATORY DATA: Lab Results  Component Value Date   WBC 38.2 (H) 09/11/2023   HGB 9.6 (L) 09/11/2023   HCT 30.2 (L) 09/11/2023   MCV 102.4 (H) 09/11/2023   PLT 156 09/11/2023      Chemistry      Component Value Date/Time   NA 140 09/11/2023 1446   K 4.0 09/11/2023 1446   CL 107 09/11/2023 1446   CO2 26 09/11/2023 1446   BUN 22 09/11/2023 1446   CREATININE 1.30 (H) 09/11/2023 1446      Component Value Date/Time   CALCIUM 9.9 09/11/2023 1446   ALKPHOS 332 (H) 09/11/2023 1446   AST 13 (  L) 09/11/2023 1446   ALT 9 09/11/2023 1446   BILITOT 0.5 09/11/2023 1446       RADIOGRAPHIC STUDIES: CT CHEST ABDOMEN PELVIS WO CONTRAST Result Date: 09/15/2023 CLINICAL DATA:  Non-small-cell lung cancer. Status post chemotherapy. * Tracking Code: BO * EXAM: CT CHEST, ABDOMEN AND PELVIS WITHOUT CONTRAST TECHNIQUE: Multidetector CT imaging of the chest, abdomen and pelvis was performed following the standard protocol without IV contrast. RADIATION DOSE REDUCTION: This exam was performed according to the departmental dose-optimization program which includes automated exposure control, adjustment of the mA and/or kV according to patient size and/or use of iterative reconstruction technique. COMPARISON:  05/29/2023 CTs.  PET of 07/10/2023 FINDINGS: CT CHEST FINDINGS Cardiovascular: Right Port-A-Cath tip high right atrium. Aortic  atherosclerosis. Tortuous thoracic aorta. Normal heart size, without pericardial effusion. Lad coronary artery calcification. Pulmonary artery enlargement, outflow tract 3.5 cm. Mediastinum/Nodes: Left supraclavicular adenopathy at 1.0 cm on 09/02 versus 9 mm on the prior. Infiltrative tumor and/or adenopathy extending to the left side of the mediastinum, including on 28/2, relatively similar. No right-sided mediastinal adenopathy. Hilar regions poorly evaluated without intravenous contrast. Lungs/Pleura: Moderate left pleural effusion is new since the prior diagnostic CT with mild loculation superiorly. A small right pleural effusion is also new. Left upper lobe endobronchial obstruction again identified. Central left upper lobe lung mass measures 4.3 x 3.6 cm on 64/4 versus 4.4 x 3.2 cm when remeasured in a similar fashion on the prior, suggesting relative stability. Progressive tumor extension/endobronchial spread throughout the left upper lobe, with confluent soft tissue thickening along the peribronchovascular bundle on 74/4 including at up to 1.9 cm thickness today versus 8 mm on the prior CT (when remeasured). Innumerable bilateral pulmonary nodules are progressive. Example within the subpleural right upper lobe at 9 8 mm on 50/4, 1-2 mm on the prior. Musculoskeletal: Included within the abdomen pelvic section. CT ABDOMEN PELVIS FINDINGS Hepatobiliary: Tiny hepatic cysts. Normal gallbladder, without biliary ductal dilatation. Pancreas: Normal, without mass or ductal dilatation. Spleen: Normal in size, without focal abnormality. Adrenals/Urinary Tract: Bilateral adrenal thickening again identified. Hyperattenuating material in both collecting systems, increased since 07/10/2023 PET. Multiple bilateral low-density renal lesions are likely cysts. No hydronephrosis. Degraded evaluation of the pelvis, secondary to beam hardening artifact from left proximal femur fixation. No gross bladder abnormality.  Stomach/Bowel: Normal stomach, without wall thickening. Colonic stool burden suggests constipation. Normal terminal ileum. Normal small bowel. Vascular/Lymphatic: Aortic atherosclerosis. No abdominopelvic adenopathy. Reproductive: Normal prostate. Other: No significant free fluid. No free intraperitoneal air. No evidence of omental or peritoneal disease. Musculoskeletal: Soft tissue nodule superficial to the xiphoid process measures 2.6 cm on 44/2 versus 1.6 cm on the prior. Interval left proximal femur fixation. Permeative destruction of the left first rib is again identified anteriorly, progressive posteriorly. Posterior right tenth rib metastasis with possible pathologic fracture including on 64/2, new. Increased lytic lesions throughout the majority of the upper and midthoracic spine. IMPRESSION: 1. Disease progression, as evidenced by increased peribronchovascular tumor spread within the left upper lobe, as well as diffuse progressive pulmonary metastasis. The central left upper lobe primary and direct mediastinal invasion/nodal metastasis are not significantly changed. 2. Progressive diffuse osseous metastasis. Enlarged soft tissue mass superficial the xiphoid process is likely a subcutaneous metastasis. 3. Moderate loculated left pleural effusion. New small right pleural effusion. 4. Increased hyperattenuating material throughout both renal collecting systems. Although a component of this could represent retained contrast, primarily felt to represent progressive calculi. 5. Incidental findings, including: Aortic atherosclerosis (ICD10-I70.0), coronary  artery atherosclerosis and emphysema (ICD10-J43.9). Pulmonary artery enlargement suggests pulmonary arterial hypertension. Electronically Signed   By: Jeronimo Greaves M.D.   On: 09/15/2023 13:59   XR FEMUR MIN 2 VIEWS LEFT Result Date: 09/08/2023 AP and lateral view of left femur reviewed.  Intramedullary nail in good position and alignment without any  complication.  No evidence of hardware failure.  No fracture.  No dislocation.  Mild degenerative changes of the left hip joint   ASSESSMENT AND PLAN: This is a very pleasant 73 years old African-American male with Stage IV (T3, N2, M1 C) non-small cell lung cancer, adenocarcinoma presented with large left upper lobe lung mass with left hilar and mediastinal invasion in addition to thoracic inlet lymphadenopathy as well as several metastatic bone lesions involving the spine, ribs as well as the right iliac bone diagnosed in December 2024. He completed a course of palliative radiotherapy to the L4-5 under the care of Dr. Kathrynn Running. The patient is currently undergoing systemic chemotherapy with carboplatin for AUC of 5, paclitaxel 175 Mg/M2 and Keytruda 200 Mg IV every 3 weeks status post 3 cycles.  He has not a candidate for treatment with pemetrexed because of his renal insufficiency.  He has been tolerating this treatment fairly well.  The patient voices understanding of current disease status and treatment options and is in agreement with the current care plan.  All questions were answered. The patient knows to call the clinic with any problems, questions or concerns. We can certainly see the patient much sooner if necessary.  The total time spent in the appointment was 30 minutes.  Disclaimer: This note was dictated with voice recognition software. Similar sounding words can inadvertently be transcribed and may not be corrected upon review.

## 2023-09-19 ENCOUNTER — Telehealth: Payer: Self-pay

## 2023-09-19 ENCOUNTER — Inpatient Hospital Stay

## 2023-09-19 NOTE — Telephone Encounter (Signed)
 Spoke with patients wife and she stated the patients port is still accessed.  Informed wife to come to the cancer center and patient will be deaccessed. Patient arrived at cancer center and no acute distress. Needle removed and gauze placed.

## 2023-09-20 ENCOUNTER — Ambulatory Visit

## 2023-09-20 ENCOUNTER — Ambulatory Visit: Admitting: Primary Care

## 2023-09-20 ENCOUNTER — Ambulatory Visit: Payer: Medicare HMO

## 2023-09-21 NOTE — Progress Notes (Signed)
 Pharmacist Chemotherapy Monitoring - Initial Assessment    Anticipated start date: 09/28/23   The following has been reviewed per standard work regarding the patient's treatment regimen: The patient's diagnosis, treatment plan and drug doses, and organ/hematologic function Lab orders and baseline tests specific to treatment regimen  The treatment plan start date, drug sequencing, and pre-medications Prior authorization status  Patient's documented medication list, including drug-drug interaction screen and prescriptions for anti-emetics and supportive care specific to the treatment regimen The drug concentrations, fluid compatibility, administration routes, and timing of the medications to be used The patient's access for treatment and lifetime cumulative dose history, if applicable  The patient's medication allergies and previous infusion related reactions, if applicable   Changes made to treatment plan:  N/A  Follow up needed:  Pending authorization for treatment    Drusilla Kanner, PharmD, MBA

## 2023-09-25 ENCOUNTER — Inpatient Hospital Stay: Payer: Medicare HMO

## 2023-09-27 ENCOUNTER — Telehealth: Payer: Self-pay | Admitting: Medical Oncology

## 2023-09-27 ENCOUNTER — Other Ambulatory Visit: Payer: Self-pay | Admitting: Internal Medicine

## 2023-09-27 MED ORDER — OXYCODONE HCL 5 MG PO TABS: 5.0000 mg | ORAL_TABLET | Freq: Three times a day (TID) | ORAL | 0 refills | Status: DC | PRN

## 2023-09-27 NOTE — Telephone Encounter (Signed)
Requested refill for oxycodone. 

## 2023-09-28 ENCOUNTER — Inpatient Hospital Stay

## 2023-09-28 VITALS — BP 105/73 | HR 91 | Temp 97.7°F | Resp 18 | Wt 140.0 lb

## 2023-09-28 DIAGNOSIS — Z95828 Presence of other vascular implants and grafts: Secondary | ICD-10-CM

## 2023-09-28 DIAGNOSIS — I10 Essential (primary) hypertension: Secondary | ICD-10-CM | POA: Diagnosis not present

## 2023-09-28 DIAGNOSIS — C3490 Malignant neoplasm of unspecified part of unspecified bronchus or lung: Secondary | ICD-10-CM

## 2023-09-28 DIAGNOSIS — I2510 Atherosclerotic heart disease of native coronary artery without angina pectoris: Secondary | ICD-10-CM | POA: Diagnosis not present

## 2023-09-28 DIAGNOSIS — C3412 Malignant neoplasm of upper lobe, left bronchus or lung: Secondary | ICD-10-CM | POA: Diagnosis not present

## 2023-09-28 DIAGNOSIS — Z5111 Encounter for antineoplastic chemotherapy: Secondary | ICD-10-CM | POA: Diagnosis not present

## 2023-09-28 DIAGNOSIS — C7951 Secondary malignant neoplasm of bone: Secondary | ICD-10-CM | POA: Diagnosis not present

## 2023-09-28 DIAGNOSIS — D509 Iron deficiency anemia, unspecified: Secondary | ICD-10-CM

## 2023-09-28 DIAGNOSIS — Z5112 Encounter for antineoplastic immunotherapy: Secondary | ICD-10-CM | POA: Diagnosis not present

## 2023-09-28 DIAGNOSIS — D649 Anemia, unspecified: Secondary | ICD-10-CM

## 2023-09-28 DIAGNOSIS — C778 Secondary and unspecified malignant neoplasm of lymph nodes of multiple regions: Secondary | ICD-10-CM | POA: Diagnosis not present

## 2023-09-28 DIAGNOSIS — J439 Emphysema, unspecified: Secondary | ICD-10-CM | POA: Diagnosis not present

## 2023-09-28 DIAGNOSIS — T451X5A Adverse effect of antineoplastic and immunosuppressive drugs, initial encounter: Secondary | ICD-10-CM

## 2023-09-28 DIAGNOSIS — I700 Atherosclerosis of aorta: Secondary | ICD-10-CM | POA: Diagnosis not present

## 2023-09-28 LAB — CMP (CANCER CENTER ONLY)
ALT: 7 U/L (ref 0–44)
AST: 10 U/L — ABNORMAL LOW (ref 15–41)
Albumin: 4.1 g/dL (ref 3.5–5.0)
Alkaline Phosphatase: 326 U/L — ABNORMAL HIGH (ref 38–126)
Anion gap: 11 (ref 5–15)
BUN: 25 mg/dL — ABNORMAL HIGH (ref 8–23)
CO2: 24 mmol/L (ref 22–32)
Calcium: 10.3 mg/dL (ref 8.9–10.3)
Chloride: 102 mmol/L (ref 98–111)
Creatinine: 1.46 mg/dL — ABNORMAL HIGH (ref 0.61–1.24)
GFR, Estimated: 51 mL/min — ABNORMAL LOW (ref >60)
Glucose, Bld: 431 mg/dL — ABNORMAL HIGH (ref 70–99)
Potassium: 3.9 mmol/L (ref 3.5–5.1)
Sodium: 137 mmol/L (ref 135–145)
Total Bilirubin: 0.6 mg/dL (ref 0.0–1.2)
Total Protein: 7.1 g/dL (ref 6.5–8.1)

## 2023-09-28 LAB — CBC WITH DIFFERENTIAL (CANCER CENTER ONLY)
Abs Immature Granulocytes: 0.11 10*3/uL — ABNORMAL HIGH (ref 0.00–0.07)
Basophils Absolute: 0 10*3/uL (ref 0.0–0.1)
Basophils Relative: 0 %
Eosinophils Absolute: 0 10*3/uL (ref 0.0–0.5)
Eosinophils Relative: 0 %
HCT: 27.8 % — ABNORMAL LOW (ref 39.0–52.0)
Hemoglobin: 8.9 g/dL — ABNORMAL LOW (ref 13.0–17.0)
Immature Granulocytes: 1 %
Lymphocytes Relative: 7 %
Lymphs Abs: 1.1 10*3/uL (ref 0.7–4.0)
MCH: 33.8 pg (ref 26.0–34.0)
MCHC: 32 g/dL (ref 30.0–36.0)
MCV: 105.7 fL — ABNORMAL HIGH (ref 80.0–100.0)
Monocytes Absolute: 0.9 10*3/uL (ref 0.1–1.0)
Monocytes Relative: 6 %
Neutro Abs: 13.5 10*3/uL — ABNORMAL HIGH (ref 1.7–7.7)
Neutrophils Relative %: 86 %
Platelet Count: 168 10*3/uL (ref 150–400)
RBC: 2.63 MIL/uL — ABNORMAL LOW (ref 4.22–5.81)
RDW: 21.6 % — ABNORMAL HIGH (ref 11.5–15.5)
WBC Count: 15.6 10*3/uL — ABNORMAL HIGH (ref 4.0–10.5)
nRBC: 0.1 % (ref 0.0–0.2)

## 2023-09-28 LAB — SAMPLE TO BLOOD BANK

## 2023-09-28 LAB — TSH: TSH: 3.259 u[IU]/mL (ref 0.350–4.500)

## 2023-09-28 MED ORDER — SODIUM CHLORIDE 0.9 % IV SOLN
10.0000 mg/kg | Freq: Once | INTRAVENOUS | Status: AC
  Administered 2023-09-28: 700 mg via INTRAVENOUS
  Filled 2023-09-28: qty 20

## 2023-09-28 MED ORDER — DIPHENHYDRAMINE HCL 50 MG/ML IJ SOLN
50.0000 mg | Freq: Once | INTRAMUSCULAR | Status: AC
  Administered 2023-09-28: 50 mg via INTRAVENOUS
  Filled 2023-09-28: qty 1

## 2023-09-28 MED ORDER — SODIUM CHLORIDE 0.9 % IV SOLN: INTRAVENOUS | Status: DC

## 2023-09-28 MED ORDER — SODIUM CHLORIDE 0.9 % IV SOLN
75.0000 mg/m2 | Freq: Once | INTRAVENOUS | Status: AC
  Administered 2023-09-28: 137 mg via INTRAVENOUS
  Filled 2023-09-28: qty 13.46

## 2023-09-28 MED ORDER — SODIUM CHLORIDE 0.9% FLUSH
10.0000 mL | INTRAVENOUS | Status: DC | PRN
  Administered 2023-09-28: 10 mL

## 2023-09-28 MED ORDER — DEXAMETHASONE SODIUM PHOSPHATE 10 MG/ML IJ SOLN
10.0000 mg | Freq: Once | INTRAMUSCULAR | Status: AC
  Administered 2023-09-28: 10 mg via INTRAVENOUS
  Filled 2023-09-28: qty 1

## 2023-09-28 MED ORDER — HEPARIN SOD (PORK) LOCK FLUSH 100 UNIT/ML IV SOLN
500.0000 [IU] | Freq: Once | INTRAVENOUS | Status: AC | PRN
  Administered 2023-09-28: 500 [IU]

## 2023-09-28 MED ORDER — ACETAMINOPHEN 325 MG PO TABS
650.0000 mg | ORAL_TABLET | Freq: Once | ORAL | Status: AC
  Administered 2023-09-28: 650 mg via ORAL
  Filled 2023-09-28: qty 2

## 2023-09-28 MED ORDER — SODIUM CHLORIDE 0.9% FLUSH
10.0000 mL | INTRAVENOUS | Status: DC | PRN
Start: 2023-09-28 — End: 2023-09-28
  Administered 2023-09-28: 10 mL via INTRAVENOUS

## 2023-09-28 NOTE — Progress Notes (Signed)
 Patient presents to infusion for D1C1 Cyramza, Taxotere. Patient was given urine specimen container prior to coming to infusion, patient unable to provide sample. This RN offered patient beverages, patient consumed 3 cups water, still unable to provide sample. Per Dr Marguerita Shih, okay to proceed with pre-medications while awaiting sample.  Pre-medications administered & post 30 minute wait - pt still unable to provide sample. Per Dr Marguerita Shih, okay to proceed with treatment without urine protein today.

## 2023-09-28 NOTE — Patient Instructions (Signed)
 [facility] - A DEPT OF [facility]  Discharge Instructions: Thank you for choosing [facility] to provide your oncology and hematology care.   If you have a lab appointment with the Cancer Center, please go directly to the Cancer Center and check in at the registration area.   Wear comfortable clothing and clothing appropriate for easy access to any Portacath or PICC line.   We strive to give you quality time with your provider. You may need to reschedule your appointment if you arrive late (15 or more minutes).  Arriving late affects you and other patients whose appointments are after yours.  Also, if you miss three or more appointments without notifying the office, you may be dismissed from the clinic at the provider's discretion.      For prescription refill requests, have your pharmacy contact our office and allow 72 hours for refills to be completed.    Today you received the following chemotherapy and/or immunotherapy agents: Cyramza, Taxotere      To help prevent nausea and vomiting after your treatment, we encourage you to take your nausea medication as directed.  BELOW ARE SYMPTOMS THAT SHOULD BE REPORTED IMMEDIATELY: *FEVER GREATER THAN 100.4 F (38 C) OR HIGHER *CHILLS OR SWEATING *NAUSEA AND VOMITING THAT IS NOT CONTROLLED WITH YOUR NAUSEA MEDICATION *UNUSUAL SHORTNESS OF BREATH *UNUSUAL BRUISING OR BLEEDING *URINARY PROBLEMS (pain or burning when urinating, or frequent urination) *BOWEL PROBLEMS (unusual diarrhea, constipation, pain near the anus) TENDERNESS IN MOUTH AND THROAT WITH OR WITHOUT PRESENCE OF ULCERS (sore throat, sores in mouth, or a toothache) UNUSUAL RASH, SWELLING OR PAIN  UNUSUAL VAGINAL DISCHARGE OR ITCHING   Items with * indicate a potential emergency and should be followed up as soon as possible or go to the Emergency Department if any problems should occur.  Please show the CHEMOTHERAPY ALERT CARD or  IMMUNOTHERAPY ALERT CARD at check-in to the Emergency Department and triage nurse.  Should you have questions after your visit or need to cancel or reschedule your appointment, please contact [facility] - A DEPT OF [facility]  Dept: [phone]  and follow the prompts.  Office hours are 8:00 a.m. to 4:30 p.m. Monday - Friday. Please note that voicemails left after 4:00 p.m. may not be returned until the following business day.  We are closed weekends and major holidays. You have access to a nurse at all times for urgent questions. Please call the main number to the clinic Dept: [phone] and follow the prompts.   For any non-urgent questions, you may also contact your provider using MyChart. We now offer e-Visits for anyone 67 and older to request care online for non-urgent symptoms. For details visit [email or web address].   Also download the MyChart app! Go to the app store, search "MyChart", open the app, select [place], and log in with your MyChart username and password.

## 2023-09-29 LAB — T4: T4, Total: 8.5 ug/dL (ref 4.5–12.0)

## 2023-09-30 ENCOUNTER — Other Ambulatory Visit: Payer: Self-pay

## 2023-09-30 ENCOUNTER — Emergency Department (HOSPITAL_COMMUNITY)

## 2023-09-30 ENCOUNTER — Inpatient Hospital Stay

## 2023-09-30 ENCOUNTER — Emergency Department (HOSPITAL_COMMUNITY)
Admission: EM | Admit: 2023-09-30 | Discharge: 2023-09-30 | Disposition: A | Discharge status: Home / Self Care | Attending: Emergency Medicine | Admitting: Emergency Medicine

## 2023-09-30 VITALS — BP 141/82 | HR 130 | Resp 18

## 2023-09-30 DIAGNOSIS — Z794 Long term (current) use of insulin: Secondary | ICD-10-CM | POA: Insufficient documentation

## 2023-09-30 DIAGNOSIS — M47816 Spondylosis without myelopathy or radiculopathy, lumbar region: Secondary | ICD-10-CM | POA: Diagnosis not present

## 2023-09-30 DIAGNOSIS — C3490 Malignant neoplasm of unspecified part of unspecified bronchus or lung: Secondary | ICD-10-CM | POA: Insufficient documentation

## 2023-09-30 DIAGNOSIS — W01198A Fall on same level from slipping, tripping and stumbling with subsequent striking against other object, initial encounter: Secondary | ICD-10-CM | POA: Diagnosis not present

## 2023-09-30 DIAGNOSIS — Z9181 History of falling: Secondary | ICD-10-CM | POA: Diagnosis present

## 2023-09-30 DIAGNOSIS — Z79890 Hormone replacement therapy (postmenopausal): Secondary | ICD-10-CM | POA: Diagnosis not present

## 2023-09-30 DIAGNOSIS — S066X0A Traumatic subarachnoid hemorrhage without loss of consciousness, initial encounter: Secondary | ICD-10-CM | POA: Diagnosis not present

## 2023-09-30 DIAGNOSIS — C7951 Secondary malignant neoplasm of bone: Secondary | ICD-10-CM

## 2023-09-30 DIAGNOSIS — W19XXXA Unspecified fall, initial encounter: Secondary | ICD-10-CM

## 2023-09-30 DIAGNOSIS — R519 Headache, unspecified: Secondary | ICD-10-CM | POA: Diagnosis present

## 2023-09-30 DIAGNOSIS — I609 Nontraumatic subarachnoid hemorrhage, unspecified: Secondary | ICD-10-CM | POA: Diagnosis not present

## 2023-09-30 DIAGNOSIS — Z7982 Long term (current) use of aspirin: Secondary | ICD-10-CM | POA: Diagnosis not present

## 2023-09-30 DIAGNOSIS — S0990XA Unspecified injury of head, initial encounter: Secondary | ICD-10-CM

## 2023-09-30 DIAGNOSIS — Z043 Encounter for examination and observation following other accident: Secondary | ICD-10-CM | POA: Diagnosis not present

## 2023-09-30 DIAGNOSIS — S0003XA Contusion of scalp, initial encounter: Secondary | ICD-10-CM | POA: Diagnosis not present

## 2023-09-30 LAB — CBC WITH DIFFERENTIAL/PLATELET
Abs Immature Granulocytes: 0.05 10*3/uL (ref 0.00–0.07)
Basophils Absolute: 0 10*3/uL (ref 0.0–0.1)
Basophils Relative: 0 %
Eosinophils Absolute: 0 10*3/uL (ref 0.0–0.5)
Eosinophils Relative: 0 %
HCT: 21.4 % — ABNORMAL LOW (ref 39.0–52.0)
Hemoglobin: 6.6 g/dL — CL (ref 13.0–17.0)
Immature Granulocytes: 1 %
Lymphocytes Relative: 3 %
Lymphs Abs: 0.2 10*3/uL — ABNORMAL LOW (ref 0.7–4.0)
MCH: 34.4 pg — ABNORMAL HIGH (ref 26.0–34.0)
MCHC: 30.8 g/dL (ref 30.0–36.0)
MCV: 111.5 fL — ABNORMAL HIGH (ref 80.0–100.0)
Monocytes Absolute: 0.1 10*3/uL (ref 0.1–1.0)
Monocytes Relative: 1 %
Neutro Abs: 7.5 10*3/uL (ref 1.7–7.7)
Neutrophils Relative %: 95 %
Platelets: 120 10*3/uL — ABNORMAL LOW (ref 150–400)
RBC: 1.92 MIL/uL — ABNORMAL LOW (ref 4.22–5.81)
RDW: 21.2 % — ABNORMAL HIGH (ref 11.5–15.5)
WBC: 7.9 10*3/uL (ref 4.0–10.5)
nRBC: 0.3 % — ABNORMAL HIGH (ref 0.0–0.2)

## 2023-09-30 LAB — BASIC METABOLIC PANEL WITH GFR
Anion gap: 10 (ref 5–15)
BUN: 34 mg/dL — ABNORMAL HIGH (ref 8–23)
CO2: 20 mmol/L — ABNORMAL LOW (ref 22–32)
Calcium: 8.7 mg/dL — ABNORMAL LOW (ref 8.9–10.3)
Chloride: 105 mmol/L (ref 98–111)
Creatinine, Ser: 0.9 mg/dL (ref 0.61–1.24)
GFR, Estimated: >60 mL/min (ref >60)
Glucose, Bld: 405 mg/dL — ABNORMAL HIGH (ref 70–99)
Potassium: 4.7 mmol/L (ref 3.5–5.1)
Sodium: 135 mmol/L (ref 135–145)

## 2023-09-30 LAB — PROTIME-INR
INR: 1 (ref 0.8–1.2)
Prothrombin Time: 13.5 s (ref 11.4–15.2)

## 2023-09-30 LAB — PREPARE RBC (CROSSMATCH)

## 2023-09-30 MED ORDER — SODIUM CHLORIDE 0.9% IV SOLUTION: Freq: Once | INTRAVENOUS | Status: DC

## 2023-09-30 MED ORDER — OXYCODONE HCL 5 MG PO TABS
5.0000 mg | ORAL_TABLET | Freq: Once | ORAL | Status: AC
  Administered 2023-09-30: 5 mg via ORAL
  Filled 2023-09-30: qty 1

## 2023-09-30 MED ORDER — PEGFILGRASTIM-CBQV 6 MG/0.6ML ~~LOC~~ SOSY
6.0000 mg | PREFILLED_SYRINGE | Freq: Once | SUBCUTANEOUS | Status: AC
  Administered 2023-09-30: 6 mg via SUBCUTANEOUS

## 2023-09-30 MED ORDER — HEPARIN SOD (PORK) LOCK FLUSH 100 UNIT/ML IV SOLN
500.0000 [IU] | Freq: Once | INTRAVENOUS | Status: DC
  Filled 2023-09-30: qty 5

## 2023-09-30 NOTE — ED Triage Notes (Signed)
 Patient to ED by POV. Patient was heading to cancer center fell hitting back of head. No LOC takes ASA daily. Complains of left side head pain.

## 2023-09-30 NOTE — ED Provider Notes (Signed)
 Patient was initially seen by Dr. Reba Camper.  Please see his note.  Patient was at the cancer center today when he had a slip and fall.  Patient complained of mild headache following the fall.  During his ED workup he was noted to have a hemoglobin of 6.6.  He also had a CT scan that showed areas of subarachnoid hemorrhage.  He reviewed the case with Dr. Ellery Guthrie who reviewed the images.  He recommended 6-hour follow-up imaging.  If negative patient could be discharged home.  Regarding his hemoglobin patient does have chronic issues of anemia requiring blood transfusion.  Patient received a blood transfusion in the ED today.  He had a Hemoccult test in the ED that was negative for acute bleeding.  Repeat CT scan shows slight interval increase.  I reviewed the CT scan endings with Dr. Ellery Guthrie.  Patient is still appropriate for discharge and observation at home.  Patient is comfortable with this plan and would prefer to go home.   Trish Furl, MD 09/30/23 [phone]

## 2023-09-30 NOTE — Progress Notes (Unsigned)
 Subject had a fall prior to entering the building today approx 910 this am. He fell getting out of his car and was witnessed by his spouse. NO LOC, He was alert and oriented and SZP submitted. On call MD notified and after his injection in clinic he was taken to ER immediately to their triage.

## 2023-09-30 NOTE — ED Provider Notes (Signed)
 [facility] AT [facility] Provider Note   CSN: [id number] Arrival date & time: 09/30/23  0935     History  Chief Complaint  Patient presents with   Jonathan Page    Jonathan Page is a 73 y.o. male.  73 year old male with prior medical history as detailed below presents for evaluation.  Patient reports that he was on the way to the cancer center for an injection.  While coming to the cancer center he had a slip and fall.  He fell backwards.  He did strike his head.  He did not pass out.  He complains of mild headache now.  He was able to complete his appointment at the cancer center and then was brought to the ED for evaluation.  He denies LOC.  He denies significant neck pain.  He denies extremity injury.  He is requesting an x-ray of his left hip.  He reports that he has had a prior fracture there.  He is able to ambulate.  He is concerned that the hardware in his left hip could have been displaced by the fracture.  The history is provided by the patient.       Home Medications Prior to Admission medications   Medication Sig Start Date End Date Taking? Authorizing Provider  aspirin  EC 81 MG tablet Take 81 mg by mouth daily.    [provider]  Blood Glucose Monitoring Suppl (ACCU-CHEK AVIVA PLUS) w/Device KIT Check blood sugar before breakfast, lunch and bedtime and as directed. Dx E11.65 07/13/17   [name], NP  Blood Glucose Monitoring Suppl DEVI 1 each by Does not apply route as directed. May substitute to any manufacturer covered by patient's insurance. 08/23/23   Dorothe Gaster, NP  dexamethasone  (DECADRON ) 4 MG tablet Take 2 tabs by mouth 2 times daily starting day before chemo. Then take 2 tabs daily for 2 days starting day after chemo. Take with food. 09/18/23   Marlene Simas, MD  diclofenac  Sodium (VOLTAREN ) 1 % GEL Apply 2 g topically 4 (four) times daily. 09/08/23   Magnant, Charles L, PA-C  docusate sodium  (COLACE) 100 MG capsule  Take 1 capsule (100 mg total) by mouth 2 (two) times daily. 07/26/23   Magnant, Charles L, PA-C  empagliflozin  (JARDIANCE ) 10 MG TABS tablet Take 1 tablet (10 mg total) by mouth daily before breakfast. for diabetes. 09/11/23   Clark, Katherine K, NP  folic acid  (FOLVITE ) 1 MG tablet Take 1 mg by mouth in the morning.    [provider]  gabapentin  (NEURONTIN ) 300 MG capsule Take 1 capsule (300 mg total) by mouth at bedtime. Patient taking differently: Take 300 mg by mouth in the morning. 07/03/23   Heilingoetter, Cassandra L, PA-C  glipiZIDE  (GLUCOTROL  XL) 10 MG 24 hr tablet Take 1 tablet (10 mg total) by mouth daily with breakfast. for diabetes. 05/29/23   Helaine Llanos, MD  glucose blood (ACCU-CHEK AVIVA PLUS) test strip Check blood sugar before breakfast, lunch and bedtime and as directed. Dx E11.65 08/30/23   [name], NP  insulin  NPH-regular Human (70-30) 100 UNIT/ML injection Inject 5 Units into the skin daily with breakfast.    [provider]  Lancets (ACCU-CHEK SOFT TOUCH) lancets Check blood sugar before breakfast, lunch and bedtime and as directed. Dx E11.65 07/13/17   Clark, Katherine K, NP  lidocaine -prilocaine  (EMLA ) cream Apply 1 Application topically as needed. 06/29/23   Marlene Simas, MD  lisinopril  (ZESTRIL ) 10 MG  tablet Take 1 tablet (10 mg total) by mouth daily. for blood pressure. 07/11/23   Clark, Katherine K, NP  methocarbamol  (ROBAXIN ) 500 MG tablet Take 1 tablet (500 mg total) by mouth every 8 (eight) hours as needed for muscle spasms. 07/26/23   Magnant, Justice Olp, PA-C  ondansetron  (ZOFRAN ) 8 MG tablet Take 8 mg by mouth every 8 (eight) hours as needed for nausea or vomiting.    [provider]  oxyCODONE  (OXY IR/ROXICODONE ) 5 MG immediate release tablet Take 1 tablet (5 mg total) by mouth every 8 (eight) hours as needed for moderate pain (pain score 4-6). 09/27/23   Marlene Simas, MD  prochlorperazine  (COMPAZINE ) 10 MG tablet Take 1  tablet (10 mg total) by mouth every 6 (six) hours as needed for nausea or vomiting. 06/29/23   Marlene Simas, MD      Allergies    Patient has no known allergies.    Review of Systems   Review of Systems  All other systems reviewed and are negative.   Physical Exam Updated Vital Signs BP 114/65   Pulse (!) 120   Temp (!) 97.5 F (36.4 C) (Oral)   Resp 16   Ht 6\' 1"  (1.854 m)   Wt 65.8 kg   SpO2 99%   BMI 19.13 kg/m  Physical Exam Vitals and nursing note reviewed.  Constitutional:      General: He is not in acute distress.    Appearance: Normal appearance. He is well-developed.  HENT:     Head: Normocephalic and atraumatic.  Eyes:     Conjunctiva/sclera: Conjunctivae normal.     Pupils: Pupils are equal, round, and reactive to light.  Cardiovascular:     Rate and Rhythm: Normal rate and regular rhythm.     Heart sounds: Normal heart sounds.  Pulmonary:     Effort: Pulmonary effort is normal. No respiratory distress.     Breath sounds: Normal breath sounds.  Abdominal:     General: There is no distension.     Palpations: Abdomen is soft.     Tenderness: There is no abdominal tenderness.  Musculoskeletal:        General: No deformity. Normal range of motion.     Cervical back: Normal range of motion and neck supple.  Skin:    General: Skin is warm and dry.  Neurological:     General: No focal deficit present.     Mental Status: He is alert and oriented to person, place, and time.     ED Results / Procedures / Treatments   Labs (all labs ordered are listed, but only abnormal results are displayed) Labs Reviewed - No data to display  EKG None  Radiology No results found.  Procedures Procedures    Medications Ordered in ED Medications  oxyCODONE  (Oxy IR/ROXICODONE ) immediate release tablet 5 mg (has no administration in time range)    ED Course/ Medical Decision Making/ A&P Clinical Course as of 09/30/23 1222  Sat Sep 30, 2023  1149 CT Head Wo  Contrast [PM]    Clinical Course User Index [PM] Burnette Carte, MD                                 Medical Decision Making Amount and/or Complexity of Data Reviewed Labs: ordered. Radiology: ordered. Decision-making details documented in ED Course.  Risk Prescription drug management.    Medical Screen Complete  This patient presented  to the ED with complaint of fall, head injury.  This complaint involves an extensive number of treatment options. The initial differential diagnosis includes, but is not limited to, intracranial injury such as subarachnoid or subdural hemorrhage, fracture, etc.  This presentation is: Acute, Chronic, Self-Limited, Previously Undiagnosed, Uncertain Prognosis, Complicated, Systemic Symptoms, and Threat to Life/Bodily Function Patient with known metastatic lung cancer.  Takes aspirin  every morning.  He had a fall this morning and hit his head.  Obtained imaging is concerning for acute subarachnoid hemorrhage.  Patient is asymptomatic without headache or nausea or other symptoms.  Case and images reviewed with Dr. Ellery Guthrie with neurosurgery.  He recommends repeat imaging in 6 hours.  If there is no progression of the hemorrhage and the patient remains asymptomatic then the patient is appropriate for discharge home.  Patient is accompanied by his wife.  The patient absolutely desires discharge if at all possible.  Additionally, on screening labs patient is anemic with a hemoglobin of 6.6.  Patient reports that this has been an issue before.  It has been a few weeks since his last blood transfusion.  He is agreeable with transfusion today while awaiting repeat CT head.  He denies recent blood loss.  He denies GI bleeding.   Patient does take aspirin  every day.  Patient is advised that if he is able to be discharged he will need to hold his aspirin  for the next 1 week.  Oncoming EDP aware of case.    Co morbidities that complicated the patient's  evaluation  See HPI    Additional history obtained:  External records from outside sources obtained and reviewed including prior ED visits and prior Inpatient records.    Problem List / ED Course:  Fall, head injury, SAH   Reevaluation:  After the interventions noted above, I reevaluated the patient and found that they have: improved   Disposition:  After consideration of the diagnostic results and the patients response to treatment, I feel that the patent would benefit from completion of ED evaluation.   CRITICAL CARE Performed by: Burnette Carte   Total critical care time: 30 minutes  Critical care time was exclusive of separately billable procedures and treating other patients.  Critical care was necessary to treat or prevent imminent or life-threatening deterioration.  Critical care was time spent personally by me on the following activities: development of treatment plan with patient and/or surrogate as well as nursing, discussions with consultants, evaluation of patient's response to treatment, examination of patient, obtaining history from patient or surrogate, ordering and performing treatments and interventions, ordering and review of laboratory studies, ordering and review of radiographic studies, pulse oximetry and re-evaluation of patient's condition.          Final Clinical Impression(s) / ED Diagnoses Final diagnoses:  Fall, initial encounter  Injury of head, initial encounter  SAH (subarachnoid hemorrhage) (HCC)    Rx / DC Orders ED Discharge Orders     None         Burnette Carte, MD 09/30/23 1243

## 2023-09-30 NOTE — Discharge Instructions (Signed)
 Return to the emergency room if you have worsening headache confusion or other concerning symptoms.  Avoid taking aspirin  for the next week.  Follow-up with your oncologist to have your hemoglobin rechecked

## 2023-10-02 ENCOUNTER — Encounter: Payer: Self-pay | Admitting: Nurse Practitioner

## 2023-10-02 ENCOUNTER — Inpatient Hospital Stay

## 2023-10-02 ENCOUNTER — Encounter

## 2023-10-02 ENCOUNTER — Other Ambulatory Visit: Payer: Self-pay

## 2023-10-02 ENCOUNTER — Inpatient Hospital Stay (HOSPITAL_COMMUNITY)
Admission: EM | Admit: 2023-10-02 | Discharge: 2023-10-06 | DRG: 083 | Disposition: A | Discharge status: Skilled Nursing Facility | Attending: Internal Medicine | Admitting: Internal Medicine

## 2023-10-02 ENCOUNTER — Emergency Department (HOSPITAL_COMMUNITY)

## 2023-10-02 ENCOUNTER — Inpatient Hospital Stay (HOSPITAL_BASED_OUTPATIENT_CLINIC_OR_DEPARTMENT_OTHER): Admitting: Nurse Practitioner

## 2023-10-02 ENCOUNTER — Encounter (HOSPITAL_COMMUNITY): Payer: Self-pay | Admitting: Radiology

## 2023-10-02 ENCOUNTER — Other Ambulatory Visit: Payer: Medicare HMO

## 2023-10-02 VITALS — BP 89/63 | HR 117 | Temp 98.3°F | Resp 18 | Ht 73.0 in

## 2023-10-02 DIAGNOSIS — I2510 Atherosclerotic heart disease of native coronary artery without angina pectoris: Secondary | ICD-10-CM | POA: Diagnosis present

## 2023-10-02 DIAGNOSIS — I10 Essential (primary) hypertension: Secondary | ICD-10-CM | POA: Diagnosis not present

## 2023-10-02 DIAGNOSIS — I959 Hypotension, unspecified: Secondary | ICD-10-CM | POA: Diagnosis not present

## 2023-10-02 DIAGNOSIS — R54 Age-related physical debility: Secondary | ICD-10-CM | POA: Diagnosis present

## 2023-10-02 DIAGNOSIS — I6200 Nontraumatic subdural hemorrhage, unspecified: Secondary | ICD-10-CM | POA: Diagnosis not present

## 2023-10-02 DIAGNOSIS — I609 Nontraumatic subarachnoid hemorrhage, unspecified: Principal | ICD-10-CM

## 2023-10-02 DIAGNOSIS — S12500A Unspecified displaced fracture of sixth cervical vertebra, initial encounter for closed fracture: Secondary | ICD-10-CM | POA: Diagnosis not present

## 2023-10-02 DIAGNOSIS — Z7982 Long term (current) use of aspirin: Secondary | ICD-10-CM | POA: Diagnosis not present

## 2023-10-02 DIAGNOSIS — M8448XS Pathological fracture, other site, sequela: Secondary | ICD-10-CM | POA: Diagnosis not present

## 2023-10-02 DIAGNOSIS — W19XXXA Unspecified fall, initial encounter: Secondary | ICD-10-CM | POA: Diagnosis not present

## 2023-10-02 DIAGNOSIS — Z7401 Bed confinement status: Secondary | ICD-10-CM | POA: Diagnosis not present

## 2023-10-02 DIAGNOSIS — S066XAA Traumatic subarachnoid hemorrhage with loss of consciousness status unknown, initial encounter: Principal | ICD-10-CM | POA: Diagnosis present

## 2023-10-02 DIAGNOSIS — Z794 Long term (current) use of insulin: Secondary | ICD-10-CM | POA: Diagnosis not present

## 2023-10-02 DIAGNOSIS — Z79899 Other long term (current) drug therapy: Secondary | ICD-10-CM

## 2023-10-02 DIAGNOSIS — C3492 Malignant neoplasm of unspecified part of left bronchus or lung: Secondary | ICD-10-CM | POA: Diagnosis present

## 2023-10-02 DIAGNOSIS — C799 Secondary malignant neoplasm of unspecified site: Secondary | ICD-10-CM | POA: Diagnosis not present

## 2023-10-02 DIAGNOSIS — D6481 Anemia due to antineoplastic chemotherapy: Secondary | ICD-10-CM | POA: Diagnosis present

## 2023-10-02 DIAGNOSIS — Z7984 Long term (current) use of oral hypoglycemic drugs: Secondary | ICD-10-CM | POA: Diagnosis not present

## 2023-10-02 DIAGNOSIS — C7951 Secondary malignant neoplasm of bone: Secondary | ICD-10-CM

## 2023-10-02 DIAGNOSIS — R079 Chest pain, unspecified: Secondary | ICD-10-CM | POA: Diagnosis not present

## 2023-10-02 DIAGNOSIS — R911 Solitary pulmonary nodule: Secondary | ICD-10-CM | POA: Diagnosis not present

## 2023-10-02 DIAGNOSIS — I129 Hypertensive chronic kidney disease with stage 1 through stage 4 chronic kidney disease, or unspecified chronic kidney disease: Secondary | ICD-10-CM | POA: Diagnosis not present

## 2023-10-02 DIAGNOSIS — Z515 Encounter for palliative care: Secondary | ICD-10-CM | POA: Diagnosis not present

## 2023-10-02 DIAGNOSIS — D509 Iron deficiency anemia, unspecified: Secondary | ICD-10-CM | POA: Diagnosis not present

## 2023-10-02 DIAGNOSIS — J189 Pneumonia, unspecified organism: Secondary | ICD-10-CM | POA: Diagnosis not present

## 2023-10-02 DIAGNOSIS — C3490 Malignant neoplasm of unspecified part of unspecified bronchus or lung: Secondary | ICD-10-CM

## 2023-10-02 DIAGNOSIS — J90 Pleural effusion, not elsewhere classified: Secondary | ICD-10-CM | POA: Diagnosis not present

## 2023-10-02 DIAGNOSIS — D6959 Other secondary thrombocytopenia: Secondary | ICD-10-CM | POA: Diagnosis present

## 2023-10-02 DIAGNOSIS — E1122 Type 2 diabetes mellitus with diabetic chronic kidney disease: Secondary | ICD-10-CM | POA: Diagnosis not present

## 2023-10-02 DIAGNOSIS — R531 Weakness: Secondary | ICD-10-CM | POA: Diagnosis not present

## 2023-10-02 DIAGNOSIS — D529 Folate deficiency anemia, unspecified: Secondary | ICD-10-CM | POA: Diagnosis not present

## 2023-10-02 DIAGNOSIS — C801 Malignant (primary) neoplasm, unspecified: Secondary | ICD-10-CM | POA: Diagnosis not present

## 2023-10-02 DIAGNOSIS — Z7969 Long term (current) use of other immunomodulators and immunosuppressants: Secondary | ICD-10-CM | POA: Diagnosis not present

## 2023-10-02 DIAGNOSIS — Z87891 Personal history of nicotine dependence: Secondary | ICD-10-CM | POA: Diagnosis not present

## 2023-10-02 DIAGNOSIS — W1830XA Fall on same level, unspecified, initial encounter: Secondary | ICD-10-CM | POA: Diagnosis present

## 2023-10-02 DIAGNOSIS — T451X5A Adverse effect of antineoplastic and immunosuppressive drugs, initial encounter: Secondary | ICD-10-CM | POA: Diagnosis present

## 2023-10-02 DIAGNOSIS — N1831 Chronic kidney disease, stage 3a: Secondary | ICD-10-CM | POA: Diagnosis not present

## 2023-10-02 DIAGNOSIS — E46 Unspecified protein-calorie malnutrition: Secondary | ICD-10-CM | POA: Diagnosis not present

## 2023-10-02 DIAGNOSIS — M25552 Pain in left hip: Secondary | ICD-10-CM | POA: Diagnosis not present

## 2023-10-02 DIAGNOSIS — M6281 Muscle weakness (generalized): Secondary | ICD-10-CM | POA: Diagnosis not present

## 2023-10-02 DIAGNOSIS — S066X0D Traumatic subarachnoid hemorrhage without loss of consciousness, subsequent encounter: Secondary | ICD-10-CM | POA: Diagnosis not present

## 2023-10-02 DIAGNOSIS — Z452 Encounter for adjustment and management of vascular access device: Secondary | ICD-10-CM | POA: Diagnosis not present

## 2023-10-02 DIAGNOSIS — J439 Emphysema, unspecified: Secondary | ICD-10-CM | POA: Diagnosis not present

## 2023-10-02 DIAGNOSIS — K5900 Constipation, unspecified: Secondary | ICD-10-CM | POA: Diagnosis not present

## 2023-10-02 DIAGNOSIS — W19XXXD Unspecified fall, subsequent encounter: Secondary | ICD-10-CM | POA: Diagnosis not present

## 2023-10-02 DIAGNOSIS — D72829 Elevated white blood cell count, unspecified: Secondary | ICD-10-CM | POA: Diagnosis present

## 2023-10-02 DIAGNOSIS — M84552D Pathological fracture in neoplastic disease, left femur, subsequent encounter for fracture with routine healing: Secondary | ICD-10-CM | POA: Diagnosis not present

## 2023-10-02 DIAGNOSIS — Z833 Family history of diabetes mellitus: Secondary | ICD-10-CM | POA: Diagnosis not present

## 2023-10-02 DIAGNOSIS — R000 Tachycardia, unspecified: Secondary | ICD-10-CM | POA: Diagnosis not present

## 2023-10-02 DIAGNOSIS — E1165 Type 2 diabetes mellitus with hyperglycemia: Secondary | ICD-10-CM | POA: Diagnosis not present

## 2023-10-02 DIAGNOSIS — D320 Benign neoplasm of cerebral meninges: Secondary | ICD-10-CM | POA: Diagnosis not present

## 2023-10-02 DIAGNOSIS — R296 Repeated falls: Secondary | ICD-10-CM | POA: Diagnosis present

## 2023-10-02 LAB — CBC WITH DIFFERENTIAL/PLATELET
Abs Immature Granulocytes: 2.64 10*3/uL — ABNORMAL HIGH (ref 0.00–0.07)
Basophils Absolute: 0.1 10*3/uL (ref 0.0–0.1)
Basophils Relative: 1 %
Eosinophils Absolute: 0 10*3/uL (ref 0.0–0.5)
Eosinophils Relative: 0 %
HCT: 25 % — ABNORMAL LOW (ref 39.0–52.0)
Hemoglobin: 7.8 g/dL — ABNORMAL LOW (ref 13.0–17.0)
Immature Granulocytes: 15 %
Lymphocytes Relative: 4 %
Lymphs Abs: 0.7 10*3/uL (ref 0.7–4.0)
MCH: 33.5 pg (ref 26.0–34.0)
MCHC: 31.2 g/dL (ref 30.0–36.0)
MCV: 107.3 fL — ABNORMAL HIGH (ref 80.0–100.0)
Monocytes Absolute: 0.3 10*3/uL (ref 0.1–1.0)
Monocytes Relative: 2 %
Neutro Abs: 14 10*3/uL — ABNORMAL HIGH (ref 1.7–7.7)
Neutrophils Relative %: 78 %
Platelets: 121 10*3/uL — ABNORMAL LOW (ref 150–400)
RBC: 2.33 MIL/uL — ABNORMAL LOW (ref 4.22–5.81)
RDW: 21.6 % — ABNORMAL HIGH (ref 11.5–15.5)
WBC: 17.8 10*3/uL — ABNORMAL HIGH (ref 4.0–10.5)
nRBC: 0 % (ref 0.0–0.2)

## 2023-10-02 LAB — BPAM RBC
Blood Product Expiration Date: 202505202359
ISSUE DATE / TIME: 202504191313
Unit Type and Rh: 5100

## 2023-10-02 LAB — COMPREHENSIVE METABOLIC PANEL WITH GFR
ALT: 10 U/L (ref 0–44)
AST: 16 U/L (ref 15–41)
Albumin: 3.3 g/dL — ABNORMAL LOW (ref 3.5–5.0)
Alkaline Phosphatase: 243 U/L — ABNORMAL HIGH (ref 38–126)
Anion gap: 10 (ref 5–15)
BUN: 33 mg/dL — ABNORMAL HIGH (ref 8–23)
CO2: 21 mmol/L — ABNORMAL LOW (ref 22–32)
Calcium: 9.2 mg/dL (ref 8.9–10.3)
Chloride: 104 mmol/L (ref 98–111)
Creatinine, Ser: 1.04 mg/dL (ref 0.61–1.24)
GFR, Estimated: >60 mL/min (ref >60)
Glucose, Bld: 234 mg/dL — ABNORMAL HIGH (ref 70–99)
Potassium: 4.2 mmol/L (ref 3.5–5.1)
Sodium: 135 mmol/L (ref 135–145)
Total Bilirubin: 1.2 mg/dL (ref 0.0–1.2)
Total Protein: 6.1 g/dL — ABNORMAL LOW (ref 6.5–8.1)

## 2023-10-02 LAB — TYPE AND SCREEN
ABO/RH(D): O POS
Antibody Screen: NEGATIVE
Unit division: 0

## 2023-10-02 LAB — PROTIME-INR
INR: 1.1 (ref 0.8–1.2)
Prothrombin Time: 14.4 s (ref 11.4–15.2)

## 2023-10-02 MED ORDER — ONDANSETRON HCL 4 MG PO TABS: 4.0000 mg | ORAL_TABLET | Freq: Four times a day (QID) | ORAL | Status: DC | PRN

## 2023-10-02 MED ORDER — SODIUM CHLORIDE 0.9 % IV BOLUS
1000.0000 mL | Freq: Once | INTRAVENOUS | Status: AC
  Administered 2023-10-02: 1000 mL via INTRAVENOUS

## 2023-10-02 MED ORDER — ACETAMINOPHEN 325 MG PO TABS
650.0000 mg | ORAL_TABLET | Freq: Four times a day (QID) | ORAL | Status: DC | PRN
  Administered 2023-10-02 – 2023-10-03 (×2): 650 mg via ORAL
  Filled 2023-10-02 (×2): qty 2

## 2023-10-02 MED ORDER — LISINOPRIL 10 MG PO TABS
10.0000 mg | ORAL_TABLET | Freq: Every day | ORAL | Status: DC
  Administered 2023-10-03 – 2023-10-06 (×3): 10 mg via ORAL
  Filled 2023-10-02 (×4): qty 1

## 2023-10-02 MED ORDER — INSULIN ASPART 100 UNIT/ML IJ SOLN
0.0000 [IU] | Freq: Three times a day (TID) | INTRAMUSCULAR | Status: DC
  Filled 2023-10-02: qty 0.24

## 2023-10-02 MED ORDER — GABAPENTIN 300 MG PO CAPS
300.0000 mg | ORAL_CAPSULE | Freq: Every day | ORAL | Status: DC
  Administered 2023-10-02 – 2023-10-05 (×4): 300 mg via ORAL
  Filled 2023-10-02 (×4): qty 1

## 2023-10-02 MED ORDER — ENOXAPARIN SODIUM 40 MG/0.4ML IJ SOSY: 40.0000 mg | PREFILLED_SYRINGE | INTRAMUSCULAR | Status: DC

## 2023-10-02 MED ORDER — ACETAMINOPHEN 650 MG RE SUPP: 650.0000 mg | Freq: Four times a day (QID) | RECTAL | Status: DC | PRN

## 2023-10-02 MED ORDER — ONDANSETRON HCL 4 MG/2ML IJ SOLN: 4.0000 mg | Freq: Four times a day (QID) | INTRAMUSCULAR | Status: DC | PRN

## 2023-10-02 MED ORDER — ASPIRIN 81 MG PO TBEC: 81.0000 mg | DELAYED_RELEASE_TABLET | Freq: Every day | ORAL | Status: DC

## 2023-10-02 MED ORDER — OXYCODONE HCL 5 MG PO TABS
5.0000 mg | ORAL_TABLET | ORAL | Status: DC | PRN
  Administered 2023-10-02 – 2023-10-05 (×6): 5 mg via ORAL
  Filled 2023-10-02 (×7): qty 1

## 2023-10-02 NOTE — ED Provider Notes (Signed)
 [place] EMERGENCY DEPARTMENT AT [facility] Provider Note   CSN: [id number] Arrival date & time: 10/02/23  1614     History  Chief Complaint  Patient presents with   Hypotension    Jonathan Page is a 73 y.o. male.  With a history of metastatic non-small cell carcinoma of the lung, CKD, type 2 diabetes and recurrent anemia who presents to the ED for weakness.  Patient is currently undergoing chemotherapy for treatment of his lung cancer and is followed by Dr. Marguerita Shih here with hematology oncology.  He was seen 2 days ago here in the ED after a fall while entering the cancer center.  He was found to have subarachnoid hemorrhage at that time and received blood transfusion for recurrent anemia.  He returns today After being found to be hypotensive with systolic blood pressure in the 80 at the cancer center today during outpatient lab draw.  Patient also suffered another mechanical fall yesterday while visiting his mother in the nursing home.  He did strike his head and has had left hip pain since that time.  He was not seen in the ED after that fall.  Reports the swelling on his left forehead has gone down but still having some left hip pain.  He walks with a walker and cane.  No reported GI bleeding, chest pain, shortness of breath fevers or chills.  He was previously taking daily aspirin  81 mg but stopped this after subarachnoid hemorrhage 2 days ago.  No other anticoagulation.  HPI     Home Medications Prior to Admission medications   Medication Sig Start Date End Date Taking? Authorizing Provider  aspirin  EC 81 MG tablet Take 81 mg by mouth daily.    [provider]  Blood Glucose Monitoring Suppl (ACCU-CHEK AVIVA PLUS) w/Device KIT Check blood sugar before breakfast, lunch and bedtime and as directed. Dx E11.65 07/13/17   [name], NP  Blood Glucose Monitoring Suppl DEVI 1 each by Does not apply route as directed. May substitute to any manufacturer covered  by patient's insurance. 08/23/23   Dorothe Gaster, NP  dexamethasone  (DECADRON ) 4 MG tablet Take 2 tabs by mouth 2 times daily starting day before chemo. Then take 2 tabs daily for 2 days starting day after chemo. Take with food. 09/18/23   Marlene Simas, MD  diclofenac  Sodium (VOLTAREN ) 1 % GEL Apply 2 g topically 4 (four) times daily. 09/08/23   Magnant, Charles L, PA-C  docusate sodium  (COLACE) 100 MG capsule Take 1 capsule (100 mg total) by mouth 2 (two) times daily. 07/26/23   Magnant, Justice Olp, PA-C  empagliflozin  (JARDIANCE ) 10 MG TABS tablet Take 1 tablet (10 mg total) by mouth daily before breakfast. for diabetes. 09/11/23   Clark, Katherine K, NP  folic acid  (FOLVITE ) 1 MG tablet Take 1 mg by mouth in the morning.    [provider]  gabapentin  (NEURONTIN ) 300 MG capsule Take 1 capsule (300 mg total) by mouth at bedtime. Patient taking differently: Take 300 mg by mouth in the morning. 07/03/23   Heilingoetter, Cassandra L, PA-C  glipiZIDE  (GLUCOTROL  XL) 10 MG 24 hr tablet Take 1 tablet (10 mg total) by mouth daily with breakfast. for diabetes. 05/29/23   Helaine Llanos, MD  glucose blood (ACCU-CHEK AVIVA PLUS) test strip Check blood sugar before breakfast, lunch and bedtime and as directed. Dx E11.65 08/30/23   [name], NP  insulin  NPH-regular Human (70-30) 100 UNIT/ML injection Inject 5  Units into the skin daily with breakfast.    [provider]  Lancets (ACCU-CHEK SOFT TOUCH) lancets Check blood sugar before breakfast, lunch and bedtime and as directed. Dx E11.65 07/13/17   Clark, Katherine K, NP  lidocaine -prilocaine  (EMLA ) cream Apply 1 Application topically as needed. 06/29/23   Marlene Simas, MD  lisinopril  (ZESTRIL ) 10 MG tablet Take 1 tablet (10 mg total) by mouth daily. for blood pressure. 07/11/23   Clark, Katherine K, NP  methocarbamol  (ROBAXIN ) 500 MG tablet Take 1 tablet (500 mg total) by mouth every 8 (eight) hours as needed for muscle spasms.  07/26/23   Magnant, Justice Olp, PA-C  ondansetron  (ZOFRAN ) 8 MG tablet Take 8 mg by mouth every 8 (eight) hours as needed for nausea or vomiting.    [provider]  oxyCODONE  (OXY IR/ROXICODONE ) 5 MG immediate release tablet Take 1 tablet (5 mg total) by mouth every 8 (eight) hours as needed for moderate pain (pain score 4-6). 09/27/23   Marlene Simas, MD  prochlorperazine  (COMPAZINE ) 10 MG tablet Take 1 tablet (10 mg total) by mouth every 6 (six) hours as needed for nausea or vomiting. 06/29/23   Marlene Simas, MD      Allergies    Patient has no known allergies.    Review of Systems   Review of Systems  Physical Exam Updated Vital Signs BP 102/70 (BP Location: Left Arm)   Pulse 99   Temp 97.8 F (36.6 C) (Oral)   Resp 17   Ht 6\' 1"  (1.854 m)   Wt 63.5 kg   SpO2 99%   BMI 18.47 kg/m  Physical Exam Vitals and nursing note reviewed.  HENT:     Head:     Comments: Small abrasion over left frontal region without palpable deformity Eyes:     Pupils: Pupils are equal, round, and reactive to light.  Cardiovascular:     Rate and Rhythm: Normal rate and regular rhythm.  Pulmonary:     Effort: Pulmonary effort is normal.     Breath sounds: Normal breath sounds.  Abdominal:     Palpations: Abdomen is soft.     Tenderness: There is no abdominal tenderness.  Musculoskeletal:     Comments: Right anterior chest port in place No anterior chest wall tenderness Anterior left hip tenderness without rotation, shortening or other deformity Sensation intact to light touch throughout bilateral lower extremities Symmetric weakness with 4 out of 5 strength bilateral lower extremities Atrophy of bilateral lower extremities No cervical thoracic midline tenderness step-off deformities  Skin:    General: Skin is warm and dry.  Neurological:     General: No focal deficit present.     Mental Status: He is alert.     Sensory: No sensory deficit.     Motor: No weakness.   Psychiatric:        Mood and Affect: Mood normal.     ED Results / Procedures / Treatments   Labs (all labs ordered are listed, but only abnormal results are displayed) Labs Reviewed  COMPREHENSIVE METABOLIC PANEL WITH GFR - Abnormal; Notable for the following components:      Result Value   CO2 21 (*)    Glucose, Bld 234 (*)    BUN 33 (*)    Total Protein 6.1 (*)    Albumin 3.3 (*)    Alkaline Phosphatase 243 (*)    All other components within normal limits  CBC WITH DIFFERENTIAL/PLATELET - Abnormal; Notable for the following components:  WBC 17.8 (*)    RBC 2.33 (*)    Hemoglobin 7.8 (*)    HCT 25.0 (*)    MCV 107.3 (*)    RDW 21.6 (*)    Platelets 121 (*)    Neutro Abs 14.0 (*)    Abs Immature Granulocytes 2.64 (*)    All other components within normal limits  PROTIME-INR  TYPE AND SCREEN    EKG None  Radiology DG Pelvis Portable Result Date: 10/02/2023 CLINICAL DATA:  Fall with left hip pain. EXAM: PORTABLE PELVIS 1-2 VIEWS COMPARISON:  CT 09/11/2023 FINDINGS: IM rod and screw fixation left femur. No acute fracture or dislocation. Permeative of appearance of the medial cortex of the proximal left femur suspicious for metastases. IMPRESSION: No acute fracture or dislocation. Permeative of appearance of the medial cortex of the proximal left femur suspicious for metastases. Electronically Signed   By: Rozell Cornet M.D.   On: 10/02/2023 20:44   DG Chest Portable 1 View Result Date: 10/02/2023 CLINICAL DATA:  Lung cancer. EXAM: PORTABLE CHEST 1 VIEW COMPARISON:  None Available. FINDINGS: Unremarkable cardiac size. Extensive alveolar opacification left mid to lower lung consistent with pneumonia. Left-sided moderate pleural effusion. No pneumothorax. There is small area of alveolar opacity in the right mid hemithorax. Right-sided Port-A-Cath tip overlies distal SVC. IMPRESSION: Bilateral pulmonary opacities, left greater than right consistent with pneumonia, edema or  mass. Left-sided effusion. Electronically Signed   By: Sydell Eva M.D.   On: 10/02/2023 20:40   CT Head Wo Contrast Addendum Date: 10/02/2023 ADDENDUM REPORT: 10/02/2023 20:36 ADDENDUM: Critical Value/emergent results were called by telephone at the time of interpretation on 10/02/2023 at 8:36 pm to provider [facility] , who verbally acknowledged these results. Electronically Signed   By: Juanetta Nordmann M.D.   On: 10/02/2023 20:36   Result Date: 10/02/2023 CLINICAL DATA:  Fall EXAM: CT HEAD WITHOUT CONTRAST CT CERVICAL SPINE WITHOUT CONTRAST TECHNIQUE: Multidetector CT imaging of the head and cervical spine was performed following the standard protocol without intravenous contrast. Multiplanar CT image reconstructions of the cervical spine were also generated. RADIATION DOSE REDUCTION: This exam was performed according to the departmental dose-optimization program which includes automated exposure control, adjustment of the mA and/or kV according to patient size and/or use of iterative reconstruction technique. COMPARISON:  09/30/2023 FINDINGS: CT HEAD FINDINGS Brain: Multifocal acute subarachnoid hemorrhage over both hemispheres, greatest at the right sylvian fissure. The amount of hemorrhage is worsened since the prior examination. No midline shift or other mass effect. Confluent white matter hypoattenuation. Mild volume loss. Vascular: No hyperdense vessel or unexpected calcification. Skull: No fracture. Sinuses/Orbits: No acute finding. Other: None. CT CERVICAL SPINE FINDINGS Alignment: Normal. Skull base and vertebrae: There is widespread osseous metastatic disease affecting the ribs, cervical spine and lower skull base. Unchanged appearance of pathologic fractures at T1 and C6. No new fracture. Soft tissues and spinal canal: Hyperdense foci within the left subarticular zone at the C1-2 level. Disc levels:  No high-grade spinal canal stenosis. Upper chest: 6 mm right apical pulmonary nodule.  Incompletely visualized left pleural effusion. Other: None IMPRESSION: 1. Multifocal acute subarachnoid hemorrhage over both hemispheres, greatest at the right Sylvian fissure. The amount of hemorrhage is worsened since the prior examination. No midline shift or other mass effect. 2. Widespread osseous metastatic disease affecting the ribs, cervical spine and lower skull base. Unchanged appearance of pathologic fractures at T1 and C6. No new fracture. 3. Unchanged hyperdense foci within the left subarticular zone at  the C1-2 level, favored to be a meningioma. 4. Incompletely visualized left pleural effusion. 5. Unchanged 6 mm right apical pulmonary nodule. Electronically Signed: By: Juanetta Nordmann M.D. On: 10/02/2023 20:27   CT Cervical Spine Wo Contrast Addendum Date: 10/02/2023 ADDENDUM REPORT: 10/02/2023 20:36 ADDENDUM: Critical Value/emergent results were called by telephone at the time of interpretation on 10/02/2023 at 8:36 pm to provider [facility] , who verbally acknowledged these results. Electronically Signed   By: Juanetta Nordmann M.D.   On: 10/02/2023 20:36   Result Date: 10/02/2023 CLINICAL DATA:  Fall EXAM: CT HEAD WITHOUT CONTRAST CT CERVICAL SPINE WITHOUT CONTRAST TECHNIQUE: Multidetector CT imaging of the head and cervical spine was performed following the standard protocol without intravenous contrast. Multiplanar CT image reconstructions of the cervical spine were also generated. RADIATION DOSE REDUCTION: This exam was performed according to the departmental dose-optimization program which includes automated exposure control, adjustment of the mA and/or kV according to patient size and/or use of iterative reconstruction technique. COMPARISON:  09/30/2023 FINDINGS: CT HEAD FINDINGS Brain: Multifocal acute subarachnoid hemorrhage over both hemispheres, greatest at the right sylvian fissure. The amount of hemorrhage is worsened since the prior examination. No midline shift or other mass effect.  Confluent white matter hypoattenuation. Mild volume loss. Vascular: No hyperdense vessel or unexpected calcification. Skull: No fracture. Sinuses/Orbits: No acute finding. Other: None. CT CERVICAL SPINE FINDINGS Alignment: Normal. Skull base and vertebrae: There is widespread osseous metastatic disease affecting the ribs, cervical spine and lower skull base. Unchanged appearance of pathologic fractures at T1 and C6. No new fracture. Soft tissues and spinal canal: Hyperdense foci within the left subarticular zone at the C1-2 level. Disc levels:  No high-grade spinal canal stenosis. Upper chest: 6 mm right apical pulmonary nodule. Incompletely visualized left pleural effusion. Other: None IMPRESSION: 1. Multifocal acute subarachnoid hemorrhage over both hemispheres, greatest at the right Sylvian fissure. The amount of hemorrhage is worsened since the prior examination. No midline shift or other mass effect. 2. Widespread osseous metastatic disease affecting the ribs, cervical spine and lower skull base. Unchanged appearance of pathologic fractures at T1 and C6. No new fracture. 3. Unchanged hyperdense foci within the left subarticular zone at the C1-2 level, favored to be a meningioma. 4. Incompletely visualized left pleural effusion. 5. Unchanged 6 mm right apical pulmonary nodule. Electronically Signed: By: Juanetta Nordmann M.D. On: 10/02/2023 20:27    Procedures Procedures    Medications Ordered in ED Medications  sodium chloride  0.9 % bolus 1,000 mL (0 mLs Intravenous Stopped 10/02/23 1833)    ED Course/ Medical Decision Making/ A&P Clinical Course as of 10/02/23 2150  Mon Oct 02, 2023  1820 Reviewed laboratory workup.  Hemoglobin of 7.8.  No indication for another transfusion today.  Awaiting results of CT head C-spine chest and pelvis x-rays [MP]  1958 Patient remains hemodynamically stable systolic blood pressure in low 100s [MP]  2047 Call taken from radiology.  Interval increase in subarachnoid  hemorrhage without midline shift or mass effect.  Patient remains neurologically intact at this time.  Will page neurosurgery [MP]  2127 Discussed with Dr. Gwendlyn Lemmings (neurosurgery) who recommends repeat CT head early tomorrow morning and admission to medicine service for continued observation.  Every 4 hours neurochecks. [MP]  2149 Discussed with admitting hospitalist accepts patient for admission [MP]    Clinical Course User Index [MP] Sallyanne Creamer, DO  Medical Decision Making 73 year old male with history as above recently seen here after a fall found to have subarachnoid hemorrhage 2 days ago.  Cleared for discharge after there was no interval increase in hemorrhage.  Was feeling okay yesterday but did not eat much.  He suffered another mechanical fall while walking with his walker yesterday with another head strike in the left frontal region.  He does have a little abrasion in this area.  Now with increased left hip pain from his baseline.  Noted to be hypotensive at cancer center earlier today and sent here for further evaluation.  Will obtain laboratory workup to evaluate for recurrent anemia and ascertain need for another blood transfusion here in the ED.  Last infusion was 2 days ago in the ED.  Will also look for evidence of traumatic findings given he had another fall with CT head C-spine chest x-ray and pelvis x-ray.  Initial blood pressure here 93/78.  Will provide IV fluids for rehydration and await results of laboratory workup and imaging  Amount and/or Complexity of Data Reviewed Labs: ordered. Radiology: ordered.  Risk Decision regarding hospitalization.           Final Clinical Impression(s) / ED Diagnoses Final diagnoses:  SAH (subarachnoid hemorrhage) (HCC)  Malignant neoplasm of lung, unspecified laterality, unspecified part of lung [facility])    Rx / DC Orders ED Discharge Orders     None         Sallyanne Creamer, DO 10/02/23  2150

## 2023-10-02 NOTE — Progress Notes (Signed)
 Pt with NSSCA of lung under current chemo.  No anticoagulation or thrombocytopenia.  Pt with small volume multifocal sah on ct.  No focal exam.    Rec admit for obs. Mobililize with pt. Check fu scan in am if worse please call.  Ok to feed and mobilize

## 2023-10-02 NOTE — ED Notes (Signed)
 Pt girlfriend let this nurse and the MD know that he did have another fall yesterday striking his head on the floor again.

## 2023-10-02 NOTE — ED Triage Notes (Signed)
 Pt was at the cancer center to have blood work done and they found him to be hypotensive in the 80s. Pt is asymptomatic. He did have a fall on Saturday at which time he was evaluated and found to have a subarachnoid hemorrhage.  Follow up scan showed it was stable and he was discharged home. He endorses not eating much yesterday but other than his left leg hurting he feels fine. He states he had a rod placed due to a fracture about 3.5 weeks ago.   Pt chest port is accessed.

## 2023-10-02 NOTE — H&P (Signed)
 History and Physical    Jonathan Page WJX:[id number] DOB: 1950-09-02 DOA: 10/02/2023  PCP: [name], NP   Chief Complaint: fall  HPI: Jonathan Page is a 73 y.o. male with medical history significant of metastatic non-small cell lung cancer, CKD, type 2 diabetes who presents emergency department due to weakness.  Patient is on chemotherapy for his lung cancer and 2 days ago had a fall.  He was found to have a subarachnoid hemorrhage and was discharged.  He presents today with recurrent fall and hypotension.  On arrival he was afebrile and hemodynamic stable.  Labs were obtained on presentation which showed creatinine 1.04, WBC 17.8, hemoglobin 7.8.  Patient underwent CT head which showed multifocal subarachnoid hemorrhage worse from prior exam.  CT spine showed no fracture.  Neurosurgery was consulted in the emergency department and recommended repeat imaging in the morning with every 4 hours neurochecks.  Patient was admitted for further workup.  On admission he had nonfocal neurologic exam.   Review of Systems: Review of Systems  Constitutional:  Negative for chills and fever.  HENT: Negative.    Eyes: Negative.   Respiratory: Negative.    Cardiovascular: Negative.   Gastrointestinal: Negative.   Musculoskeletal:  Positive for falls.  Skin: Negative.   Neurological:  Positive for dizziness.  Psychiatric/Behavioral: Negative.    All other systems reviewed and are negative.    As per HPI otherwise 10 point review of systems negative.   No Known Allergies  Past Medical History:  Diagnosis Date   Allergy    Cancer (HCC)    Cellulitis 02/21/2018   Concussion syndrome    Diabetes (HCC)    Frequent headaches    Hypertension     Past Surgical History:  Procedure Laterality Date   APPENDECTOMY  1960   BRONCHIAL BIOPSY  06/12/2023   Procedure: BRONCHIAL BIOPSIES;  Surgeon: Prudy Brownie, DO;  Location: [facility];  Service: Cardiopulmonary;;   BRONCHIAL BRUSHINGS   06/12/2023   Procedure: BRONCHIAL BRUSHINGS;  Surgeon: Prudy Brownie, DO;  Location: [facility];  Service: Cardiopulmonary;;   COLONOSCOPY     FEMUR IM NAIL Left 07/25/2023   Procedure: LEFT HIP INTRAMEDULLARY HIP SCREW;  Surgeon: Jasmine Mesi, MD;  Location: [facility] OR;  Service: Orthopedics;  Laterality: Left;   HIP SURGERY Left 07/25/2023   metal pin placed in L hip area   IR IMAGING GUIDED PORT INSERTION  07/13/2023   VIDEO BRONCHOSCOPY Bilateral 06/12/2023   Procedure: VIDEO BRONCHOSCOPY;  Surgeon: Prudy Brownie, DO;  Location: [facility];  Service: Cardiopulmonary;  Laterality: Bilateral;   WISDOM TOOTH EXTRACTION       reports that he quit smoking about 4 years ago. His smoking use included cigarettes and cigars. He started smoking about 34 years ago. He has a 15 pack-year smoking history. He has never used smokeless tobacco. He reports current alcohol use of about 6.0 standard drinks of alcohol per week. He reports that he does not use drugs.  Family History  Problem Relation Age of Onset   Diabetes Father    Diabetes Paternal Grandfather    Diabetes Paternal Grandmother    Colon cancer Neg Hx    Crohn's disease Neg Hx    Rectal cancer Neg Hx    Stomach cancer Neg Hx    Esophageal cancer Neg Hx     Prior to Admission medications   Medication Sig Start Date End Date Taking? Authorizing Provider  aspirin  EC 81 MG tablet  Take 81 mg by mouth daily.    [provider]  Blood Glucose Monitoring Suppl (ACCU-CHEK AVIVA PLUS) w/Device KIT Check blood sugar before breakfast, lunch and bedtime and as directed. Dx E11.65 07/13/17   [name], NP  Blood Glucose Monitoring Suppl DEVI 1 each by Does not apply route as directed. May substitute to any manufacturer covered by patient's insurance. 08/23/23   Dorothe Gaster, NP  dexamethasone  (DECADRON ) 4 MG tablet Take 2 tabs by mouth 2 times daily starting day before chemo. Then take 2 tabs daily for 2 days starting  day after chemo. Take with food. 09/18/23   Marlene Simas, MD  diclofenac  Sodium (VOLTAREN ) 1 % GEL Apply 2 g topically 4 (four) times daily. 09/08/23   Magnant, Charles L, PA-C  docusate sodium  (COLACE) 100 MG capsule Take 1 capsule (100 mg total) by mouth 2 (two) times daily. 07/26/23   Magnant, Justice Olp, PA-C  empagliflozin  (JARDIANCE ) 10 MG TABS tablet Take 1 tablet (10 mg total) by mouth daily before breakfast. for diabetes. 09/11/23   Clark, Katherine K, NP  folic acid  (FOLVITE ) 1 MG tablet Take 1 mg by mouth in the morning.    [provider]  gabapentin  (NEURONTIN ) 300 MG capsule Take 1 capsule (300 mg total) by mouth at bedtime. Patient taking differently: Take 300 mg by mouth in the morning. 07/03/23   Heilingoetter, Cassandra L, PA-C  glipiZIDE  (GLUCOTROL  XL) 10 MG 24 hr tablet Take 1 tablet (10 mg total) by mouth daily with breakfast. for diabetes. 05/29/23   Helaine Llanos, MD  glucose blood (ACCU-CHEK AVIVA PLUS) test strip Check blood sugar before breakfast, lunch and bedtime and as directed. Dx E11.65 08/30/23   Clark, Katherine K, NP  insulin  NPH-regular Human (70-30) 100 UNIT/ML injection Inject 5 Units into the skin daily with breakfast.    [provider]  Lancets (ACCU-CHEK SOFT TOUCH) lancets Check blood sugar before breakfast, lunch and bedtime and as directed. Dx E11.65 07/13/17   Clark, Katherine K, NP  lidocaine -prilocaine  (EMLA ) cream Apply 1 Application topically as needed. 06/29/23   Marlene Simas, MD  lisinopril  (ZESTRIL ) 10 MG tablet Take 1 tablet (10 mg total) by mouth daily. for blood pressure. 07/11/23   Clark, Katherine K, NP  methocarbamol  (ROBAXIN ) 500 MG tablet Take 1 tablet (500 mg total) by mouth every 8 (eight) hours as needed for muscle spasms. 07/26/23   Magnant, Charles L, PA-C  ondansetron  (ZOFRAN ) 8 MG tablet Take 8 mg by mouth every 8 (eight) hours as needed for nausea or vomiting.    [provider]  oxyCODONE  (OXY  IR/ROXICODONE ) 5 MG immediate release tablet Take 1 tablet (5 mg total) by mouth every 8 (eight) hours as needed for moderate pain (pain score 4-6). 09/27/23   Marlene Simas, MD  prochlorperazine  (COMPAZINE ) 10 MG tablet Take 1 tablet (10 mg total) by mouth every 6 (six) hours as needed for nausea or vomiting. 06/29/23   Marlene Simas, MD    Physical Exam: Vitals:   10/02/23 1830 10/02/23 1900 10/02/23 1930 10/02/23 2041  BP: 101/70 101/68 110/73 102/70  Pulse: 100 99 100 99  Resp: 14 15 15 17   Temp:    97.8 F (36.6 C)  TempSrc:    Oral  SpO2: 98% 98% 99% 99%  Weight:      Height:       Physical Exam Vitals reviewed.  Constitutional:      Appearance: He is normal weight.  HENT:  Head: Normocephalic.     Mouth/Throat:     Mouth: Mucous membranes are moist.     Pharynx: Oropharynx is clear.  Eyes:     Pupils: Pupils are equal, round, and reactive to light.  Cardiovascular:     Rate and Rhythm: Normal rate and regular rhythm.  Abdominal:     General: Bowel sounds are normal.  Musculoskeletal:        General: Normal range of motion.  Skin:    General: Skin is warm.     Capillary Refill: Capillary refill takes less than 2 seconds.  Neurological:     General: No focal deficit present.     Mental Status: He is alert.        Labs on Admission: I have personally reviewed the patients's labs and imaging studies.  Assessment/Plan Principal Problem:   Subarachnoid hemorrhage (HCC)   # Mechanical fall complicated by subarachnoid hemorrhage - Patient had recurrent fall over the last several days - No evidence of fracture - Multifocal subarachnoid hemorrhage worsening from recent CT scan - Neurosurgery consulted and recommended repeat scan in morning with neurochecks  Plan: Advance diet as tolerated Every 4 hours neurochecks CT scan in the morning  Hypertension-continue lisinopril   # CAD-continue aspirin   # Metastatic non-small cell lung cancer-continue to  monitor  # Chronic anemia-likely related to chemotherapy-continue to monitor  # Thrombocytopenia-likely due to chemotherapy # Type 2 diabetes-patient's glucose found to be in 200s.  Will place on sliding scale.    Admission status: Observation Telemetry  Certification: The appropriate patient status for this patient is OBSERVATION. Observation status is judged to be reasonable and necessary in order to provide the required intensity of service to ensure the patient's safety. The patient's presenting symptoms, physical exam findings, and initial radiographic and laboratory data in the context of their medical condition is felt to place them at decreased risk for further clinical deterioration. Furthermore, it is anticipated that the patient will be medically stable for discharge from the hospital within 2 midnights of admission.     Myrl Askew MD [facility] If 7PM-7AM, please contact night-coverage [email or web address]  10/02/2023, 10:06 PM

## 2023-10-02 NOTE — Progress Notes (Unsigned)
 Pt accessed with powerport and biopatch (see flowsheets). Pt wheeled to ED room 12, report given to ED RN, no further needs at this time. Pt wife aware of pt ED room number.

## 2023-10-03 ENCOUNTER — Encounter: Payer: Self-pay | Admitting: Physician Assistant

## 2023-10-03 ENCOUNTER — Encounter: Payer: Self-pay | Admitting: Internal Medicine

## 2023-10-03 ENCOUNTER — Observation Stay (HOSPITAL_COMMUNITY)

## 2023-10-03 DIAGNOSIS — C801 Malignant (primary) neoplasm, unspecified: Secondary | ICD-10-CM | POA: Diagnosis not present

## 2023-10-03 DIAGNOSIS — C7951 Secondary malignant neoplasm of bone: Secondary | ICD-10-CM | POA: Diagnosis not present

## 2023-10-03 DIAGNOSIS — I6200 Nontraumatic subdural hemorrhage, unspecified: Secondary | ICD-10-CM | POA: Diagnosis not present

## 2023-10-03 DIAGNOSIS — I609 Nontraumatic subarachnoid hemorrhage, unspecified: Secondary | ICD-10-CM | POA: Diagnosis not present

## 2023-10-03 LAB — CBC
HCT: 23.8 % — ABNORMAL LOW (ref 39.0–52.0)
Hemoglobin: 7.5 g/dL — ABNORMAL LOW (ref 13.0–17.0)
MCH: 33.9 pg (ref 26.0–34.0)
MCHC: 31.5 g/dL (ref 30.0–36.0)
MCV: 107.7 fL — ABNORMAL HIGH (ref 80.0–100.0)
Platelets: 122 10*3/uL — ABNORMAL LOW (ref 150–400)
RBC: 2.21 MIL/uL — ABNORMAL LOW (ref 4.22–5.81)
RDW: 21 % — ABNORMAL HIGH (ref 11.5–15.5)
WBC: 12.4 10*3/uL — ABNORMAL HIGH (ref 4.0–10.5)
nRBC: 0 % (ref 0.0–0.2)

## 2023-10-03 LAB — CBG MONITORING, ED: Glucose-Capillary: 153 mg/dL — ABNORMAL HIGH (ref 70–99)

## 2023-10-03 LAB — GLUCOSE, CAPILLARY
Glucose-Capillary: 202 mg/dL — ABNORMAL HIGH (ref 70–99)
Glucose-Capillary: 341 mg/dL — ABNORMAL HIGH (ref 70–99)

## 2023-10-03 MED ORDER — ENSURE ENLIVE PO LIQD
237.0000 mL | Freq: Two times a day (BID) | ORAL | Status: DC
  Administered 2023-10-03 – 2023-10-06 (×6): 237 mL via ORAL

## 2023-10-03 MED ORDER — SODIUM CHLORIDE 0.9% FLUSH: 10.0000 mL | INTRAVENOUS | Status: DC | PRN

## 2023-10-03 MED ORDER — SODIUM CHLORIDE 0.9% FLUSH
10.0000 mL | Freq: Two times a day (BID) | INTRAVENOUS | Status: DC
  Administered 2023-10-03 – 2023-10-06 (×5): 10 mL

## 2023-10-03 MED ORDER — CHLORHEXIDINE GLUCONATE CLOTH 2 % EX PADS
6.0000 | MEDICATED_PAD | Freq: Every day | CUTANEOUS | Status: DC
  Administered 2023-10-03 – 2023-10-06 (×4): 6 via TOPICAL

## 2023-10-03 MED ORDER — INSULIN ASPART 100 UNIT/ML IJ SOLN
0.0000 [IU] | Freq: Three times a day (TID) | INTRAMUSCULAR | Status: DC
  Administered 2023-10-03: 2 [IU] via SUBCUTANEOUS
  Administered 2023-10-03: 3 [IU] via SUBCUTANEOUS
  Administered 2023-10-04: 1 [IU] via SUBCUTANEOUS
  Administered 2023-10-04: 3 [IU] via SUBCUTANEOUS
  Administered 2023-10-04: 2 [IU] via SUBCUTANEOUS
  Administered 2023-10-05 (×2): 3 [IU] via SUBCUTANEOUS
  Administered 2023-10-05: 2 [IU] via SUBCUTANEOUS
  Administered 2023-10-06: 1 [IU] via SUBCUTANEOUS
  Administered 2023-10-06: 2 [IU] via SUBCUTANEOUS
  Filled 2023-10-03: qty 0.09

## 2023-10-03 MED ORDER — INSULIN ASPART 100 UNIT/ML IJ SOLN
3.0000 [IU] | Freq: Once | INTRAMUSCULAR | Status: AC
  Administered 2023-10-03: 3 [IU] via SUBCUTANEOUS

## 2023-10-03 NOTE — Progress Notes (Signed)
 PROGRESS NOTE Jonathan Page  ZOX:[id number] DOB: 23-Dec-1950 DOA: 10/02/2023 PCP: [name], NP  Brief Narrative/Hospital Course: 29 yomw/history significant for metastatic NSC Lung CA bundle of CKD IIIa b/l creat 1.3,T2DM who presented ED due to weakness. Patient is on chemotherapy for his lung cancer and 2 days  PTA had a fall, was found to have a SAH in CT 09/30/22 and was discharged but presented again on 4/21 for recurrent fall and hypotension. In the ED afebrile hemodynamics fairly stable labs reviewed leukocytosis creatinine 1 Hb 7.8 CT head>>showed multifocal subarachnoid hemorrhage worse from prior exam. CT spine>>no fracture. NSS was consulted in the ED and recommended repeat imaging and q4hr neurochecks and was admitted.  Subjective: Seen this am Aao, no new complaints or weakness Has weakness on left leg since his hip surgery 3 wks ago and able ot sue walker Overnight BP stable afebrile Labs showing stable hemoglobin leukocytosis improving Repeat CT-unchanged SAH  Assessment/Plan:  Traumatic SAH Mechanical falls: Patient with fall and imaging showing SAH.  Repeat CT 4/22 AM stable per neurosurgery no further intervention needed. Obtain PT OT eval, anticipated SNF placement due to recurrent fall Monitor neurochecks  Hypertension: Well-controlled on lisinopril    CAD: On asa.  No chest pain.  Will hold aspirin  in the setting of SAH until cleared by neurosurgery  Metastatic non-small cell lung cancer: Patient on chemo follow-up with outpatient oncology  Leukocytosis: Likely reactive.  No fever.  Downtrending  Chronic anemia Thrombocytopenia: Likely related to chemotherapy-continue to monitor, transfuse PRBC if hb <7gm  T2DM: Blood sugar poorly controlled in 200S. ADD SSI No results for input(s): "GLUCAP", "HGBA1C" in the last 168 hours.   DVT prophylaxis: SCDs Start: 10/02/23 2201 Code Status:   Code Status: Full Code Family Communication: plan of care  discussed with patient at bedside. Patient status is: Remains hospitalized because of severity of illness Level of care: Telemetry   Dispo: The patient is from: home            Anticipated disposition: TBD pending ptot  Objective: Vitals last 24 hrs: Vitals:   10/03/23 0803 10/03/23 0805 10/03/23 1100 10/03/23 1212  BP: 107/74  106/65   Pulse: (!) 104  (!) 105   Resp: 18  14   Temp:  98 F (36.7 C)  97.9 F (36.6 C)  TempSrc:  Oral  Oral  SpO2: 98%  99%   Weight:      Height:       Weight change:   Physical Examination: General exam: alert awake, older than stated age HEENT:Oral mucosa moist, Ear/Nose WNL grossly Respiratory system: Bilaterally diminished BS, no use of accessory muscle Cardiovascular system: S1 & S2 +. Gastrointestinal system: Abdomen soft, NT,ND,BS+ Nervous System: Alert, awake,following commands, difficulty moving LLE due to hip surgery. Extremities: LE edema neg, moving arms, warm legs Skin: No rashes,warm. MSK: Normal muscle bulk/tone.   Medications reviewed:  Scheduled Meds:  gabapentin   300 mg Oral QHS   insulin  aspart  0-9 Units Subcutaneous TID WC   lisinopril   10 mg Oral Daily   Continuous Infusions:    Diet Order             Diet regular Room service appropriate? Yes; Fluid consistency: Thin  Diet effective now                          Intake/Output Summary (Last 24 hours) at 10/03/2023 1327 Last data filed at 10/02/2023 8119 Gross  per 24 hour  Intake 1000 ml  Output --  Net 1000 ml   Net IO Since Admission: 1,000 mL [10/03/23 1327]  Wt Readings from Last 3 Encounters:  10/02/23 63.5 kg  09/30/23 65.8 kg  09/28/23 63.5 kg    Unresulted Labs (From admission, onward)     Start     Ordered   10/04/23 0500  Basic metabolic panel with GFR  Daily,   R      10/03/23 0820   10/04/23 0500  CBC  Daily,   R      10/03/23 0820          Data Reviewed: I have personally reviewed following labs and imaging studies ( see  epic result tab) CBC: Recent Labs  Lab 09/28/23 0833 09/30/23 1138 10/02/23 1620 10/03/23 0615  WBC 15.6* 7.9 17.8* 12.4*  NEUTROABS 13.5* 7.5 14.0*  --   HGB 8.9* 6.6* 7.8* 7.5*  HCT 27.8* 21.4* 25.0* 23.8*  MCV 105.7* 111.5* 107.3* 107.7*  PLT 168 120* 121* 122*   CMP: Recent Labs  Lab 09/28/23 0833 09/30/23 1138 10/02/23 1620  NA 137 135 135  K 3.9 4.7 4.2  CL 102 105 104  CO2 24 20* 21*  GLUCOSE 431* 405* 234*  BUN 25* 34* 33*  CREATININE 1.46* 0.90 1.04  CALCIUM  10.3 8.7* 9.2   GFR: Estimated Creatinine Clearance: 57.7 mL/min (by C-G formula based on SCr of 1.04 mg/dL). Recent Labs  Lab 09/28/23 0833 10/02/23 1620  AST 10* 16  ALT 7 10  ALKPHOS 326* 243*  BILITOT 0.6 1.2  PROT 7.1 6.1*  ALBUMIN  4.1 3.3*   No results for input(s): "LIPASE", "AMYLASE" in the last 168 hours. No results for input(s): "AMMONIA" in the last 168 hours. Coagulation Profile:  Recent Labs  Lab 09/30/23 1138 10/02/23 1620  INR 1.0 1.1   No results for input(s): "PROBNP" in the last 168 hours.  No results for input(s): "HGBA1C" in the last 72 hours. Recent Labs  Lab 10/03/23 0854  GLUCAP 153*   No results for input(s): "CHOL", "HDL", "LDLCALC", "TRIG", "CHOLHDL", "LDLDIRECT" in the last 72 hours. No results for input(s): "TSH", "T4TOTAL", "FREET4", "T3FREE", "THYROIDAB" in the last 72 hours. Sepsis Labs:No results for input(s): "PROCALCITON", "LATICACIDVEN" in the last 168 hours. No results found for this or any previous visit (from the past 240 hours).  Antimicrobials/Microbiology: Anti-infectives (From admission, onward)    None      No results found for: "SDES", "SPECREQUEST", "CULT", "REPTSTATUS"  Radiology Studies: CT Head Wo Contrast Result Date: 10/03/2023 CLINICAL DATA:  Subarachnoid hemorrhage follow-up EXAM: CT HEAD WITHOUT CONTRAST TECHNIQUE: Contiguous axial images were obtained from the base of the skull through the vertex without intravenous contrast.  RADIATION DOSE REDUCTION: This exam was performed according to the departmental dose-optimization program which includes automated exposure control, adjustment of the mA and/or kV according to patient size and/or use of iterative reconstruction technique. COMPARISON:  Yesterday FINDINGS: Brain: Similar pattern and degree of patchy subarachnoid hemorrhage scattered along bilateral sulci, up to 7 mm in thickness at the left vertex. Trace left parafalcine subdural hemorrhage is noted along the posterior falx, stable. No evidence of infarct, hydrocephalus, or brain edema. There is cerebral volume loss with mild low-density in the cerebral white matter. No evidence of acute infarct. Vascular: No hyperdense vessel or unexpected calcification. Skull: Lytic appearance at the right occipital condyle, known osseous metastatic disease by prior report. Sinuses/Orbits: No acute finding IMPRESSION: Unchanged patchy subarachnoid hemorrhage  along the bilateral cerebral convexity when compared to yesterday. Trace left parafalcine subdural hematoma shows no interval thickening. Electronically Signed   By: Ronnette Coke M.D.   On: 10/03/2023 05:39   DG Pelvis Portable Result Date: 10/02/2023 CLINICAL DATA:  Fall with left hip pain. EXAM: PORTABLE PELVIS 1-2 VIEWS COMPARISON:  CT 09/11/2023 FINDINGS: IM rod and screw fixation left femur. No acute fracture or dislocation. Permeative of appearance of the medial cortex of the proximal left femur suspicious for metastases. IMPRESSION: No acute fracture or dislocation. Permeative of appearance of the medial cortex of the proximal left femur suspicious for metastases. Electronically Signed   By: Rozell Cornet M.D.   On: 10/02/2023 20:44   DG Chest Portable 1 View Result Date: 10/02/2023 CLINICAL DATA:  Lung cancer. EXAM: PORTABLE CHEST 1 VIEW COMPARISON:  None Available. FINDINGS: Unremarkable cardiac size. Extensive alveolar opacification left mid to lower lung consistent with  pneumonia. Left-sided moderate pleural effusion. No pneumothorax. There is small area of alveolar opacity in the right mid hemithorax. Right-sided Port-A-Cath tip overlies distal SVC. IMPRESSION: Bilateral pulmonary opacities, left greater than right consistent with pneumonia, edema or mass. Left-sided effusion. Electronically Signed   By: Sydell Eva M.D.   On: 10/02/2023 20:40   CT Head Wo Contrast Addendum Date: 10/02/2023 ADDENDUM REPORT: 10/02/2023 20:36 ADDENDUM: Critical Value/emergent results were called by telephone at the time of interpretation on 10/02/2023 at 8:36 pm to provider [facility] , who verbally acknowledged these results. Electronically Signed   By: Juanetta Nordmann M.D.   On: 10/02/2023 20:36   Result Date: 10/02/2023 CLINICAL DATA:  Fall EXAM: CT HEAD WITHOUT CONTRAST CT CERVICAL SPINE WITHOUT CONTRAST TECHNIQUE: Multidetector CT imaging of the head and cervical spine was performed following the standard protocol without intravenous contrast. Multiplanar CT image reconstructions of the cervical spine were also generated. RADIATION DOSE REDUCTION: This exam was performed according to the departmental dose-optimization program which includes automated exposure control, adjustment of the mA and/or kV according to patient size and/or use of iterative reconstruction technique. COMPARISON:  09/30/2023 FINDINGS: CT HEAD FINDINGS Brain: Multifocal acute subarachnoid hemorrhage over both hemispheres, greatest at the right sylvian fissure. The amount of hemorrhage is worsened since the prior examination. No midline shift or other mass effect. Confluent white matter hypoattenuation. Mild volume loss. Vascular: No hyperdense vessel or unexpected calcification. Skull: No fracture. Sinuses/Orbits: No acute finding. Other: None. CT CERVICAL SPINE FINDINGS Alignment: Normal. Skull base and vertebrae: There is widespread osseous metastatic disease affecting the ribs, cervical spine and lower skull  base. Unchanged appearance of pathologic fractures at T1 and C6. No new fracture. Soft tissues and spinal canal: Hyperdense foci within the left subarticular zone at the C1-2 level. Disc levels:  No high-grade spinal canal stenosis. Upper chest: 6 mm right apical pulmonary nodule. Incompletely visualized left pleural effusion. Other: None IMPRESSION: 1. Multifocal acute subarachnoid hemorrhage over both hemispheres, greatest at the right Sylvian fissure. The amount of hemorrhage is worsened since the prior examination. No midline shift or other mass effect. 2. Widespread osseous metastatic disease affecting the ribs, cervical spine and lower skull base. Unchanged appearance of pathologic fractures at T1 and C6. No new fracture. 3. Unchanged hyperdense foci within the left subarticular zone at the C1-2 level, favored to be a meningioma. 4. Incompletely visualized left pleural effusion. 5. Unchanged 6 mm right apical pulmonary nodule. Electronically Signed: By: Juanetta Nordmann M.D. On: 10/02/2023 20:27   CT Cervical Spine Wo Contrast Addendum Date: 10/02/2023  ADDENDUM REPORT: 10/02/2023 20:36 ADDENDUM: Critical Value/emergent results were called by telephone at the time of interpretation on 10/02/2023 at 8:36 pm to provider [facility] , who verbally acknowledged these results. Electronically Signed   By: Juanetta Nordmann M.D.   On: 10/02/2023 20:36   Result Date: 10/02/2023 CLINICAL DATA:  Fall EXAM: CT HEAD WITHOUT CONTRAST CT CERVICAL SPINE WITHOUT CONTRAST TECHNIQUE: Multidetector CT imaging of the head and cervical spine was performed following the standard protocol without intravenous contrast. Multiplanar CT image reconstructions of the cervical spine were also generated. RADIATION DOSE REDUCTION: This exam was performed according to the departmental dose-optimization program which includes automated exposure control, adjustment of the mA and/or kV according to patient size and/or use of iterative  reconstruction technique. COMPARISON:  09/30/2023 FINDINGS: CT HEAD FINDINGS Brain: Multifocal acute subarachnoid hemorrhage over both hemispheres, greatest at the right sylvian fissure. The amount of hemorrhage is worsened since the prior examination. No midline shift or other mass effect. Confluent white matter hypoattenuation. Mild volume loss. Vascular: No hyperdense vessel or unexpected calcification. Skull: No fracture. Sinuses/Orbits: No acute finding. Other: None. CT CERVICAL SPINE FINDINGS Alignment: Normal. Skull base and vertebrae: There is widespread osseous metastatic disease affecting the ribs, cervical spine and lower skull base. Unchanged appearance of pathologic fractures at T1 and C6. No new fracture. Soft tissues and spinal canal: Hyperdense foci within the left subarticular zone at the C1-2 level. Disc levels:  No high-grade spinal canal stenosis. Upper chest: 6 mm right apical pulmonary nodule. Incompletely visualized left pleural effusion. Other: None IMPRESSION: 1. Multifocal acute subarachnoid hemorrhage over both hemispheres, greatest at the right Sylvian fissure. The amount of hemorrhage is worsened since the prior examination. No midline shift or other mass effect. 2. Widespread osseous metastatic disease affecting the ribs, cervical spine and lower skull base. Unchanged appearance of pathologic fractures at T1 and C6. No new fracture. 3. Unchanged hyperdense foci within the left subarticular zone at the C1-2 level, favored to be a meningioma. 4. Incompletely visualized left pleural effusion. 5. Unchanged 6 mm right apical pulmonary nodule. Electronically Signed: By: Juanetta Nordmann M.D. On: 10/02/2023 20:27    LOS: 0 days    Total time spent in review of labs and imaging, patient evaluation, formulation of plan, documentation and communication with patient/family: 50 minutes  Lesa Rape, MD [facility] 10/03/2023, 1:27 PM

## 2023-10-03 NOTE — Progress Notes (Signed)
 Palliative Medicine [facility]  [phone] [phone]   Name: Jonathan Page Date: 10/03/2023 MRN: [id number]  DOB: August 14, 1950  Patient Care Team: [name], NP as PCP - General (Internal Medicine) Rosalita Combe, RN as Oncology Nurse Navigator Pa, [facility] (Optometry) Pickenpack-Cousar, Giles Labrum, NP as Nurse Practitioner [facility] and Palliative Medicine)    INTERVAL HISTORY: Jonathan Page is a 73 y.o. male with oncologic medical history including non-small cell lung cancer (06/2023) with metastatic disease to spine and widespread bone. As well as a history of diabetes, hypertension, and seasonal allergies and a 45lb weight loss in the last 3-4 months. Palliative ask to see for symptom management and goals of care.   SOCIAL HISTORY:     reports that he quit smoking about 4 years ago. His smoking use included cigarettes and cigars. He started smoking about 35 years ago. He has a 15 pack-year smoking history. He has never used smokeless tobacco. He reports current alcohol use of about 6.0 standard drinks of alcohol per week. He reports that he does not use drugs.  ADVANCE DIRECTIVES:  None on file   CODE STATUS: Full code  PAST MEDICAL HISTORY: Past Medical History:  Diagnosis Date   Allergy    Cancer (HCC)    Cellulitis 02/21/2018   Concussion syndrome    Diabetes (HCC)    Frequent headaches    Hypertension     ALLERGIES:  has no known allergies.  MEDICATIONS:  No current facility-administered medications for this visit.   No current outpatient medications on file.   Facility-Administered Medications Ordered in Other Visits  Medication Dose Route Frequency Provider Last Rate Last Admin   acetaminophen  (TYLENOL ) tablet 650 mg  650 mg Oral Q6H PRN Myrl Askew, MD   650 mg at 10/02/23 2343   Or   acetaminophen  (TYLENOL ) suppository 650 mg  650 mg Rectal Q6H PRN Myrl Askew, MD       Chlorhexidine   Gluconate Cloth 2 % PADS 6 each  6 each Topical Daily Kc, Ramesh, MD       feeding supplement (ENSURE ENLIVE / ENSURE PLUS) liquid 237 mL  237 mL Oral BID BM Kc, Ramesh, MD   237 mL at 10/03/23 1536   gabapentin  (NEURONTIN ) capsule 300 mg  300 mg Oral QHS Myrl Askew, MD   300 mg at 10/02/23 2343   insulin  aspart (novoLOG ) injection 0-9 Units  0-9 Units Subcutaneous TID WC Kc, Lurlene Salon, MD   2 Units at 10/03/23 1104   lisinopril  (ZESTRIL ) tablet 10 mg  10 mg Oral Daily Dorrell, Porfirio Bristol, MD   10 mg at 10/03/23 1041   ondansetron  (ZOFRAN ) tablet 4 mg  4 mg Oral Q6H PRN Myrl Askew, MD       Or   ondansetron  (ZOFRAN ) injection 4 mg  4 mg Intravenous Q6H PRN Dorrell, Robert, MD       oxyCODONE  (Oxy IR/ROXICODONE ) immediate release tablet 5 mg  5 mg Oral Q4H PRN Myrl Askew, MD   5 mg at 10/02/23 2343   sodium chloride  flush (NS) 0.9 % injection 10-40 mL  10-40 mL Intracatheter Q12H Kc, Ramesh, MD       sodium chloride  flush (NS) 0.9 % injection 10-40 mL  10-40 mL Intracatheter PRN Kc, Ramesh, MD        VITAL SIGNS: BP (!) 89/63 (BP Location: Right Arm, Patient Position: Sitting)   Pulse (!) 117 Comment: pa is aware  Temp  98.3 F (36.8 C) (Temporal)   Resp 18   Ht 6\' 1"  (1.854 m)   SpO2 97%   BMI 19.13 kg/m  Filed Weights    Estimated body mass index is 19.13 kg/m as calculated from the following:   Height as of this encounter: 6\' 1"  (1.854 m).   Weight as of 09/30/23: 145 lb (65.8 kg).   PERFORMANCE STATUS (ECOG) : 1 - Symptomatic but completely ambulatory   Physical Exam General: NAD Cardiovascular: regular rate and rhythm Pulmonary: normal breathing pattern Extremities: no edema, no joint deformities Skin: no rashes Neurological: AAO x3  IMPRESSION: Discussed the use of AI scribe software for clinical note transcription with the patient, who gave verbal consent to proceed.  History of Present Illness Jonathan Page is a 73 year old male who presents to clinic for  follow-up.  He is found to be  hypotensive during our visit. Patient is s/p a recent fall with head injury.  He was evaluated in the ED. CT of head showed a small right subarachnoid hemorrhage however patient was deemed stable enough to discharge home and subsequently discharged home with strict parameters to return.   Wife expresses concerns that patient has not eaten much since the fall and appears weak.  He is requiring assistance more than usual to ambulate and perform ADLs.  He recently received a blood transfusion, and lab work was planned for today, but the focus is on addressing the low blood pressure and recent fall getting significant change in baseline.  He has been experiencing low blood pressure readings of 89/63 and 87/54. He denies lightheadedness or dizziness. He has not taken his blood pressure medication, due to these low readings. No headaches or changes in vision.   I discussed with patient and wife concerns for sudden change which warrants further ED work-up. They verbalized understanding. Patient was transported to ED after report called to [place], [place]. Eventful transport and patient was assisted onto stretcher. Nurse at the bedside and report given.   I discussed the importance of continued conversation with family and their medical providers regarding overall plan of care and treatment options, ensuring decisions are within the context of the patients values and GOCs. Assessment & Plan Acute subarachnoid hemorrhage Acute subarachnoid hemorrhage on CT with no worsening or midline shift. Current hypotension necessitates re-evaluation for delayed complications. - Transport to the emergency department for further evaluation and workup. - ED report given to Elisabeth Guild, Forensic scientist.  Hypotension Hypotension with readings of 89/63 and 87/64. Atypical low blood pressure may indicate delayed response to head injury. - Transport to the emergency department for further evaluation and  workup.  Weakness Increased weakness since fall, possibly related to hypotension. - Transport to the emergency department for further evaluation and workup.  Patient expressed understanding and was in agreement with this plan. He also understands that He can call the clinic at any time with any questions, concerns, or complaints.   Any controlled substances utilized were prescribed in the context of palliative care. PDMP has been reviewed.    Visit consisted of counseling and education dealing with the complex and emotionally intense issues of symptom management and palliative care in the setting of serious and potentially life-threatening illness.  Dellia Ferguson, AGPCNP-BC  Palliative Medicine Team/[facility]  Used to

## 2023-10-03 NOTE — Hospital Course (Addendum)
 PROGRESS NOTE KOKI BUXTON  ZOX:[id number] DOB: 1951/03/03 DOA: 10/02/2023 PCP: [name], NP   Brief Narrative/Hospital Course:   67 yomw/history significant for metastatic NSC Lung CA bundle of CKD IIIa b/l creat 1.3,T2DM who presented ED due to weakness. Patient is on chemotherapy for his lung cancer and 2 days  PTA had a fall, was found to have a SAH in CT 09/30/22 and was discharged but presented again on 4/21 for recurrent fall and hypotension. In the ED afebrile hemodynamics fairly stable labs reviewed leukocytosis creatinine 1 Hb 7.8 CT head>>showed multifocal subarachnoid hemorrhage worse from prior exam. CT spine>>no fracture. Neurosurgery was consulted in the ED and recommended repeat imaging and q4hr neurochecks and was admitted> repeat CT unchanged SAH.  Subjective: Seen and examined Complains of being weak frail Difficult to mobilize due to left HIP pain issues Overnight some left hip pain, otherwise vital stable no new events  Assessment/Plan:  Traumatic SAH Mechanical falls Left hip pain recent lt hip IM hip screw 07/25/23 : Patient with fall and imaging showing SAH.  Repeat CT 4/22 AM stable- per neurosurgery no further intervention needed. Continue PT evaluation obtain PT OT eval, anticipated SNF placement due to recurrent fall.  Neurologically intact.Monitor neurochecks Patient appears weak due to symptomatic anemia also having left hip pain from his recent hip surgery-is deconditioned and weak.  Hypertension: Well-controlled on lisinopril    CAD: No chest pain.  Will hold aspirin  in the setting of SAH until cleared by neurosurgery  Metastatic non-small cell lung cancer: Patient on chemo followed by outpatient oncology-will need to keep up with appointment  Leukocytosis: Likely reactive. No fever.  Resolved  Chronic anemia-symptoms did Thrombocytopenia: Likely related to chemotherapy-continue to monitor, transfusing 1 u PRBC as he is symptomatic and  hemoglobin around 7.1 g, recently had blood transfusion for hemoglobin 6.6  on  4/19 per patient Recent Labs  Lab 09/28/23 0833 09/30/23 1138 10/02/23 1620 10/03/23 0615 10/04/23 0307  HGB 8.9* 6.6* 7.8* 7.5* 7.1*  HCT 27.8* 21.4* 25.0* 23.8* 22.8*    T2DM: Blood sugar poorly controlled cont ss Recent Labs  Lab 10/03/23 0854 10/03/23 1632 10/03/23 2037 10/04/23 0719  GLUCAP 153* 202* 341* 173*   DVT prophylaxis: SCDs Start: 10/02/23 2201 Code Status:   Code Status: Full Code Family Communication: plan of care discussed with patient at bedside. Patient status is: Remains hospitalized because of severity of illness-Patient appears weak due to symptomatic anemia also having left hip pain from his recent hip surgery-is deconditioned and weak-transfusing 1 unit PRBC, anticipating skilled nursing facility. Level of care: Telemetry    Dispo: The patient is from: home            Anticipated disposition: SNF 1-2 days  Objective: Vitals last 24 hrs: Vitals:   10/03/23 1357 10/03/23 1748 10/03/23 2201 10/04/23 0129  BP: 92/66 106/67 104/71 99/64  Pulse: (!) 105 (!) 108 (!) 103 94  Resp: 18  15 15   Temp: 97.7 F (36.5 C) 98.4 F (36.9 C) 98.8 F (37.1 C) 98.3 F (36.8 C)  TempSrc: Oral     SpO2: 96% 98% 98% 100%  Weight:      Height:        Physical Examination: General exam: alert awake, older than stated age HEENT:Oral mucosa moist, Ear/Nose WNL grossly Respiratory system: Bilaterally diminished BS, no use of accessory muscle Cardiovascular system: S1 & S2 +. Gastrointestinal system: Abdomen soft, NT,ND,BS+ Nervous System: Alert, awake,following commands. Extremities: LE edema neg, moving arms  but wil painful mobility on the left leg able to move upper extremities lower extremities sensation intact Skin: No rashes,warm. MSK: Normal muscle bulk/tone.   Data Reviewed: I have personally reviewed following labs and imaging studies ( see epic result tab) CBC: Recent Labs   Lab 09/28/23 0833 09/30/23 1138 10/02/23 1620 10/03/23 0615 10/04/23 0307  WBC 15.6* 7.9 17.8* 12.4* 8.1  NEUTROABS 13.5* 7.5 14.0*  --   --   HGB 8.9* 6.6* 7.8* 7.5* 7.1*  HCT 27.8* 21.4* 25.0* 23.8* 22.8*  MCV 105.7* 111.5* 107.3* 107.7* 107.5*  PLT 168 120* 121* 122* 119*   CMP: Recent Labs  Lab 09/28/23 0833 09/30/23 1138 10/02/23 1620 10/04/23 0307  NA 137 135 135 137  K 3.9 4.7 4.2 4.0  CL 102 105 104 107  CO2 24 20* 21* 22  GLUCOSE 431* 405* 234* 167*  BUN 25* 34* 33* 26*  CREATININE 1.46* 0.90 1.04 0.97  CALCIUM  10.3 8.7* 9.2 9.1   GFR: Estimated Creatinine Clearance: 61.8 mL/min (by C-G formula based on SCr of 0.97 mg/dL). Recent Labs  Lab 09/28/23 0833 10/02/23 1620  AST 10* 16  ALT 7 10  ALKPHOS 326* 243*  BILITOT 0.6 1.2  PROT 7.1 6.1*  ALBUMIN 4.1 3.3*   No results for input(s): "LIPASE", "AMYLASE" in the last 168 hours. No results for input(s): "AMMONIA" in the last 168 hours. Coagulation Profile:  Recent Labs  Lab 09/30/23 1138 10/02/23 1620  INR 1.0 1.1    Medications reviewed:  Scheduled Meds:  Chlorhexidine  Gluconate Cloth  6 each Topical Daily   feeding supplement  237 mL Oral BID BM   gabapentin   300 mg Oral QHS   insulin  aspart  0-9 Units Subcutaneous TID WC   lisinopril   10 mg Oral Daily   sodium chloride  flush  10-40 mL Intracatheter Q12H   Continuous Infusions: Total time spent in review of labs and imaging, patient evaluation, formulation of plan, documentation and communication with patient/family: 50 minutes  Lesa Rape, MD [facility] 10/04/2023, 8:58 AM

## 2023-10-04 DIAGNOSIS — Z7982 Long term (current) use of aspirin: Secondary | ICD-10-CM | POA: Diagnosis not present

## 2023-10-04 DIAGNOSIS — R54 Age-related physical debility: Secondary | ICD-10-CM | POA: Diagnosis present

## 2023-10-04 DIAGNOSIS — S066XAA Traumatic subarachnoid hemorrhage with loss of consciousness status unknown, initial encounter: Secondary | ICD-10-CM | POA: Diagnosis present

## 2023-10-04 DIAGNOSIS — Z833 Family history of diabetes mellitus: Secondary | ICD-10-CM | POA: Diagnosis not present

## 2023-10-04 DIAGNOSIS — C799 Secondary malignant neoplasm of unspecified site: Secondary | ICD-10-CM | POA: Diagnosis present

## 2023-10-04 DIAGNOSIS — T451X5A Adverse effect of antineoplastic and immunosuppressive drugs, initial encounter: Secondary | ICD-10-CM | POA: Diagnosis present

## 2023-10-04 DIAGNOSIS — C3492 Malignant neoplasm of unspecified part of left bronchus or lung: Secondary | ICD-10-CM | POA: Diagnosis present

## 2023-10-04 DIAGNOSIS — I2510 Atherosclerotic heart disease of native coronary artery without angina pectoris: Secondary | ICD-10-CM | POA: Diagnosis present

## 2023-10-04 DIAGNOSIS — Z79899 Other long term (current) drug therapy: Secondary | ICD-10-CM | POA: Diagnosis not present

## 2023-10-04 DIAGNOSIS — N1831 Chronic kidney disease, stage 3a: Secondary | ICD-10-CM | POA: Diagnosis present

## 2023-10-04 DIAGNOSIS — I609 Nontraumatic subarachnoid hemorrhage, unspecified: Secondary | ICD-10-CM | POA: Diagnosis present

## 2023-10-04 DIAGNOSIS — I129 Hypertensive chronic kidney disease with stage 1 through stage 4 chronic kidney disease, or unspecified chronic kidney disease: Secondary | ICD-10-CM | POA: Diagnosis present

## 2023-10-04 DIAGNOSIS — D6481 Anemia due to antineoplastic chemotherapy: Secondary | ICD-10-CM | POA: Diagnosis present

## 2023-10-04 DIAGNOSIS — D6959 Other secondary thrombocytopenia: Secondary | ICD-10-CM | POA: Diagnosis present

## 2023-10-04 DIAGNOSIS — D72829 Elevated white blood cell count, unspecified: Secondary | ICD-10-CM | POA: Diagnosis present

## 2023-10-04 DIAGNOSIS — E1122 Type 2 diabetes mellitus with diabetic chronic kidney disease: Secondary | ICD-10-CM | POA: Diagnosis present

## 2023-10-04 DIAGNOSIS — Z87891 Personal history of nicotine dependence: Secondary | ICD-10-CM | POA: Diagnosis not present

## 2023-10-04 DIAGNOSIS — Z794 Long term (current) use of insulin: Secondary | ICD-10-CM | POA: Diagnosis not present

## 2023-10-04 DIAGNOSIS — W1830XA Fall on same level, unspecified, initial encounter: Secondary | ICD-10-CM | POA: Diagnosis present

## 2023-10-04 DIAGNOSIS — R296 Repeated falls: Secondary | ICD-10-CM | POA: Diagnosis present

## 2023-10-04 DIAGNOSIS — Z7984 Long term (current) use of oral hypoglycemic drugs: Secondary | ICD-10-CM | POA: Diagnosis not present

## 2023-10-04 LAB — CBC
HCT: 22.8 % — ABNORMAL LOW (ref 39.0–52.0)
Hemoglobin: 7.1 g/dL — ABNORMAL LOW (ref 13.0–17.0)
MCH: 33.5 pg (ref 26.0–34.0)
MCHC: 31.1 g/dL (ref 30.0–36.0)
MCV: 107.5 fL — ABNORMAL HIGH (ref 80.0–100.0)
Platelets: 119 10*3/uL — ABNORMAL LOW (ref 150–400)
RBC: 2.12 MIL/uL — ABNORMAL LOW (ref 4.22–5.81)
RDW: 20.8 % — ABNORMAL HIGH (ref 11.5–15.5)
WBC: 8.1 10*3/uL (ref 4.0–10.5)
nRBC: 0.2 % (ref 0.0–0.2)

## 2023-10-04 LAB — BASIC METABOLIC PANEL WITH GFR
Anion gap: 8 (ref 5–15)
BUN: 26 mg/dL — ABNORMAL HIGH (ref 8–23)
CO2: 22 mmol/L (ref 22–32)
Calcium: 9.1 mg/dL (ref 8.9–10.3)
Chloride: 107 mmol/L (ref 98–111)
Creatinine, Ser: 0.97 mg/dL (ref 0.61–1.24)
GFR, Estimated: >60 mL/min (ref >60)
Glucose, Bld: 167 mg/dL — ABNORMAL HIGH (ref 70–99)
Potassium: 4 mmol/L (ref 3.5–5.1)
Sodium: 137 mmol/L (ref 135–145)

## 2023-10-04 LAB — PREPARE RBC (CROSSMATCH)

## 2023-10-04 LAB — GLUCOSE, CAPILLARY
Glucose-Capillary: 173 mg/dL — ABNORMAL HIGH (ref 70–99)
Glucose-Capillary: 236 mg/dL — ABNORMAL HIGH (ref 70–99)
Glucose-Capillary: 148 mg/dL — ABNORMAL HIGH (ref 70–99)
Glucose-Capillary: 346 mg/dL — ABNORMAL HIGH (ref 70–99)

## 2023-10-04 MED ORDER — POLYETHYLENE GLYCOL 3350 17 G PO PACK
17.0000 g | PACK | Freq: Every day | ORAL | Status: DC | PRN
Start: 2023-10-04 — End: 2023-10-06

## 2023-10-04 MED ORDER — SODIUM CHLORIDE 0.9% IV SOLUTION: Freq: Once | INTRAVENOUS | Status: AC

## 2023-10-04 MED ORDER — INSULIN ASPART 100 UNIT/ML IJ SOLN
4.0000 [IU] | Freq: Once | INTRAMUSCULAR | Status: AC
  Administered 2023-10-04: 4 [IU] via SUBCUTANEOUS

## 2023-10-04 MED ORDER — SENNOSIDES-DOCUSATE SODIUM 8.6-50 MG PO TABS
1.0000 | ORAL_TABLET | Freq: Two times a day (BID) | ORAL | Status: DC
  Administered 2023-10-04 – 2023-10-06 (×3): 1 via ORAL
  Filled 2023-10-04 (×4): qty 1

## 2023-10-04 NOTE — Evaluation (Signed)
 Physical Therapy Evaluation Patient Details Name: Jonathan Page MRN: [id number] DOB: 05-Sep-1950 Today's Date: 10/04/2023  History of Present Illness  73 y.o. male who is on chemotherapy for his lung cancer. On  09/30/23 he had a fall, presented to ED, found to have a subarachnoid hemorrhage and was discharged.  He presented 10/02/23 with recurrent fall and hypotension.  Patient underwent CT head which showed multifocal subarachnoid hemorrhage worse from prior exam. Past medical history significant of metastatic non-small cell lung cancer, CKD, type 2 diabetes, 07/25/23 ORIF for pathologic fx of L femur.  Clinical Impression  Pt admitted with above diagnosis. Pt c/o 10/10 pain L hip with mobility, pain medication requested. Mod assist supine to sit, increased time 2* pain. Mod assist to stand and assist needed to advance LLE to take 3 pivotal steps to recliner with RW. Pt fatigued quickly. Pt reports he had been receiving HHPT and walking well at home following ORIF of femur 07/25/23, but since his recent falls hasn't been ambulatory. He is not mobilizing well enough to DC home.  Patient will benefit from continued inpatient follow up therapy, <3 hours/day.  Pt currently with functional limitations due to the deficits listed below (see PT Problem List). Pt will benefit from acute skilled PT to increase their independence and safety with mobility to allow discharge.           If plan is discharge home, recommend the following: A lot of help with walking and/or transfers;A lot of help with bathing/dressing/bathroom;Assistance with cooking/housework;Assist for transportation;Help with stairs or ramp for entrance   Can travel by private vehicle   No    Equipment Recommendations Wheelchair (measurements PT);Wheelchair cushion (measurements PT)  Recommendations for Other Services       Functional Status Assessment Patient has had a recent decline in their functional status and demonstrates the ability  to make significant improvements in function in a reasonable and predictable amount of time.     Precautions / Restrictions Precautions Precautions: Fall Recall of Precautions/Restrictions: Intact Precaution/Restrictions Comments: multiple falls PTA Restrictions Weight Bearing Restrictions Per Provider Order: No Other Position/Activity Restrictions: WBAT per PT note 07/26/23      Mobility  Bed Mobility Overal bed mobility: Needs Assistance Bed Mobility: Supine to Sit     Supine to sit: Mod assist, HOB elevated, Used rails     General bed mobility comments: HOB elevated ~50*, used rails, used theraband from home to advance LLE with increased time 2* pain, assist to raise trunk and pivot hips to EOB    Transfers Overall transfer level: Needs assistance Equipment used: Rolling walker (2 wheels) Transfers: Sit to/from Stand, Bed to chair/wheelchair/BSC Sit to Stand: Mod assist, From elevated surface   Step pivot transfers: Mod assist       General transfer comment: assist to power up, VCs hand placment, assist to advance LLE for several pivotal steps bed to recliner; assist to control descent to recliner, Increased time for all mobility    Ambulation/Gait               General Gait Details: NT 2* pain and fatigue with transfer  Stairs            Wheelchair Mobility     Tilt Bed    Modified Rankin (Stroke Patients Only)       Balance Overall balance assessment: Needs assistance, History of Falls Sitting-balance support: No upper extremity supported, Feet supported Sitting balance-Leahy Scale: Fair     Standing balance support:  Bilateral upper extremity supported, During functional activity, Reliant on assistive device for balance Standing balance-Leahy Scale: Poor                               Pertinent Vitals/Pain Pain Assessment Pain Assessment: 0-10 Pain Score: 10-Worst pain ever Pain Location: L hip with movement Pain  Descriptors / Indicators: Grimacing, Guarding Pain Intervention(s): Limited activity within patient's tolerance, Monitored during session, Patient requesting pain meds-RN notified, Repositioned    Home Living Family/patient expects to be discharged to:: Private residence Living Arrangements: Spouse/significant other Available Help at Discharge: Family;Available PRN/intermittently Type of Home: House Home Access: Stairs to enter Entrance Stairs-Rails: None Entrance Stairs-Number of Steps: 1   Home Layout: One level Home Equipment: Cane - quad;Shower seat;Grab bars - tub/shower;Rolling Environmental consultant (2 wheels) Additional Comments: lives with girlfriend    Prior Function Prior Level of Function : History of Falls (last six months);Needs assist             Mobility Comments: Prior to recent falls he was walking with a RW or cane ADLs Comments: assist needed     Extremity/Trunk Assessment        Lower Extremity Assessment Lower Extremity Assessment: LLE deficits/detail LLE Deficits / Details: L hip painful with minimal movement, pt used theraband from home to advance LLE to EOB, required assist to advance LLE to take a step LLE: Unable to fully assess due to pain LLE Sensation: WNL LLE Coordination: WNL    Cervical / Trunk Assessment Cervical / Trunk Assessment: Normal  Communication   Communication Communication: No apparent difficulties    Cognition Arousal: Alert Behavior During Therapy: WFL for tasks assessed/performed   PT - Cognitive impairments: No apparent impairments                         Following commands: Intact       Cueing Cueing Techniques: Verbal cues, Tactile cues     General Comments      Exercises     Assessment/Plan    PT Assessment Patient needs continued PT services  PT Problem List Decreased mobility;Decreased activity tolerance;Decreased balance;Pain       PT Treatment Interventions Therapeutic exercise;Therapeutic  activities;Patient/family education;DME instruction;Functional mobility training;Balance training    PT Goals (Current goals can be found in the Care Plan section)  Acute Rehab PT Goals Patient Stated Goal: to walk PT Goal Formulation: With patient Time For Goal Achievement: 10/18/23 Potential to Achieve Goals: Fair    Frequency Min 3X/week     Co-evaluation               AM-PAC PT "6 Clicks" Mobility  Outcome Measure Help needed turning from your back to your side while in a flat bed without using bedrails?: A Lot Help needed moving from lying on your back to sitting on the side of a flat bed without using bedrails?: A Lot Help needed moving to and from a bed to a chair (including a wheelchair)?: A Lot Help needed standing up from a chair using your arms (e.g., wheelchair or bedside chair)?: A Lot Help needed to walk in hospital room?: Total Help needed climbing 3-5 steps with a railing? : Total 6 Click Score: 10    End of Session Equipment Utilized During Treatment: Gait belt Activity Tolerance: Patient limited by pain;Patient limited by fatigue Patient left: in chair;with chair alarm set;with call bell/phone within reach  Nurse Communication: Mobility status;Need for lift equipment (Stedy recommended for transfers) PT Visit Diagnosis: Pain;Repeated falls (R29.6);Difficulty in walking, not elsewhere classified (R26.2);Muscle weakness (generalized) (M62.81) Pain - Right/Left: Left Pain - part of body: Hip    Time: 1610-9604 PT Time Calculation (min) (ACUTE ONLY): 25 min   Charges:   PT Evaluation $PT Eval Moderate Complexity: 1 Mod PT Treatments $Therapeutic Activity: 8-22 mins PT General Charges $$ ACUTE PT VISIT: 1 Visit         Daymon Evans PT 10/04/2023  Acute Rehabilitation Services  Office [phone]

## 2023-10-04 NOTE — Care Management Obs Status (Signed)
 MEDICARE OBSERVATION STATUS NOTIFICATION   Patient Details  Name: Jonathan Page MRN: [id number] Date of Birth: July 08, 1950   Medicare Observation Status Notification Given:  Yes    Marty Sleet, LCSW 10/04/2023, 10:06 AM

## 2023-10-04 NOTE — TOC Initial Note (Signed)
 Transition of Care [facility]) - Initial/Assessment Note    Patient Details  Name: Jonathan Page MRN: [id number] Date of Birth: Feb 27, 1951  Transition of Care [facility]) CM/SW Contact:    Jonni Nettle, LCSW Phone Number: 10/04/2023, 11:46 AM  Clinical Narrative:                 CSW met with pt to discuss SNF ST Rehab recommendation from PT. Pt reports no past history of SNF. Pt is agreeable to SNF placement upon discharge. CSW sent referrals to SNF in [place]. CSW will await follow up from SNF.    Expected Discharge Plan: Skilled Nursing Facility Barriers to Discharge: Continued Medical Work up, SNF Pending bed offer   Patient Goals and CMS Choice Patient states their goals for this hospitalization and ongoing recovery are:: To go to SNF after discharge CMS Medicare.gov Compare Post Acute Care list provided to:: Patient Choice offered to / list presented to : Patient      Expected Discharge Plan and Services In-house Referral: Clinical Social Work Discharge Planning Services: Other - See comment (SNF) Post Acute Care Choice: Skilled Nursing Facility Living arrangements for the past 2 months: Single Family Home                 DME Arranged: N/A DME Agency: NA       HH Arranged: NA HH Agency: NA        Prior Living Arrangements/Services Living arrangements for the past 2 months: Single Family Home Lives with:: Self Patient language and need for interpreter reviewed:: Yes        Need for Family Participation in Patient Care: No (Comment) Care giver support system in place?: No (comment)   Criminal Activity/Legal Involvement Pertinent to Current Situation/Hospitalization: No - Comment as needed  Activities of Daily Living   ADL Screening (condition at time of admission) Independently performs ADLs?: Yes (appropriate for developmental age) Is the patient deaf or have difficulty hearing?: No Does the patient have difficulty seeing, even when wearing  glasses/contacts?: No Does the patient have difficulty concentrating, remembering, or making decisions?: No  Permission Sought/Granted Permission sought to share information with : Facility Industrial/product designer granted to share information with : Yes, Verbal Permission Granted  Emotional Assessment Appearance:: Appears stated age, Developmentally appropriate Attitude/Demeanor/Rapport: Engaged Affect (typically observed): Calm, Appropriate, Stable Orientation: : Oriented to Self, Oriented to Place, Oriented to  Time, Oriented to Situation Alcohol  / Substance Use: Not Applicable Psych Involvement: No (comment)  Admission diagnosis:  Subarachnoid hemorrhage (HCC) [I60.9] SAH (subarachnoid hemorrhage) (HCC) [I60.9] Malignant neoplasm of lung, unspecified laterality, unspecified part of lung (HCC) [C34.90] Patient Active Problem List   Diagnosis Date Noted   Subarachnoid hemorrhage (HCC) 10/02/2023   Iron  deficiency anemia 08/08/2023   Anemia 08/01/2023   Thrombocytopenia (HCC) 08/01/2023   On antineoplastic chemotherapy 08/01/2023   Uncontrolled type 2 diabetes mellitus with hyperglycemia, without long-term current use of insulin  (HCC) 08/01/2023   Chronic dyspnea 08/01/2023   Leg edema, right 08/01/2023   S/P total left hip intramedullary nail 07/25/23 08/01/2023   Status post hip surgery 07/25/2023   Port-A-Cath in place 07/17/2023   Encounter for antineoplastic chemotherapy 07/03/2023   Encounter for antineoplastic immunotherapy 07/03/2023   Non-small cell lung cancer metastatic to bone (HCC) 06/22/2023   Lung mass 06/02/2023   Diabetes mellitus treated with oral medication (HCC) 05/29/2023   Restless legs syndrome (RLS) 05/29/2023   Chest pain 01/20/2023   Left leg pain  10/28/2022   Skin lesion 04/16/2021   Preventative health care 11/08/2017   Syncope and collapse 10/14/2016   Prolonged QT interval 10/14/2016   Hyperlipidemia 01/25/2016   CKD (chronic kidney  disease) stage 3, GFR 30-59 ml/min (HCC) 01/25/2016   Essential hypertension 10/27/2015   Type II diabetes mellitus with nephropathy (HCC) 10/22/2015   PCP:  [name], NP Pharmacy:   [facility] [address], [place] - [address] [place] [address] Phone: [phone] Fax: [phone]  [facility] [phone] - [place], [place] - [address] [place] [address] Phone: [phone] Fax: [phone]  Social Drivers of Health (SDOH) Social History: SDOH Screenings   Food Insecurity: No Food Insecurity (10/03/2023)  Housing: Low Risk  (10/03/2023)  Transportation Needs: No Transportation Needs (10/03/2023)  Utilities: Not At Risk (10/03/2023)  Depression (PHQ2-9): Low Risk  (03/16/2023)  Social Connections: Socially Integrated (07/25/2023)  Tobacco Use: Medium Risk (10/02/2023)   SDOH Interventions:   Readmission Risk Interventions     No data to display

## 2023-10-04 NOTE — Plan of Care (Addendum)
 BG 341 at bedtime. Patient c/o left hip pain, given PRN Tylenol  and Oxycodone . No acute events overnight.   Problem: Fluid Volume: Goal: Ability to maintain a balanced intake and output will improve Outcome: Progressing   Problem: Metabolic: Goal: Ability to maintain appropriate glucose levels will improve Outcome: Progressing   Problem: Nutritional: Goal: Maintenance of adequate nutrition will improve Outcome: Progressing   Problem: Tissue Perfusion: Goal: Adequacy of tissue perfusion will improve Outcome: Progressing   Problem: Education: Goal: Knowledge of General Education information will improve Description: Including pain rating scale, medication(s)/side effects and non-pharmacologic comfort measures Outcome: Progressing   Problem: Health Behavior/Discharge Planning: Goal: Ability to manage health-related needs will improve Outcome: Progressing   Problem: Clinical Measurements: Goal: Ability to maintain clinical measurements within normal limits will improve Outcome: Progressing   Problem: Pain Managment: Goal: General experience of comfort will improve and/or be controlled Outcome: Progressing   Problem: Safety: Goal: Ability to remain free from injury will improve Outcome: Progressing

## 2023-10-04 NOTE — Progress Notes (Signed)
 PROGRESS NOTE Jonathan Page  ZOX:[id number] DOB: 10/05/1950 DOA: 10/02/2023 PCP: [name], NP   Brief Narrative/Hospital Course:   86 yomw/history significant for metastatic NSC Lung CA bundle of CKD IIIa b/l creat 1.3,T2DM who presented ED due to weakness. Patient is on chemotherapy for his lung cancer and 2 days  PTA had a fall, was found to have a SAH in CT 09/30/22 and was discharged but presented again on 4/21 for recurrent fall and hypotension. In the ED afebrile hemodynamics fairly stable labs reviewed leukocytosis creatinine 1 Hb 7.8 CT head>>showed multifocal subarachnoid hemorrhage worse from prior exam. CT spine>>no fracture. Neurosurgery was consulted in the ED and recommended repeat imaging and q4hr neurochecks and was admitted> repeat CT unchanged SAH.  Subjective: Seen and examined Complains of being weak frail Difficult to mobilize due to left HIP pain issues Overnight some left hip pain, otherwise vital stable no new events  Assessment/Plan:  Traumatic SAH Mechanical falls Left hip pain recent lt hip IM hip screw 07/25/23 : Patient with fall and imaging showing SAH.  Repeat CT 4/22 AM stable- per neurosurgery no further intervention needed. Continue PT evaluation obtain PT OT eval, anticipated SNF placement due to recurrent fall.  Neurologically intact.Monitor neurochecks Patient appears weak due to symptomatic anemia also having left hip pain from his recent hip surgery-is deconditioned and weak.  Hypertension: Well-controlled on lisinopril    CAD: No chest pain.  Will hold aspirin  in the setting of SAH until cleared by neurosurgery  Metastatic non-small cell lung cancer: Patient on chemo followed by outpatient oncology-will need to keep up with appointment  Leukocytosis: Likely reactive. No fever.  Resolved  Chronic anemia-symptoms did Thrombocytopenia: Likely related to chemotherapy-continue to monitor, transfusing 1 u PRBC as he is symptomatic and  hemoglobin around 7.1 g, recently had blood transfusion for hemoglobin 6.6  on  4/19 per patient Recent Labs  Lab 09/28/23 0833 09/30/23 1138 10/02/23 1620 10/03/23 0615 10/04/23 0307  HGB 8.9* 6.6* 7.8* 7.5* 7.1*  HCT 27.8* 21.4* 25.0* 23.8* 22.8*    T2DM: Blood sugar poorly controlled cont ss Recent Labs  Lab 10/03/23 0854 10/03/23 1632 10/03/23 2037 10/04/23 0719  GLUCAP 153* 202* 341* 173*   DVT prophylaxis: SCDs Start: 10/02/23 2201 Code Status:   Code Status: Full Code Family Communication: plan of care discussed with patient at bedside. Patient status is: Remains hospitalized because of severity of illness-Patient appears weak due to symptomatic anemia also having left hip pain from his recent hip surgery-is deconditioned and weak-transfusing 1 unit PRBC, anticipating skilled nursing facility. Level of care: Telemetry    Dispo: The patient is from: home            Anticipated disposition: SNF 1-2 days  Objective: Vitals last 24 hrs: Vitals:   10/03/23 1357 10/03/23 1748 10/03/23 2201 10/04/23 0129  BP: 92/66 106/67 104/71 99/64  Pulse: (!) 105 (!) 108 (!) 103 94  Resp: 18  15 15   Temp: 97.7 F (36.5 C) 98.4 F (36.9 C) 98.8 F (37.1 C) 98.3 F (36.8 C)  TempSrc: Oral     SpO2: 96% 98% 98% 100%  Weight:      Height:        Physical Examination: General exam: alert awake, older than stated age HEENT:Oral mucosa moist, Ear/Nose WNL grossly Respiratory system: Bilaterally diminished BS, no use of accessory muscle Cardiovascular system: S1 & S2 +. Gastrointestinal system: Abdomen soft, NT,ND,BS+ Nervous System: Alert, awake,following commands. Extremities: LE edema neg, moving arms  but wil painful mobility on the left leg able to move upper extremities lower extremities sensation intact Skin: No rashes,warm. MSK: Normal muscle bulk/tone.   Data Reviewed: I have personally reviewed following labs and imaging studies ( see epic result tab) CBC: Recent Labs   Lab 09/28/23 0833 09/30/23 1138 10/02/23 1620 10/03/23 0615 10/04/23 0307  WBC 15.6* 7.9 17.8* 12.4* 8.1  NEUTROABS 13.5* 7.5 14.0*  --   --   HGB 8.9* 6.6* 7.8* 7.5* 7.1*  HCT 27.8* 21.4* 25.0* 23.8* 22.8*  MCV 105.7* 111.5* 107.3* 107.7* 107.5*  PLT 168 120* 121* 122* 119*   CMP: Recent Labs  Lab 09/28/23 0833 09/30/23 1138 10/02/23 1620 10/04/23 0307  NA 137 135 135 137  K 3.9 4.7 4.2 4.0  CL 102 105 104 107  CO2 24 20* 21* 22  GLUCOSE 431* 405* 234* 167*  BUN 25* 34* 33* 26*  CREATININE 1.46* 0.90 1.04 0.97  CALCIUM  10.3 8.7* 9.2 9.1   GFR: Estimated Creatinine Clearance: 61.8 mL/min (by C-G formula based on SCr of 0.97 mg/dL). Recent Labs  Lab 09/28/23 0833 10/02/23 1620  AST 10* 16  ALT 7 10  ALKPHOS 326* 243*  BILITOT 0.6 1.2  PROT 7.1 6.1*  ALBUMIN 4.1 3.3*   No results for input(s): "LIPASE", "AMYLASE" in the last 168 hours. No results for input(s): "AMMONIA" in the last 168 hours. Coagulation Profile:  Recent Labs  Lab 09/30/23 1138 10/02/23 1620  INR 1.0 1.1    Medications reviewed:  Scheduled Meds:  Chlorhexidine  Gluconate Cloth  6 each Topical Daily   feeding supplement  237 mL Oral BID BM   gabapentin   300 mg Oral QHS   insulin  aspart  0-9 Units Subcutaneous TID WC   lisinopril   10 mg Oral Daily   sodium chloride  flush  10-40 mL Intracatheter Q12H   Continuous Infusions: Total time spent in review of labs and imaging, patient evaluation, formulation of plan, documentation and communication with patient/family: 50 minutes  Lesa Rape, MD [facility] 10/04/2023, 8:58 AM

## 2023-10-04 NOTE — NC FL2 (Signed)
 [place]  MEDICAID FL2 LEVEL OF CARE FORM     IDENTIFICATION  Patient Name: Jonathan Page Birthdate: 03/11/1951 Sex: male Admission Date (Current Location): 10/02/2023  [facility] and [place] Number:  Producer, television/film/video and Address:  [facility],  [address] [place] [address]      Provider Number: [id number]  Attending Physician Name and Address:  Lesa Rape, MD  Relative Name and Phone Number:       Current Level of Care: Hospital Recommended Level of Care: Skilled Nursing Facility Prior Approval Number:    Date Approved/Denied:   PASRR Number: [id number]  Discharge Plan: SNF    Current Diagnoses: Patient Active Problem List   Diagnosis Date Noted   Subarachnoid hemorrhage (HCC) 10/02/2023   Iron  deficiency anemia 08/08/2023   Anemia 08/01/2023   Thrombocytopenia (HCC) 08/01/2023   On antineoplastic chemotherapy 08/01/2023   Uncontrolled type 2 diabetes mellitus with hyperglycemia, without long-term current use of insulin  (HCC) 08/01/2023   Chronic dyspnea 08/01/2023   Leg edema, right 08/01/2023   S/P total left hip intramedullary nail 07/25/23 08/01/2023   Status post hip surgery 07/25/2023   Port-A-Cath in place 07/17/2023   Encounter for antineoplastic chemotherapy 07/03/2023   Encounter for antineoplastic immunotherapy 07/03/2023   Non-small cell lung cancer metastatic to bone (HCC) 06/22/2023   Lung mass 06/02/2023   Diabetes mellitus treated with oral medication (HCC) 05/29/2023   Restless legs syndrome (RLS) 05/29/2023   Chest pain 01/20/2023   Left leg pain 10/28/2022   Skin lesion 04/16/2021   Preventative health care 11/08/2017   Syncope and collapse 10/14/2016   Prolonged QT interval 10/14/2016   Hyperlipidemia 01/25/2016   CKD (chronic kidney disease) stage 3, GFR 30-59 ml/min (HCC) 01/25/2016   Essential hypertension 10/27/2015   Type II diabetes mellitus with nephropathy (HCC) 10/22/2015    Orientation RESPIRATION  BLADDER Height & Weight     Self, Time, Situation, Place  Normal Continent Weight: 140 lb (63.5 kg) Height:  6\' 1"  (185.4 cm)  BEHAVIORAL SYMPTOMS/MOOD NEUROLOGICAL BOWEL NUTRITION STATUS      Continent Diet (Regular)  AMBULATORY STATUS COMMUNICATION OF NEEDS Skin   Limited Assist Verbally Normal                       Personal Care Assistance Level of Assistance  Bathing, Feeding, Dressing Bathing Assistance: Limited assistance Feeding assistance: Limited assistance Dressing Assistance: Limited assistance     Functional Limitations Info  Sight, Hearing, Speech Sight Info: Adequate Hearing Info: Impaired Speech Info: Adequate    SPECIAL CARE FACTORS FREQUENCY  PT (By licensed PT), OT (By licensed OT)     PT Frequency: 5x week OT Frequency: 5x week            Contractures Contractures Info: Not present    Additional Factors Info  Code Status, Allergies Code Status Info: FULL Allergies Info: NKA           Current Medications (10/04/2023):  This is the current hospital active medication list Current Facility-Administered Medications  Medication Dose Route Frequency Provider Last Rate Last Admin   0.9 %  sodium chloride  infusion (Manually program via Guardrails IV Fluids)   Intravenous Once Kc, Ramesh, MD       acetaminophen  (TYLENOL ) tablet 650 mg  650 mg Oral Q6H PRN Myrl Askew, MD   650 mg at 10/03/23 2004   Or   acetaminophen  (TYLENOL ) suppository 650 mg  650 mg Rectal Q6H PRN Dorrell,  Porfirio Bristol, MD       Chlorhexidine  Gluconate Cloth 2 % PADS 6 each  6 each Topical Daily Kc, Ramesh, MD   6 each at 10/04/23 0957   feeding supplement (ENSURE ENLIVE / ENSURE PLUS) liquid 237 mL  237 mL Oral BID BM Kc, Ramesh, MD   237 mL at 10/04/23 0957   gabapentin  (NEURONTIN ) capsule 300 mg  300 mg Oral QHS Myrl Askew, MD   300 mg at 10/03/23 2004   insulin  aspart (novoLOG ) injection 0-9 Units  0-9 Units Subcutaneous TID WC Kc, Lurlene Salon, MD   2 Units at 10/04/23 6045    lisinopril  (ZESTRIL ) tablet 10 mg  10 mg Oral Daily Dorrell, Porfirio Bristol, MD   10 mg at 10/04/23 4098   ondansetron  (ZOFRAN ) tablet 4 mg  4 mg Oral Q6H PRN Myrl Askew, MD       Or   ondansetron  (ZOFRAN ) injection 4 mg  4 mg Intravenous Q6H PRN Dorrell, Robert, MD       oxyCODONE  (Oxy IR/ROXICODONE ) immediate release tablet 5 mg  5 mg Oral Q4H PRN Myrl Askew, MD   5 mg at 10/04/23 1191   sodium chloride  flush (NS) 0.9 % injection 10-40 mL  10-40 mL Intracatheter Q12H Kc, Ramesh, MD   10 mL at 10/04/23 0957   sodium chloride  flush (NS) 0.9 % injection 10-40 mL  10-40 mL Intracatheter PRN Lesa Rape, MD         Discharge Medications: Please see discharge summary for a list of discharge medications.  Relevant Imaging Results:  Relevant Lab Results:   Additional Information SSN: [id number]  Melinda Sprawls Amritha Yorke, LCSW

## 2023-10-04 NOTE — Progress Notes (Signed)
 OT Cancellation Note  Patient Details Name: HUCK ASHWORTH MRN: [id number] DOB: 12/18/1950   Cancelled Treatment:    Reason Eval/Treat Not Completed: Medical issues which prohibited therapy Patient is currently getting unit of PRBCs. OT to continue to follow and check back as schedule will allow.  Wynette Heckler, MS Acute Rehabilitation Department Office# [phone]  10/04/2023, 2:15 PM

## 2023-10-05 ENCOUNTER — Inpatient Hospital Stay (HOSPITAL_COMMUNITY)

## 2023-10-05 DIAGNOSIS — I609 Nontraumatic subarachnoid hemorrhage, unspecified: Secondary | ICD-10-CM | POA: Diagnosis not present

## 2023-10-05 LAB — CBC
HCT: 26.9 % — ABNORMAL LOW (ref 39.0–52.0)
Hemoglobin: 8.4 g/dL — ABNORMAL LOW (ref 13.0–17.0)
MCH: 32.6 pg (ref 26.0–34.0)
MCHC: 31.2 g/dL (ref 30.0–36.0)
MCV: 104.3 fL — ABNORMAL HIGH (ref 80.0–100.0)
Platelets: 134 10*3/uL — ABNORMAL LOW (ref 150–400)
RBC: 2.58 MIL/uL — ABNORMAL LOW (ref 4.22–5.81)
RDW: 22.2 % — ABNORMAL HIGH (ref 11.5–15.5)
WBC: 9.2 10*3/uL (ref 4.0–10.5)
nRBC: 1.3 % — ABNORMAL HIGH (ref 0.0–0.2)

## 2023-10-05 LAB — BASIC METABOLIC PANEL WITH GFR
Anion gap: 7 (ref 5–15)
BUN: 27 mg/dL — ABNORMAL HIGH (ref 8–23)
CO2: 23 mmol/L (ref 22–32)
Calcium: 8.9 mg/dL (ref 8.9–10.3)
Chloride: 104 mmol/L (ref 98–111)
Creatinine, Ser: 0.91 mg/dL (ref 0.61–1.24)
GFR, Estimated: >60 mL/min (ref >60)
Glucose, Bld: 161 mg/dL — ABNORMAL HIGH (ref 70–99)
Potassium: 4.4 mmol/L (ref 3.5–5.1)
Sodium: 134 mmol/L — ABNORMAL LOW (ref 135–145)

## 2023-10-05 LAB — GLUCOSE, CAPILLARY
Glucose-Capillary: 203 mg/dL — ABNORMAL HIGH (ref 70–99)
Glucose-Capillary: 211 mg/dL — ABNORMAL HIGH (ref 70–99)
Glucose-Capillary: 192 mg/dL — ABNORMAL HIGH (ref 70–99)
Glucose-Capillary: 169 mg/dL — ABNORMAL HIGH (ref 70–99)

## 2023-10-05 LAB — PROCALCITONIN: Procalcitonin: 1.5 ng/mL

## 2023-10-05 LAB — TYPE AND SCREEN
ABO/RH(D): O POS
Antibody Screen: NEGATIVE
Unit division: 0

## 2023-10-05 LAB — BPAM RBC
Blood Product Expiration Date: 202505202359
ISSUE DATE / TIME: 202504231246
Unit Type and Rh: 5100

## 2023-10-05 LAB — TROPONIN I (HIGH SENSITIVITY)
Troponin I (High Sensitivity): 8 ng/L (ref <18)
Troponin I (High Sensitivity): 9 ng/L (ref <18)

## 2023-10-05 MED ORDER — KETOROLAC TROMETHAMINE 15 MG/ML IJ SOLN
15.0000 mg | Freq: Once | INTRAMUSCULAR | Status: AC
  Administered 2023-10-05: 15 mg via INTRAVENOUS
  Filled 2023-10-05: qty 1

## 2023-10-05 MED ORDER — OXYCODONE HCL 5 MG PO TABS
7.5000 mg | ORAL_TABLET | ORAL | Status: DC | PRN
  Administered 2023-10-05 – 2023-10-06 (×2): 7.5 mg via ORAL
  Filled 2023-10-05 (×2): qty 2

## 2023-10-05 NOTE — TOC Progression Note (Addendum)
 Transition of Care [facility]) - Progression Note    Patient Details  Name: Jonathan Page MRN: [id number] Date of Birth: 1950-07-15  [facility]) CM/SW Contact  Jonni Nettle, LCSW Phone Number: 10/05/2023, 11:17 AM  Clinical Narrative:    CSW spoke with pt regarding bed offer choices at SNFs in [place] area. CSW provided pt with list of bed offers with Medicare star-rating. Pt chose [facility] for SNF choice. Pt is currently receiving chemotherapy, however is unsure of chemo schedule upon discharge. Per pt's Oncologist, Marlene Simas, MD, pt receives chemotherapy 1 day every 3 weeks and then Neulasta  injection 2 days after the chemo. Pt last received chemotherapy on 09/30/2023. English as a second language teacher for [facility] has been approved. Per Dr. Bobbetta Burnet, pt is having workup for chest tightness, but should be stable for discharge soon. CSW will follow up with [facility] admissions staff tomorrow morning.  [facility] [address] [place], [place] [address] [phone] Overall rating ?? Much below average  [facility] [address] [place], [place] [address] [phone] Overall rating ? Much below average  [facility] [address] [place], [place] [address] [phone] Overall rating?? Below average  Expected Discharge Plan: Skilled Nursing Facility Barriers to Discharge: Continued Medical Work up, SNF Pending bed offer  Expected Discharge Plan and Services In-house Referral: Clinical Social Work Discharge Planning Services: Other - See comment (SNF) Post Acute Care Choice: Skilled Nursing Facility Living arrangements for the past 2 months: Single Family Home                 DME Arranged: N/A DME Agency: NA       HH Arranged: NA HH Agency: NA         Social Determinants of Health (SDOH) Interventions SDOH Screenings   Food Insecurity: No Food Insecurity (10/03/2023)  Housing: Low Risk  (10/03/2023)   Transportation Needs: No Transportation Needs (10/03/2023)  Utilities: Not At Risk (10/03/2023)  Depression (PHQ2-9): Low Risk  (03/16/2023)  Social Connections: Socially Integrated (10/04/2023)  Tobacco Use: Medium Risk (10/02/2023)    Readmission Risk Interventions     No data to display

## 2023-10-05 NOTE — Inpatient Diabetes Management (Signed)
 Inpatient Diabetes Program Recommendations  AACE/ADA: New Consensus Statement on Inpatient Glycemic Control (2015)  Target Ranges:  Prepandial:   less than 140 mg/dL      Peak postprandial:   less than 180 mg/dL (1-2 hours)      Critically ill patients:  140 - 180 mg/dL   Lab Results  Component Value Date   GLUCAP 203 (H) 10/05/2023   HGBA1C 10.1 (A) 05/29/2023    Review of Glycemic Control  Latest Reference Range & Units 10/04/23 07:19 10/04/23 11:50 10/04/23 16:07 10/04/23 20:43 10/05/23 07:36  Glucose-Capillary 70 - 99 mg/dL 562 (H) 130 (H) 865 (H) 346 (H) 203 (H)  (H): Data is abnormally high  Diabetes history: DM2 Outpatient Diabetes medications: 70/30 5 units QAM, Glipizide  10 mg QD Current orders for Inpatient glycemic control: Novolog  0-9 units TID  Inpatient Diabetes Program Recommendations:    Might consider:  Novolog  2 units TID with meals if he consumes at least 50%.  Will continue to follow while inpatient.  Thank you, Hays Lipschutz, MSN, CDCES Diabetes Coordinator Inpatient Diabetes Program [phone] (team pager from 8a-5p)

## 2023-10-05 NOTE — Progress Notes (Signed)
 OT Cancellation Note  Patient Details Name: Jonathan Page MRN: [id number] DOB: 03-Mar-1951   Cancelled Treatment:    Reason Eval/Treat Not Completed: Medical issues which prohibited therapy. Pt received supine, reporting chest pain and requesting medication. BP noted to be soft 104/59, HR 107 - 111 at rest, and RR 20-27 with increased WOB despite SpO2 99% on RA. RN notified. OT will hold eval at this time and check back as able when appropriate   Leveta Wahab L. Raziyah Vanvleck, OTR/L  10/05/23, 12:08 PM

## 2023-10-05 NOTE — Plan of Care (Signed)
  Problem: Education: Goal: Ability to describe self-care measures that may prevent or decrease complications (Diabetes Survival Skills Education) will improve Outcome: Progressing   Problem: Coping: Goal: Ability to adjust to condition or change in health will improve Outcome: Progressing   Problem: Health Behavior/Discharge Planning: Goal: Ability to manage health-related needs will improve Outcome: Progressing   Problem: Nutritional: Goal: Progress toward achieving an optimal weight will improve Outcome: Progressing   Problem: Education: Goal: Knowledge of General Education information will improve Description: Including pain rating scale, medication(s)/side effects and non-pharmacologic comfort measures Outcome: Progressing   Problem: Health Behavior/Discharge Planning: Goal: Ability to manage health-related needs will improve Outcome: Progressing   Problem: Clinical Measurements: Goal: Respiratory complications will improve Outcome: Progressing Goal: Cardiovascular complication will be avoided Outcome: Progressing   Problem: Coping: Goal: Level of anxiety will decrease Outcome: Progressing   Problem: Elimination: Goal: Will not experience complications related to urinary retention Outcome: Progressing   Problem: Pain Managment: Goal: General experience of comfort will improve and/or be controlled Outcome: Progressing   Problem: Skin Integrity: Goal: Risk for impaired skin integrity will decrease Outcome: Progressing

## 2023-10-05 NOTE — Progress Notes (Signed)
 PROGRESS NOTE Jonathan Page  WUJ:[id number] DOB: 06/12/1951 DOA: 10/02/2023 PCP: [name], NP   Brief Narrative/Hospital Course:   41 yomw/history significant for metastatic NSC Lung CA bundle of CKD IIIa b/l creat 1.3,T2DM who presented ED due to weakness. Patient is on chemotherapy for his lung cancer and 2 days  PTA had a fall, was found to have a SAH in CT 09/30/22 and was discharged but presented again on 4/21 for recurrent fall and hypotension. In the ED afebrile hemodynamics fairly stable labs reviewed leukocytosis creatinine 1 Hb 7.8 CT head>>showed multifocal subarachnoid hemorrhage worse from prior exam. CT spine>>no fracture. Neurosurgery was consulted in the ED and recommended repeat imaging and q4hr neurochecks and was admitted> repeat CT unchanged SAH.  Subjective: Seen and examined No new complaints resting comfortably Later nursing reports he was having some chest pain/tightness Overnight patient remains afebrile on room air BP stable Labs reviewed stable.lites, hemoglobin improved 8.4 g following transfusion  Assessment/Plan:  Traumatic SAH Mechanical falls Deconditioning/debility Left hip pain recent lt hip IM hip screw 07/25/23 : Patient with fall and imaging showing SAH.Repeat CT 4/22 am stable- per neurosurgery no further intervention needed. No new neurological symptoms or signs remains nonfocal with weakness and painful left lower extremities unchanged Continue PT OT - and SNF has been advised.  Hypertension: Well-controlled on lisinopril    CAD: Asa on hold for now 2/2 SAH  Metastatic non-small cell lung cancer Chest tightness/pain back pain: Obtained chest x-ray, serial troponin and EKG  no ischemia noted Cxr- his previous ct and cxr from 3/31-disease progression, osseous rib metastatis Pain likely from his malignancy cont oxy, add toradol  and adjust meds. Patient on chemo followed by outpatient oncology Dr Marguerita Shih- get chemo  1 day q 3 wk and  then Neulasta  injection 2 days after the chemo  Will need to keep up with appointments.  Leukocytosis: Likely reactive. No fever.  Resolved  Chronic anemia-symptoms did Thrombocytopenia: Likely related to chemotherapy s.p 1 u PRBC 4/23 and hb appropriately increased .  Trend hb. Recently had blood transfusion for hemoglobin 6.6  on  4/19 per patient Recent Labs  Lab 09/30/23 1138 10/02/23 1620 10/03/23 0615 10/04/23 0307 10/05/23 0247  HGB 6.6* 7.8* 7.5* 7.1* 8.4*  HCT 21.4* 25.0* 23.8* 22.8* 26.9*    T2DM: Blood sugar poorly controlled cont ssi Recent Labs  Lab 10/04/23 0719 10/04/23 1150 10/04/23 1607 10/04/23 2043 10/05/23 0736  GLUCAP 173* 236* 148* 346* 203*   DVT prophylaxis: SCDs Start: 10/02/23 2201 Code Status:   Code Status: Full Code Family Communication: plan of care discussed with patient at bedside. Patient status is: Remains hospitalized because of severity of illness-Patient appears weak due to symptomatic anemia also having left hip pain from his recent hip surgery-is deconditioned and weak  Level of care: Telemetry    Dispo: The patient is from: home            Anticipated disposition: SNF in 24 hrs  Objective: Vitals last 24 hrs: Vitals:   10/05/23 0114 10/05/23 0522 10/05/23 0852 10/05/23 0959  BP: 115/78 104/71 99/71 95/66   Pulse: (!) 101 (!) 106  (!) 110  Resp: 18 18  18   Temp: 98.1 F (36.7 C) 98.5 F (36.9 C)  98.3 F (36.8 C)  TempSrc: Oral     SpO2: 96% 94%  90%  Weight:      Height:        Physical Examination: General exam: alert awake, oriented pleasant  HEENT:Oral mucosa  moist, Ear/Nose WNL grossly Respiratory system: Bilaterally diminished BS, no use of accessory muscle Cardiovascular system: S1 & S2 +. Gastrointestinal system: Abdomen soft, NT,ND,BS+ Nervous System: Alert, awake,following commands. Extremities: LE edema neg, moving arms but wil painful LLE mobility   Skin: No rashes,warm. MSK: Normal muscle bulk/tone.    Data Reviewed: I have personally reviewed following labs and imaging studies ( see epic result tab) CBC: Recent Labs  Lab 09/30/23 1138 10/02/23 1620 10/03/23 0615 10/04/23 0307 10/05/23 0247  WBC 7.9 17.8* 12.4* 8.1 9.2  NEUTROABS 7.5 14.0*  --   --   --   HGB 6.6* 7.8* 7.5* 7.1* 8.4*  HCT 21.4* 25.0* 23.8* 22.8* 26.9*  MCV 111.5* 107.3* 107.7* 107.5* 104.3*  PLT 120* 121* 122* 119* 134*   CMP: Recent Labs  Lab 09/30/23 1138 10/02/23 1620 10/04/23 0307 10/05/23 0247  NA 135 135 137 134*  K 4.7 4.2 4.0 4.4  CL 105 104 107 104  CO2 20* 21* 22 23  GLUCOSE 405* 234* 167* 161*  BUN 34* 33* 26* 27*  CREATININE 0.90 1.04 0.97 0.91  CALCIUM  8.7* 9.2 9.1 8.9   GFR: Estimated Creatinine Clearance: 65.9 mL/min (by C-G formula based on SCr of 0.91 mg/dL). Recent Labs  Lab 10/02/23 1620  AST 16  ALT 10  ALKPHOS 243*  BILITOT 1.2  PROT 6.1*  ALBUMIN 3.3*   No results for input(s): "LIPASE", "AMYLASE" in the last 168 hours. No results for input(s): "AMMONIA" in the last 168 hours. Coagulation Profile:  Recent Labs  Lab 09/30/23 1138 10/02/23 1620  INR 1.0 1.1    Medications reviewed:  Scheduled Meds:  Chlorhexidine  Gluconate Cloth  6 each Topical Daily   feeding supplement  237 mL Oral BID BM   gabapentin   300 mg Oral QHS   insulin  aspart  0-9 Units Subcutaneous TID WC   lisinopril   10 mg Oral Daily   senna-docusate  1 tablet Oral BID   sodium chloride  flush  10-40 mL Intracatheter Q12H   Continuous Infusions: Total time spent in review of labs and imaging, patient evaluation, formulation of plan, documentation and communication with patient/family: 35 minutes  Lesa Rape, MD [facility] 10/05/2023, 11:44 AM

## 2023-10-05 NOTE — Progress Notes (Signed)
   10/05/23 1456  Assess: MEWS Score  Temp 98.6 F (37 C)  BP 104/67  MAP (mmHg) 78  Pulse Rate (!) 111  Resp 20  SpO2 94 %  O2 Device Room Air  Assess: MEWS Score  MEWS Temp 0  MEWS Systolic 0  MEWS Pulse 2  MEWS RR 0  MEWS LOC 0  MEWS Score 2  MEWS Score Color Yellow  Assess: if the MEWS score is Yellow or Red  Were vital signs accurate and taken at a resting state? Yes  Does the patient meet 2 or more of the SIRS criteria? No  Does the patient have a confirmed or suspected source of infection? No  MEWS guidelines implemented  No, previously yellow, continue vital signs every 4 hours  Notify: Charge Nurse/RN  Name of Charge Nurse/RN Notified Shanice RN  Provider Notification  Provider Name/Title Lesa Rape MD \  Date Provider Notified 10/05/23  Time Provider Notified 1459  Method of Notification Page  Provider response No new orders  Date of Provider Response 10/05/23  Time of Provider Response 1505  Assess: SIRS CRITERIA  SIRS Temperature  0  SIRS Respirations  0  SIRS Pulse 1  SIRS WBC 0  SIRS Score Sum  1

## 2023-10-05 NOTE — Progress Notes (Signed)
   10/04/23 1945  Assess: MEWS Score  Temp 97.9 F (36.6 C)  BP 111/65  MAP (mmHg) 79  Pulse Rate (!) 115  Resp 20  SpO2 98 %  O2 Device Room Air  Assess: MEWS Score  MEWS Temp 0  MEWS Systolic 0  MEWS Pulse 2  MEWS RR 0  MEWS LOC 0  MEWS Score 2  MEWS Score Color Yellow  Assess: if the MEWS score is Yellow or Red  Were vital signs accurate and taken at a resting state? Yes  Does the patient meet 2 or more of the SIRS criteria? Yes  Does the patient have a confirmed or suspected source of infection? Yes  MEWS guidelines implemented  Yes, yellow  Treat  MEWS Interventions Considered administering scheduled or prn medications/treatments as ordered  Take Vital Signs  Increase Vital Sign Frequency  Yellow: Q2hr x1, continue Q4hrs until patient remains green for 12hrs  Escalate  MEWS: Escalate Yellow: Discuss with charge nurse and consider notifying provider and/or RRT  Notify: Charge Nurse/RN  Name of Charge Nurse/RN Notified Calvin  Provider Notification  Provider Name/Title Lorre Rosin  Date Provider Notified 10/04/23  Time Provider Notified 1950  Method of Notification Page  Notification Reason Change in status  Provider response No new orders  Date of Provider Response 10/04/23  Time of Provider Response 2030  Assess: SIRS CRITERIA  SIRS Temperature  0  SIRS Respirations  0  SIRS Pulse 1  SIRS WBC 0  SIRS Score Sum  1

## 2023-10-06 ENCOUNTER — Ambulatory Visit: Admitting: Surgical

## 2023-10-06 DIAGNOSIS — L89153 Pressure ulcer of sacral region, stage 3: Secondary | ICD-10-CM | POA: Diagnosis present

## 2023-10-06 DIAGNOSIS — Z681 Body mass index (BMI) 19 or less, adult: Secondary | ICD-10-CM | POA: Diagnosis not present

## 2023-10-06 DIAGNOSIS — Z7409 Other reduced mobility: Secondary | ICD-10-CM | POA: Diagnosis not present

## 2023-10-06 DIAGNOSIS — Z8679 Personal history of other diseases of the circulatory system: Secondary | ICD-10-CM | POA: Diagnosis not present

## 2023-10-06 DIAGNOSIS — J948 Other specified pleural conditions: Secondary | ICD-10-CM | POA: Diagnosis present

## 2023-10-06 DIAGNOSIS — T451X5A Adverse effect of antineoplastic and immunosuppressive drugs, initial encounter: Secondary | ICD-10-CM | POA: Diagnosis not present

## 2023-10-06 DIAGNOSIS — E872 Acidosis, unspecified: Secondary | ICD-10-CM | POA: Diagnosis present

## 2023-10-06 DIAGNOSIS — W19XXXA Unspecified fall, initial encounter: Secondary | ICD-10-CM | POA: Diagnosis not present

## 2023-10-06 DIAGNOSIS — Z794 Long term (current) use of insulin: Secondary | ICD-10-CM | POA: Diagnosis not present

## 2023-10-06 DIAGNOSIS — M47816 Spondylosis without myelopathy or radiculopathy, lumbar region: Secondary | ICD-10-CM | POA: Diagnosis not present

## 2023-10-06 DIAGNOSIS — R000 Tachycardia, unspecified: Secondary | ICD-10-CM | POA: Diagnosis not present

## 2023-10-06 DIAGNOSIS — Z7401 Bed confinement status: Secondary | ICD-10-CM | POA: Diagnosis not present

## 2023-10-06 DIAGNOSIS — Z79899 Other long term (current) drug therapy: Secondary | ICD-10-CM | POA: Diagnosis not present

## 2023-10-06 DIAGNOSIS — Z9049 Acquired absence of other specified parts of digestive tract: Secondary | ICD-10-CM | POA: Diagnosis not present

## 2023-10-06 DIAGNOSIS — R638 Other symptoms and signs concerning food and fluid intake: Secondary | ICD-10-CM | POA: Diagnosis not present

## 2023-10-06 DIAGNOSIS — M84552D Pathological fracture in neoplastic disease, left femur, subsequent encounter for fracture with routine healing: Secondary | ICD-10-CM | POA: Diagnosis not present

## 2023-10-06 DIAGNOSIS — G893 Neoplasm related pain (acute) (chronic): Secondary | ICD-10-CM | POA: Diagnosis present

## 2023-10-06 DIAGNOSIS — S066XAA Traumatic subarachnoid hemorrhage with loss of consciousness status unknown, initial encounter: Secondary | ICD-10-CM | POA: Diagnosis not present

## 2023-10-06 DIAGNOSIS — D6481 Anemia due to antineoplastic chemotherapy: Secondary | ICD-10-CM | POA: Diagnosis not present

## 2023-10-06 DIAGNOSIS — M898X9 Other specified disorders of bone, unspecified site: Secondary | ICD-10-CM | POA: Diagnosis not present

## 2023-10-06 DIAGNOSIS — J811 Chronic pulmonary edema: Secondary | ICD-10-CM | POA: Diagnosis present

## 2023-10-06 DIAGNOSIS — J984 Other disorders of lung: Secondary | ICD-10-CM | POA: Diagnosis not present

## 2023-10-06 DIAGNOSIS — Z7189 Other specified counseling: Secondary | ICD-10-CM | POA: Diagnosis not present

## 2023-10-06 DIAGNOSIS — R0902 Hypoxemia: Secondary | ICD-10-CM | POA: Diagnosis not present

## 2023-10-06 DIAGNOSIS — J9601 Acute respiratory failure with hypoxia: Secondary | ICD-10-CM | POA: Diagnosis not present

## 2023-10-06 DIAGNOSIS — M8448XS Pathological fracture, other site, sequela: Secondary | ICD-10-CM | POA: Diagnosis not present

## 2023-10-06 DIAGNOSIS — S066X0D Traumatic subarachnoid hemorrhage without loss of consciousness, subsequent encounter: Secondary | ICD-10-CM | POA: Diagnosis not present

## 2023-10-06 DIAGNOSIS — J90 Pleural effusion, not elsewhere classified: Secondary | ICD-10-CM | POA: Diagnosis not present

## 2023-10-06 DIAGNOSIS — R918 Other nonspecific abnormal finding of lung field: Secondary | ICD-10-CM | POA: Diagnosis not present

## 2023-10-06 DIAGNOSIS — M6281 Muscle weakness (generalized): Secondary | ICD-10-CM | POA: Diagnosis not present

## 2023-10-06 DIAGNOSIS — R42 Dizziness and giddiness: Secondary | ICD-10-CM | POA: Diagnosis not present

## 2023-10-06 DIAGNOSIS — R530 Neoplastic (malignant) related fatigue: Secondary | ICD-10-CM | POA: Diagnosis not present

## 2023-10-06 DIAGNOSIS — C3412 Malignant neoplasm of upper lobe, left bronchus or lung: Secondary | ICD-10-CM | POA: Diagnosis present

## 2023-10-06 DIAGNOSIS — D649 Anemia, unspecified: Secondary | ICD-10-CM | POA: Diagnosis not present

## 2023-10-06 DIAGNOSIS — C7801 Secondary malignant neoplasm of right lung: Secondary | ICD-10-CM | POA: Diagnosis not present

## 2023-10-06 DIAGNOSIS — D509 Iron deficiency anemia, unspecified: Secondary | ICD-10-CM | POA: Diagnosis present

## 2023-10-06 DIAGNOSIS — L89150 Pressure ulcer of sacral region, unstageable: Secondary | ICD-10-CM | POA: Diagnosis not present

## 2023-10-06 DIAGNOSIS — C779 Secondary and unspecified malignant neoplasm of lymph node, unspecified: Secondary | ICD-10-CM | POA: Diagnosis present

## 2023-10-06 DIAGNOSIS — Z5189 Encounter for other specified aftercare: Secondary | ICD-10-CM | POA: Diagnosis not present

## 2023-10-06 DIAGNOSIS — N189 Chronic kidney disease, unspecified: Secondary | ICD-10-CM | POA: Diagnosis not present

## 2023-10-06 DIAGNOSIS — E1122 Type 2 diabetes mellitus with diabetic chronic kidney disease: Secondary | ICD-10-CM | POA: Diagnosis not present

## 2023-10-06 DIAGNOSIS — R5381 Other malaise: Secondary | ICD-10-CM | POA: Diagnosis not present

## 2023-10-06 DIAGNOSIS — J189 Pneumonia, unspecified organism: Secondary | ICD-10-CM | POA: Diagnosis not present

## 2023-10-06 DIAGNOSIS — E46 Unspecified protein-calorie malnutrition: Secondary | ICD-10-CM | POA: Diagnosis not present

## 2023-10-06 DIAGNOSIS — Z5111 Encounter for antineoplastic chemotherapy: Secondary | ICD-10-CM | POA: Diagnosis present

## 2023-10-06 DIAGNOSIS — C3490 Malignant neoplasm of unspecified part of unspecified bronchus or lung: Secondary | ICD-10-CM | POA: Diagnosis not present

## 2023-10-06 DIAGNOSIS — K5900 Constipation, unspecified: Secondary | ICD-10-CM | POA: Diagnosis not present

## 2023-10-06 DIAGNOSIS — I1310 Hypertensive heart and chronic kidney disease without heart failure, with stage 1 through stage 4 chronic kidney disease, or unspecified chronic kidney disease: Secondary | ICD-10-CM | POA: Diagnosis not present

## 2023-10-06 DIAGNOSIS — K5903 Drug induced constipation: Secondary | ICD-10-CM | POA: Diagnosis not present

## 2023-10-06 DIAGNOSIS — E162 Hypoglycemia, unspecified: Secondary | ICD-10-CM | POA: Diagnosis not present

## 2023-10-06 DIAGNOSIS — J910 Malignant pleural effusion: Secondary | ICD-10-CM | POA: Diagnosis present

## 2023-10-06 DIAGNOSIS — D72829 Elevated white blood cell count, unspecified: Secondary | ICD-10-CM | POA: Diagnosis not present

## 2023-10-06 DIAGNOSIS — Z5112 Encounter for antineoplastic immunotherapy: Secondary | ICD-10-CM | POA: Diagnosis present

## 2023-10-06 DIAGNOSIS — Z7969 Long term (current) use of other immunomodulators and immunosuppressants: Secondary | ICD-10-CM | POA: Diagnosis not present

## 2023-10-06 DIAGNOSIS — F17290 Nicotine dependence, other tobacco product, uncomplicated: Secondary | ICD-10-CM | POA: Diagnosis not present

## 2023-10-06 DIAGNOSIS — N179 Acute kidney failure, unspecified: Secondary | ICD-10-CM | POA: Diagnosis present

## 2023-10-06 DIAGNOSIS — D696 Thrombocytopenia, unspecified: Secondary | ICD-10-CM | POA: Diagnosis present

## 2023-10-06 DIAGNOSIS — F419 Anxiety disorder, unspecified: Secondary | ICD-10-CM | POA: Diagnosis present

## 2023-10-06 DIAGNOSIS — I951 Orthostatic hypotension: Secondary | ICD-10-CM | POA: Diagnosis not present

## 2023-10-06 DIAGNOSIS — I959 Hypotension, unspecified: Secondary | ICD-10-CM | POA: Diagnosis not present

## 2023-10-06 DIAGNOSIS — Z66 Do not resuscitate: Secondary | ICD-10-CM | POA: Diagnosis not present

## 2023-10-06 DIAGNOSIS — C7951 Secondary malignant neoplasm of bone: Secondary | ICD-10-CM | POA: Diagnosis present

## 2023-10-06 DIAGNOSIS — R627 Adult failure to thrive: Secondary | ICD-10-CM | POA: Diagnosis present

## 2023-10-06 DIAGNOSIS — C7802 Secondary malignant neoplasm of left lung: Secondary | ICD-10-CM | POA: Diagnosis not present

## 2023-10-06 DIAGNOSIS — E119 Type 2 diabetes mellitus without complications: Secondary | ICD-10-CM | POA: Diagnosis not present

## 2023-10-06 DIAGNOSIS — Z711 Person with feared health complaint in whom no diagnosis is made: Secondary | ICD-10-CM | POA: Diagnosis not present

## 2023-10-06 DIAGNOSIS — J939 Pneumothorax, unspecified: Secondary | ICD-10-CM | POA: Diagnosis not present

## 2023-10-06 DIAGNOSIS — I10 Essential (primary) hypertension: Secondary | ICD-10-CM | POA: Diagnosis present

## 2023-10-06 DIAGNOSIS — E860 Dehydration: Secondary | ICD-10-CM | POA: Diagnosis not present

## 2023-10-06 DIAGNOSIS — R0602 Shortness of breath: Secondary | ICD-10-CM | POA: Diagnosis not present

## 2023-10-06 DIAGNOSIS — E11649 Type 2 diabetes mellitus with hypoglycemia without coma: Secondary | ICD-10-CM | POA: Diagnosis present

## 2023-10-06 DIAGNOSIS — Z515 Encounter for palliative care: Secondary | ICD-10-CM | POA: Diagnosis not present

## 2023-10-06 DIAGNOSIS — E1165 Type 2 diabetes mellitus with hyperglycemia: Secondary | ICD-10-CM | POA: Diagnosis present

## 2023-10-06 DIAGNOSIS — D329 Benign neoplasm of meninges, unspecified: Secondary | ICD-10-CM | POA: Diagnosis not present

## 2023-10-06 DIAGNOSIS — C3492 Malignant neoplasm of unspecified part of left bronchus or lung: Secondary | ICD-10-CM | POA: Diagnosis not present

## 2023-10-06 DIAGNOSIS — R0689 Other abnormalities of breathing: Secondary | ICD-10-CM | POA: Diagnosis not present

## 2023-10-06 DIAGNOSIS — E43 Unspecified severe protein-calorie malnutrition: Secondary | ICD-10-CM | POA: Diagnosis not present

## 2023-10-06 DIAGNOSIS — C799 Secondary malignant neoplasm of unspecified site: Secondary | ICD-10-CM | POA: Diagnosis not present

## 2023-10-06 DIAGNOSIS — R4589 Other symptoms and signs involving emotional state: Secondary | ICD-10-CM | POA: Diagnosis not present

## 2023-10-06 DIAGNOSIS — I609 Nontraumatic subarachnoid hemorrhage, unspecified: Secondary | ICD-10-CM | POA: Diagnosis present

## 2023-10-06 DIAGNOSIS — R5383 Other fatigue: Secondary | ICD-10-CM | POA: Diagnosis not present

## 2023-10-06 DIAGNOSIS — C801 Malignant (primary) neoplasm, unspecified: Secondary | ICD-10-CM | POA: Diagnosis not present

## 2023-10-06 DIAGNOSIS — Z923 Personal history of irradiation: Secondary | ICD-10-CM | POA: Diagnosis not present

## 2023-10-06 DIAGNOSIS — D529 Folate deficiency anemia, unspecified: Secondary | ICD-10-CM | POA: Diagnosis not present

## 2023-10-06 DIAGNOSIS — M25552 Pain in left hip: Secondary | ICD-10-CM | POA: Diagnosis not present

## 2023-10-06 DIAGNOSIS — R531 Weakness: Secondary | ICD-10-CM | POA: Diagnosis not present

## 2023-10-06 DIAGNOSIS — J439 Emphysema, unspecified: Secondary | ICD-10-CM | POA: Diagnosis not present

## 2023-10-06 DIAGNOSIS — J95811 Postprocedural pneumothorax: Secondary | ICD-10-CM | POA: Diagnosis not present

## 2023-10-06 LAB — GLUCOSE, CAPILLARY
Glucose-Capillary: 141 mg/dL — ABNORMAL HIGH (ref 70–99)
Glucose-Capillary: 161 mg/dL — ABNORMAL HIGH (ref 70–99)

## 2023-10-06 MED ORDER — ENSURE ENLIVE PO LIQD: 237.0000 mL | Freq: Two times a day (BID) | ORAL | Status: DC

## 2023-10-06 MED ORDER — HEPARIN SOD (PORK) LOCK FLUSH 100 UNIT/ML IV SOLN
500.0000 [IU] | INTRAVENOUS | Status: AC | PRN
  Administered 2023-10-06: 500 [IU]
  Filled 2023-10-06: qty 5

## 2023-10-06 MED ORDER — ORAL CARE MOUTH RINSE: 15.0000 mL | OROMUCOSAL | Status: DC | PRN

## 2023-10-06 MED ORDER — OXYCODONE HCL 5 MG PO TABS: 5.0000 mg | ORAL_TABLET | Freq: Three times a day (TID) | ORAL | 0 refills | Status: DC | PRN

## 2023-10-06 NOTE — Discharge Summary (Signed)
 Physician Discharge Summary  Jonathan Page ZOX:[id number] DOB: 1951/02/20 DOA: 10/02/2023  PCP: [name], NP  Admit date: 10/02/2023 Discharge date: 10/06/2023 Recommendations for Outpatient Follow-up:  Follow up with PCP in 1 weeks-call for appointment Please obtain BMP/CBC in one week  Discharge Dispo: SNF Discharge Condition: Stable Code Status:   Code Status: Full Code Diet recommendation:  Diet Order             Diet regular Room service appropriate? Yes; Fluid consistency: Thin  Diet effective now                    Brief/Interim Summary: Brief Narrative/Hospital Course:  72 yomw/history significant for metastatic NSC Lung CA bundle of CKD IIIa b/l creat 1.3,T2DM who presented ED due to weakness. Patient is on chemotherapy for his lung cancer and 2 days  PTA had a fall, was found to have a SAH in CT 09/30/22 and was discharged but presented again on 4/21 for recurrent fall and hypotension. In the ED afebrile hemodynamics fairly stable labs reviewed leukocytosis creatinine 1 Hb 7.8 CT head>>showed multifocal subarachnoid hemorrhage worse from prior exam. CT spine>>no fracture.Neurosurgery was consulted in the ED and recommended repeat imaging and q4hr neurochecks and was admitted> repeat CT unchanged SAH.  Patient was admitted, managed conservatively with pain control while having pain on the chest back suspect from his metastatic disease.  Also had symptomatic anemia and received 1 unit PRBC with improvement.  Remains weak and deconditioned.  PT suggested skilled nursing facility.  He is being discharged to SNF  Subjective: Seen and examined Pain controlled. Overnight remains afebrile BP stable no more chest pain   Discharge diagnosis  Traumatic SAH Mechanical falls Deconditioning/debility Left hip pain recent lt hip IM hip screw 07/25/23 : Patient with fall and imaging showing SAH.Repeat CT 4/22 am stable- per neurosurgery no further intervention needed. No  new neurological symptoms or signs remains nonfocal with weakness and painful left lower extremities unchanged Continue PT OT - and SNF has been advised.  Hypertension: Well-controlled on lisinopril    CAD: Patient no longer on aspirin  at home.    Metastatic non-small cell lung cancer Chest tightness/pain back pain: Obtained chest x-ray, serial troponin and EKG  no ischemia noted Cxr- his previous ct and cxr from 3/31-disease progression, osseous rib metastatis Pain likely from his malignancy cont oxy,  given iv toradol  and adjusted pain meds. Patient on chemo followed by outpatient oncology Dr Marguerita Shih- get chemo  1 day q 3 wk and then Neulasta  injection 2 days after the chemo -x-ray finding likely due to malignancy no symptoms of pneumonia.Will need to keep up with appointments.  Leukocytosis: Likely reactive. No fever.  Resolved  Chronic anemia-symptoms did Thrombocytopenia: Likely related to chemotherapy s.p 1 u PRBC 4/23 and hb appropriately increased .  Trend hb. Recently had blood transfusion for hemoglobin 6.6  on  4/19 per patient Recent Labs  Lab 09/30/23 1138 10/02/23 1620 10/03/23 0615 10/04/23 0307 10/05/23 0247  HGB 6.6* 7.8* 7.5* 7.1* 8.4*  HCT 21.4* 25.0* 23.8* 22.8* 26.9*    T2DM: Blood sugar poorly controlled cont ssi Recent Labs  Lab 10/05/23 0736 10/05/23 1214 10/05/23 1649 10/05/23 2033 10/06/23 0733  GLUCAP 203* 211* 192* 169* 141*      Discharge Exam: Vitals:   10/06/23 0501 10/06/23 0827  BP: 111/74 114/72  Pulse: (!) 105 (!) 105  Resp: 17   Temp: 98.7 F (37.1 C) 98.2 F (36.8 C)  SpO2: 94%  92%   General: Pt is alert, awake, not in acute distress Cardiovascular: RRR, S1/S2 +, no rubs, no gallops Respiratory: CTA bilaterally, no wheezing, no rhonchi Abdominal: Soft, NT, ND, bowel sounds + Extremities: no edema, no cyanosis  Discharge Instructions  Discharge Instructions     Discharge instructions   Complete by: As directed     Please call call MD or return to ER for similar or worsening recurring problem that brought you to hospital or if any fever,nausea/vomiting,abdominal pain, uncontrolled pain, chest pain,  shortness of breath or any other alarming symptoms.  Please follow-up your doctor as instructed in a week time and call the office for appointment.  Please avoid alcohol, smoking, or any other illicit substance and maintain healthy habits including taking your regular medications as prescribed.  You were cared for by a hospitalist during your hospital stay. If you have any questions about your discharge medications or the care you received while you were in the hospital after you are discharged, you can call the unit and ask to speak with the hospitalist on call if the hospitalist that took care of you is not available.  Once you are discharged, your primary care physician will handle any further medical issues. Please note that NO REFILLS for any discharge medications will be authorized once you are discharged, as it is imperative that you return to your primary care physician (or establish a relationship with a primary care physician if you do not have one) for your aftercare needs so that they can reassess your need for medications and monitor your lab values   Increase activity slowly   Complete by: As directed       Allergies as of 10/06/2023   No Known Allergies      Medication List     STOP taking these medications    aspirin  EC 81 MG tablet   empagliflozin  10 MG Tabs tablet Commonly known as: JARDIANCE        TAKE these medications    Accu-Chek Aviva Plus test strip Generic drug: glucose blood Check blood sugar before breakfast, lunch and bedtime and as directed. Dx E11.65   Accu-Chek Aviva Plus w/Device Kit Check blood sugar before breakfast, lunch and bedtime and as directed. Dx E11.65   Blood Glucose Monitoring Suppl Devi 1 each by Does not apply route as directed. May substitute  to any manufacturer covered by patient's insurance.   accu-chek soft touch lancets Check blood sugar before breakfast, lunch and bedtime and as directed. Dx E11.65   dexamethasone  4 MG tablet Commonly known as: DECADRON  Take 2 tabs by mouth 2 times daily starting day before chemo. Then take 2 tabs daily for 2 days starting day after chemo. Take with food.   diclofenac  Sodium 1 % Gel Commonly known as: VOLTAREN  Apply 2 g topically 4 (four) times daily.   docusate sodium  100 MG capsule Commonly known as: COLACE Take 1 capsule (100 mg total) by mouth 2 (two) times daily.   feeding supplement Liqd Take 237 mLs by mouth 2 (two) times daily between meals.   folic acid  1 MG tablet Commonly known as: FOLVITE  Take 1 mg by mouth in the morning.   gabapentin  300 MG capsule Commonly known as: NEURONTIN  Take 1 capsule (300 mg total) by mouth at bedtime. What changed: when to take this   glipiZIDE  10 MG 24 hr tablet Commonly known as: GLUCOTROL  XL Take 1 tablet (10 mg total) by mouth daily with breakfast. for diabetes.  insulin  NPH-regular Human (70-30) 100 UNIT/ML injection Inject 5 Units into the skin daily with breakfast.   lidocaine -prilocaine  cream Commonly known as: EMLA  Apply 1 Application topically as needed.   lisinopril  10 MG tablet Commonly known as: ZESTRIL  Take 1 tablet (10 mg total) by mouth daily. for blood pressure.   methocarbamol  500 MG tablet Commonly known as: ROBAXIN  Take 1 tablet (500 mg total) by mouth every 8 (eight) hours as needed for muscle spasms.   ondansetron  8 MG tablet Commonly known as: ZOFRAN  Take 8 mg by mouth every 8 (eight) hours as needed for nausea or vomiting.   oxyCODONE  5 MG immediate release tablet Commonly known as: Oxy IR/ROXICODONE  Take 1 tablet (5 mg total) by mouth every 8 (eight) hours as needed for up to 4 doses for moderate pain (pain score 4-6).   prochlorperazine  10 MG tablet Commonly known as: COMPAZINE  Take 1 tablet  (10 mg total) by mouth every 6 (six) hours as needed for nausea or vomiting.        Contact information for follow-up providers     Clark, Katherine K, NP Follow up in 1 week(s).   Specialty: Internal Medicine Contact information: [address] [name] [address] [phone]              Contact information for after-discharge care     Destination     [facility] Preferred SNF .   Service: Skilled Nursing Contact information: [address] [place]  [address] [phone]                    No Known Allergies  The results of significant diagnostics from this hospitalization (including imaging, microbiology, ancillary and laboratory) are listed below for reference.    Microbiology: No results found for this or any previous visit (from the past 240 hours).  Procedures/Studies: DG Chest Port 1 View Result Date: 10/05/2023 CLINICAL DATA:  Chest pain EXAM: PORTABLE CHEST 1 VIEW COMPARISON:  October 02, 2023 FINDINGS: No change in the right IJ Infuse-A-Port catheter Persistent improving left lung pneumonia infiltrates. With some infiltrates of the right lung as well without consolidations. Small left effusion Heart normal size IMPRESSION: Improving left lung pneumonia. Electronically Signed   By: Fredrich Jefferson M.D.   On: 10/05/2023 13:31   CT Head Wo Contrast Result Date: 10/03/2023 CLINICAL DATA:  Subarachnoid hemorrhage follow-up EXAM: CT HEAD WITHOUT CONTRAST TECHNIQUE: Contiguous axial images were obtained from the base of the skull through the vertex without intravenous contrast. RADIATION DOSE REDUCTION: This exam was performed according to the departmental dose-optimization program which includes automated exposure control, adjustment of the mA and/or kV according to patient size and/or use of iterative reconstruction technique. COMPARISON:  Yesterday FINDINGS: Brain: Similar pattern and degree of patchy subarachnoid hemorrhage  scattered along bilateral sulci, up to 7 mm in thickness at the left vertex. Trace left parafalcine subdural hemorrhage is noted along the posterior falx, stable. No evidence of infarct, hydrocephalus, or brain edema. There is cerebral volume loss with mild low-density in the cerebral white matter. No evidence of acute infarct. Vascular: No hyperdense vessel or unexpected calcification. Skull: Lytic appearance at the right occipital condyle, known osseous metastatic disease by prior report. Sinuses/Orbits: No acute finding IMPRESSION: Unchanged patchy subarachnoid hemorrhage along the bilateral cerebral convexity when compared to yesterday. Trace left parafalcine subdural hematoma shows no interval thickening. Electronically Signed   By: Ronnette Coke M.D.   On: 10/03/2023 05:39   DG Pelvis  Portable Result Date: 10/02/2023 CLINICAL DATA:  Fall with left hip pain. EXAM: PORTABLE PELVIS 1-2 VIEWS COMPARISON:  CT 09/11/2023 FINDINGS: IM rod and screw fixation left femur. No acute fracture or dislocation. Permeative of appearance of the medial cortex of the proximal left femur suspicious for metastases. IMPRESSION: No acute fracture or dislocation. Permeative of appearance of the medial cortex of the proximal left femur suspicious for metastases. Electronically Signed   By: Rozell Cornet M.D.   On: 10/02/2023 20:44   DG Chest Portable 1 View Result Date: 10/02/2023 CLINICAL DATA:  Lung cancer. EXAM: PORTABLE CHEST 1 VIEW COMPARISON:  None Available. FINDINGS: Unremarkable cardiac size. Extensive alveolar opacification left mid to lower lung consistent with pneumonia. Left-sided moderate pleural effusion. No pneumothorax. There is small area of alveolar opacity in the right mid hemithorax. Right-sided Port-A-Cath tip overlies distal SVC. IMPRESSION: Bilateral pulmonary opacities, left greater than right consistent with pneumonia, edema or mass. Left-sided effusion. Electronically Signed   By: Sydell Eva  M.D.   On: 10/02/2023 20:40   CT Head Wo Contrast Addendum Date: 10/02/2023 ADDENDUM REPORT: 10/02/2023 20:36 ADDENDUM: Critical Value/emergent results were called by telephone at the time of interpretation on 10/02/2023 at 8:36 pm to provider [facility] , who verbally acknowledged these results. Electronically Signed   By: Juanetta Nordmann M.D.   On: 10/02/2023 20:36   Result Date: 10/02/2023 CLINICAL DATA:  Fall EXAM: CT HEAD WITHOUT CONTRAST CT CERVICAL SPINE WITHOUT CONTRAST TECHNIQUE: Multidetector CT imaging of the head and cervical spine was performed following the standard protocol without intravenous contrast. Multiplanar CT image reconstructions of the cervical spine were also generated. RADIATION DOSE REDUCTION: This exam was performed according to the departmental dose-optimization program which includes automated exposure control, adjustment of the mA and/or kV according to patient size and/or use of iterative reconstruction technique. COMPARISON:  09/30/2023 FINDINGS: CT HEAD FINDINGS Brain: Multifocal acute subarachnoid hemorrhage over both hemispheres, greatest at the right sylvian fissure. The amount of hemorrhage is worsened since the prior examination. No midline shift or other mass effect. Confluent white matter hypoattenuation. Mild volume loss. Vascular: No hyperdense vessel or unexpected calcification. Skull: No fracture. Sinuses/Orbits: No acute finding. Other: None. CT CERVICAL SPINE FINDINGS Alignment: Normal. Skull base and vertebrae: There is widespread osseous metastatic disease affecting the ribs, cervical spine and lower skull base. Unchanged appearance of pathologic fractures at T1 and C6. No new fracture. Soft tissues and spinal canal: Hyperdense foci within the left subarticular zone at the C1-2 level. Disc levels:  No high-grade spinal canal stenosis. Upper chest: 6 mm right apical pulmonary nodule. Incompletely visualized left pleural effusion. Other: None IMPRESSION: 1.  Multifocal acute subarachnoid hemorrhage over both hemispheres, greatest at the right Sylvian fissure. The amount of hemorrhage is worsened since the prior examination. No midline shift or other mass effect. 2. Widespread osseous metastatic disease affecting the ribs, cervical spine and lower skull base. Unchanged appearance of pathologic fractures at T1 and C6. No new fracture. 3. Unchanged hyperdense foci within the left subarticular zone at the C1-2 level, favored to be a meningioma. 4. Incompletely visualized left pleural effusion. 5. Unchanged 6 mm right apical pulmonary nodule. Electronically Signed: By: Juanetta Nordmann M.D. On: 10/02/2023 20:27   CT Cervical Spine Wo Contrast Addendum Date: 10/02/2023 ADDENDUM REPORT: 10/02/2023 20:36 ADDENDUM: Critical Value/emergent results were called by telephone at the time of interpretation on 10/02/2023 at 8:36 pm to provider [facility] , who verbally acknowledged these results. Electronically Signed  By: Juanetta Nordmann M.D.   On: 10/02/2023 20:36   Result Date: 10/02/2023 CLINICAL DATA:  Fall EXAM: CT HEAD WITHOUT CONTRAST CT CERVICAL SPINE WITHOUT CONTRAST TECHNIQUE: Multidetector CT imaging of the head and cervical spine was performed following the standard protocol without intravenous contrast. Multiplanar CT image reconstructions of the cervical spine were also generated. RADIATION DOSE REDUCTION: This exam was performed according to the departmental dose-optimization program which includes automated exposure control, adjustment of the mA and/or kV according to patient size and/or use of iterative reconstruction technique. COMPARISON:  09/30/2023 FINDINGS: CT HEAD FINDINGS Brain: Multifocal acute subarachnoid hemorrhage over both hemispheres, greatest at the right sylvian fissure. The amount of hemorrhage is worsened since the prior examination. No midline shift or other mass effect. Confluent white matter hypoattenuation. Mild volume loss. Vascular: No  hyperdense vessel or unexpected calcification. Skull: No fracture. Sinuses/Orbits: No acute finding. Other: None. CT CERVICAL SPINE FINDINGS Alignment: Normal. Skull base and vertebrae: There is widespread osseous metastatic disease affecting the ribs, cervical spine and lower skull base. Unchanged appearance of pathologic fractures at T1 and C6. No new fracture. Soft tissues and spinal canal: Hyperdense foci within the left subarticular zone at the C1-2 level. Disc levels:  No high-grade spinal canal stenosis. Upper chest: 6 mm right apical pulmonary nodule. Incompletely visualized left pleural effusion. Other: None IMPRESSION: 1. Multifocal acute subarachnoid hemorrhage over both hemispheres, greatest at the right Sylvian fissure. The amount of hemorrhage is worsened since the prior examination. No midline shift or other mass effect. 2. Widespread osseous metastatic disease affecting the ribs, cervical spine and lower skull base. Unchanged appearance of pathologic fractures at T1 and C6. No new fracture. 3. Unchanged hyperdense foci within the left subarticular zone at the C1-2 level, favored to be a meningioma. 4. Incompletely visualized left pleural effusion. 5. Unchanged 6 mm right apical pulmonary nodule. Electronically Signed: By: Juanetta Nordmann M.D. On: 10/02/2023 20:27   CT Head Wo Contrast Result Date: 09/30/2023 CLINICAL DATA:  6 hour repeat head CT for subarachnoid hemorrhage identified this morning after a fall. EXAM: CT HEAD WITHOUT CONTRAST TECHNIQUE: Contiguous axial images were obtained from the base of the skull through the vertex without intravenous contrast. RADIATION DOSE REDUCTION: This exam was performed according to the departmental dose-optimization program which includes automated exposure control, adjustment of the mA and/or kV according to patient size and/or use of iterative reconstruction technique. COMPARISON:  CT head 09/30/2023 at 10:17 a.m. FINDINGS: Brain: Subarachnoid hemorrhage  overlying the left cerebral hemisphere is redemonstrated. The largest focus of hemorrhage overlies the left frontal/parietal convexity (series 2/image 29) and measures 2.8 x 1.5 cm, previously 2.1 x 1.3 cm using similar measuring technique. Minimal if any adjacent mass effect. Smaller foci overlying the posterior left parietal lobe have also increased. For example the largest measures 8 mm (series 2/image 25), previously 5 mm. Tiny focus of hemorrhage over the right parietal convexity near the vertex (series 5/image 41 is unchanged. Stable ventricular caliber. No midline shift. No evidence of acute infarct. Vascular: No hyperdense vessel or unexpected calcification. Skull: No calvarial fracture.  Left posterior scalp hematoma. Sinuses/Orbits: No acute abnormality. Other: None. IMPRESSION: 1. Foci of subarachnoid hemorrhage overlying the left cerebral hemisphere have mildly increased in size since earlier today. Minimal if any adjacent mass effect. No midline shift. 2. Tiny focus of hemorrhage over the right parietal convexity near the vertex is unchanged. Electronically Signed   By: Rozell Cornet M.D.   On: 09/30/2023 17:12  DG Hip Unilat W or Wo Pelvis 2-3 Views Left Result Date: 09/30/2023 CLINICAL DATA:  Status post fall. EXAM: DG HIP (WITH OR WITHOUT PELVIS) 2-3V LEFT COMPARISON:  CT from 09/11/23 FINDINGS: Again seen are postsurgical changes from IM nail and cannulated screw placement within the proximal femur and hip. Signs of diffuse, lytic osseous metastases are again noted involving the bony pelvis and proximal left femur. Heterotopic bone is again seen overlying the greater trochanter of the left proximal femur. There are no signs of acute fracture or dislocation. Spondylosis within the lower lumbar spine. IMPRESSION: 1. No acute findings. 2. Signs of diffuse osseous metastases. 3. Postsurgical changes from IM nail and cannulated screw placement within the proximal left femur and hip. Electronically  Signed   By: Kimberley Penman M.D.   On: 09/30/2023 11:23   CT Head Wo Contrast Result Date: 09/30/2023 CLINICAL DATA:  Status post fall. EXAM: CT HEAD WITHOUT CONTRAST CT CERVICAL SPINE WITHOUT CONTRAST TECHNIQUE: Multidetector CT imaging of the head and cervical spine was performed following the standard protocol without intravenous contrast. Multiplanar CT image reconstructions of the cervical spine were also generated. RADIATION DOSE REDUCTION: This exam was performed according to the departmental dose-optimization program which includes automated exposure control, adjustment of the mA and/or kV according to patient size and/or use of iterative reconstruction technique. COMPARISON:  Brain MRI 07/08/2023 FINDINGS: CT HEAD FINDINGS Brain: Multiple areas of subarachnoid hyperdensity are noted overlying the left cerebral hemisphere. The largest overlies the left parietal convexity measuring 1.5 by 1.1 cm, image 29/6. Over the left parietal convexity there are 2 smaller foci of subarachnoid hyperdensity which measure around 5-6 mm, image 27/3. Along the left posterior parietal lobe there is focal area of subarachnoid hyperdensity, image 25/6. Tiny focus of hyperdensity over the right parietal convexity is also noted, image 29/6. No midline shift.  No intraventricular hemorrhage or hydrocephalus. No subdural hematoma identified. No signs of acute brain infarct. There is mild diffuse low-attenuation within the subcortical and periventricular white matter compatible with chronic microvascular disease. Prominence of the sulci and ventricles compatible with brain atrophy. Vascular: No hyperdense vessel or unexpected calcification. Skull: No acute fracture. Lucent bone lesions within the right-side of the base of skull is noted, image 25/9. Sinuses/Orbits: No acute finding. Other: None. CT CERVICAL SPINE FINDINGS Alignment: Normal. Skull base and vertebrae: Permeative appearance of the cervical vertebra and skull base  identified compatible known diffuse lytic bone metastases. There is an age indeterminate fracture deformity involving the left side of the T1 vertebra, image 43/7. Similarly within the left side of the C6 vertebra there is an expansile lytic lesion with pathologic fracture through the superior endplate and left lateral mass, image 30/5. Soft tissues and spinal canal: No prevertebral fluid or swelling. No visible canal hematoma. Disc levels:  Multilevel disc space narrowing and endplate spurring. Upper chest: Partially visualized tumor encasing the left upper lobe is identified. Extensive lytic bone metastases are identified involving the visualized portions of the ribs and upper thoracic spine. Multiple pulmonary nodules identified within the right lung. Other: None IMPRESSION: 1. Multiple areas of subarachnoid hyperdensity are noted overlying the left cerebral hemisphere. The largest overlies the left parietal convexity measuring 1.5 x 1.1 cm. Findings are compatible with acute subarachnoid hemorrhage. 2. Tiny focus of hyperdensity over the right parietal convexity is also noted. This may represent a tiny focus of subarachnoid hemorrhage. 3. Permeative appearance of the cervical vertebra and skull base compatible with known diffuse lytic bone metastases.  4. Age indeterminate pathologic fractures involve the C6 and T1 vertebra. No significant vertebral body height loss at these levels. If there is a clinical concern for tumor involvement of the cervical canal then a contrast enhanced MRI of the cervical spine would be advised. 5. Partially visualized tumor encasing the left upper lobe is identified. Extensive lytic bone metastases are identified involving the visualized portions of the ribs and upper thoracic spine. Multiple pulmonary nodules identified within the right lung. 6. Chronic microvascular disease and brain atrophy. Critical Value/emergent results were called by telephone at the time of interpretation on  09/30/2023 at 11:00 am to provider [facility] , who verbally acknowledged these results. Electronically Signed   By: Kimberley Penman M.D.   On: 09/30/2023 11:01   CT Cervical Spine Wo Contrast Result Date: 09/30/2023 CLINICAL DATA:  Status post fall. EXAM: CT HEAD WITHOUT CONTRAST CT CERVICAL SPINE WITHOUT CONTRAST TECHNIQUE: Multidetector CT imaging of the head and cervical spine was performed following the standard protocol without intravenous contrast. Multiplanar CT image reconstructions of the cervical spine were also generated. RADIATION DOSE REDUCTION: This exam was performed according to the departmental dose-optimization program which includes automated exposure control, adjustment of the mA and/or kV according to patient size and/or use of iterative reconstruction technique. COMPARISON:  Brain MRI 07/08/2023 FINDINGS: CT HEAD FINDINGS Brain: Multiple areas of subarachnoid hyperdensity are noted overlying the left cerebral hemisphere. The largest overlies the left parietal convexity measuring 1.5 by 1.1 cm, image 29/6. Over the left parietal convexity there are 2 smaller foci of subarachnoid hyperdensity which measure around 5-6 mm, image 27/3. Along the left posterior parietal lobe there is focal area of subarachnoid hyperdensity, image 25/6. Tiny focus of hyperdensity over the right parietal convexity is also noted, image 29/6. No midline shift.  No intraventricular hemorrhage or hydrocephalus. No subdural hematoma identified. No signs of acute brain infarct. There is mild diffuse low-attenuation within the subcortical and periventricular white matter compatible with chronic microvascular disease. Prominence of the sulci and ventricles compatible with brain atrophy. Vascular: No hyperdense vessel or unexpected calcification. Skull: No acute fracture. Lucent bone lesions within the right-side of the base of skull is noted, image 25/9. Sinuses/Orbits: No acute finding. Other: None. CT CERVICAL SPINE  FINDINGS Alignment: Normal. Skull base and vertebrae: Permeative appearance of the cervical vertebra and skull base identified compatible known diffuse lytic bone metastases. There is an age indeterminate fracture deformity involving the left side of the T1 vertebra, image 43/7. Similarly within the left side of the C6 vertebra there is an expansile lytic lesion with pathologic fracture through the superior endplate and left lateral mass, image 30/5. Soft tissues and spinal canal: No prevertebral fluid or swelling. No visible canal hematoma. Disc levels:  Multilevel disc space narrowing and endplate spurring. Upper chest: Partially visualized tumor encasing the left upper lobe is identified. Extensive lytic bone metastases are identified involving the visualized portions of the ribs and upper thoracic spine. Multiple pulmonary nodules identified within the right lung. Other: None IMPRESSION: 1. Multiple areas of subarachnoid hyperdensity are noted overlying the left cerebral hemisphere. The largest overlies the left parietal convexity measuring 1.5 x 1.1 cm. Findings are compatible with acute subarachnoid hemorrhage. 2. Tiny focus of hyperdensity over the right parietal convexity is also noted. This may represent a tiny focus of subarachnoid hemorrhage. 3. Permeative appearance of the cervical vertebra and skull base compatible with known diffuse lytic bone metastases. 4. Age indeterminate pathologic fractures involve the C6 and T1  vertebra. No significant vertebral body height loss at these levels. If there is a clinical concern for tumor involvement of the cervical canal then a contrast enhanced MRI of the cervical spine would be advised. 5. Partially visualized tumor encasing the left upper lobe is identified. Extensive lytic bone metastases are identified involving the visualized portions of the ribs and upper thoracic spine. Multiple pulmonary nodules identified within the right lung. 6. Chronic microvascular  disease and brain atrophy. Critical Value/emergent results were called by telephone at the time of interpretation on 09/30/2023 at 11:00 am to provider [facility] , who verbally acknowledged these results. Electronically Signed   By: Kimberley Penman M.D.   On: 09/30/2023 11:01   CT CHEST ABDOMEN PELVIS WO CONTRAST Result Date: 09/15/2023 CLINICAL DATA:  Non-small-cell lung cancer. Status post chemotherapy. * Tracking Code: BO * EXAM: CT CHEST, ABDOMEN AND PELVIS WITHOUT CONTRAST TECHNIQUE: Multidetector CT imaging of the chest, abdomen and pelvis was performed following the standard protocol without IV contrast. RADIATION DOSE REDUCTION: This exam was performed according to the departmental dose-optimization program which includes automated exposure control, adjustment of the mA and/or kV according to patient size and/or use of iterative reconstruction technique. COMPARISON:  05/29/2023 CTs.  PET of 07/10/2023 FINDINGS: CT CHEST FINDINGS Cardiovascular: Right Port-A-Cath tip high right atrium. Aortic atherosclerosis. Tortuous thoracic aorta. Normal heart size, without pericardial effusion. Lad coronary artery calcification. Pulmonary artery enlargement, outflow tract 3.5 cm. Mediastinum/Nodes: Left supraclavicular adenopathy at 1.0 cm on 09/02 versus 9 mm on the prior. Infiltrative tumor and/or adenopathy extending to the left side of the mediastinum, including on 28/2, relatively similar. No right-sided mediastinal adenopathy. Hilar regions poorly evaluated without intravenous contrast. Lungs/Pleura: Moderate left pleural effusion is new since the prior diagnostic CT with mild loculation superiorly. A small right pleural effusion is also new. Left upper lobe endobronchial obstruction again identified. Central left upper lobe lung mass measures 4.3 x 3.6 cm on 64/4 versus 4.4 x 3.2 cm when remeasured in a similar fashion on the prior, suggesting relative stability. Progressive tumor extension/endobronchial spread  throughout the left upper lobe, with confluent soft tissue thickening along the peribronchovascular bundle on 74/4 including at up to 1.9 cm thickness today versus 8 mm on the prior CT (when remeasured). Innumerable bilateral pulmonary nodules are progressive. Example within the subpleural right upper lobe at 9 8 mm on 50/4, 1-2 mm on the prior. Musculoskeletal: Included within the abdomen pelvic section. CT ABDOMEN PELVIS FINDINGS Hepatobiliary: Tiny hepatic cysts. Normal gallbladder, without biliary ductal dilatation. Pancreas: Normal, without mass or ductal dilatation. Spleen: Normal in size, without focal abnormality. Adrenals/Urinary Tract: Bilateral adrenal thickening again identified. Hyperattenuating material in both collecting systems, increased since 07/10/2023 PET. Multiple bilateral low-density renal lesions are likely cysts. No hydronephrosis. Degraded evaluation of the pelvis, secondary to beam hardening artifact from left proximal femur fixation. No gross bladder abnormality. Stomach/Bowel: Normal stomach, without wall thickening. Colonic stool burden suggests constipation. Normal terminal ileum. Normal small bowel. Vascular/Lymphatic: Aortic atherosclerosis. No abdominopelvic adenopathy. Reproductive: Normal prostate. Other: No significant free fluid. No free intraperitoneal air. No evidence of omental or peritoneal disease. Musculoskeletal: Soft tissue nodule superficial to the xiphoid process measures 2.6 cm on 44/2 versus 1.6 cm on the prior. Interval left proximal femur fixation. Permeative destruction of the left first rib is again identified anteriorly, progressive posteriorly. Posterior right tenth rib metastasis with possible pathologic fracture including on 64/2, new. Increased lytic lesions throughout the majority of the upper and midthoracic spine. IMPRESSION:  1. Disease progression, as evidenced by increased peribronchovascular tumor spread within the left upper lobe, as well as diffuse  progressive pulmonary metastasis. The central left upper lobe primary and direct mediastinal invasion/nodal metastasis are not significantly changed. 2. Progressive diffuse osseous metastasis. Enlarged soft tissue mass superficial the xiphoid process is likely a subcutaneous metastasis. 3. Moderate loculated left pleural effusion. New small right pleural effusion. 4. Increased hyperattenuating material throughout both renal collecting systems. Although a component of this could represent retained contrast, primarily felt to represent progressive calculi. 5. Incidental findings, including: Aortic atherosclerosis (ICD10-I70.0), coronary artery atherosclerosis and emphysema (ICD10-J43.9). Pulmonary artery enlargement suggests pulmonary arterial hypertension. Electronically Signed   By: Lore Rode M.D.   On: 09/15/2023 13:59   XR FEMUR MIN 2 VIEWS LEFT Result Date: 09/08/2023 AP and lateral view of left femur reviewed.  Intramedullary nail in good position and alignment without any complication.  No evidence of hardware failure.  No fracture.  No dislocation.  Mild degenerative changes of the left hip joint   Labs: BNP (last 3 results) No results for input(s): "BNP" in the last 8760 hours. Basic Metabolic Panel: Recent Labs  Lab 09/30/23 1138 10/02/23 1620 10/04/23 0307 10/05/23 0247  NA 135 135 137 134*  K 4.7 4.2 4.0 4.4  CL 105 104 107 104  CO2 20* 21* 22 23  GLUCOSE 405* 234* 167* 161*  BUN 34* 33* 26* 27*  CREATININE 0.90 1.04 0.97 0.91  CALCIUM  8.7* 9.2 9.1 8.9   Liver Function Tests: Recent Labs  Lab 10/02/23 1620  AST 16  ALT 10  ALKPHOS 243*  BILITOT 1.2  PROT 6.1*  ALBUMIN 3.3*   No results for input(s): "LIPASE", "AMYLASE" in the last 168 hours. No results for input(s): "AMMONIA" in the last 168 hours. CBC: Recent Labs  Lab 09/30/23 1138 10/02/23 1620 10/03/23 0615 10/04/23 0307 10/05/23 0247  WBC 7.9 17.8* 12.4* 8.1 9.2  NEUTROABS 7.5 14.0*  --   --   --    HGB 6.6* 7.8* 7.5* 7.1* 8.4*  HCT 21.4* 25.0* 23.8* 22.8* 26.9*  MCV 111.5* 107.3* 107.7* 107.5* 104.3*  PLT 120* 121* 122* 119* 134*  Anemia work up No results for input(s): "VITAMINB12", "FOLATE", "FERRITIN", "TIBC", "IRON ", "RETICCTPCT" in the last 72 hours. Urinalysis    Component Value Date/Time   COLORURINE YELLOW 04/20/2023 1945   APPEARANCEUR CLEAR 04/20/2023 1945   LABSPEC 1.019 04/20/2023 1945   PHURINE 5.0 04/20/2023 1945   GLUCOSEU NEGATIVE 04/20/2023 1945   HGBUR NEGATIVE 04/20/2023 1945   BILIRUBINUR NEGATIVE 04/20/2023 1945   KETONESUR 5 (A) 04/20/2023 1945   PROTEINUR 30 (A) 04/20/2023 1945   NITRITE NEGATIVE 04/20/2023 1945   LEUKOCYTESUR NEGATIVE 04/20/2023 1945   Sepsis Labs Recent Labs  Lab 10/02/23 1620 10/03/23 0615 10/04/23 0307 10/05/23 0247  WBC 17.8* 12.4* 8.1 9.2   Microbiology No results found for this or any previous visit (from the past 240 hours).  Time coordinating discharge: 35 minutes  SIGNED: Lesa Rape, MD  [facility] 10/06/2023, 11:04 AM  If 7PM-7AM, please contact night-coverage [email or web address]

## 2023-10-06 NOTE — Progress Notes (Signed)
 Ist attempt to give report to [facility].

## 2023-10-06 NOTE — TOC Transition Note (Addendum)
 Transition of Care [facility]) - Discharge Note   Patient Details  Name: Jonathan Page MRN: [id number] Date of Birth: [birth year]  [facility] of [facility] CM/SW Contact:  Jonni Nettle, LCSW Phone Number: 10/06/2023, 11:55 AM   Clinical Narrative:    Pt discharging to [facility] for ST SNF services, Room 123B. CSW faxed discharge summary and SNF Transfer report to [facility]. D/C packet with paper prescription placed in folder at RN station. RN to call report to [phone]. CSW called [facility] at 11:44 AM. Pt aware and agreeable to discharge plan. No further TOC needs at this time.   Final next level of care: Skilled Nursing Facility Barriers to Discharge: Continued Medical Work up, SNF Pending bed offer   Patient Goals and CMS Choice Patient states their goals for this hospitalization and ongoing recovery are:: To go to SNF after discharge CMS Medicare.gov Compare Post Acute Care list provided to:: Patient Choice offered to / list presented to : Patient     Discharge Placement  [facility]  Discharge Plan and Services Additional resources added to the After Visit Summary for   In-house Referral: Clinical Social Work Discharge Planning Services: Other - See comment (SNF) Post Acute Care Choice: Skilled Nursing Facility          DME Arranged: N/A DME Agency: NA  HH Arranged: NA HH Agency: NA     Social Drivers of Health (SDOH) Interventions SDOH Screenings   Food Insecurity: No Food Insecurity (10/03/2023)  Housing: Low Risk  (10/03/2023)  Transportation Needs: No Transportation Needs (10/03/2023)  Utilities: Not At Risk (10/03/2023)  Depression (PHQ2-9): Low Risk  (03/16/2023)  Social Connections: Socially Integrated (10/04/2023)  Tobacco Use: Medium Risk (10/02/2023)    Readmission Risk Interventions     No data to display          [place], LCSW 10/06/2023 12:00 PM

## 2023-10-06 NOTE — Progress Notes (Signed)
 [facility] Liaison Note  10/06/2023  Jonathan Page [birth year] [id number]  Location: RN Hospital Liaison screened the patient remotely at [facility].  Insurance: [employer] HMO   Jonathan Page is a 73 y.o. 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 The patient was screened for readmission hospitalization with noted high risk score for unplanned readmission risk with 1 IP/1 ED in 6 months.  The patient was assessed for potential Care Management service needs for post hospital transition for care coordination. Review of patient's electronic medical record reveals patient was admitted for [facility] (Subarachnoid hemorrhage). Pt recommended for SNF level of care and transitioned to [facility] for ongoing rehabilitation. The facility will continue to address pt's ongoing needs.   [facility] Care Management/Population Health does not replace or interfere with any arrangements made by the Inpatient Transition of Care team.   For questions contact:   Lilla Reichert, RN, BSN Hospital Liaison [place]   [facility], Population Health Office Hours MTWF  8:00 am-6:00 pm Direct Dial: [phone] mobile [email or web address]

## 2023-10-07 DIAGNOSIS — C799 Secondary malignant neoplasm of unspecified site: Secondary | ICD-10-CM | POA: Diagnosis not present

## 2023-10-07 DIAGNOSIS — C3490 Malignant neoplasm of unspecified part of unspecified bronchus or lung: Secondary | ICD-10-CM | POA: Diagnosis not present

## 2023-10-07 DIAGNOSIS — K5900 Constipation, unspecified: Secondary | ICD-10-CM | POA: Diagnosis not present

## 2023-10-07 DIAGNOSIS — N189 Chronic kidney disease, unspecified: Secondary | ICD-10-CM | POA: Diagnosis not present

## 2023-10-07 DIAGNOSIS — E1122 Type 2 diabetes mellitus with diabetic chronic kidney disease: Secondary | ICD-10-CM | POA: Diagnosis not present

## 2023-10-07 DIAGNOSIS — Z7409 Other reduced mobility: Secondary | ICD-10-CM | POA: Diagnosis not present

## 2023-10-07 DIAGNOSIS — R531 Weakness: Secondary | ICD-10-CM | POA: Diagnosis not present

## 2023-10-07 DIAGNOSIS — I1310 Hypertensive heart and chronic kidney disease without heart failure, with stage 1 through stage 4 chronic kidney disease, or unspecified chronic kidney disease: Secondary | ICD-10-CM | POA: Diagnosis not present

## 2023-10-07 DIAGNOSIS — D529 Folate deficiency anemia, unspecified: Secondary | ICD-10-CM | POA: Diagnosis not present

## 2023-10-08 DIAGNOSIS — C799 Secondary malignant neoplasm of unspecified site: Secondary | ICD-10-CM | POA: Diagnosis not present

## 2023-10-08 DIAGNOSIS — C3490 Malignant neoplasm of unspecified part of unspecified bronchus or lung: Secondary | ICD-10-CM | POA: Diagnosis not present

## 2023-10-08 DIAGNOSIS — R531 Weakness: Secondary | ICD-10-CM | POA: Diagnosis not present

## 2023-10-08 DIAGNOSIS — Z7409 Other reduced mobility: Secondary | ICD-10-CM | POA: Diagnosis not present

## 2023-10-08 DIAGNOSIS — G893 Neoplasm related pain (acute) (chronic): Secondary | ICD-10-CM | POA: Diagnosis not present

## 2023-10-09 DIAGNOSIS — E46 Unspecified protein-calorie malnutrition: Secondary | ICD-10-CM | POA: Diagnosis not present

## 2023-10-09 DIAGNOSIS — K5900 Constipation, unspecified: Secondary | ICD-10-CM | POA: Diagnosis not present

## 2023-10-09 DIAGNOSIS — G893 Neoplasm related pain (acute) (chronic): Secondary | ICD-10-CM | POA: Diagnosis not present

## 2023-10-09 DIAGNOSIS — R531 Weakness: Secondary | ICD-10-CM | POA: Diagnosis not present

## 2023-10-09 DIAGNOSIS — Z7409 Other reduced mobility: Secondary | ICD-10-CM | POA: Diagnosis not present

## 2023-10-09 DIAGNOSIS — C799 Secondary malignant neoplasm of unspecified site: Secondary | ICD-10-CM | POA: Diagnosis not present

## 2023-10-09 DIAGNOSIS — Z794 Long term (current) use of insulin: Secondary | ICD-10-CM | POA: Diagnosis not present

## 2023-10-09 DIAGNOSIS — E1165 Type 2 diabetes mellitus with hyperglycemia: Secondary | ICD-10-CM | POA: Diagnosis not present

## 2023-10-09 DIAGNOSIS — C3490 Malignant neoplasm of unspecified part of unspecified bronchus or lung: Secondary | ICD-10-CM | POA: Diagnosis not present

## 2023-10-10 DIAGNOSIS — C7951 Secondary malignant neoplasm of bone: Secondary | ICD-10-CM | POA: Diagnosis not present

## 2023-10-10 DIAGNOSIS — G893 Neoplasm related pain (acute) (chronic): Secondary | ICD-10-CM | POA: Diagnosis not present

## 2023-10-10 DIAGNOSIS — Z7409 Other reduced mobility: Secondary | ICD-10-CM | POA: Diagnosis not present

## 2023-10-10 DIAGNOSIS — R531 Weakness: Secondary | ICD-10-CM | POA: Diagnosis not present

## 2023-10-10 DIAGNOSIS — D509 Iron deficiency anemia, unspecified: Secondary | ICD-10-CM | POA: Diagnosis not present

## 2023-10-10 DIAGNOSIS — D6481 Anemia due to antineoplastic chemotherapy: Secondary | ICD-10-CM | POA: Diagnosis not present

## 2023-10-10 DIAGNOSIS — E1165 Type 2 diabetes mellitus with hyperglycemia: Secondary | ICD-10-CM | POA: Diagnosis not present

## 2023-10-11 DIAGNOSIS — E1165 Type 2 diabetes mellitus with hyperglycemia: Secondary | ICD-10-CM | POA: Diagnosis not present

## 2023-10-11 DIAGNOSIS — R531 Weakness: Secondary | ICD-10-CM | POA: Diagnosis not present

## 2023-10-11 DIAGNOSIS — D649 Anemia, unspecified: Secondary | ICD-10-CM | POA: Diagnosis not present

## 2023-10-11 DIAGNOSIS — G893 Neoplasm related pain (acute) (chronic): Secondary | ICD-10-CM | POA: Diagnosis not present

## 2023-10-11 DIAGNOSIS — Z681 Body mass index (BMI) 19 or less, adult: Secondary | ICD-10-CM | POA: Diagnosis not present

## 2023-10-11 DIAGNOSIS — I609 Nontraumatic subarachnoid hemorrhage, unspecified: Secondary | ICD-10-CM | POA: Diagnosis not present

## 2023-10-12 ENCOUNTER — Encounter: Payer: Self-pay | Admitting: Internal Medicine

## 2023-10-12 ENCOUNTER — Inpatient Hospital Stay: Attending: Physician Assistant

## 2023-10-12 ENCOUNTER — Inpatient Hospital Stay

## 2023-10-12 DIAGNOSIS — D649 Anemia, unspecified: Secondary | ICD-10-CM

## 2023-10-12 DIAGNOSIS — T451X5A Adverse effect of antineoplastic and immunosuppressive drugs, initial encounter: Secondary | ICD-10-CM | POA: Diagnosis not present

## 2023-10-12 DIAGNOSIS — Z9049 Acquired absence of other specified parts of digestive tract: Secondary | ICD-10-CM | POA: Insufficient documentation

## 2023-10-12 DIAGNOSIS — M47816 Spondylosis without myelopathy or radiculopathy, lumbar region: Secondary | ICD-10-CM | POA: Insufficient documentation

## 2023-10-12 DIAGNOSIS — D509 Iron deficiency anemia, unspecified: Secondary | ICD-10-CM

## 2023-10-12 DIAGNOSIS — Z95828 Presence of other vascular implants and grafts: Secondary | ICD-10-CM

## 2023-10-12 DIAGNOSIS — M898X9 Other specified disorders of bone, unspecified site: Secondary | ICD-10-CM | POA: Insufficient documentation

## 2023-10-12 DIAGNOSIS — Z66 Do not resuscitate: Secondary | ICD-10-CM | POA: Diagnosis not present

## 2023-10-12 DIAGNOSIS — Z5189 Encounter for other specified aftercare: Secondary | ICD-10-CM | POA: Diagnosis not present

## 2023-10-12 DIAGNOSIS — C7951 Secondary malignant neoplasm of bone: Secondary | ICD-10-CM | POA: Diagnosis not present

## 2023-10-12 DIAGNOSIS — K5900 Constipation, unspecified: Secondary | ICD-10-CM | POA: Diagnosis not present

## 2023-10-12 DIAGNOSIS — C3412 Malignant neoplasm of upper lobe, left bronchus or lung: Secondary | ICD-10-CM | POA: Diagnosis not present

## 2023-10-12 DIAGNOSIS — C3490 Malignant neoplasm of unspecified part of unspecified bronchus or lung: Secondary | ICD-10-CM

## 2023-10-12 DIAGNOSIS — D6481 Anemia due to antineoplastic chemotherapy: Secondary | ICD-10-CM | POA: Insufficient documentation

## 2023-10-12 DIAGNOSIS — S066XAA Traumatic subarachnoid hemorrhage with loss of consciousness status unknown, initial encounter: Secondary | ICD-10-CM | POA: Insufficient documentation

## 2023-10-12 DIAGNOSIS — Z79899 Other long term (current) drug therapy: Secondary | ICD-10-CM | POA: Diagnosis not present

## 2023-10-12 DIAGNOSIS — W19XXXA Unspecified fall, initial encounter: Secondary | ICD-10-CM | POA: Insufficient documentation

## 2023-10-12 DIAGNOSIS — Z8679 Personal history of other diseases of the circulatory system: Secondary | ICD-10-CM | POA: Insufficient documentation

## 2023-10-12 DIAGNOSIS — I10 Essential (primary) hypertension: Secondary | ICD-10-CM | POA: Insufficient documentation

## 2023-10-12 DIAGNOSIS — E119 Type 2 diabetes mellitus without complications: Secondary | ICD-10-CM | POA: Diagnosis not present

## 2023-10-12 DIAGNOSIS — Z5112 Encounter for antineoplastic immunotherapy: Secondary | ICD-10-CM | POA: Diagnosis not present

## 2023-10-12 DIAGNOSIS — Z5111 Encounter for antineoplastic chemotherapy: Secondary | ICD-10-CM | POA: Diagnosis not present

## 2023-10-12 DIAGNOSIS — F17290 Nicotine dependence, other tobacco product, uncomplicated: Secondary | ICD-10-CM | POA: Diagnosis not present

## 2023-10-12 DIAGNOSIS — J90 Pleural effusion, not elsewhere classified: Secondary | ICD-10-CM | POA: Diagnosis not present

## 2023-10-12 DIAGNOSIS — D329 Benign neoplasm of meninges, unspecified: Secondary | ICD-10-CM | POA: Diagnosis not present

## 2023-10-12 DIAGNOSIS — M25552 Pain in left hip: Secondary | ICD-10-CM | POA: Diagnosis not present

## 2023-10-12 DIAGNOSIS — Z923 Personal history of irradiation: Secondary | ICD-10-CM | POA: Diagnosis not present

## 2023-10-12 LAB — CBC WITH DIFFERENTIAL (CANCER CENTER ONLY)
Abs Immature Granulocytes: 1.73 10*3/uL — ABNORMAL HIGH (ref 0.00–0.07)
Basophils Absolute: 0.2 10*3/uL — ABNORMAL HIGH (ref 0.0–0.1)
Basophils Relative: 0 %
Eosinophils Absolute: 0 10*3/uL (ref 0.0–0.5)
Eosinophils Relative: 0 %
HCT: 27.6 % — ABNORMAL LOW (ref 39.0–52.0)
Hemoglobin: 9 g/dL — ABNORMAL LOW (ref 13.0–17.0)
Immature Granulocytes: 5 %
Lymphocytes Relative: 5 %
Lymphs Abs: 2 10*3/uL (ref 0.7–4.0)
MCH: 34 pg (ref 26.0–34.0)
MCHC: 32.6 g/dL (ref 30.0–36.0)
MCV: 104.2 fL — ABNORMAL HIGH (ref 80.0–100.0)
Monocytes Absolute: 1.4 10*3/uL — ABNORMAL HIGH (ref 0.1–1.0)
Monocytes Relative: 4 %
Neutro Abs: 32.1 10*3/uL — ABNORMAL HIGH (ref 1.7–7.7)
Neutrophils Relative %: 86 %
Platelet Count: 117 10*3/uL — ABNORMAL LOW (ref 150–400)
RBC: 2.65 MIL/uL — ABNORMAL LOW (ref 4.22–5.81)
RDW: 21.4 % — ABNORMAL HIGH (ref 11.5–15.5)
Smear Review: NORMAL
WBC Count: 37.3 10*3/uL — ABNORMAL HIGH (ref 4.0–10.5)
nRBC: 0.7 % — ABNORMAL HIGH (ref 0.0–0.2)

## 2023-10-12 LAB — CMP (CANCER CENTER ONLY)
ALT: 15 U/L (ref 0–44)
AST: 18 U/L (ref 15–41)
Albumin: 3.6 g/dL (ref 3.5–5.0)
Alkaline Phosphatase: 241 U/L — ABNORMAL HIGH (ref 38–126)
Anion gap: 4 — ABNORMAL LOW (ref 5–15)
BUN: 31 mg/dL — ABNORMAL HIGH (ref 8–23)
CO2: 26 mmol/L (ref 22–32)
Calcium: 8.9 mg/dL (ref 8.9–10.3)
Chloride: 106 mmol/L (ref 98–111)
Creatinine: 1.49 mg/dL — ABNORMAL HIGH (ref 0.61–1.24)
GFR, Estimated: 50 mL/min — ABNORMAL LOW (ref >60)
Glucose, Bld: 90 mg/dL (ref 70–99)
Potassium: 4.8 mmol/L (ref 3.5–5.1)
Sodium: 136 mmol/L (ref 135–145)
Total Bilirubin: 0.7 mg/dL (ref 0.0–1.2)
Total Protein: 6.2 g/dL — ABNORMAL LOW (ref 6.5–8.1)

## 2023-10-12 LAB — SAMPLE TO BLOOD BANK

## 2023-10-12 MED ORDER — SODIUM CHLORIDE 0.9% FLUSH
10.0000 mL | INTRAVENOUS | Status: DC | PRN
  Administered 2023-10-12: 10 mL via INTRAVENOUS

## 2023-10-12 MED ORDER — HEPARIN SOD (PORK) LOCK FLUSH 100 UNIT/ML IV SOLN
500.0000 [IU] | Freq: Once | INTRAVENOUS | Status: AC
  Administered 2023-10-12: 500 [IU] via INTRAVENOUS

## 2023-10-13 ENCOUNTER — Telehealth: Payer: Self-pay

## 2023-10-13 ENCOUNTER — Encounter: Payer: Self-pay | Admitting: Internal Medicine

## 2023-10-13 DIAGNOSIS — C799 Secondary malignant neoplasm of unspecified site: Secondary | ICD-10-CM | POA: Diagnosis not present

## 2023-10-13 DIAGNOSIS — R531 Weakness: Secondary | ICD-10-CM | POA: Diagnosis not present

## 2023-10-13 DIAGNOSIS — C3490 Malignant neoplasm of unspecified part of unspecified bronchus or lung: Secondary | ICD-10-CM | POA: Diagnosis not present

## 2023-10-13 DIAGNOSIS — G893 Neoplasm related pain (acute) (chronic): Secondary | ICD-10-CM | POA: Diagnosis not present

## 2023-10-13 DIAGNOSIS — Z7409 Other reduced mobility: Secondary | ICD-10-CM | POA: Diagnosis not present

## 2023-10-13 DIAGNOSIS — I959 Hypotension, unspecified: Secondary | ICD-10-CM | POA: Diagnosis not present

## 2023-10-13 DIAGNOSIS — R42 Dizziness and giddiness: Secondary | ICD-10-CM | POA: Diagnosis not present

## 2023-10-13 DIAGNOSIS — D529 Folate deficiency anemia, unspecified: Secondary | ICD-10-CM | POA: Diagnosis not present

## 2023-10-13 DIAGNOSIS — D649 Anemia, unspecified: Secondary | ICD-10-CM | POA: Diagnosis not present

## 2023-10-13 NOTE — Telephone Encounter (Signed)
 Tried to reach patient in regards to lab results.  Per Providers- encourage patient to increase hydration.  LVM for return call with any questions.

## 2023-10-14 DIAGNOSIS — R638 Other symptoms and signs concerning food and fluid intake: Secondary | ICD-10-CM | POA: Diagnosis not present

## 2023-10-14 DIAGNOSIS — D696 Thrombocytopenia, unspecified: Secondary | ICD-10-CM | POA: Diagnosis not present

## 2023-10-14 DIAGNOSIS — D72829 Elevated white blood cell count, unspecified: Secondary | ICD-10-CM | POA: Diagnosis not present

## 2023-10-14 DIAGNOSIS — R531 Weakness: Secondary | ICD-10-CM | POA: Diagnosis not present

## 2023-10-14 DIAGNOSIS — D649 Anemia, unspecified: Secondary | ICD-10-CM | POA: Diagnosis not present

## 2023-10-15 DIAGNOSIS — R638 Other symptoms and signs concerning food and fluid intake: Secondary | ICD-10-CM | POA: Diagnosis not present

## 2023-10-15 DIAGNOSIS — J189 Pneumonia, unspecified organism: Secondary | ICD-10-CM | POA: Diagnosis not present

## 2023-10-15 DIAGNOSIS — R531 Weakness: Secondary | ICD-10-CM | POA: Diagnosis not present

## 2023-10-16 DIAGNOSIS — E860 Dehydration: Secondary | ICD-10-CM | POA: Diagnosis not present

## 2023-10-16 DIAGNOSIS — J189 Pneumonia, unspecified organism: Secondary | ICD-10-CM | POA: Diagnosis not present

## 2023-10-16 DIAGNOSIS — C799 Secondary malignant neoplasm of unspecified site: Secondary | ICD-10-CM | POA: Diagnosis not present

## 2023-10-16 DIAGNOSIS — E43 Unspecified severe protein-calorie malnutrition: Secondary | ICD-10-CM | POA: Diagnosis not present

## 2023-10-16 DIAGNOSIS — R531 Weakness: Secondary | ICD-10-CM | POA: Diagnosis not present

## 2023-10-16 DIAGNOSIS — Z7409 Other reduced mobility: Secondary | ICD-10-CM | POA: Diagnosis not present

## 2023-10-16 DIAGNOSIS — G893 Neoplasm related pain (acute) (chronic): Secondary | ICD-10-CM | POA: Diagnosis not present

## 2023-10-16 DIAGNOSIS — C3490 Malignant neoplasm of unspecified part of unspecified bronchus or lung: Secondary | ICD-10-CM | POA: Diagnosis not present

## 2023-10-17 DIAGNOSIS — L89150 Pressure ulcer of sacral region, unstageable: Secondary | ICD-10-CM | POA: Diagnosis not present

## 2023-10-17 DIAGNOSIS — D509 Iron deficiency anemia, unspecified: Secondary | ICD-10-CM | POA: Diagnosis not present

## 2023-10-17 DIAGNOSIS — C7951 Secondary malignant neoplasm of bone: Secondary | ICD-10-CM | POA: Diagnosis not present

## 2023-10-17 DIAGNOSIS — D6481 Anemia due to antineoplastic chemotherapy: Secondary | ICD-10-CM | POA: Diagnosis not present

## 2023-10-18 DIAGNOSIS — E43 Unspecified severe protein-calorie malnutrition: Secondary | ICD-10-CM | POA: Diagnosis not present

## 2023-10-18 DIAGNOSIS — Z7409 Other reduced mobility: Secondary | ICD-10-CM | POA: Diagnosis not present

## 2023-10-18 DIAGNOSIS — C799 Secondary malignant neoplasm of unspecified site: Secondary | ICD-10-CM | POA: Diagnosis not present

## 2023-10-18 DIAGNOSIS — I951 Orthostatic hypotension: Secondary | ICD-10-CM | POA: Diagnosis not present

## 2023-10-18 DIAGNOSIS — R638 Other symptoms and signs concerning food and fluid intake: Secondary | ICD-10-CM | POA: Diagnosis not present

## 2023-10-18 DIAGNOSIS — R531 Weakness: Secondary | ICD-10-CM | POA: Diagnosis not present

## 2023-10-18 NOTE — Progress Notes (Signed)
 [facility] OFFICE PROGRESS NOTE  Jonathan John, NP [address] [name] [address]  DIAGNOSIS: Stage IV (T3, N2, M1 C) non-small cell lung cancer, adenocarcinoma presented with large left upper lobe lung mass with left hilar and mediastinal invasion in addition to thoracic inlet lymphadenopathy as well as several metastatic bone lesions involving the spine, ribs as well as the right iliac bone diagnosed in December 2024.   Molecular studies by [id number] showed no actionable mutations and PD-L1 expression was negative.  PRIOR THERAPY:  1) radiation to L4 and L5 to the care of Dr. Lorri Rota. Final treatment  on 07/21/23  2) Left hip stress/pathologic intertrochanteric fracture open reduction and internal fixation with intramedullary hip screw under the care of Dr. Rozelle Corning on 07/25/23 3) Palliative systemic chemotherapy and immunotherapy with carboplatin  for an AUC of 5, Paclitaxol 175 mg/m, and Libtayo  IV every 3 weeks with Neulasta  support.  (Not a candidate for Alimta due to CKD).  First dose on 07/03/23.  Status post 3 cycles.  Last dose was given on August 28, 2023.  This treatment was discontinued secondary to disease progression.   CURRENT THERAPY:  1) second line systemic chemotherapy with docetaxel  75 Mg/M2 and ramucirumab  10 Mg/KG every 3 weeks with Neulasta  support.  First dose September 25, 2023.  Cyramza  will be removed from the care plan starting from cycle #2 due to subarachnoid hemorrhage   INTERVAL HISTORY: Jonathan Page 73 y.o. male returns to the clinic today for follow-up visit.  The patient was last seen by Dr. Marguerita Shih on 09/18/23.  At that point in time, Dr. Marguerita Shih changed the patient's treatment.  He had had a repeat CT scan prior to this appointment which unfortunately showed disease progression.  Therefore, Dr. Marguerita Shih changed him to second line chemotherapy with docetaxel  and Cyramza .  Unfortunately, the patient was recently in the hospital.  Patient came  into the clinic to receive his Neulasta  injection, the patient had a fall.  He did hit his head.    He was last seen in the clinic on 10/02/2023 by palliative care and was noted to have hypotension and weakness.  Given the recent fall and head injury there was concern for worsening brain hemorrhage.  He was subsequently sent to the emergency room and found to have worsening acute subarachnoid hemorrhage.  He was admitted to the hospital until 10/06/23.   He is currently at [facility] medical [facility].  He is not sure when he is expected to return home.  His hospitalization he had symptomatic anemia and received 1 unit of blood.  He remains weak and deconditioned.  Physical therapy evaluated him and recommended a skilled nursing facility.  Has been having a lot of ongoing lightheadedness with moving. He reports falls. He experiences dizziness, described as lightheadedness upon standing, which has contributed to falls both before and after his recent hospitalization. He attributes some of the dizziness to his ongoing anemia.  He experiences significant pain, which is his primary concern, affecting his ability to walk comfortably. Pain management is a key focus of his current care.  His orthopedic provider was considering him for steroid injection but wanted to make sure that did not interfere with his chemotherapy.  He reports constipation despite taking a stool softener, likely Colace, which has not been effective. No nausea, vomiting, diarrhea, or bleeding such as epistaxis, gum bleeding, or blood in the stool.  He consumes two to three nutritional drinks like Boost or Ensure daily  due to dissatisfaction with the food quality at the nursing facility. He has experienced weight loss, which he attributes to the poor quality of food. Despite this, his appetite is generally good. No fatigue, chills, night sweats, fevers, headaches, or vision changes.  He is scheduled to see palliative care today. He is here today for  evaluation repeat blood work before considering undergoing cycle #2.    MEDICAL HISTORY: Past Medical History:  Diagnosis Date   Allergy    Cancer (HCC)    Cellulitis 02/21/2018   Concussion syndrome    Diabetes (HCC)    Frequent headaches    Hypertension     ALLERGIES:  has no known allergies.  MEDICATIONS:  Current Outpatient Medications  Medication Sig Dispense Refill   Blood Glucose Monitoring Suppl (ACCU-CHEK AVIVA PLUS) w/Device KIT Check blood sugar before breakfast, lunch and bedtime and as directed. Dx E11.65 1 kit 0   Blood Glucose Monitoring Suppl DEVI 1 each by Does not apply route as directed. May substitute to any manufacturer covered by patient's insurance. 1 each 0   dexamethasone  (DECADRON ) 4 MG tablet Take 2 tabs by mouth 2 times daily starting day before chemo. Then take 2 tabs daily for 2 days starting day after chemo. Take with food. (Patient not taking: Reported on 10/02/2023) 30 tablet 1   diclofenac  Sodium (VOLTAREN ) 1 % GEL Apply 2 g topically 4 (four) times daily. (Patient not taking: Reported on 10/02/2023) 100 g 0   docusate sodium  (COLACE) 100 MG capsule Take 1 capsule (100 mg total) by mouth 2 (two) times daily. (Patient not taking: Reported on 10/02/2023) 10 capsule 0   feeding supplement (ENSURE ENLIVE / ENSURE PLUS) LIQD Take 237 mLs by mouth 2 (two) times daily between meals.     folic acid  (FOLVITE ) 1 MG tablet Take 1 mg by mouth in the morning. (Patient not taking: Reported on 10/02/2023)     gabapentin  (NEURONTIN ) 300 MG capsule Take 1 capsule (300 mg total) by mouth at bedtime. (Patient taking differently: Take 300 mg by mouth in the morning.) 30 capsule 1   glipiZIDE  (GLUCOTROL  XL) 10 MG 24 hr tablet Take 1 tablet (10 mg total) by mouth daily with breakfast. for diabetes. 90 tablet 3   glucose blood (ACCU-CHEK AVIVA PLUS) test strip Check blood sugar before breakfast, lunch and bedtime and as directed. Dx E11.65 300 each 1   insulin  NPH-regular Human  (70-30) 100 UNIT/ML injection Inject 5 Units into the skin daily with breakfast.     Lancets (ACCU-CHEK SOFT TOUCH) lancets Check blood sugar before breakfast, lunch and bedtime and as directed. Dx E11.65 300 each 1   lidocaine -prilocaine  (EMLA ) cream Apply 1 Application topically as needed. 30 g 1   lisinopril  (ZESTRIL ) 10 MG tablet Take 1 tablet (10 mg total) by mouth daily. for blood pressure. 90 tablet 0   methocarbamol  (ROBAXIN ) 500 MG tablet Take 1 tablet (500 mg total) by mouth every 8 (eight) hours as needed for muscle spasms. 30 tablet 0   ondansetron  (ZOFRAN ) 8 MG tablet Take 8 mg by mouth every 8 (eight) hours as needed for nausea or vomiting.     oxyCODONE  (OXY IR/ROXICODONE ) 5 MG immediate release tablet Take 1 tablet (5 mg total) by mouth every 8 (eight) hours as needed for up to 4 doses for moderate pain (pain score 4-6). 4 tablet 0   prochlorperazine  (COMPAZINE ) 10 MG tablet Take 1 tablet (10 mg total) by mouth every 6 (six) hours as needed  for nausea or vomiting. 30 tablet 0   No current facility-administered medications for this visit.    SURGICAL HISTORY:  Past Surgical History:  Procedure Laterality Date   APPENDECTOMY  1960   BRONCHIAL BIOPSY  06/12/2023   Procedure: BRONCHIAL BIOPSIES;  Surgeon: Prudy Brownie, DO;  Location: [facility];  Service: Cardiopulmonary;;   BRONCHIAL BRUSHINGS  06/12/2023   Procedure: BRONCHIAL BRUSHINGS;  Surgeon: Prudy Brownie, DO;  Location: [facility];  Service: Cardiopulmonary;;   COLONOSCOPY     FEMUR IM NAIL Left 07/25/2023   Procedure: LEFT HIP INTRAMEDULLARY HIP SCREW;  Surgeon: Jasmine Mesi, MD;  Location: [facility] OR;  Service: Orthopedics;  Laterality: Left;   HIP SURGERY Left 07/25/2023   metal pin placed in L hip area   IR IMAGING GUIDED PORT INSERTION  07/13/2023   VIDEO BRONCHOSCOPY Bilateral 06/12/2023   Procedure: VIDEO BRONCHOSCOPY;  Surgeon: Prudy Brownie, DO;  Location: [facility];  Service:  Cardiopulmonary;  Laterality: Bilateral;   WISDOM TOOTH EXTRACTION      REVIEW OF SYSTEMS:   Review of Systems  Constitutional: Stated for fatigue, decreased appetite, weight loss, and generalized weakness.  Negative for chills and fever .  HENT:   Negative for mouth sores, nosebleeds, sore throat and trouble swallowing.   Eyes: Negative for eye problems and icterus.  Respiratory: Negative for cough, hemoptysis, shortness of breath and wheezing.   Cardiovascular: Negative for chest pain and leg swelling.  Gastrointestinal: Positive for constipation. Negative for abdominal pain, diarrhea, nausea and vomiting.  Genitourinary: Negative for bladder incontinence, difficulty urinating, dysuria, frequency and hematuria.   Musculoskeletal: Negative for back pain, gait problem, neck pain and neck stiffness.  Skin: Negative for itching and rash.  Neurological: positive for gait changes, lightheadedness, and generalized weakness. Negative for extremity weakness, headaches, and seizures.  Hematological: Negative for adenopathy. Does not bruise/bleed easily.  Psychiatric/Behavioral: Negative for confusion, depression and sleep disturbance. The patient is not nervous/anxious.     PHYSICAL EXAMINATION:  There were no vitals taken for this visit.  ECOG PERFORMANCE STATUS: 3  Physical Exam  Constitutional: Oriented to person, place, and time and chronically ill-appearing male, and in no distress.  HENT:  Head: Normocephalic and atraumatic.  Mouth/Throat: Oropharynx is clear and moist. No oropharyngeal exudate.  Eyes: Conjunctivae are normal. Right eye exhibits no discharge. Left eye exhibits no discharge. No scleral icterus.  Neck: Normal range of motion. Neck supple.  Cardiovascular: Normal rate, regular rhythm, normal heart sounds and intact distal pulses.   Pulmonary/Chest: Effort normal and breath sounds normal. No respiratory distress. No wheezes. No rales.  Abdominal: Soft. Bowel sounds are  normal. Exhibits no distension and no mass. There is no tenderness.  Musculoskeletal: Normal range of motion. Exhibits no edema.  Lymphadenopathy:    No cervical adenopathy.  Neurological: Alert and oriented to person, place, and time. Exhibits no wasting.  The patient was examined in the wheelchair.  Skin: Skin is warm and dry. No rash noted. Not diaphoretic. No erythema. No pallor.  Psychiatric: Mood, memory and judgment normal.  Vitals reviewed.  LABORATORY DATA: Lab Results  Component Value Date   WBC 37.3 (H) 10/12/2023   HGB 9.0 (L) 10/12/2023   HCT 27.6 (L) 10/12/2023   MCV 104.2 (H) 10/12/2023   PLT 117 (L) 10/12/2023      Chemistry      Component Value Date/Time   NA 136 10/12/2023 1537   K 4.8 10/12/2023 1537   CL 106 10/12/2023  1537   CO2 26 10/12/2023 1537   BUN 31 (H) 10/12/2023 1537   CREATININE 1.49 (H) 10/12/2023 1537      Component Value Date/Time   CALCIUM  8.9 10/12/2023 1537   ALKPHOS 241 (H) 10/12/2023 1537   AST 18 10/12/2023 1537   ALT 15 10/12/2023 1537   BILITOT 0.7 10/12/2023 1537       RADIOGRAPHIC STUDIES:  DG Chest Port 1 View Result Date: 10/05/2023 CLINICAL DATA:  Chest pain EXAM: PORTABLE CHEST 1 VIEW COMPARISON:  October 02, 2023 FINDINGS: No change in the right IJ Infuse-A-Port catheter Persistent improving left lung pneumonia infiltrates. With some infiltrates of the right lung as well without consolidations. Small left effusion Heart normal size IMPRESSION: Improving left lung pneumonia. Electronically Signed   By: Fredrich Jefferson M.D.   On: 10/05/2023 13:31   CT Head Wo Contrast Result Date: 10/03/2023 CLINICAL DATA:  Subarachnoid hemorrhage follow-up EXAM: CT HEAD WITHOUT CONTRAST TECHNIQUE: Contiguous axial images were obtained from the base of the skull through the vertex without intravenous contrast. RADIATION DOSE REDUCTION: This exam was performed according to the departmental dose-optimization program which includes automated exposure  control, adjustment of the mA and/or kV according to patient size and/or use of iterative reconstruction technique. COMPARISON:  Yesterday FINDINGS: Brain: Similar pattern and degree of patchy subarachnoid hemorrhage scattered along bilateral sulci, up to 7 mm in thickness at the left vertex. Trace left parafalcine subdural hemorrhage is noted along the posterior falx, stable. No evidence of infarct, hydrocephalus, or brain edema. There is cerebral volume loss with mild low-density in the cerebral white matter. No evidence of acute infarct. Vascular: No hyperdense vessel or unexpected calcification. Skull: Lytic appearance at the right occipital condyle, known osseous metastatic disease by prior report. Sinuses/Orbits: No acute finding IMPRESSION: Unchanged patchy subarachnoid hemorrhage along the bilateral cerebral convexity when compared to yesterday. Trace left parafalcine subdural hematoma shows no interval thickening. Electronically Signed   By: Ronnette Coke M.D.   On: 10/03/2023 05:39   DG Pelvis Portable Result Date: 10/02/2023 CLINICAL DATA:  Fall with left hip pain. EXAM: PORTABLE PELVIS 1-2 VIEWS COMPARISON:  CT 09/11/2023 FINDINGS: IM rod and screw fixation left femur. No acute fracture or dislocation. Permeative of appearance of the medial cortex of the proximal left femur suspicious for metastases. IMPRESSION: No acute fracture or dislocation. Permeative of appearance of the medial cortex of the proximal left femur suspicious for metastases. Electronically Signed   By: Rozell Cornet M.D.   On: 10/02/2023 20:44   DG Chest Portable 1 View Result Date: 10/02/2023 CLINICAL DATA:  Lung cancer. EXAM: PORTABLE CHEST 1 VIEW COMPARISON:  None Available. FINDINGS: Unremarkable cardiac size. Extensive alveolar opacification left mid to lower lung consistent with pneumonia. Left-sided moderate pleural effusion. No pneumothorax. There is small area of alveolar opacity in the right mid hemithorax.  Right-sided Port-A-Cath tip overlies distal SVC. IMPRESSION: Bilateral pulmonary opacities, left greater than right consistent with pneumonia, edema or mass. Left-sided effusion. Electronically Signed   By: Sydell Eva M.D.   On: 10/02/2023 20:40   CT Head Wo Contrast Addendum Date: 10/02/2023 ADDENDUM REPORT: 10/02/2023 20:36 ADDENDUM: Critical Value/emergent results were called by telephone at the time of interpretation on 10/02/2023 at 8:36 pm to provider [facility] , who verbally acknowledged these results. Electronically Signed   By: Juanetta Nordmann M.D.   On: 10/02/2023 20:36   Result Date: 10/02/2023 CLINICAL DATA:  Fall EXAM: CT HEAD WITHOUT CONTRAST CT CERVICAL SPINE WITHOUT CONTRAST  TECHNIQUE: Multidetector CT imaging of the head and cervical spine was performed following the standard protocol without intravenous contrast. Multiplanar CT image reconstructions of the cervical spine were also generated. RADIATION DOSE REDUCTION: This exam was performed according to the departmental dose-optimization program which includes automated exposure control, adjustment of the mA and/or kV according to patient size and/or use of iterative reconstruction technique. COMPARISON:  09/30/2023 FINDINGS: CT HEAD FINDINGS Brain: Multifocal acute subarachnoid hemorrhage over both hemispheres, greatest at the right sylvian fissure. The amount of hemorrhage is worsened since the prior examination. No midline shift or other mass effect. Confluent white matter hypoattenuation. Mild volume loss. Vascular: No hyperdense vessel or unexpected calcification. Skull: No fracture. Sinuses/Orbits: No acute finding. Other: None. CT CERVICAL SPINE FINDINGS Alignment: Normal. Skull base and vertebrae: There is widespread osseous metastatic disease affecting the ribs, cervical spine and lower skull base. Unchanged appearance of pathologic fractures at T1 and C6. No new fracture. Soft tissues and spinal canal: Hyperdense foci within  the left subarticular zone at the C1-2 level. Disc levels:  No high-grade spinal canal stenosis. Upper chest: 6 mm right apical pulmonary nodule. Incompletely visualized left pleural effusion. Other: None IMPRESSION: 1. Multifocal acute subarachnoid hemorrhage over both hemispheres, greatest at the right Sylvian fissure. The amount of hemorrhage is worsened since the prior examination. No midline shift or other mass effect. 2. Widespread osseous metastatic disease affecting the ribs, cervical spine and lower skull base. Unchanged appearance of pathologic fractures at T1 and C6. No new fracture. 3. Unchanged hyperdense foci within the left subarticular zone at the C1-2 level, favored to be a meningioma. 4. Incompletely visualized left pleural effusion. 5. Unchanged 6 mm right apical pulmonary nodule. Electronically Signed: By: Juanetta Nordmann M.D. On: 10/02/2023 20:27   CT Cervical Spine Wo Contrast Addendum Date: 10/02/2023 ADDENDUM REPORT: 10/02/2023 20:36 ADDENDUM: Critical Value/emergent results were called by telephone at the time of interpretation on 10/02/2023 at 8:36 pm to provider [facility] , who verbally acknowledged these results. Electronically Signed   By: Juanetta Nordmann M.D.   On: 10/02/2023 20:36   Result Date: 10/02/2023 CLINICAL DATA:  Fall EXAM: CT HEAD WITHOUT CONTRAST CT CERVICAL SPINE WITHOUT CONTRAST TECHNIQUE: Multidetector CT imaging of the head and cervical spine was performed following the standard protocol without intravenous contrast. Multiplanar CT image reconstructions of the cervical spine were also generated. RADIATION DOSE REDUCTION: This exam was performed according to the departmental dose-optimization program which includes automated exposure control, adjustment of the mA and/or kV according to patient size and/or use of iterative reconstruction technique. COMPARISON:  09/30/2023 FINDINGS: CT HEAD FINDINGS Brain: Multifocal acute subarachnoid hemorrhage over both hemispheres,  greatest at the right sylvian fissure. The amount of hemorrhage is worsened since the prior examination. No midline shift or other mass effect. Confluent white matter hypoattenuation. Mild volume loss. Vascular: No hyperdense vessel or unexpected calcification. Skull: No fracture. Sinuses/Orbits: No acute finding. Other: None. CT CERVICAL SPINE FINDINGS Alignment: Normal. Skull base and vertebrae: There is widespread osseous metastatic disease affecting the ribs, cervical spine and lower skull base. Unchanged appearance of pathologic fractures at T1 and C6. No new fracture. Soft tissues and spinal canal: Hyperdense foci within the left subarticular zone at the C1-2 level. Disc levels:  No high-grade spinal canal stenosis. Upper chest: 6 mm right apical pulmonary nodule. Incompletely visualized left pleural effusion. Other: None IMPRESSION: 1. Multifocal acute subarachnoid hemorrhage over both hemispheres, greatest at the right Sylvian fissure. The amount of hemorrhage is worsened  since the prior examination. No midline shift or other mass effect. 2. Widespread osseous metastatic disease affecting the ribs, cervical spine and lower skull base. Unchanged appearance of pathologic fractures at T1 and C6. No new fracture. 3. Unchanged hyperdense foci within the left subarticular zone at the C1-2 level, favored to be a meningioma. 4. Incompletely visualized left pleural effusion. 5. Unchanged 6 mm right apical pulmonary nodule. Electronically Signed: By: Juanetta Nordmann M.D. On: 10/02/2023 20:27   CT Head Wo Contrast Result Date: 09/30/2023 CLINICAL DATA:  6 hour repeat head CT for subarachnoid hemorrhage identified this morning after a fall. EXAM: CT HEAD WITHOUT CONTRAST TECHNIQUE: Contiguous axial images were obtained from the base of the skull through the vertex without intravenous contrast. RADIATION DOSE REDUCTION: This exam was performed according to the departmental dose-optimization program which includes  automated exposure control, adjustment of the mA and/or kV according to patient size and/or use of iterative reconstruction technique. COMPARISON:  CT head 09/30/2023 at 10:17 a.m. FINDINGS: Brain: Subarachnoid hemorrhage overlying the left cerebral hemisphere is redemonstrated. The largest focus of hemorrhage overlies the left frontal/parietal convexity (series 2/image 29) and measures 2.8 x 1.5 cm, previously 2.1 x 1.3 cm using similar measuring technique. Minimal if any adjacent mass effect. Smaller foci overlying the posterior left parietal lobe have also increased. For example the largest measures 8 mm (series 2/image 25), previously 5 mm. Tiny focus of hemorrhage over the right parietal convexity near the vertex (series 5/image 41 is unchanged. Stable ventricular caliber. No midline shift. No evidence of acute infarct. Vascular: No hyperdense vessel or unexpected calcification. Skull: No calvarial fracture.  Left posterior scalp hematoma. Sinuses/Orbits: No acute abnormality. Other: None. IMPRESSION: 1. Foci of subarachnoid hemorrhage overlying the left cerebral hemisphere have mildly increased in size since earlier today. Minimal if any adjacent mass effect. No midline shift. 2. Tiny focus of hemorrhage over the right parietal convexity near the vertex is unchanged. Electronically Signed   By: Rozell Cornet M.D.   On: 09/30/2023 17:12   DG Hip Unilat W or Wo Pelvis 2-3 Views Left Result Date: 09/30/2023 CLINICAL DATA:  Status post fall. EXAM: DG HIP (WITH OR WITHOUT PELVIS) 2-3V LEFT COMPARISON:  CT from 09/11/23 FINDINGS: Again seen are postsurgical changes from IM nail and cannulated screw placement within the proximal femur and hip. Signs of diffuse, lytic osseous metastases are again noted involving the bony pelvis and proximal left femur. Heterotopic bone is again seen overlying the greater trochanter of the left proximal femur. There are no signs of acute fracture or dislocation. Spondylosis within  the lower lumbar spine. IMPRESSION: 1. No acute findings. 2. Signs of diffuse osseous metastases. 3. Postsurgical changes from IM nail and cannulated screw placement within the proximal left femur and hip. Electronically Signed   By: Kimberley Penman M.D.   On: 09/30/2023 11:23   CT Head Wo Contrast Result Date: 09/30/2023 CLINICAL DATA:  Status post fall. EXAM: CT HEAD WITHOUT CONTRAST CT CERVICAL SPINE WITHOUT CONTRAST TECHNIQUE: Multidetector CT imaging of the head and cervical spine was performed following the standard protocol without intravenous contrast. Multiplanar CT image reconstructions of the cervical spine were also generated. RADIATION DOSE REDUCTION: This exam was performed according to the departmental dose-optimization program which includes automated exposure control, adjustment of the mA and/or kV according to patient size and/or use of iterative reconstruction technique. COMPARISON:  Brain MRI 07/08/2023 FINDINGS: CT HEAD FINDINGS Brain: Multiple areas of subarachnoid hyperdensity are noted overlying the left cerebral  hemisphere. The largest overlies the left parietal convexity measuring 1.5 by 1.1 cm, image 29/6. Over the left parietal convexity there are 2 smaller foci of subarachnoid hyperdensity which measure around 5-6 mm, image 27/3. Along the left posterior parietal lobe there is focal area of subarachnoid hyperdensity, image 25/6. Tiny focus of hyperdensity over the right parietal convexity is also noted, image 29/6. No midline shift.  No intraventricular hemorrhage or hydrocephalus. No subdural hematoma identified. No signs of acute brain infarct. There is mild diffuse low-attenuation within the subcortical and periventricular white matter compatible with chronic microvascular disease. Prominence of the sulci and ventricles compatible with brain atrophy. Vascular: No hyperdense vessel or unexpected calcification. Skull: No acute fracture. Lucent bone lesions within the right-side of the  base of skull is noted, image 25/9. Sinuses/Orbits: No acute finding. Other: None. CT CERVICAL SPINE FINDINGS Alignment: Normal. Skull base and vertebrae: Permeative appearance of the cervical vertebra and skull base identified compatible known diffuse lytic bone metastases. There is an age indeterminate fracture deformity involving the left side of the T1 vertebra, image 43/7. Similarly within the left side of the C6 vertebra there is an expansile lytic lesion with pathologic fracture through the superior endplate and left lateral mass, image 30/5. Soft tissues and spinal canal: No prevertebral fluid or swelling. No visible canal hematoma. Disc levels:  Multilevel disc space narrowing and endplate spurring. Upper chest: Partially visualized tumor encasing the left upper lobe is identified. Extensive lytic bone metastases are identified involving the visualized portions of the ribs and upper thoracic spine. Multiple pulmonary nodules identified within the right lung. Other: None IMPRESSION: 1. Multiple areas of subarachnoid hyperdensity are noted overlying the left cerebral hemisphere. The largest overlies the left parietal convexity measuring 1.5 x 1.1 cm. Findings are compatible with acute subarachnoid hemorrhage. 2. Tiny focus of hyperdensity over the right parietal convexity is also noted. This may represent a tiny focus of subarachnoid hemorrhage. 3. Permeative appearance of the cervical vertebra and skull base compatible with known diffuse lytic bone metastases. 4. Age indeterminate pathologic fractures involve the C6 and T1 vertebra. No significant vertebral body height loss at these levels. If there is a clinical concern for tumor involvement of the cervical canal then a contrast enhanced MRI of the cervical spine would be advised. 5. Partially visualized tumor encasing the left upper lobe is identified. Extensive lytic bone metastases are identified involving the visualized portions of the ribs and upper  thoracic spine. Multiple pulmonary nodules identified within the right lung. 6. Chronic microvascular disease and brain atrophy. Critical Value/emergent results were called by telephone at the time of interpretation on 09/30/2023 at 11:00 am to provider [facility] , who verbally acknowledged these results. Electronically Signed   By: Kimberley Penman M.D.   On: 09/30/2023 11:01   CT Cervical Spine Wo Contrast Result Date: 09/30/2023 CLINICAL DATA:  Status post fall. EXAM: CT HEAD WITHOUT CONTRAST CT CERVICAL SPINE WITHOUT CONTRAST TECHNIQUE: Multidetector CT imaging of the head and cervical spine was performed following the standard protocol without intravenous contrast. Multiplanar CT image reconstructions of the cervical spine were also generated. RADIATION DOSE REDUCTION: This exam was performed according to the departmental dose-optimization program which includes automated exposure control, adjustment of the mA and/or kV according to patient size and/or use of iterative reconstruction technique. COMPARISON:  Brain MRI 07/08/2023 FINDINGS: CT HEAD FINDINGS Brain: Multiple areas of subarachnoid hyperdensity are noted overlying the left cerebral hemisphere. The largest overlies the left parietal convexity measuring 1.5  by 1.1 cm, image 29/6. Over the left parietal convexity there are 2 smaller foci of subarachnoid hyperdensity which measure around 5-6 mm, image 27/3. Along the left posterior parietal lobe there is focal area of subarachnoid hyperdensity, image 25/6. Tiny focus of hyperdensity over the right parietal convexity is also noted, image 29/6. No midline shift.  No intraventricular hemorrhage or hydrocephalus. No subdural hematoma identified. No signs of acute brain infarct. There is mild diffuse low-attenuation within the subcortical and periventricular white matter compatible with chronic microvascular disease. Prominence of the sulci and ventricles compatible with brain atrophy. Vascular: No  hyperdense vessel or unexpected calcification. Skull: No acute fracture. Lucent bone lesions within the right-side of the base of skull is noted, image 25/9. Sinuses/Orbits: No acute finding. Other: None. CT CERVICAL SPINE FINDINGS Alignment: Normal. Skull base and vertebrae: Permeative appearance of the cervical vertebra and skull base identified compatible known diffuse lytic bone metastases. There is an age indeterminate fracture deformity involving the left side of the T1 vertebra, image 43/7. Similarly within the left side of the C6 vertebra there is an expansile lytic lesion with pathologic fracture through the superior endplate and left lateral mass, image 30/5. Soft tissues and spinal canal: No prevertebral fluid or swelling. No visible canal hematoma. Disc levels:  Multilevel disc space narrowing and endplate spurring. Upper chest: Partially visualized tumor encasing the left upper lobe is identified. Extensive lytic bone metastases are identified involving the visualized portions of the ribs and upper thoracic spine. Multiple pulmonary nodules identified within the right lung. Other: None IMPRESSION: 1. Multiple areas of subarachnoid hyperdensity are noted overlying the left cerebral hemisphere. The largest overlies the left parietal convexity measuring 1.5 x 1.1 cm. Findings are compatible with acute subarachnoid hemorrhage. 2. Tiny focus of hyperdensity over the right parietal convexity is also noted. This may represent a tiny focus of subarachnoid hemorrhage. 3. Permeative appearance of the cervical vertebra and skull base compatible with known diffuse lytic bone metastases. 4. Age indeterminate pathologic fractures involve the C6 and T1 vertebra. No significant vertebral body height loss at these levels. If there is a clinical concern for tumor involvement of the cervical canal then a contrast enhanced MRI of the cervical spine would be advised. 5. Partially visualized tumor encasing the left upper  lobe is identified. Extensive lytic bone metastases are identified involving the visualized portions of the ribs and upper thoracic spine. Multiple pulmonary nodules identified within the right lung. 6. Chronic microvascular disease and brain atrophy. Critical Value/emergent results were called by telephone at the time of interpretation on 09/30/2023 at 11:00 am to provider [facility] , who verbally acknowledged these results. Electronically Signed   By: Kimberley Penman M.D.   On: 09/30/2023 11:01     ASSESSMENT/PLAN:  This is a very pleasant 73 year old African-American male diagnosed with stage IV (T3, N2, M1 C) non-small cell lung cancer, adenocarcinoma.  He presented with a left upper lobe lung mass with left hilar mediastinal invasion in addition to thoracic inlet lymphadenopathy as well as several metastatic bone lesions involving the spine, ribs, and right iliac bone.  The patient was diagnosed in December 2024.  His staging PET scan is scheduled for later this week on 07/05/2023.  His brain MRI is pending.   His molecular studies by [employer] showed no actionable mutations and his PD-L1 expression is negative.   He completed palliative radiation to L4/L5 to the care of Dr. Lorri Rota in early February 2025. He also under went surgery on  his hip due to the impending fracture on 07/25/23 under the care of Dr. Rozelle Corning.   He was then treated with palliative systemic chemotherapy and immunotherapy with carboplatin  for an AUC of 5, Paclitaxol 175 mg/m, and Libtayo  IV every 3 weeks with Neulasta  support.  (Not a candidate for Alimta due to CKD).  First dose on 07/03/23.  Status post 3 cycles.  Last dose was given on August 28, 2023.  This treatment was discontinued secondary to disease progression.   Patient is currently on second line chemotherapy with docetaxel  and Cyramza  with Neulasta  support IV every 3 weeks.  The first dose was on 09/25/23.  Patient is status post 1 cycle.  Unfortunately the patient  was recently hospitalized for subarachnoid hemorrhage after a fall.  He is currently in a skilled nursing facility.  The patient was seen with Dr. Marguerita Shih today.  Labs were reviewed.  Hemoglobin is 7.8.  The patient is recovering from his recent hospitalization.  Very deconditioned.  We have deferred his care plan by 1 week.  He will not receive treatment today.  Instead we will arrange for 2 units of blood due to symptomatic anemia.  I have added iron  studies, B12, and folate to his labs next week to ensure no nutritional deficiency contributing to his anemia.  The patient denies any visible bleeding.  Dr. Marguerita Shih recommends removing Cyramza  from the care plan due to his recent subarachnoid hemorrhage.  He is scheduled to see palliative care today for pain management which is affecting his mobility.  I let the patient know it is okay to proceed with a steroid shot to help with his joint pain per orthopedics.  We will continue to monitor his labs closely on a weekly basis.  I have placed standing orders for sample of blood bank.  We will consider blood transfusion if his hemoglobin were less than 8.  Constipation Constipation likely due to pain medication. Current stool softener ineffective. - Recommend addition of a laxative if no bowel movement occurs daily or every other day.  Goals of Care Palliative treatment with no curative intent. Documented DNR order, no resuscitation preferred.  Follow-up Follow-up needed to assess response to interventions and adjust treatment. Coordination with palliative care essential. - Schedule follow-up appointment next week to reassess condition. - Coordinate with palliative care for ongoing management. - Ensure summary of visit is provided to nursing facility.   The patient was advised to call immediately if he has any concerning symptoms in the interval. The patient voices understanding of current disease status and treatment options and is in  agreement with the current care plan. All questions were answered. The patient knows to call the clinic with any problems, questions or concerns. We can certainly see the patient much sooner if necessary    No orders of the defined types were placed in this encounter.     Moises Terpstra L Derec Mozingo, PA-C 10/18/23  ADDENDUM: Hematology/Oncology Attending:  I had the face-to-face encounter with the patient today.  I reviewed his records, lab, and recommended his care plan.  This is a very pleasant 74 years old African-American male with a stage IV non-small cell lung cancer, adenocarcinoma diagnosed in December 2024.  His molecular studies showed no actionable mutations with negative PD-L1 expression.  He started first-line treatment with carboplatin , paclitaxel  and Keytruda for 3 cycles.  Unfortunately the patient had evidence for disease progression after the third cycle of his treatment. He started second line treatment with docetaxel  and ramucirumab  status  post 1 cycle.  He has a rough time after the first cycle of his treatment and was admitted to the hospital after a fall and was found to have hypotension and weakness.  He had head injury after the fall resulting acute subarachnoid hemorrhage.  He is currently a resident of a skilled nursing facility. The patient is not feeling well today. I recommended for him to delay the start of cycle #2 by at least 1 week. For the chemotherapy-induced anemia, we will arrange for the patient to receive 2 units of PRBCs transfusion. We will discontinue his treatment with ramucirumab  secondary to the recent subarachnoid hemorrhage. The patient was advised to call immediately if he has any concerning symptoms in the interval. The total time spent in the appointment was 30 minutes including review of chart and various tests results, discussions about plan of care and coordination of care plan .  Disclaimer: This note was dictated with voice recognition  software. Similar sounding words can inadvertently be transcribed and may be missed upon review.  Aurelio Blower, MD

## 2023-10-19 ENCOUNTER — Inpatient Hospital Stay

## 2023-10-19 ENCOUNTER — Inpatient Hospital Stay (HOSPITAL_BASED_OUTPATIENT_CLINIC_OR_DEPARTMENT_OTHER): Admitting: Nurse Practitioner

## 2023-10-19 ENCOUNTER — Inpatient Hospital Stay (HOSPITAL_BASED_OUTPATIENT_CLINIC_OR_DEPARTMENT_OTHER): Admitting: Physician Assistant

## 2023-10-19 ENCOUNTER — Encounter: Payer: Self-pay | Admitting: Nurse Practitioner

## 2023-10-19 VITALS — BP 98/61 | HR 105 | Temp 98.2°F | Resp 16 | Ht 73.0 in

## 2023-10-19 DIAGNOSIS — M25552 Pain in left hip: Secondary | ICD-10-CM | POA: Diagnosis not present

## 2023-10-19 DIAGNOSIS — C7951 Secondary malignant neoplasm of bone: Secondary | ICD-10-CM

## 2023-10-19 DIAGNOSIS — Z95828 Presence of other vascular implants and grafts: Secondary | ICD-10-CM

## 2023-10-19 DIAGNOSIS — R530 Neoplastic (malignant) related fatigue: Secondary | ICD-10-CM

## 2023-10-19 DIAGNOSIS — G893 Neoplasm related pain (acute) (chronic): Secondary | ICD-10-CM | POA: Diagnosis not present

## 2023-10-19 DIAGNOSIS — Z5189 Encounter for other specified aftercare: Secondary | ICD-10-CM | POA: Diagnosis not present

## 2023-10-19 DIAGNOSIS — Z515 Encounter for palliative care: Secondary | ICD-10-CM | POA: Diagnosis not present

## 2023-10-19 DIAGNOSIS — C3490 Malignant neoplasm of unspecified part of unspecified bronchus or lung: Secondary | ICD-10-CM | POA: Diagnosis not present

## 2023-10-19 DIAGNOSIS — D649 Anemia, unspecified: Secondary | ICD-10-CM

## 2023-10-19 DIAGNOSIS — Z5112 Encounter for antineoplastic immunotherapy: Secondary | ICD-10-CM | POA: Diagnosis not present

## 2023-10-19 DIAGNOSIS — Z5111 Encounter for antineoplastic chemotherapy: Secondary | ICD-10-CM | POA: Diagnosis not present

## 2023-10-19 DIAGNOSIS — E119 Type 2 diabetes mellitus without complications: Secondary | ICD-10-CM | POA: Diagnosis not present

## 2023-10-19 DIAGNOSIS — K5903 Drug induced constipation: Secondary | ICD-10-CM | POA: Diagnosis not present

## 2023-10-19 DIAGNOSIS — Z66 Do not resuscitate: Secondary | ICD-10-CM | POA: Diagnosis not present

## 2023-10-19 DIAGNOSIS — C3412 Malignant neoplasm of upper lobe, left bronchus or lung: Secondary | ICD-10-CM | POA: Diagnosis not present

## 2023-10-19 DIAGNOSIS — D509 Iron deficiency anemia, unspecified: Secondary | ICD-10-CM

## 2023-10-19 DIAGNOSIS — K5900 Constipation, unspecified: Secondary | ICD-10-CM | POA: Diagnosis not present

## 2023-10-19 LAB — CBC WITH DIFFERENTIAL (CANCER CENTER ONLY)
Abs Immature Granulocytes: 0.88 10*3/uL — ABNORMAL HIGH (ref 0.00–0.07)
Basophils Absolute: 0.1 10*3/uL (ref 0.0–0.1)
Basophils Relative: 0 %
Eosinophils Absolute: 0 10*3/uL (ref 0.0–0.5)
Eosinophils Relative: 0 %
HCT: 24.4 % — ABNORMAL LOW (ref 39.0–52.0)
Hemoglobin: 7.8 g/dL — ABNORMAL LOW (ref 13.0–17.0)
Immature Granulocytes: 4 %
Lymphocytes Relative: 6 %
Lymphs Abs: 1.6 10*3/uL (ref 0.7–4.0)
MCH: 34.4 pg — ABNORMAL HIGH (ref 26.0–34.0)
MCHC: 32 g/dL (ref 30.0–36.0)
MCV: 107.5 fL — ABNORMAL HIGH (ref 80.0–100.0)
Monocytes Absolute: 0.9 10*3/uL (ref 0.1–1.0)
Monocytes Relative: 4 %
Neutro Abs: 22 10*3/uL — ABNORMAL HIGH (ref 1.7–7.7)
Neutrophils Relative %: 86 %
Platelet Count: 120 10*3/uL — ABNORMAL LOW (ref 150–400)
RBC: 2.27 MIL/uL — ABNORMAL LOW (ref 4.22–5.81)
RDW: 21.5 % — ABNORMAL HIGH (ref 11.5–15.5)
WBC Count: 25.4 10*3/uL — ABNORMAL HIGH (ref 4.0–10.5)
nRBC: 0.8 % — ABNORMAL HIGH (ref 0.0–0.2)

## 2023-10-19 LAB — CMP (CANCER CENTER ONLY)
ALT: 8 U/L (ref 0–44)
AST: 12 U/L — ABNORMAL LOW (ref 15–41)
Albumin: 3.5 g/dL (ref 3.5–5.0)
Alkaline Phosphatase: 159 U/L — ABNORMAL HIGH (ref 38–126)
Anion gap: 9 (ref 5–15)
BUN: 23 mg/dL (ref 8–23)
CO2: 23 mmol/L (ref 22–32)
Calcium: 8.9 mg/dL (ref 8.9–10.3)
Chloride: 109 mmol/L (ref 98–111)
Creatinine: 1.32 mg/dL — ABNORMAL HIGH (ref 0.61–1.24)
GFR, Estimated: 57 mL/min — ABNORMAL LOW (ref >60)
Glucose, Bld: 172 mg/dL — ABNORMAL HIGH (ref 70–99)
Potassium: 3.9 mmol/L (ref 3.5–5.1)
Sodium: 141 mmol/L (ref 135–145)
Total Bilirubin: 0.5 mg/dL (ref 0.0–1.2)
Total Protein: 6.1 g/dL — ABNORMAL LOW (ref 6.5–8.1)

## 2023-10-19 LAB — SAMPLE TO BLOOD BANK

## 2023-10-19 LAB — PREPARE RBC (CROSSMATCH)

## 2023-10-19 MED ORDER — DIPHENHYDRAMINE HCL 25 MG PO CAPS
25.0000 mg | ORAL_CAPSULE | Freq: Once | ORAL | Status: AC
  Administered 2023-10-19: 25 mg via ORAL
  Filled 2023-10-19: qty 1

## 2023-10-19 MED ORDER — OXYCODONE HCL 5 MG PO TABS: 5.0000 mg | ORAL_TABLET | ORAL | 0 refills | Status: DC | PRN

## 2023-10-19 MED ORDER — ACETAMINOPHEN 325 MG PO TABS
650.0000 mg | ORAL_TABLET | Freq: Once | ORAL | Status: AC
  Administered 2023-10-19: 650 mg via ORAL
  Filled 2023-10-19: qty 2

## 2023-10-19 MED ORDER — SODIUM CHLORIDE 0.9% FLUSH
10.0000 mL | INTRAVENOUS | Status: DC | PRN
  Administered 2023-10-19: 10 mL via INTRAVENOUS

## 2023-10-19 MED ORDER — SODIUM CHLORIDE 0.9% FLUSH
10.0000 mL | INTRAVENOUS | Status: AC | PRN
Start: 2023-10-19 — End: 2023-10-19
  Administered 2023-10-19: 10 mL

## 2023-10-19 MED ORDER — METHOCARBAMOL 500 MG PO TABS: 500.0000 mg | ORAL_TABLET | Freq: Three times a day (TID) | ORAL | 0 refills | Status: DC | PRN

## 2023-10-19 MED ORDER — SODIUM CHLORIDE 0.9% IV SOLUTION: 250.0000 mL | INTRAVENOUS | Status: DC

## 2023-10-19 MED ORDER — HEPARIN SOD (PORK) LOCK FLUSH 100 UNIT/ML IV SOLN
500.0000 [IU] | Freq: Every day | INTRAVENOUS | Status: AC | PRN
  Administered 2023-10-19: 500 [IU]

## 2023-10-19 MED ORDER — OXYCODONE HCL 5 MG PO TABS
10.0000 mg | ORAL_TABLET | Freq: Once | ORAL | Status: AC
  Administered 2023-10-19: 10 mg via ORAL
  Filled 2023-10-19: qty 2

## 2023-10-19 NOTE — Progress Notes (Signed)
 Palliative Medicine [facility]  [phone] [phone]) [phone]   Name: Jonathan Page Date: 10/19/2023 MRN: [id number]  DOB: [birth year]  Patient Care Team: [name], NP as PCP - General (Internal Medicine) Rosalita Combe, RN as Oncology Nurse Navigator [facility], [facility] (Optometry) Pickenpack-Cousar, Giles Labrum, NP as Nurse Practitioner [facility] and Palliative Medicine)    INTERVAL HISTORY: Jonathan Page is a 73 y.o. male with oncologic medical history including non-small cell lung cancer (06/2023) with metastatic disease to spine and widespread bone. As well as a history of diabetes, hypertension, and seasonal allergies and a 45lb weight loss in the last 3-4 months. Palliative ask to see for symptom management and goals of care.   SOCIAL HISTORY:     reports that he quit smoking about 5 years ago. His smoking use included cigarettes and cigars. He started smoking about 35 years ago. He has a 15 pack-year smoking history. He has never used smokeless tobacco. He reports current alcohol use of about 6.0 standard drinks of alcohol per week. He reports that he does not use drugs.  ADVANCE DIRECTIVES:  None on file   CODE STATUS: Full code  PAST MEDICAL HISTORY: Past Medical History:  Diagnosis Date   Allergy    Cancer (HCC)    Cellulitis 02/21/2018   Concussion syndrome    Diabetes (HCC)    Frequent headaches    Hypertension     ALLERGIES:  has no known allergies.  MEDICATIONS:  Current Outpatient Medications  Medication Sig Dispense Refill   Blood Glucose Monitoring Suppl (ACCU-CHEK AVIVA PLUS) w/Device KIT Check blood sugar before breakfast, lunch and bedtime and as directed. Dx E11.65 1 kit 0   Blood Glucose Monitoring Suppl DEVI 1 each by Does not apply route as directed. May substitute to any manufacturer covered by patient's insurance. 1 each 0   dexamethasone  (DECADRON ) 4 MG tablet Take 2 tabs by mouth 2 times daily  starting day before chemo. Then take 2 tabs daily for 2 days starting day after chemo. Take with food. (Patient not taking: Reported on 10/02/2023) 30 tablet 1   diclofenac  Sodium (VOLTAREN ) 1 % GEL Apply 2 g topically 4 (four) times daily. 100 g 0   docusate sodium  (COLACE) 100 MG capsule Take 1 capsule (100 mg total) by mouth 2 (two) times daily. (Patient not taking: Reported on 10/02/2023) 10 capsule 0   feeding supplement (ENSURE ENLIVE / ENSURE PLUS) LIQD Take 237 mLs by mouth 2 (two) times daily between meals.     folic acid  (FOLVITE ) 1 MG tablet Take 1 mg by mouth in the morning. (Patient not taking: Reported on 10/02/2023)     gabapentin  (NEURONTIN ) 300 MG capsule Take 1 capsule (300 mg total) by mouth at bedtime. (Patient taking differently: Take 300 mg by mouth in the morning.) 30 capsule 1   glipiZIDE  (GLUCOTROL  XL) 10 MG 24 hr tablet Take 1 tablet (10 mg total) by mouth daily with breakfast. for diabetes. 90 tablet 3   glucose blood (ACCU-CHEK AVIVA PLUS) test strip Check blood sugar before breakfast, lunch and bedtime and as directed. Dx E11.65 300 each 1   insulin  NPH-regular Human (70-30) 100 UNIT/ML injection Inject 5 Units into the skin daily with breakfast.     Lancets (ACCU-CHEK SOFT TOUCH) lancets Check blood sugar before breakfast, lunch and bedtime and as directed. Dx E11.65 300 each 1   lidocaine -prilocaine  (EMLA ) cream Apply 1 Application topically as needed. 30  g 1   lisinopril  (ZESTRIL ) 10 MG tablet Take 1 tablet (10 mg total) by mouth daily. for blood pressure. 90 tablet 0   methocarbamol  (ROBAXIN ) 500 MG tablet Take 1 tablet (500 mg total) by mouth every 8 (eight) hours as needed for muscle spasms. 30 tablet 0   ondansetron  (ZOFRAN ) 8 MG tablet Take 8 mg by mouth every 8 (eight) hours as needed for nausea or vomiting.     oxyCODONE  (OXY IR/ROXICODONE ) 5 MG immediate release tablet Take 1-2 tablets (5-10 mg total) by mouth every 4 (four) hours as needed for moderate pain (pain  score 4-6) or severe pain (pain score 7-10). 60 tablet 0   prochlorperazine  (COMPAZINE ) 10 MG tablet Take 1 tablet (10 mg total) by mouth every 6 (six) hours as needed for nausea or vomiting. 30 tablet 0   No current facility-administered medications for this visit.   Facility-Administered Medications Ordered in Other Visits  Medication Dose Route Frequency Provider Last Rate Last Admin   0.9 %  sodium chloride  infusion (Manually program via [employer] IV Fluids)  250 mL Intravenous Continuous Heilingoetter, Cassandra L, PA-C       heparin  lock flush 100 unit/mL  500 Units Intracatheter Daily PRN Heilingoetter, Cassandra L, PA-C       sodium chloride  flush (NS) 0.9 % injection 10 mL  10 mL Intracatheter PRN Heilingoetter, Cassandra L, PA-C        VITAL SIGNS: There were no vitals taken for this visit. There were no vitals filed for this visit.   Estimated body mass index is 18.47 kg/m as calculated from the following:   Height as of an earlier encounter on 10/19/23: 6\' 1"  (1.854 m).   Weight as of 10/02/23: 140 lb (63.5 kg).   PERFORMANCE STATUS (ECOG) : 1 - Symptomatic but completely ambulatory   Physical Exam General: NAD, thin  Cardiovascular: regular rate and rhythm Pulmonary: normal breathing pattern Extremities: no edema, no joint deformities Skin: no rashes Neurological: AAO x3  IMPRESSION: Discussed the use of AI scribe software for clinical note transcription with the patient, who gave verbal consent to proceed.  History of Present Illness Jonathan Page is a 73 year old male who presents to the clinic for follow-up. No family present. Patient in wheelchair. Is complaining of pain. Patient recently discharged from hospital after being treated for [facility] s/p mechanical fall. States he has been feeling somewhat better since discharge. He is currently still at [facility] rehab uncertain about the remaining duration of his stay. . Reports working daily with PT/OT. Is ready  to return home. Previously, he was able to walk without assistance at his girlfriend's place, but now requires a walker.  Denies concerns for nausea, vomiting, or diarrhea. Reports constipation despite use of daily stool softeners (Colace). Will notify SNF to administer Miralax  daily and Senna-S 2 tabs at bedtime as needed for constipation. He verbalized understanding.   We discussed his current medication regimen which includes gabapentin  300 mg at bedtime, Robaxin  every eight hours as needed for muscle spasms, and oxycodone  5 mg every eight hours for pain.   Mr. Brosh states he is experiencing significant pain in his back, chest wall, and left hip which has greatly impacted his quality of life. He rates his pain during visit today at an 8 out of 10. States pain is constant. Some days are better than others. Rates his pain is at a 10 out of 10 at least once daily. Discussed increasing his oxycodone   to 5-10mg  every 6 hours as needed for moderate to severe pain. Will administer a dose of pain medication during his infusion today.   All questions answered and support provided.   I discussed the importance of continued conversation with family and their medical providers regarding overall plan of care and treatment options, ensuring decisions are within the context of the patients values and GOCs. Assessment & Plan Weakness Increased weakness since fall and recent hospitalization due to [facility] s/p fall. - Continue PT/OT sessions at [facility].   Pain Persistent pain managed with gabapentin , Robaxin , and oxycodone . Discussed increasing oxycodone  for improved control. Steroid injections considered for temporary relief. - Increase oxycodone  to 5-10 mg every 4-6 hours. -Continue Robaxin  500mg  every 8 hours as needed.  -Continue Gabapentin  300mg  at bedtime.  - Provide written instructions and orders for medication adjustments to staff at [facility],  Constipation Constipation likely due to  opioid use. - Recommend prune juice with a teaspoon of butter. -Miralax  daily -Senna-S 2 tablets at bedtime as needed.   I will plan to follow-up with patient in 3-4 weeks. Sooner if needed.   Patient expressed understanding and was in agreement with this plan. He also understands that He can call the clinic at any time with any questions, concerns, or complaints.   Any controlled substances utilized were prescribed in the context of palliative care. PDMP has been reviewed.   Visit consisted of counseling and education dealing with the complex and emotionally intense issues of symptom management and palliative care in the setting of serious and potentially life-threatening illness.  Dellia Ferguson, AGPCNP-BC  Palliative Medicine Team/[facility]  Used to

## 2023-10-19 NOTE — Progress Notes (Signed)
 This RN educated Pt to keep blue blood bracelet on for blood transfusion appt on 10/20/23. Pt nodded understanding and confirmed that he would leave it on for tomorrow's appt. Pt was transported to front lobby via w/c by NT. This RN called SNF transportation and Vallorie Gayer from transportation to pick Pt up for return trip to SNF. [place] RN called SNF and established transportation for 10/20/23 appts.

## 2023-10-20 ENCOUNTER — Encounter: Payer: Self-pay | Admitting: Physician Assistant

## 2023-10-20 ENCOUNTER — Inpatient Hospital Stay

## 2023-10-20 ENCOUNTER — Encounter: Payer: Self-pay | Admitting: Internal Medicine

## 2023-10-20 ENCOUNTER — Other Ambulatory Visit: Payer: Self-pay

## 2023-10-20 ENCOUNTER — Other Ambulatory Visit: Payer: Self-pay | Admitting: Physician Assistant

## 2023-10-20 VITALS — BP 113/80 | HR 90 | Temp 97.8°F | Resp 16

## 2023-10-20 DIAGNOSIS — Z5112 Encounter for antineoplastic immunotherapy: Secondary | ICD-10-CM | POA: Diagnosis not present

## 2023-10-20 DIAGNOSIS — K5900 Constipation, unspecified: Secondary | ICD-10-CM | POA: Diagnosis not present

## 2023-10-20 DIAGNOSIS — Z5111 Encounter for antineoplastic chemotherapy: Secondary | ICD-10-CM | POA: Diagnosis not present

## 2023-10-20 DIAGNOSIS — C7951 Secondary malignant neoplasm of bone: Secondary | ICD-10-CM | POA: Diagnosis not present

## 2023-10-20 DIAGNOSIS — Z5189 Encounter for other specified aftercare: Secondary | ICD-10-CM | POA: Diagnosis not present

## 2023-10-20 DIAGNOSIS — M25552 Pain in left hip: Secondary | ICD-10-CM | POA: Diagnosis not present

## 2023-10-20 DIAGNOSIS — E119 Type 2 diabetes mellitus without complications: Secondary | ICD-10-CM | POA: Diagnosis not present

## 2023-10-20 DIAGNOSIS — C3412 Malignant neoplasm of upper lobe, left bronchus or lung: Secondary | ICD-10-CM | POA: Diagnosis not present

## 2023-10-20 DIAGNOSIS — D649 Anemia, unspecified: Secondary | ICD-10-CM

## 2023-10-20 DIAGNOSIS — Z66 Do not resuscitate: Secondary | ICD-10-CM | POA: Diagnosis not present

## 2023-10-20 DIAGNOSIS — D509 Iron deficiency anemia, unspecified: Secondary | ICD-10-CM

## 2023-10-20 DIAGNOSIS — Z95828 Presence of other vascular implants and grafts: Secondary | ICD-10-CM

## 2023-10-20 LAB — PREPARE RBC (CROSSMATCH)

## 2023-10-20 MED ORDER — DIPHENHYDRAMINE HCL 25 MG PO CAPS
25.0000 mg | ORAL_CAPSULE | Freq: Once | ORAL | Status: AC
  Administered 2023-10-20: 25 mg via ORAL
  Filled 2023-10-20: qty 1

## 2023-10-20 MED ORDER — SODIUM CHLORIDE 0.9% IV SOLUTION
250.0000 mL | Freq: Once | INTRAVENOUS | Status: AC
  Administered 2023-10-20: 100 mL via INTRAVENOUS

## 2023-10-20 MED ORDER — SODIUM CHLORIDE 0.9% FLUSH
10.0000 mL | INTRAVENOUS | Status: DC | PRN
  Administered 2023-10-20: 10 mL via INTRAVENOUS

## 2023-10-20 MED ORDER — ACETAMINOPHEN 325 MG PO TABS
650.0000 mg | ORAL_TABLET | Freq: Once | ORAL | Status: AC
  Administered 2023-10-20: 650 mg via ORAL
  Filled 2023-10-20: qty 2

## 2023-10-20 NOTE — Patient Instructions (Signed)

## 2023-10-21 ENCOUNTER — Inpatient Hospital Stay

## 2023-10-21 DIAGNOSIS — R638 Other symptoms and signs concerning food and fluid intake: Secondary | ICD-10-CM | POA: Diagnosis not present

## 2023-10-21 DIAGNOSIS — C3490 Malignant neoplasm of unspecified part of unspecified bronchus or lung: Secondary | ICD-10-CM | POA: Diagnosis not present

## 2023-10-21 DIAGNOSIS — C799 Secondary malignant neoplasm of unspecified site: Secondary | ICD-10-CM | POA: Diagnosis not present

## 2023-10-21 DIAGNOSIS — D649 Anemia, unspecified: Secondary | ICD-10-CM | POA: Diagnosis not present

## 2023-10-21 DIAGNOSIS — R531 Weakness: Secondary | ICD-10-CM | POA: Diagnosis not present

## 2023-10-21 DIAGNOSIS — Z7409 Other reduced mobility: Secondary | ICD-10-CM | POA: Diagnosis not present

## 2023-10-21 DIAGNOSIS — E43 Unspecified severe protein-calorie malnutrition: Secondary | ICD-10-CM | POA: Diagnosis not present

## 2023-10-21 DIAGNOSIS — G893 Neoplasm related pain (acute) (chronic): Secondary | ICD-10-CM | POA: Diagnosis not present

## 2023-10-21 DIAGNOSIS — R5381 Other malaise: Secondary | ICD-10-CM | POA: Diagnosis not present

## 2023-10-23 ENCOUNTER — Encounter: Payer: Self-pay | Admitting: Physician Assistant

## 2023-10-23 ENCOUNTER — Encounter: Payer: Self-pay | Admitting: Internal Medicine

## 2023-10-23 LAB — BPAM RBC
Blood Product Expiration Date: 202506072359
Blood Product Expiration Date: 202506072359
ISSUE DATE / TIME: 202505091135
ISSUE DATE / TIME: 202505091337
Unit Type and Rh: 5100
Unit Type and Rh: 5100

## 2023-10-23 LAB — TYPE AND SCREEN
ABO/RH(D): O POS
Antibody Screen: POSITIVE
Donor AG Type: NEGATIVE
Donor AG Type: NEGATIVE
Unit division: 0
Unit division: 0

## 2023-10-24 DIAGNOSIS — R531 Weakness: Secondary | ICD-10-CM | POA: Diagnosis not present

## 2023-10-24 DIAGNOSIS — C3490 Malignant neoplasm of unspecified part of unspecified bronchus or lung: Secondary | ICD-10-CM | POA: Diagnosis not present

## 2023-10-24 DIAGNOSIS — C799 Secondary malignant neoplasm of unspecified site: Secondary | ICD-10-CM | POA: Diagnosis not present

## 2023-10-24 DIAGNOSIS — Z7409 Other reduced mobility: Secondary | ICD-10-CM | POA: Diagnosis not present

## 2023-10-24 DIAGNOSIS — G893 Neoplasm related pain (acute) (chronic): Secondary | ICD-10-CM | POA: Diagnosis not present

## 2023-10-24 DIAGNOSIS — E43 Unspecified severe protein-calorie malnutrition: Secondary | ICD-10-CM | POA: Diagnosis not present

## 2023-10-25 ENCOUNTER — Ambulatory Visit: Payer: Self-pay

## 2023-10-25 ENCOUNTER — Inpatient Hospital Stay (HOSPITAL_BASED_OUTPATIENT_CLINIC_OR_DEPARTMENT_OTHER): Admitting: Internal Medicine

## 2023-10-25 ENCOUNTER — Inpatient Hospital Stay

## 2023-10-25 ENCOUNTER — Encounter: Payer: Self-pay | Admitting: Internal Medicine

## 2023-10-25 VITALS — BP 90/66 | HR 105 | Temp 97.3°F | Resp 16 | Ht 73.0 in

## 2023-10-25 VITALS — BP 118/78 | HR 102 | Resp 16

## 2023-10-25 DIAGNOSIS — K5900 Constipation, unspecified: Secondary | ICD-10-CM | POA: Diagnosis not present

## 2023-10-25 DIAGNOSIS — C3412 Malignant neoplasm of upper lobe, left bronchus or lung: Secondary | ICD-10-CM | POA: Diagnosis not present

## 2023-10-25 DIAGNOSIS — C3490 Malignant neoplasm of unspecified part of unspecified bronchus or lung: Secondary | ICD-10-CM

## 2023-10-25 DIAGNOSIS — M25552 Pain in left hip: Secondary | ICD-10-CM | POA: Diagnosis not present

## 2023-10-25 DIAGNOSIS — C7951 Secondary malignant neoplasm of bone: Secondary | ICD-10-CM | POA: Diagnosis not present

## 2023-10-25 DIAGNOSIS — Z5112 Encounter for antineoplastic immunotherapy: Secondary | ICD-10-CM | POA: Diagnosis not present

## 2023-10-25 DIAGNOSIS — Z95828 Presence of other vascular implants and grafts: Secondary | ICD-10-CM

## 2023-10-25 DIAGNOSIS — D509 Iron deficiency anemia, unspecified: Secondary | ICD-10-CM

## 2023-10-25 DIAGNOSIS — Z66 Do not resuscitate: Secondary | ICD-10-CM | POA: Diagnosis not present

## 2023-10-25 DIAGNOSIS — E119 Type 2 diabetes mellitus without complications: Secondary | ICD-10-CM | POA: Diagnosis not present

## 2023-10-25 DIAGNOSIS — Z5189 Encounter for other specified aftercare: Secondary | ICD-10-CM | POA: Diagnosis not present

## 2023-10-25 DIAGNOSIS — Z5111 Encounter for antineoplastic chemotherapy: Secondary | ICD-10-CM | POA: Diagnosis not present

## 2023-10-25 LAB — CMP (CANCER CENTER ONLY)
ALT: 6 U/L (ref 0–44)
AST: 12 U/L — ABNORMAL LOW (ref 15–41)
Albumin: 3.5 g/dL (ref 3.5–5.0)
Alkaline Phosphatase: 155 U/L — ABNORMAL HIGH (ref 38–126)
Anion gap: 8 (ref 5–15)
BUN: 36 mg/dL — ABNORMAL HIGH (ref 8–23)
CO2: 24 mmol/L (ref 22–32)
Calcium: 9 mg/dL (ref 8.9–10.3)
Chloride: 108 mmol/L (ref 98–111)
Creatinine: 1.36 mg/dL — ABNORMAL HIGH (ref 0.61–1.24)
GFR, Estimated: 55 mL/min — ABNORMAL LOW (ref >60)
Glucose, Bld: 116 mg/dL — ABNORMAL HIGH (ref 70–99)
Potassium: 4.3 mmol/L (ref 3.5–5.1)
Sodium: 140 mmol/L (ref 135–145)
Total Bilirubin: 0.7 mg/dL (ref 0.0–1.2)
Total Protein: 6.2 g/dL — ABNORMAL LOW (ref 6.5–8.1)

## 2023-10-25 LAB — IRON AND IRON BINDING CAPACITY (CC-WL,HP ONLY)
Iron: 54 ug/dL (ref 45–182)
Saturation Ratios: 29 % (ref 17.9–39.5)
TIBC: 183 ug/dL — ABNORMAL LOW (ref 250–450)
UIBC: 129 ug/dL (ref 117–376)

## 2023-10-25 LAB — CBC WITH DIFFERENTIAL (CANCER CENTER ONLY)
Abs Immature Granulocytes: 0.45 10*3/uL — ABNORMAL HIGH (ref 0.00–0.07)
Basophils Absolute: 0.1 10*3/uL (ref 0.0–0.1)
Basophils Relative: 1 %
Eosinophils Absolute: 0 10*3/uL (ref 0.0–0.5)
Eosinophils Relative: 0 %
HCT: 29.2 % — ABNORMAL LOW (ref 39.0–52.0)
Hemoglobin: 9.3 g/dL — ABNORMAL LOW (ref 13.0–17.0)
Immature Granulocytes: 3 %
Lymphocytes Relative: 6 %
Lymphs Abs: 1.1 10*3/uL (ref 0.7–4.0)
MCH: 33.8 pg (ref 26.0–34.0)
MCHC: 31.8 g/dL (ref 30.0–36.0)
MCV: 106.2 fL — ABNORMAL HIGH (ref 80.0–100.0)
Monocytes Absolute: 1 10*3/uL (ref 0.1–1.0)
Monocytes Relative: 6 %
Neutro Abs: 15.5 10*3/uL — ABNORMAL HIGH (ref 1.7–7.7)
Neutrophils Relative %: 84 %
Platelet Count: 108 10*3/uL — ABNORMAL LOW (ref 150–400)
RBC: 2.75 MIL/uL — ABNORMAL LOW (ref 4.22–5.81)
RDW: 23.9 % — ABNORMAL HIGH (ref 11.5–15.5)
WBC Count: 18.2 10*3/uL — ABNORMAL HIGH (ref 4.0–10.5)
nRBC: 0.2 % (ref 0.0–0.2)

## 2023-10-25 LAB — FERRITIN: Ferritin: 3370 ng/mL — ABNORMAL HIGH (ref 24–336)

## 2023-10-25 LAB — FOLATE: Folate: 20 ng/mL (ref >5.9)

## 2023-10-25 LAB — VITAMIN B12: Vitamin B-12: 5982 pg/mL — ABNORMAL HIGH (ref 180–914)

## 2023-10-25 MED ORDER — SODIUM CHLORIDE 0.9% FLUSH
10.0000 mL | INTRAVENOUS | Status: DC | PRN
  Administered 2023-10-25: 10 mL

## 2023-10-25 MED ORDER — SODIUM CHLORIDE 0.9 % IV SOLN
INTRAVENOUS | Status: DC
Start: 2023-10-25 — End: 2023-10-25

## 2023-10-25 MED ORDER — SODIUM CHLORIDE 0.9% FLUSH
10.0000 mL | INTRAVENOUS | Status: DC | PRN
  Administered 2023-10-25: 10 mL via INTRAVENOUS

## 2023-10-25 MED ORDER — DEXAMETHASONE SODIUM PHOSPHATE 10 MG/ML IJ SOLN
10.0000 mg | Freq: Once | INTRAMUSCULAR | Status: AC
  Administered 2023-10-25: 10 mg via INTRAVENOUS
  Filled 2023-10-25: qty 1

## 2023-10-25 MED ORDER — DIPHENHYDRAMINE HCL 50 MG/ML IJ SOLN
50.0000 mg | Freq: Once | INTRAMUSCULAR | Status: AC
  Administered 2023-10-25: 50 mg via INTRAVENOUS
  Filled 2023-10-25: qty 1

## 2023-10-25 MED ORDER — ACETAMINOPHEN 325 MG PO TABS
650.0000 mg | ORAL_TABLET | Freq: Once | ORAL | Status: AC
  Administered 2023-10-25: 650 mg via ORAL
  Filled 2023-10-25: qty 2

## 2023-10-25 MED ORDER — HEPARIN SOD (PORK) LOCK FLUSH 100 UNIT/ML IV SOLN
500.0000 [IU] | Freq: Once | INTRAVENOUS | Status: AC
  Administered 2023-10-25: 500 [IU] via INTRAVENOUS

## 2023-10-25 MED ORDER — HEPARIN SOD (PORK) LOCK FLUSH 100 UNIT/ML IV SOLN
500.0000 [IU] | Freq: Once | INTRAVENOUS | Status: AC | PRN
  Administered 2023-10-25: 500 [IU]

## 2023-10-25 MED ORDER — DOCETAXEL CHEMO INJECTION 160 MG/16ML
65.0000 mg/m2 | Freq: Once | INTRAVENOUS | Status: AC
  Administered 2023-10-25: 119 mg via INTRAVENOUS
  Filled 2023-10-25: qty 11.9

## 2023-10-25 NOTE — Patient Instructions (Signed)
 [facility] - A DEPT OF [facility]  Discharge Instructions: Thank you for choosing [facility] to provide your oncology and hematology care.   If you have a lab appointment with the Cancer Center, please go directly to the Cancer Center and check in at the registration area.   Wear comfortable clothing and clothing appropriate for easy access to any Portacath or PICC line.   We strive to give you quality time with your provider. You may need to reschedule your appointment if you arrive late (15 or more minutes).  Arriving late affects you and other patients whose appointments are after yours.  Also, if you miss three or more appointments without notifying the office, you may be dismissed from the clinic at the provider's discretion.      For prescription refill requests, have your pharmacy contact our office and allow 72 hours for refills to be completed.    Today you received the following chemotherapy and/or immunotherapy agents Taxotere.      To help prevent nausea and vomiting after your treatment, we encourage you to take your nausea medication as directed.  BELOW ARE SYMPTOMS THAT SHOULD BE REPORTED IMMEDIATELY: *FEVER GREATER THAN 100.4 F (38 C) OR HIGHER *CHILLS OR SWEATING *NAUSEA AND VOMITING THAT IS NOT CONTROLLED WITH YOUR NAUSEA MEDICATION *UNUSUAL SHORTNESS OF BREATH *UNUSUAL BRUISING OR BLEEDING *URINARY PROBLEMS (pain or burning when urinating, or frequent urination) *BOWEL PROBLEMS (unusual diarrhea, constipation, pain near the anus) TENDERNESS IN MOUTH AND THROAT WITH OR WITHOUT PRESENCE OF ULCERS (sore throat, sores in mouth, or a toothache) UNUSUAL RASH, SWELLING OR PAIN  UNUSUAL VAGINAL DISCHARGE OR ITCHING   Items with * indicate a potential emergency and should be followed up as soon as possible or go to the Emergency Department if any problems should occur.  Please show the CHEMOTHERAPY ALERT CARD or IMMUNOTHERAPY  ALERT CARD at check-in to the Emergency Department and triage nurse.  Should you have questions after your visit or need to cancel or reschedule your appointment, please contact [facility] - A DEPT OF [facility]  Dept: [phone]  and follow the prompts.  Office hours are 8:00 a.m. to 4:30 p.m. Monday - Friday. Please note that voicemails left after 4:00 p.m. may not be returned until the following business day.  We are closed weekends and major holidays. You have access to a nurse at all times for urgent questions. Please call the main number to the clinic Dept: [phone] and follow the prompts.   For any non-urgent questions, you may also contact your provider using MyChart. We now offer e-Visits for anyone 69 and older to request care online for non-urgent symptoms. For details visit [email or web address].   Also download the MyChart app! Go to the app store, search "MyChart", open the app, select [facility], and log in with your MyChart username and password.

## 2023-10-25 NOTE — Progress Notes (Signed)
 [facility] [phone]   Fax:([phone]) [phone]  OFFICE PROGRESS NOTE  [name], NP [address] [name] [address]  DIAGNOSIS: Stage IV (T3, N2, M1 C) non-small cell lung cancer, adenocarcinoma presented with large left upper lobe lung mass with left hilar and mediastinal invasion in addition to thoracic inlet lymphadenopathy as well as several metastatic bone lesions involving the spine, ribs as well as the right iliac bone diagnosed in December 2024.   Molecular studies by [id number] showed no actionable mutations and PD-L1 expression was negative.   PRIOR THERAPY: Palliative systemic chemotherapy and immunotherapy with carboplatin  for an AUC of 5, Paclitaxol 175 mg/m, and Libtayo  IV every 3 weeks with Neulasta  support.  (Not a candidate for Alimta due to CKD).  First dose on 07/03/23.  Status post 3 cycles.  Last dose was given on August 28, 2023.  This treatment was discontinued secondary to disease progression.   CURRENT THERAPY: 1) second line systemic chemotherapy with docetaxel  75 Mg/M2 and ramucirumab  10 Mg/KG every 3 weeks with Neulasta  support.  First dose September 25, 2023.  Status post 1 cycle.  Ramucirumab  was discontinued after cycle #1 secondary to recent fall and subarachnoid hemorrhage. 2) radiation to L4 and L5 as well as the left hip under the care of Dr. Lorri Rota.   INTERVAL HISTORY: Jonathan Page 73 y.o. male returns to the clinic today for follow-up visit.Discussed the use of AI scribe software for clinical note transcription with the patient, who gave verbal consent to proceed.  History of Present Illness   Jonathan Page is a 73 year old male with adenocarcinoma who presents for evaluation before resuming treatment.  He has a history of adenocarcinoma diagnosed in December 2024. Initial treatment included carboplatin , paclitaxel , and pembrolizumab. Due to disease progression, he transitioned to second-line treatment with  docetaxel  and ramucirumab . He is eager to resume treatment after a recent interruption.  Recently, he experienced a fall resulting in a syncopal episode and was diagnosed with a subarachnoid hemorrhage. This event led to a temporary halt in his cancer treatment. He is currently residing in a skilled nursing facility and feels significantly better, attributing his improved energy levels to a recent blood transfusion.  He is not receiving physical therapy at the nursing facility. Additionally, he has not been administered steroids (Decadron ) at the facility, which are typically given the day before, the day of, and the day after chemotherapy.        MEDICAL HISTORY: Past Medical History:  Diagnosis Date   Allergy    Cancer (HCC)    Cellulitis 02/21/2018   Concussion syndrome    Diabetes (HCC)    Frequent headaches    Hypertension     ALLERGIES:  has no known allergies.  MEDICATIONS:  Current Outpatient Medications  Medication Sig Dispense Refill   oxyCODONE  (OXY IR/ROXICODONE ) 5 MG immediate release tablet Take 1-2 tablets (5-10 mg total) by mouth every 4 (four) hours as needed for moderate pain (pain score 4-6) or severe pain (pain score 7-10). 60 tablet 0   Blood Glucose Monitoring Suppl (ACCU-CHEK AVIVA PLUS) w/Device KIT Check blood sugar before breakfast, lunch and bedtime and as directed. Dx E11.65 1 kit 0   Blood Glucose Monitoring Suppl DEVI 1 each by Does not apply route as directed. May substitute to any manufacturer covered by patient's insurance. 1 each 0   dexamethasone  (DECADRON ) 4 MG tablet Take 2 tabs by mouth 2 times  daily starting day before chemo. Then take 2 tabs daily for 2 days starting day after chemo. Take with food. (Patient not taking: Reported on 10/02/2023) 30 tablet 1   diclofenac  Sodium (VOLTAREN ) 1 % GEL Apply 2 g topically 4 (four) times daily. 100 g 0   docusate sodium  (COLACE) 100 MG capsule Take 1 capsule (100 mg total) by mouth 2 (two) times daily.  (Patient not taking: Reported on 10/02/2023) 10 capsule 0   feeding supplement (ENSURE ENLIVE / ENSURE PLUS) LIQD Take 237 mLs by mouth 2 (two) times daily between meals.     folic acid  (FOLVITE ) 1 MG tablet Take 1 mg by mouth in the morning. (Patient not taking: Reported on 10/02/2023)     gabapentin  (NEURONTIN ) 300 MG capsule Take 1 capsule (300 mg total) by mouth at bedtime. (Patient taking differently: Take 300 mg by mouth in the morning.) 30 capsule 1   glipiZIDE  (GLUCOTROL  XL) 10 MG 24 hr tablet Take 1 tablet (10 mg total) by mouth daily with breakfast. for diabetes. 90 tablet 3   glucose blood (ACCU-CHEK AVIVA PLUS) test strip Check blood sugar before breakfast, lunch and bedtime and as directed. Dx E11.65 300 each 1   insulin  NPH-regular Human (70-30) 100 UNIT/ML injection Inject 5 Units into the skin daily with breakfast.     Lancets (ACCU-CHEK SOFT TOUCH) lancets Check blood sugar before breakfast, lunch and bedtime and as directed. Dx E11.65 300 each 1   lidocaine -prilocaine  (EMLA ) cream Apply 1 Application topically as needed. 30 g 1   lisinopril  (ZESTRIL ) 10 MG tablet Take 1 tablet (10 mg total) by mouth daily. for blood pressure. 90 tablet 0   methocarbamol  (ROBAXIN ) 500 MG tablet Take 1 tablet (500 mg total) by mouth every 8 (eight) hours as needed for muscle spasms. 30 tablet 0   ondansetron  (ZOFRAN ) 8 MG tablet Take 8 mg by mouth every 8 (eight) hours as needed for nausea or vomiting.     prochlorperazine  (COMPAZINE ) 10 MG tablet Take 1 tablet (10 mg total) by mouth every 6 (six) hours as needed for nausea or vomiting. 30 tablet 0   No current facility-administered medications for this visit.    SURGICAL HISTORY:  Past Surgical History:  Procedure Laterality Date   APPENDECTOMY  1960   BRONCHIAL BIOPSY  06/12/2023   Procedure: BRONCHIAL BIOPSIES;  Surgeon: Prudy Brownie, DO;  Location: [facility];  Service: Cardiopulmonary;;   BRONCHIAL BRUSHINGS  06/12/2023   Procedure:  BRONCHIAL BRUSHINGS;  Surgeon: Prudy Brownie, DO;  Location: [facility];  Service: Cardiopulmonary;;   COLONOSCOPY     FEMUR IM NAIL Left 07/25/2023   Procedure: LEFT HIP INTRAMEDULLARY HIP SCREW;  Surgeon: Jasmine Mesi, MD;  Location: [facility] OR;  Service: Orthopedics;  Laterality: Left;   HIP SURGERY Left 07/25/2023   metal pin placed in L hip area   IR IMAGING GUIDED PORT INSERTION  07/13/2023   VIDEO BRONCHOSCOPY Bilateral 06/12/2023   Procedure: VIDEO BRONCHOSCOPY;  Surgeon: Prudy Brownie, DO;  Location: [facility];  Service: Cardiopulmonary;  Laterality: Bilateral;   WISDOM TOOTH EXTRACTION      REVIEW OF SYSTEMS:  Constitutional: positive for fatigue Eyes: negative Ears, nose, mouth, throat, and face: negative Respiratory: negative Cardiovascular: negative Gastrointestinal: negative Genitourinary:negative Integument/breast: negative Hematologic/lymphatic: negative Musculoskeletal:positive for bone pain Neurological: negative Behavioral/Psych: negative Endocrine: negative Allergic/Immunologic: negative   PHYSICAL EXAMINATION: General appearance: alert, cooperative, fatigued, and no distress Head: Normocephalic, without obvious abnormality, atraumatic Neck: no adenopathy, no  JVD, supple, symmetrical, trachea midline, and thyroid  not enlarged, symmetric, no tenderness/mass/nodules Lymph nodes: Cervical, supraclavicular, and axillary nodes normal. Resp: normal percussion bilaterally Back: symmetric, no curvature. ROM normal. No CVA tenderness. Cardio: regular rate and rhythm, S1, S2 normal, no murmur, click, rub or gallop GI: soft, non-tender; bowel sounds normal; no masses,  no organomegaly Extremities: extremities normal, atraumatic, no cyanosis or edema Neurologic: Alert and oriented X 3, normal strength and tone. Normal symmetric reflexes. Normal coordination and gait  ECOG PERFORMANCE STATUS: 1 - Symptomatic but completely ambulatory  Blood pressure 90/66,  pulse (!) 105, temperature (!) 97.3 F (36.3 C), temperature source Temporal, resp. rate 16, height 6\' 1"  (1.854 m), SpO2 92%.  LABORATORY DATA: Lab Results  Component Value Date   WBC 18.2 (H) 10/25/2023   HGB 9.3 (L) 10/25/2023   HCT 29.2 (L) 10/25/2023   MCV 106.2 (H) 10/25/2023   PLT 108 (L) 10/25/2023      Chemistry      Component Value Date/Time   NA 140 10/25/2023 0921   K 4.3 10/25/2023 0921   CL 108 10/25/2023 0921   CO2 24 10/25/2023 0921   BUN 36 (H) 10/25/2023 0921   CREATININE 1.36 (H) 10/25/2023 0921      Component Value Date/Time   CALCIUM  9.0 10/25/2023 0921   ALKPHOS 155 (H) 10/25/2023 0921   AST 12 (L) 10/25/2023 0921   ALT 6 10/25/2023 0921   BILITOT 0.7 10/25/2023 0921       RADIOGRAPHIC STUDIES: DG Chest Port 1 View Result Date: 10/05/2023 CLINICAL DATA:  Chest pain EXAM: PORTABLE CHEST 1 VIEW COMPARISON:  October 02, 2023 FINDINGS: No change in the right IJ Infuse-A-Port catheter Persistent improving left lung pneumonia infiltrates. With some infiltrates of the right lung as well without consolidations. Small left effusion Heart normal size IMPRESSION: Improving left lung pneumonia. Electronically Signed   By: Fredrich Jefferson M.D.   On: 10/05/2023 13:31   CT Head Wo Contrast Result Date: 10/03/2023 CLINICAL DATA:  Subarachnoid hemorrhage follow-up EXAM: CT HEAD WITHOUT CONTRAST TECHNIQUE: Contiguous axial images were obtained from the base of the skull through the vertex without intravenous contrast. RADIATION DOSE REDUCTION: This exam was performed according to the departmental dose-optimization program which includes automated exposure control, adjustment of the mA and/or kV according to patient size and/or use of iterative reconstruction technique. COMPARISON:  Yesterday FINDINGS: Brain: Similar pattern and degree of patchy subarachnoid hemorrhage scattered along bilateral sulci, up to 7 mm in thickness at the left vertex. Trace left parafalcine subdural  hemorrhage is noted along the posterior falx, stable. No evidence of infarct, hydrocephalus, or brain edema. There is cerebral volume loss with mild low-density in the cerebral white matter. No evidence of acute infarct. Vascular: No hyperdense vessel or unexpected calcification. Skull: Lytic appearance at the right occipital condyle, known osseous metastatic disease by prior report. Sinuses/Orbits: No acute finding IMPRESSION: Unchanged patchy subarachnoid hemorrhage along the bilateral cerebral convexity when compared to yesterday. Trace left parafalcine subdural hematoma shows no interval thickening. Electronically Signed   By: Ronnette Coke M.D.   On: 10/03/2023 05:39   DG Pelvis Portable Result Date: 10/02/2023 CLINICAL DATA:  Fall with left hip pain. EXAM: PORTABLE PELVIS 1-2 VIEWS COMPARISON:  CT 09/11/2023 FINDINGS: IM rod and screw fixation left femur. No acute fracture or dislocation. Permeative of appearance of the medial cortex of the proximal left femur suspicious for metastases. IMPRESSION: No acute fracture or dislocation. Permeative of appearance of the medial cortex  of the proximal left femur suspicious for metastases. Electronically Signed   By: Rozell Cornet M.D.   On: 10/02/2023 20:44   DG Chest Portable 1 View Result Date: 10/02/2023 CLINICAL DATA:  Lung cancer. EXAM: PORTABLE CHEST 1 VIEW COMPARISON:  None Available. FINDINGS: Unremarkable cardiac size. Extensive alveolar opacification left mid to lower lung consistent with pneumonia. Left-sided moderate pleural effusion. No pneumothorax. There is small area of alveolar opacity in the right mid hemithorax. Right-sided Port-A-Cath tip overlies distal SVC. IMPRESSION: Bilateral pulmonary opacities, left greater than right consistent with pneumonia, edema or mass. Left-sided effusion. Electronically Signed   By: Sydell Eva M.D.   On: 10/02/2023 20:40   CT Head Wo Contrast Addendum Date: 10/02/2023 ADDENDUM REPORT: 10/02/2023  20:36 ADDENDUM: Critical Value/emergent results were called by telephone at the time of interpretation on 10/02/2023 at 8:36 pm to provider [facility] , who verbally acknowledged these results. Electronically Signed   By: Juanetta Nordmann M.D.   On: 10/02/2023 20:36   Result Date: 10/02/2023 CLINICAL DATA:  Fall EXAM: CT HEAD WITHOUT CONTRAST CT CERVICAL SPINE WITHOUT CONTRAST TECHNIQUE: Multidetector CT imaging of the head and cervical spine was performed following the standard protocol without intravenous contrast. Multiplanar CT image reconstructions of the cervical spine were also generated. RADIATION DOSE REDUCTION: This exam was performed according to the departmental dose-optimization program which includes automated exposure control, adjustment of the mA and/or kV according to patient size and/or use of iterative reconstruction technique. COMPARISON:  09/30/2023 FINDINGS: CT HEAD FINDINGS Brain: Multifocal acute subarachnoid hemorrhage over both hemispheres, greatest at the right sylvian fissure. The amount of hemorrhage is worsened since the prior examination. No midline shift or other mass effect. Confluent white matter hypoattenuation. Mild volume loss. Vascular: No hyperdense vessel or unexpected calcification. Skull: No fracture. Sinuses/Orbits: No acute finding. Other: None. CT CERVICAL SPINE FINDINGS Alignment: Normal. Skull base and vertebrae: There is widespread osseous metastatic disease affecting the ribs, cervical spine and lower skull base. Unchanged appearance of pathologic fractures at T1 and C6. No new fracture. Soft tissues and spinal canal: Hyperdense foci within the left subarticular zone at the C1-2 level. Disc levels:  No high-grade spinal canal stenosis. Upper chest: 6 mm right apical pulmonary nodule. Incompletely visualized left pleural effusion. Other: None IMPRESSION: 1. Multifocal acute subarachnoid hemorrhage over both hemispheres, greatest at the right Sylvian fissure. The  amount of hemorrhage is worsened since the prior examination. No midline shift or other mass effect. 2. Widespread osseous metastatic disease affecting the ribs, cervical spine and lower skull base. Unchanged appearance of pathologic fractures at T1 and C6. No new fracture. 3. Unchanged hyperdense foci within the left subarticular zone at the C1-2 level, favored to be a meningioma. 4. Incompletely visualized left pleural effusion. 5. Unchanged 6 mm right apical pulmonary nodule. Electronically Signed: By: Juanetta Nordmann M.D. On: 10/02/2023 20:27   CT Cervical Spine Wo Contrast Addendum Date: 10/02/2023 ADDENDUM REPORT: 10/02/2023 20:36 ADDENDUM: Critical Value/emergent results were called by telephone at the time of interpretation on 10/02/2023 at 8:36 pm to provider [facility] , who verbally acknowledged these results. Electronically Signed   By: Juanetta Nordmann M.D.   On: 10/02/2023 20:36   Result Date: 10/02/2023 CLINICAL DATA:  Fall EXAM: CT HEAD WITHOUT CONTRAST CT CERVICAL SPINE WITHOUT CONTRAST TECHNIQUE: Multidetector CT imaging of the head and cervical spine was performed following the standard protocol without intravenous contrast. Multiplanar CT image reconstructions of the cervical spine were also generated. RADIATION DOSE REDUCTION:  This exam was performed according to the departmental dose-optimization program which includes automated exposure control, adjustment of the mA and/or kV according to patient size and/or use of iterative reconstruction technique. COMPARISON:  09/30/2023 FINDINGS: CT HEAD FINDINGS Brain: Multifocal acute subarachnoid hemorrhage over both hemispheres, greatest at the right sylvian fissure. The amount of hemorrhage is worsened since the prior examination. No midline shift or other mass effect. Confluent white matter hypoattenuation. Mild volume loss. Vascular: No hyperdense vessel or unexpected calcification. Skull: No fracture. Sinuses/Orbits: No acute finding. Other:  None. CT CERVICAL SPINE FINDINGS Alignment: Normal. Skull base and vertebrae: There is widespread osseous metastatic disease affecting the ribs, cervical spine and lower skull base. Unchanged appearance of pathologic fractures at T1 and C6. No new fracture. Soft tissues and spinal canal: Hyperdense foci within the left subarticular zone at the C1-2 level. Disc levels:  No high-grade spinal canal stenosis. Upper chest: 6 mm right apical pulmonary nodule. Incompletely visualized left pleural effusion. Other: None IMPRESSION: 1. Multifocal acute subarachnoid hemorrhage over both hemispheres, greatest at the right Sylvian fissure. The amount of hemorrhage is worsened since the prior examination. No midline shift or other mass effect. 2. Widespread osseous metastatic disease affecting the ribs, cervical spine and lower skull base. Unchanged appearance of pathologic fractures at T1 and C6. No new fracture. 3. Unchanged hyperdense foci within the left subarticular zone at the C1-2 level, favored to be a meningioma. 4. Incompletely visualized left pleural effusion. 5. Unchanged 6 mm right apical pulmonary nodule. Electronically Signed: By: Juanetta Nordmann M.D. On: 10/02/2023 20:27   CT Head Wo Contrast Result Date: 09/30/2023 CLINICAL DATA:  6 hour repeat head CT for subarachnoid hemorrhage identified this morning after a fall. EXAM: CT HEAD WITHOUT CONTRAST TECHNIQUE: Contiguous axial images were obtained from the base of the skull through the vertex without intravenous contrast. RADIATION DOSE REDUCTION: This exam was performed according to the departmental dose-optimization program which includes automated exposure control, adjustment of the mA and/or kV according to patient size and/or use of iterative reconstruction technique. COMPARISON:  CT head 09/30/2023 at 10:17 a.m. FINDINGS: Brain: Subarachnoid hemorrhage overlying the left cerebral hemisphere is redemonstrated. The largest focus of hemorrhage overlies the left  frontal/parietal convexity (series 2/image 29) and measures 2.8 x 1.5 cm, previously 2.1 x 1.3 cm using similar measuring technique. Minimal if any adjacent mass effect. Smaller foci overlying the posterior left parietal lobe have also increased. For example the largest measures 8 mm (series 2/image 25), previously 5 mm. Tiny focus of hemorrhage over the right parietal convexity near the vertex (series 5/image 41 is unchanged. Stable ventricular caliber. No midline shift. No evidence of acute infarct. Vascular: No hyperdense vessel or unexpected calcification. Skull: No calvarial fracture.  Left posterior scalp hematoma. Sinuses/Orbits: No acute abnormality. Other: None. IMPRESSION: 1. Foci of subarachnoid hemorrhage overlying the left cerebral hemisphere have mildly increased in size since earlier today. Minimal if any adjacent mass effect. No midline shift. 2. Tiny focus of hemorrhage over the right parietal convexity near the vertex is unchanged. Electronically Signed   By: Rozell Cornet M.D.   On: 09/30/2023 17:12   DG Hip Unilat W or Wo Pelvis 2-3 Views Left Result Date: 09/30/2023 CLINICAL DATA:  Status post fall. EXAM: DG HIP (WITH OR WITHOUT PELVIS) 2-3V LEFT COMPARISON:  CT from 09/11/23 FINDINGS: Again seen are postsurgical changes from IM nail and cannulated screw placement within the proximal femur and hip. Signs of diffuse, lytic osseous metastases are again noted  involving the bony pelvis and proximal left femur. Heterotopic bone is again seen overlying the greater trochanter of the left proximal femur. There are no signs of acute fracture or dislocation. Spondylosis within the lower lumbar spine. IMPRESSION: 1. No acute findings. 2. Signs of diffuse osseous metastases. 3. Postsurgical changes from IM nail and cannulated screw placement within the proximal left femur and hip. Electronically Signed   By: Kimberley Penman M.D.   On: 09/30/2023 11:23   CT Head Wo Contrast Result Date:  09/30/2023 CLINICAL DATA:  Status post fall. EXAM: CT HEAD WITHOUT CONTRAST CT CERVICAL SPINE WITHOUT CONTRAST TECHNIQUE: Multidetector CT imaging of the head and cervical spine was performed following the standard protocol without intravenous contrast. Multiplanar CT image reconstructions of the cervical spine were also generated. RADIATION DOSE REDUCTION: This exam was performed according to the departmental dose-optimization program which includes automated exposure control, adjustment of the mA and/or kV according to patient size and/or use of iterative reconstruction technique. COMPARISON:  Brain MRI 07/08/2023 FINDINGS: CT HEAD FINDINGS Brain: Multiple areas of subarachnoid hyperdensity are noted overlying the left cerebral hemisphere. The largest overlies the left parietal convexity measuring 1.5 by 1.1 cm, image 29/6. Over the left parietal convexity there are 2 smaller foci of subarachnoid hyperdensity which measure around 5-6 mm, image 27/3. Along the left posterior parietal lobe there is focal area of subarachnoid hyperdensity, image 25/6. Tiny focus of hyperdensity over the right parietal convexity is also noted, image 29/6. No midline shift.  No intraventricular hemorrhage or hydrocephalus. No subdural hematoma identified. No signs of acute brain infarct. There is mild diffuse low-attenuation within the subcortical and periventricular white matter compatible with chronic microvascular disease. Prominence of the sulci and ventricles compatible with brain atrophy. Vascular: No hyperdense vessel or unexpected calcification. Skull: No acute fracture. Lucent bone lesions within the right-side of the base of skull is noted, image 25/9. Sinuses/Orbits: No acute finding. Other: None. CT CERVICAL SPINE FINDINGS Alignment: Normal. Skull base and vertebrae: Permeative appearance of the cervical vertebra and skull base identified compatible known diffuse lytic bone metastases. There is an age indeterminate fracture  deformity involving the left side of the T1 vertebra, image 43/7. Similarly within the left side of the C6 vertebra there is an expansile lytic lesion with pathologic fracture through the superior endplate and left lateral mass, image 30/5. Soft tissues and spinal canal: No prevertebral fluid or swelling. No visible canal hematoma. Disc levels:  Multilevel disc space narrowing and endplate spurring. Upper chest: Partially visualized tumor encasing the left upper lobe is identified. Extensive lytic bone metastases are identified involving the visualized portions of the ribs and upper thoracic spine. Multiple pulmonary nodules identified within the right lung. Other: None IMPRESSION: 1. Multiple areas of subarachnoid hyperdensity are noted overlying the left cerebral hemisphere. The largest overlies the left parietal convexity measuring 1.5 x 1.1 cm. Findings are compatible with acute subarachnoid hemorrhage. 2. Tiny focus of hyperdensity over the right parietal convexity is also noted. This may represent a tiny focus of subarachnoid hemorrhage. 3. Permeative appearance of the cervical vertebra and skull base compatible with known diffuse lytic bone metastases. 4. Age indeterminate pathologic fractures involve the C6 and T1 vertebra. No significant vertebral body height loss at these levels. If there is a clinical concern for tumor involvement of the cervical canal then a contrast enhanced MRI of the cervical spine would be advised. 5. Partially visualized tumor encasing the left upper lobe is identified. Extensive lytic bone metastases are  identified involving the visualized portions of the ribs and upper thoracic spine. Multiple pulmonary nodules identified within the right lung. 6. Chronic microvascular disease and brain atrophy. Critical Value/emergent results were called by telephone at the time of interpretation on 09/30/2023 at 11:00 am to provider [facility] , who verbally acknowledged these results.  Electronically Signed   By: Kimberley Penman M.D.   On: 09/30/2023 11:01   CT Cervical Spine Wo Contrast Result Date: 09/30/2023 CLINICAL DATA:  Status post fall. EXAM: CT HEAD WITHOUT CONTRAST CT CERVICAL SPINE WITHOUT CONTRAST TECHNIQUE: Multidetector CT imaging of the head and cervical spine was performed following the standard protocol without intravenous contrast. Multiplanar CT image reconstructions of the cervical spine were also generated. RADIATION DOSE REDUCTION: This exam was performed according to the departmental dose-optimization program which includes automated exposure control, adjustment of the mA and/or kV according to patient size and/or use of iterative reconstruction technique. COMPARISON:  Brain MRI 07/08/2023 FINDINGS: CT HEAD FINDINGS Brain: Multiple areas of subarachnoid hyperdensity are noted overlying the left cerebral hemisphere. The largest overlies the left parietal convexity measuring 1.5 by 1.1 cm, image 29/6. Over the left parietal convexity there are 2 smaller foci of subarachnoid hyperdensity which measure around 5-6 mm, image 27/3. Along the left posterior parietal lobe there is focal area of subarachnoid hyperdensity, image 25/6. Tiny focus of hyperdensity over the right parietal convexity is also noted, image 29/6. No midline shift.  No intraventricular hemorrhage or hydrocephalus. No subdural hematoma identified. No signs of acute brain infarct. There is mild diffuse low-attenuation within the subcortical and periventricular white matter compatible with chronic microvascular disease. Prominence of the sulci and ventricles compatible with brain atrophy. Vascular: No hyperdense vessel or unexpected calcification. Skull: No acute fracture. Lucent bone lesions within the right-side of the base of skull is noted, image 25/9. Sinuses/Orbits: No acute finding. Other: None. CT CERVICAL SPINE FINDINGS Alignment: Normal. Skull base and vertebrae: Permeative appearance of the cervical  vertebra and skull base identified compatible known diffuse lytic bone metastases. There is an age indeterminate fracture deformity involving the left side of the T1 vertebra, image 43/7. Similarly within the left side of the C6 vertebra there is an expansile lytic lesion with pathologic fracture through the superior endplate and left lateral mass, image 30/5. Soft tissues and spinal canal: No prevertebral fluid or swelling. No visible canal hematoma. Disc levels:  Multilevel disc space narrowing and endplate spurring. Upper chest: Partially visualized tumor encasing the left upper lobe is identified. Extensive lytic bone metastases are identified involving the visualized portions of the ribs and upper thoracic spine. Multiple pulmonary nodules identified within the right lung. Other: None IMPRESSION: 1. Multiple areas of subarachnoid hyperdensity are noted overlying the left cerebral hemisphere. The largest overlies the left parietal convexity measuring 1.5 x 1.1 cm. Findings are compatible with acute subarachnoid hemorrhage. 2. Tiny focus of hyperdensity over the right parietal convexity is also noted. This may represent a tiny focus of subarachnoid hemorrhage. 3. Permeative appearance of the cervical vertebra and skull base compatible with known diffuse lytic bone metastases. 4. Age indeterminate pathologic fractures involve the C6 and T1 vertebra. No significant vertebral body height loss at these levels. If there is a clinical concern for tumor involvement of the cervical canal then a contrast enhanced MRI of the cervical spine would be advised. 5. Partially visualized tumor encasing the left upper lobe is identified. Extensive lytic bone metastases are identified involving the visualized portions of the ribs and upper  thoracic spine. Multiple pulmonary nodules identified within the right lung. 6. Chronic microvascular disease and brain atrophy. Critical Value/emergent results were called by telephone at the  time of interpretation on 09/30/2023 at 11:00 am to provider [facility] , who verbally acknowledged these results. Electronically Signed   By: Kimberley Penman M.D.   On: 09/30/2023 11:01    ASSESSMENT AND PLAN: This is a very pleasant 73 years old African-American male with a stage IV non-small cell lung cancer, adenocarcinoma diagnosed in December 2024.  His molecular studies showed no actionable mutations with negative PD-L1 expression.  He started first-line treatment with carboplatin , paclitaxel  and Keytruda for 3 cycles.  Unfortunately the patient had evidence for disease progression after the third cycle of his treatment. He started second line treatment with docetaxel  and ramucirumab  status post 1 cycle.  He has a rough time after the first cycle of his treatment and was admitted to the hospital after a fall and was found to have hypotension and weakness.  He had head injury after the fall resulting acute subarachnoid hemorrhage.  Ramucirumab  was discontinued after cycle #1 secondary to the most recent acute subarachnoid hemorrhage.    Adenocarcinoma of lung Adenocarcinoma of the lung diagnosed in December 2024. Initial treatment with carboplatin , paclitaxel , and pembrolizumab. Disease progression led to second-line treatment with docetaxel  and ramucirumab . Current plan to continue with docetaxel  only due to subarachnoid hemorrhage. Hemoglobin improved, no transfusion needed. He feels stronger and ready to resume treatment. - Administer reduced dose of docetaxel . - Administer Neulasta  injection in two days. - Ensure administration of Decadron  pre, during, and post chemotherapy. - Schedule follow-up in three weeks.  Subarachnoid hemorrhage Subarachnoid hemorrhage secondary to a fall. Recent hospitalization for fall, syncopal episode, and weakness. Ramucirumab  discontinued due to potential vascular effects that could exacerbate hemorrhage.   The patient was advised to call immediately if he has  any concerning symptoms in the interval. The patient voices understanding of current disease status and treatment options and is in agreement with the current care plan.  All questions were answered. The patient knows to call the clinic with any problems, questions or concerns. We can certainly see the patient much sooner if necessary.  The total time spent in the appointment was 30 minutes.  Disclaimer: This note was dictated with voice recognition software. Similar sounding words can inadvertently be transcribed and may not be corrected upon review.

## 2023-10-25 NOTE — Telephone Encounter (Signed)
 Attempted to contact patient regarding recent lab results. Per Cassie, PA, B12 levels are elevated. Attempted to reach patient to inquire whether he is currently taking a multivitamin containing B12, a B-complex supplement, or over-the-counter B12.  Requested a return call with this information.

## 2023-10-26 ENCOUNTER — Inpatient Hospital Stay

## 2023-10-26 DIAGNOSIS — C799 Secondary malignant neoplasm of unspecified site: Secondary | ICD-10-CM | POA: Diagnosis not present

## 2023-10-26 DIAGNOSIS — R531 Weakness: Secondary | ICD-10-CM | POA: Diagnosis not present

## 2023-10-26 DIAGNOSIS — E43 Unspecified severe protein-calorie malnutrition: Secondary | ICD-10-CM | POA: Diagnosis not present

## 2023-10-26 DIAGNOSIS — G893 Neoplasm related pain (acute) (chronic): Secondary | ICD-10-CM | POA: Diagnosis not present

## 2023-10-26 DIAGNOSIS — C3490 Malignant neoplasm of unspecified part of unspecified bronchus or lung: Secondary | ICD-10-CM | POA: Diagnosis not present

## 2023-10-26 DIAGNOSIS — Z7409 Other reduced mobility: Secondary | ICD-10-CM | POA: Diagnosis not present

## 2023-10-27 ENCOUNTER — Inpatient Hospital Stay

## 2023-10-27 VITALS — BP 100/65 | HR 110 | Temp 98.3°F

## 2023-10-27 DIAGNOSIS — R627 Adult failure to thrive: Secondary | ICD-10-CM | POA: Diagnosis present

## 2023-10-27 DIAGNOSIS — R0689 Other abnormalities of breathing: Secondary | ICD-10-CM | POA: Diagnosis not present

## 2023-10-27 DIAGNOSIS — Z711 Person with feared health complaint in whom no diagnosis is made: Secondary | ICD-10-CM | POA: Diagnosis not present

## 2023-10-27 DIAGNOSIS — C3492 Malignant neoplasm of unspecified part of left bronchus or lung: Secondary | ICD-10-CM | POA: Diagnosis not present

## 2023-10-27 DIAGNOSIS — D696 Thrombocytopenia, unspecified: Secondary | ICD-10-CM | POA: Diagnosis present

## 2023-10-27 DIAGNOSIS — R0902 Hypoxemia: Secondary | ICD-10-CM | POA: Diagnosis not present

## 2023-10-27 DIAGNOSIS — C7951 Secondary malignant neoplasm of bone: Secondary | ICD-10-CM

## 2023-10-27 DIAGNOSIS — Z48813 Encounter for surgical aftercare following surgery on the respiratory system: Secondary | ICD-10-CM | POA: Diagnosis not present

## 2023-10-27 DIAGNOSIS — R000 Tachycardia, unspecified: Secondary | ICD-10-CM | POA: Diagnosis not present

## 2023-10-27 DIAGNOSIS — E162 Hypoglycemia, unspecified: Secondary | ICD-10-CM | POA: Diagnosis not present

## 2023-10-27 DIAGNOSIS — J948 Other specified pleural conditions: Secondary | ICD-10-CM | POA: Diagnosis present

## 2023-10-27 DIAGNOSIS — E872 Acidosis, unspecified: Secondary | ICD-10-CM | POA: Diagnosis present

## 2023-10-27 DIAGNOSIS — Z66 Do not resuscitate: Secondary | ICD-10-CM | POA: Diagnosis not present

## 2023-10-27 DIAGNOSIS — F419 Anxiety disorder, unspecified: Secondary | ICD-10-CM | POA: Diagnosis present

## 2023-10-27 DIAGNOSIS — J811 Chronic pulmonary edema: Secondary | ICD-10-CM | POA: Diagnosis present

## 2023-10-27 DIAGNOSIS — R5383 Other fatigue: Secondary | ICD-10-CM | POA: Diagnosis not present

## 2023-10-27 DIAGNOSIS — E43 Unspecified severe protein-calorie malnutrition: Secondary | ICD-10-CM | POA: Diagnosis not present

## 2023-10-27 DIAGNOSIS — C799 Secondary malignant neoplasm of unspecified site: Secondary | ICD-10-CM | POA: Diagnosis not present

## 2023-10-27 DIAGNOSIS — R0602 Shortness of breath: Secondary | ICD-10-CM | POA: Diagnosis present

## 2023-10-27 DIAGNOSIS — Z794 Long term (current) use of insulin: Secondary | ICD-10-CM | POA: Diagnosis not present

## 2023-10-27 DIAGNOSIS — J910 Malignant pleural effusion: Secondary | ICD-10-CM | POA: Diagnosis present

## 2023-10-27 DIAGNOSIS — S066X0D Traumatic subarachnoid hemorrhage without loss of consciousness, subsequent encounter: Secondary | ICD-10-CM | POA: Diagnosis not present

## 2023-10-27 DIAGNOSIS — Z7409 Other reduced mobility: Secondary | ICD-10-CM | POA: Diagnosis not present

## 2023-10-27 DIAGNOSIS — E1165 Type 2 diabetes mellitus with hyperglycemia: Secondary | ICD-10-CM | POA: Diagnosis present

## 2023-10-27 DIAGNOSIS — M84552D Pathological fracture in neoplastic disease, left femur, subsequent encounter for fracture with routine healing: Secondary | ICD-10-CM | POA: Diagnosis not present

## 2023-10-27 DIAGNOSIS — C7801 Secondary malignant neoplasm of right lung: Secondary | ICD-10-CM | POA: Diagnosis not present

## 2023-10-27 DIAGNOSIS — G893 Neoplasm related pain (acute) (chronic): Secondary | ICD-10-CM | POA: Diagnosis present

## 2023-10-27 DIAGNOSIS — C3490 Malignant neoplasm of unspecified part of unspecified bronchus or lung: Secondary | ICD-10-CM | POA: Diagnosis present

## 2023-10-27 DIAGNOSIS — Z7189 Other specified counseling: Secondary | ICD-10-CM | POA: Diagnosis not present

## 2023-10-27 DIAGNOSIS — D649 Anemia, unspecified: Secondary | ICD-10-CM | POA: Diagnosis not present

## 2023-10-27 DIAGNOSIS — C801 Malignant (primary) neoplasm, unspecified: Secondary | ICD-10-CM | POA: Diagnosis not present

## 2023-10-27 DIAGNOSIS — J90 Pleural effusion, not elsewhere classified: Secondary | ICD-10-CM | POA: Diagnosis not present

## 2023-10-27 DIAGNOSIS — I609 Nontraumatic subarachnoid hemorrhage, unspecified: Secondary | ICD-10-CM | POA: Diagnosis present

## 2023-10-27 DIAGNOSIS — C7802 Secondary malignant neoplasm of left lung: Secondary | ICD-10-CM | POA: Diagnosis not present

## 2023-10-27 DIAGNOSIS — I959 Hypotension, unspecified: Secondary | ICD-10-CM | POA: Diagnosis not present

## 2023-10-27 DIAGNOSIS — I10 Essential (primary) hypertension: Secondary | ICD-10-CM | POA: Diagnosis present

## 2023-10-27 DIAGNOSIS — C3412 Malignant neoplasm of upper lobe, left bronchus or lung: Secondary | ICD-10-CM | POA: Diagnosis not present

## 2023-10-27 DIAGNOSIS — R4589 Other symptoms and signs involving emotional state: Secondary | ICD-10-CM | POA: Diagnosis not present

## 2023-10-27 DIAGNOSIS — J439 Emphysema, unspecified: Secondary | ICD-10-CM | POA: Diagnosis not present

## 2023-10-27 DIAGNOSIS — J939 Pneumothorax, unspecified: Secondary | ICD-10-CM | POA: Diagnosis not present

## 2023-10-27 DIAGNOSIS — N179 Acute kidney failure, unspecified: Secondary | ICD-10-CM | POA: Diagnosis present

## 2023-10-27 DIAGNOSIS — J95811 Postprocedural pneumothorax: Secondary | ICD-10-CM | POA: Diagnosis not present

## 2023-10-27 DIAGNOSIS — J9601 Acute respiratory failure with hypoxia: Secondary | ICD-10-CM | POA: Diagnosis present

## 2023-10-27 DIAGNOSIS — C779 Secondary and unspecified malignant neoplasm of lymph node, unspecified: Secondary | ICD-10-CM | POA: Diagnosis present

## 2023-10-27 DIAGNOSIS — Z79899 Other long term (current) drug therapy: Secondary | ICD-10-CM | POA: Diagnosis not present

## 2023-10-27 DIAGNOSIS — D509 Iron deficiency anemia, unspecified: Secondary | ICD-10-CM | POA: Diagnosis present

## 2023-10-27 DIAGNOSIS — R531 Weakness: Secondary | ICD-10-CM | POA: Diagnosis not present

## 2023-10-27 DIAGNOSIS — J984 Other disorders of lung: Secondary | ICD-10-CM | POA: Diagnosis not present

## 2023-10-27 DIAGNOSIS — Z681 Body mass index (BMI) 19 or less, adult: Secondary | ICD-10-CM | POA: Diagnosis not present

## 2023-10-27 DIAGNOSIS — Z7401 Bed confinement status: Secondary | ICD-10-CM | POA: Diagnosis not present

## 2023-10-27 DIAGNOSIS — E11649 Type 2 diabetes mellitus with hypoglycemia without coma: Secondary | ICD-10-CM | POA: Diagnosis present

## 2023-10-27 DIAGNOSIS — R918 Other nonspecific abnormal finding of lung field: Secondary | ICD-10-CM | POA: Diagnosis not present

## 2023-10-27 DIAGNOSIS — L89153 Pressure ulcer of sacral region, stage 3: Secondary | ICD-10-CM | POA: Diagnosis present

## 2023-10-27 DIAGNOSIS — Z515 Encounter for palliative care: Secondary | ICD-10-CM | POA: Diagnosis not present

## 2023-10-27 MED ORDER — PEGFILGRASTIM-CBQV 6 MG/0.6ML ~~LOC~~ SOSY
6.0000 mg | PREFILLED_SYRINGE | Freq: Once | SUBCUTANEOUS | Status: AC
  Administered 2023-10-27: 6 mg via SUBCUTANEOUS
  Filled 2023-10-27: qty 0.6

## 2023-10-28 ENCOUNTER — Ambulatory Visit

## 2023-10-29 DIAGNOSIS — E43 Unspecified severe protein-calorie malnutrition: Secondary | ICD-10-CM | POA: Diagnosis not present

## 2023-10-29 DIAGNOSIS — C799 Secondary malignant neoplasm of unspecified site: Secondary | ICD-10-CM | POA: Diagnosis not present

## 2023-10-29 DIAGNOSIS — R531 Weakness: Secondary | ICD-10-CM | POA: Diagnosis not present

## 2023-10-29 DIAGNOSIS — G893 Neoplasm related pain (acute) (chronic): Secondary | ICD-10-CM | POA: Diagnosis not present

## 2023-10-29 DIAGNOSIS — Z7409 Other reduced mobility: Secondary | ICD-10-CM | POA: Diagnosis not present

## 2023-10-29 DIAGNOSIS — R627 Adult failure to thrive: Secondary | ICD-10-CM | POA: Diagnosis not present

## 2023-10-30 ENCOUNTER — Emergency Department (HOSPITAL_COMMUNITY)

## 2023-10-30 ENCOUNTER — Encounter (HOSPITAL_COMMUNITY): Payer: Self-pay | Admitting: Family Medicine

## 2023-10-30 ENCOUNTER — Inpatient Hospital Stay (HOSPITAL_COMMUNITY)
Admission: EM | Admit: 2023-10-30 | Discharge: 2023-11-16 | DRG: 189 | Discharge status: Hospice | Attending: Family Medicine | Admitting: Family Medicine

## 2023-10-30 ENCOUNTER — Other Ambulatory Visit: Payer: Self-pay

## 2023-10-30 ENCOUNTER — Telehealth: Payer: Self-pay

## 2023-10-30 ENCOUNTER — Inpatient Hospital Stay (HOSPITAL_COMMUNITY)

## 2023-10-30 DIAGNOSIS — D539 Nutritional anemia, unspecified: Secondary | ICD-10-CM | POA: Diagnosis present

## 2023-10-30 DIAGNOSIS — E11649 Type 2 diabetes mellitus with hypoglycemia without coma: Secondary | ICD-10-CM | POA: Diagnosis present

## 2023-10-30 DIAGNOSIS — F419 Anxiety disorder, unspecified: Secondary | ICD-10-CM | POA: Diagnosis present

## 2023-10-30 DIAGNOSIS — D696 Thrombocytopenia, unspecified: Secondary | ICD-10-CM | POA: Diagnosis present

## 2023-10-30 DIAGNOSIS — C7951 Secondary malignant neoplasm of bone: Secondary | ICD-10-CM | POA: Diagnosis present

## 2023-10-30 DIAGNOSIS — D649 Anemia, unspecified: Secondary | ICD-10-CM | POA: Diagnosis not present

## 2023-10-30 DIAGNOSIS — J910 Malignant pleural effusion: Secondary | ICD-10-CM | POA: Insufficient documentation

## 2023-10-30 DIAGNOSIS — Z87891 Personal history of nicotine dependence: Secondary | ICD-10-CM

## 2023-10-30 DIAGNOSIS — Z711 Person with feared health complaint in whom no diagnosis is made: Secondary | ICD-10-CM | POA: Diagnosis not present

## 2023-10-30 DIAGNOSIS — E872 Acidosis, unspecified: Secondary | ICD-10-CM | POA: Diagnosis present

## 2023-10-30 DIAGNOSIS — Z7984 Long term (current) use of oral hypoglycemic drugs: Secondary | ICD-10-CM

## 2023-10-30 DIAGNOSIS — J948 Other specified pleural conditions: Secondary | ICD-10-CM | POA: Diagnosis present

## 2023-10-30 DIAGNOSIS — J811 Chronic pulmonary edema: Secondary | ICD-10-CM | POA: Diagnosis present

## 2023-10-30 DIAGNOSIS — Z833 Family history of diabetes mellitus: Secondary | ICD-10-CM

## 2023-10-30 DIAGNOSIS — N179 Acute kidney failure, unspecified: Secondary | ICD-10-CM | POA: Diagnosis present

## 2023-10-30 DIAGNOSIS — J95811 Postprocedural pneumothorax: Secondary | ICD-10-CM | POA: Diagnosis not present

## 2023-10-30 DIAGNOSIS — E43 Unspecified severe protein-calorie malnutrition: Secondary | ICD-10-CM | POA: Diagnosis not present

## 2023-10-30 DIAGNOSIS — Z9221 Personal history of antineoplastic chemotherapy: Secondary | ICD-10-CM

## 2023-10-30 DIAGNOSIS — R531 Weakness: Secondary | ICD-10-CM | POA: Diagnosis not present

## 2023-10-30 DIAGNOSIS — Z515 Encounter for palliative care: Secondary | ICD-10-CM | POA: Diagnosis not present

## 2023-10-30 DIAGNOSIS — J90 Pleural effusion, not elsewhere classified: Secondary | ICD-10-CM | POA: Diagnosis not present

## 2023-10-30 DIAGNOSIS — Z681 Body mass index (BMI) 19 or less, adult: Secondary | ICD-10-CM | POA: Diagnosis not present

## 2023-10-30 DIAGNOSIS — R5383 Other fatigue: Secondary | ICD-10-CM | POA: Diagnosis not present

## 2023-10-30 DIAGNOSIS — R4589 Other symptoms and signs involving emotional state: Secondary | ICD-10-CM

## 2023-10-30 DIAGNOSIS — E162 Hypoglycemia, unspecified: Secondary | ICD-10-CM | POA: Diagnosis not present

## 2023-10-30 DIAGNOSIS — D630 Anemia in neoplastic disease: Secondary | ICD-10-CM | POA: Diagnosis present

## 2023-10-30 DIAGNOSIS — R0602 Shortness of breath: Secondary | ICD-10-CM | POA: Diagnosis present

## 2023-10-30 DIAGNOSIS — Z66 Do not resuscitate: Secondary | ICD-10-CM | POA: Diagnosis not present

## 2023-10-30 DIAGNOSIS — R627 Adult failure to thrive: Secondary | ICD-10-CM | POA: Diagnosis present

## 2023-10-30 DIAGNOSIS — Z79899 Other long term (current) drug therapy: Secondary | ICD-10-CM

## 2023-10-30 DIAGNOSIS — L89153 Pressure ulcer of sacral region, stage 3: Secondary | ICD-10-CM | POA: Diagnosis present

## 2023-10-30 DIAGNOSIS — J9601 Acute respiratory failure with hypoxia: Secondary | ICD-10-CM | POA: Diagnosis present

## 2023-10-30 DIAGNOSIS — C3490 Malignant neoplasm of unspecified part of unspecified bronchus or lung: Secondary | ICD-10-CM | POA: Diagnosis present

## 2023-10-30 DIAGNOSIS — J939 Pneumothorax, unspecified: Secondary | ICD-10-CM | POA: Insufficient documentation

## 2023-10-30 DIAGNOSIS — E1165 Type 2 diabetes mellitus with hyperglycemia: Secondary | ICD-10-CM | POA: Diagnosis present

## 2023-10-30 DIAGNOSIS — I509 Heart failure, unspecified: Secondary | ICD-10-CM | POA: Insufficient documentation

## 2023-10-30 DIAGNOSIS — I959 Hypotension, unspecified: Secondary | ICD-10-CM | POA: Diagnosis present

## 2023-10-30 DIAGNOSIS — C3412 Malignant neoplasm of upper lobe, left bronchus or lung: Secondary | ICD-10-CM | POA: Diagnosis not present

## 2023-10-30 DIAGNOSIS — I10 Essential (primary) hypertension: Secondary | ICD-10-CM | POA: Diagnosis present

## 2023-10-30 DIAGNOSIS — C799 Secondary malignant neoplasm of unspecified site: Secondary | ICD-10-CM | POA: Diagnosis not present

## 2023-10-30 DIAGNOSIS — Z7189 Other specified counseling: Principal | ICD-10-CM

## 2023-10-30 DIAGNOSIS — L8990 Pressure ulcer of unspecified site, unspecified stage: Secondary | ICD-10-CM | POA: Insufficient documentation

## 2023-10-30 DIAGNOSIS — C779 Secondary and unspecified malignant neoplasm of lymph node, unspecified: Secondary | ICD-10-CM | POA: Diagnosis present

## 2023-10-30 DIAGNOSIS — K5900 Constipation, unspecified: Secondary | ICD-10-CM | POA: Diagnosis present

## 2023-10-30 DIAGNOSIS — G893 Neoplasm related pain (acute) (chronic): Secondary | ICD-10-CM | POA: Diagnosis present

## 2023-10-30 DIAGNOSIS — R54 Age-related physical debility: Secondary | ICD-10-CM | POA: Diagnosis present

## 2023-10-30 DIAGNOSIS — Z794 Long term (current) use of insulin: Secondary | ICD-10-CM | POA: Diagnosis not present

## 2023-10-30 DIAGNOSIS — D509 Iron deficiency anemia, unspecified: Secondary | ICD-10-CM | POA: Diagnosis present

## 2023-10-30 DIAGNOSIS — I609 Nontraumatic subarachnoid hemorrhage, unspecified: Secondary | ICD-10-CM | POA: Diagnosis present

## 2023-10-30 DIAGNOSIS — R000 Tachycardia, unspecified: Secondary | ICD-10-CM | POA: Diagnosis not present

## 2023-10-30 DIAGNOSIS — R636 Underweight: Secondary | ICD-10-CM | POA: Diagnosis present

## 2023-10-30 LAB — CBC WITH DIFFERENTIAL/PLATELET
Abs Immature Granulocytes: 0.47 10*3/uL — ABNORMAL HIGH (ref 0.00–0.07)
Basophils Absolute: 0.1 10*3/uL (ref 0.0–0.1)
Basophils Relative: 0 %
Eosinophils Absolute: 0 10*3/uL (ref 0.0–0.5)
Eosinophils Relative: 0 %
HCT: 26.5 % — ABNORMAL LOW (ref 39.0–52.0)
Hemoglobin: 8 g/dL — ABNORMAL LOW (ref 13.0–17.0)
Immature Granulocytes: 3 %
Lymphocytes Relative: 8 %
Lymphs Abs: 1.1 10*3/uL (ref 0.7–4.0)
MCH: 34 pg (ref 26.0–34.0)
MCHC: 30.2 g/dL (ref 30.0–36.0)
MCV: 112.8 fL — ABNORMAL HIGH (ref 80.0–100.0)
Monocytes Absolute: 0.6 10*3/uL (ref 0.1–1.0)
Monocytes Relative: 4 %
Neutro Abs: 12.1 10*3/uL — ABNORMAL HIGH (ref 1.7–7.7)
Neutrophils Relative %: 85 %
Platelets: 128 10*3/uL — ABNORMAL LOW (ref 150–400)
RBC: 2.35 MIL/uL — ABNORMAL LOW (ref 4.22–5.81)
RDW: 22 % — ABNORMAL HIGH (ref 11.5–15.5)
WBC: 14.3 10*3/uL — ABNORMAL HIGH (ref 4.0–10.5)
nRBC: 0.2 % (ref 0.0–0.2)

## 2023-10-30 LAB — COMPREHENSIVE METABOLIC PANEL WITH GFR
ALT: 9 U/L (ref 0–44)
AST: 14 U/L — ABNORMAL LOW (ref 15–41)
Albumin: 2.8 g/dL — ABNORMAL LOW (ref 3.5–5.0)
Alkaline Phosphatase: 164 U/L — ABNORMAL HIGH (ref 38–126)
Anion gap: 13 (ref 5–15)
BUN: 57 mg/dL — ABNORMAL HIGH (ref 8–23)
CO2: 19 mmol/L — ABNORMAL LOW (ref 22–32)
Calcium: 9 mg/dL (ref 8.9–10.3)
Chloride: 110 mmol/L (ref 98–111)
Creatinine, Ser: 1.77 mg/dL — ABNORMAL HIGH (ref 0.61–1.24)
GFR, Estimated: 40 mL/min — ABNORMAL LOW (ref >60)
Glucose, Bld: 49 mg/dL — ABNORMAL LOW (ref 70–99)
Potassium: 4.3 mmol/L (ref 3.5–5.1)
Sodium: 142 mmol/L (ref 135–145)
Total Bilirubin: 0.7 mg/dL (ref 0.0–1.2)
Total Protein: 6 g/dL — ABNORMAL LOW (ref 6.5–8.1)

## 2023-10-30 LAB — BRAIN NATRIURETIC PEPTIDE: B Natriuretic Peptide: 269.3 pg/mL — ABNORMAL HIGH (ref 0.0–100.0)

## 2023-10-30 MED ORDER — FUROSEMIDE 10 MG/ML IJ SOLN
20.0000 mg | Freq: Once | INTRAMUSCULAR | Status: AC
Start: 2023-10-30 — End: 2023-10-30
  Administered 2023-10-30: 20 mg via INTRAVENOUS
  Filled 2023-10-30: qty 4

## 2023-10-30 MED ORDER — SENNA 8.6 MG PO TABS: 1.0000 | ORAL_TABLET | Freq: Every day | ORAL | Status: DC | PRN

## 2023-10-30 MED ORDER — ACETAMINOPHEN 650 MG RE SUPP: 650.0000 mg | Freq: Four times a day (QID) | RECTAL | Status: DC | PRN

## 2023-10-30 MED ORDER — ACETAMINOPHEN 325 MG PO TABS
650.0000 mg | ORAL_TABLET | Freq: Four times a day (QID) | ORAL | Status: DC | PRN
  Administered 2023-11-03 – 2023-11-13 (×5): 650 mg via ORAL
  Filled 2023-10-30 (×6): qty 2

## 2023-10-30 MED ORDER — SODIUM CHLORIDE (PF) 0.9 % IJ SOLN
INTRAMUSCULAR | Status: AC
  Filled 2023-10-30: qty 50

## 2023-10-30 MED ORDER — IOHEXOL 350 MG/ML SOLN
60.0000 mL | Freq: Once | INTRAVENOUS | Status: AC | PRN
  Administered 2023-10-30: 60 mL via INTRAVENOUS

## 2023-10-30 MED ORDER — INSULIN ASPART 100 UNIT/ML IJ SOLN
0.0000 [IU] | Freq: Three times a day (TID) | INTRAMUSCULAR | Status: DC
  Administered 2023-10-31: 2 [IU] via SUBCUTANEOUS
  Administered 2023-10-31 – 2023-11-01 (×4): 1 [IU] via SUBCUTANEOUS
  Filled 2023-10-30: qty 0.06

## 2023-10-30 MED ORDER — OXYCODONE HCL 5 MG PO TABS: 10.0000 mg | ORAL_TABLET | ORAL | Status: DC | PRN

## 2023-10-30 MED ORDER — GABAPENTIN 100 MG PO CAPS
300.0000 mg | ORAL_CAPSULE | Freq: Every day | ORAL | Status: DC
  Administered 2023-10-31 – 2023-11-14 (×13): 300 mg via ORAL
  Filled 2023-10-30 (×3): qty 3
  Filled 2023-10-30 (×2): qty 1
  Filled 2023-10-30 (×11): qty 3

## 2023-10-30 MED ORDER — INSULIN ASPART 100 UNIT/ML IJ SOLN
0.0000 [IU] | Freq: Every day | INTRAMUSCULAR | Status: DC
  Administered 2023-10-31: 2 [IU] via SUBCUTANEOUS
  Filled 2023-10-30: qty 0.05

## 2023-10-30 MED ORDER — PROCHLORPERAZINE EDISYLATE 10 MG/2ML IJ SOLN: 5.0000 mg | Freq: Four times a day (QID) | INTRAMUSCULAR | Status: DC | PRN

## 2023-10-30 MED ORDER — HEPARIN SODIUM (PORCINE) 5000 UNIT/ML IJ SOLN: 5000.0000 [IU] | Freq: Three times a day (TID) | INTRAMUSCULAR | Status: DC

## 2023-10-30 MED ORDER — SODIUM CHLORIDE 0.9% FLUSH
3.0000 mL | Freq: Two times a day (BID) | INTRAVENOUS | Status: DC
  Administered 2023-10-31 – 2023-11-15 (×30): 3 mL via INTRAVENOUS

## 2023-10-30 NOTE — ED Triage Notes (Signed)
 Patient arrived with complaints of shortness of breath and weakness. 85-86% O2, does not wear oxygen at baseline. Given 750cc NS prior to arrival.

## 2023-10-30 NOTE — H&P (Signed)
 History and Physical    MANVILLE RICO WUJ:[id number] DOB: [birth year] DOA: 10/30/2023  PCP: [name], NP   Patient coming from: SNF   Chief Complaint: SOB, fatigue   HPI: Jonathan Page is a 73 y.o. male with medical history significant for metastatic non-small cell lung cancer, hypertension, insulin -dependent diabetes mellitus, deconditioning, and fall with subarachnoid hemorrhage 1 month ago, now presenting with shortness of breath and fatigue.  Patient reports worsening shortness of breath and fatigue.  He first noticed increased dyspnea and fatigue with exertion, but is now feeling short of breath even at rest. He denies cough, chest pain, or fevers.   ED Course: Upon arrival to the ED, patient is found to be afebrile and saturating upper 80s to mid 90s on 3 L/min of supplemental oxygen with elevated HR, elevated RR, and SBP 88 and greater.  Labs are most notable for BUN 57, creatinine 1.77, albumin 2.8, glucose 49, WBC 14,300, hemoglobin 8.0, platelets 1 28,000, and BNP 269.  Chest x-ray is concerning for small right pleural effusion, small to moderate left pleural effusion and new or worsening right-sided infiltrates or edema, increased density of left upper lobe opacity, and persistent LLL consolidation.  Patient was placed on supplemental oxygen, blood cultures were collected, and he was given 20 mg IV Lasix .  Review of Systems:  All other systems reviewed and apart from HPI, are negative.  Past Medical History:  Diagnosis Date   Allergy    Cancer (HCC)    Cellulitis 02/21/2018   Concussion syndrome    Diabetes (HCC)    Frequent headaches    Hypertension     Past Surgical History:  Procedure Laterality Date   APPENDECTOMY  1960   BRONCHIAL BIOPSY  06/12/2023   Procedure: BRONCHIAL BIOPSIES;  Surgeon: Prudy Brownie, DO;  Location: [facility];  Service: Cardiopulmonary;;   BRONCHIAL BRUSHINGS  06/12/2023   Procedure: BRONCHIAL BRUSHINGS;  Surgeon: Prudy Brownie, DO;  Location: [facility];  Service: Cardiopulmonary;;   COLONOSCOPY     FEMUR IM NAIL Left 07/25/2023   Procedure: LEFT HIP INTRAMEDULLARY HIP SCREW;  Surgeon: Jasmine Mesi, MD;  Location: [facility] OR;  Service: Orthopedics;  Laterality: Left;   HIP SURGERY Left 07/25/2023   metal pin placed in L hip area   IR IMAGING GUIDED PORT INSERTION  07/13/2023   VIDEO BRONCHOSCOPY Bilateral 06/12/2023   Procedure: VIDEO BRONCHOSCOPY;  Surgeon: Prudy Brownie, DO;  Location: [facility];  Service: Cardiopulmonary;  Laterality: Bilateral;   WISDOM TOOTH EXTRACTION      Social History:   reports that he quit smoking about 5 years ago. His smoking use included cigarettes and cigars. He started smoking about 35 years ago. He has a 15 pack-year smoking history. He has never used smokeless tobacco. He reports current alcohol use of about 6.0 standard drinks of alcohol per week. He reports that he does not use drugs.  Not on File  Family History  Problem Relation Age of Onset   Diabetes Father    Diabetes Paternal Grandfather    Diabetes Paternal Grandmother    Colon cancer Neg Hx    Crohn's disease Neg Hx    Rectal cancer Neg Hx    Stomach cancer Neg Hx    Esophageal cancer Neg Hx      Prior to Admission medications   Medication Sig Start Date End Date Taking? Authorizing Provider  Oxycodone  HCl 10 MG TABS Take 10 mg by mouth every 4 (  four) hours as needed. 10/30/23  Yes [provider]  Blood Glucose Monitoring Suppl (ACCU-CHEK AVIVA PLUS) w/Device KIT Check blood sugar before breakfast, lunch and bedtime and as directed. Dx E11.65 07/13/17   [name], NP  Blood Glucose Monitoring Suppl DEVI 1 each by Does not apply route as directed. May substitute to any manufacturer covered by patient's insurance. 08/23/23   Dorothe Gaster, NP  dexamethasone  (DECADRON ) 4 MG tablet Take 2 tabs by mouth 2 times daily starting day before chemo. Then take 2 tabs daily for 2 days  starting day after chemo. Take with food. Patient not taking: Reported on 10/02/2023 09/18/23   Marlene Simas, MD  diclofenac  Sodium (VOLTAREN ) 1 % GEL Apply 2 g topically 4 (four) times daily. 09/08/23   Magnant, Charles L, PA-C  docusate sodium  (COLACE) 100 MG capsule Take 1 capsule (100 mg total) by mouth 2 (two) times daily. Patient not taking: Reported on 10/02/2023 07/26/23   Magnant, Justice Olp, PA-C  feeding supplement (ENSURE ENLIVE / ENSURE PLUS) LIQD Take 237 mLs by mouth 2 (two) times daily between meals. 10/06/23   Lesa Rape, MD  folic acid  (FOLVITE ) 1 MG tablet Take 1 mg by mouth in the morning. Patient not taking: Reported on 10/02/2023    [provider]  gabapentin  (NEURONTIN ) 300 MG capsule Take 1 capsule (300 mg total) by mouth at bedtime. Patient taking differently: Take 300 mg by mouth in the morning. 07/03/23   Heilingoetter, Cassandra L, PA-C  glipiZIDE  (GLUCOTROL  XL) 10 MG 24 hr tablet Take 1 tablet (10 mg total) by mouth daily with breakfast. for diabetes. 05/29/23   Helaine Llanos, MD  glucose blood (ACCU-CHEK AVIVA PLUS) test strip Check blood sugar before breakfast, lunch and bedtime and as directed. Dx E11.65 08/30/23   Clark, Katherine K, NP  insulin  NPH-regular Human (70-30) 100 UNIT/ML injection Inject 5 Units into the skin daily with breakfast.    [provider]  Lancets (ACCU-CHEK SOFT TOUCH) lancets Check blood sugar before breakfast, lunch and bedtime and as directed. Dx E11.65 07/13/17   Clark, Katherine K, NP  lidocaine -prilocaine  (EMLA ) cream Apply 1 Application topically as needed. 06/29/23   Marlene Simas, MD  lisinopril  (ZESTRIL ) 10 MG tablet Take 1 tablet (10 mg total) by mouth daily. for blood pressure. 07/11/23   Clark, Katherine K, NP  methocarbamol  (ROBAXIN ) 500 MG tablet Take 1 tablet (500 mg total) by mouth every 8 (eight) hours as needed for muscle spasms. 10/19/23   Pickenpack-Cousar, Athena N, NP  ondansetron  (ZOFRAN ) 8 MG tablet Take  8 mg by mouth every 8 (eight) hours as needed for nausea or vomiting.    [provider]  oxyCODONE  (OXY IR/ROXICODONE ) 5 MG immediate release tablet Take 1-2 tablets (5-10 mg total) by mouth every 4 (four) hours as needed for moderate pain (pain score 4-6) or severe pain (pain score 7-10). 10/19/23   Pickenpack-Cousar, Athena N, NP  prochlorperazine  (COMPAZINE ) 10 MG tablet Take 1 tablet (10 mg total) by mouth every 6 (six) hours as needed for nausea or vomiting. 06/29/23   Marlene Simas, MD    Physical Exam: Vitals:   10/30/23 1934 10/30/23 1935 10/30/23 2000 10/30/23 2130  BP: (!) 88/63  (!) 92/51 91/70  Pulse: (!) 110  (!) 113 (!) 108  Resp: 17  (!) 25 (!) 27  Temp:  97.7 F (36.5 C)    TempSrc:  Oral    SpO2: 95%  (!) 89% 94%  Constitutional: NAD, frail-appearing  Eyes: PERTLA, lids and conjunctivae normal ENMT: Mucous membranes are moist. Posterior pharynx clear of any exudate or lesions.   Neck: supple, no masses  Respiratory: Diminished bilaterally, rales bilaterally, no wheezing. No accessory muscle use.  Cardiovascular: S1 & S2 heard, regular rate and rhythm. Bilateral ankle edema.   Abdomen: No tenderness, soft. Bowel sounds active.  Musculoskeletal: no clubbing / cyanosis. No joint deformity upper and lower extremities.   Skin: no significant rashes, lesions, ulcers. Warm, dry, well-perfused. Neurologic: CN 2-12 grossly intact. Moving all extremities. Sleeping. Wakes to voice and is oriented to person, place, and situation.  Psychiatric: Pleasant. Cooperative.    Labs and Imaging on Admission: I have personally reviewed following labs and imaging studies  CBC: Recent Labs  Lab 10/25/23 0921 10/30/23 1945  WBC 18.2* 14.3*  NEUTROABS 15.5* 12.1*  HGB 9.3* 8.0*  HCT 29.2* 26.5*  MCV 106.2* 112.8*  PLT 108* 128*   Basic Metabolic Panel: Recent Labs  Lab 10/25/23 0921 10/30/23 1945  NA 140 142  K 4.3 4.3  CL 108 110  CO2 24 19*  GLUCOSE 116* 49*   BUN 36* 57*  CREATININE 1.36* 1.77*  CALCIUM  9.0 9.0   GFR: CrCl cannot be calculated (Unknown ideal weight.). Liver Function Tests: Recent Labs  Lab 10/25/23 0921 10/30/23 1945  AST 12* 14*  ALT 6 9  ALKPHOS 155* 164*  BILITOT 0.7 0.7  PROT 6.2* 6.0*  ALBUMIN 3.5 2.8*   No results for input(s): "LIPASE", "AMYLASE" in the last 168 hours. No results for input(s): "AMMONIA" in the last 168 hours. Coagulation Profile: No results for input(s): "INR", "PROTIME" in the last 168 hours. Cardiac Enzymes: No results for input(s): "CKTOTAL", "CKMB", "CKMBINDEX", "TROPONINI" in the last 168 hours. BNP (last 3 results) No results for input(s): "PROBNP" in the last 8760 hours. HbA1C: No results for input(s): "HGBA1C" in the last 72 hours. CBG: No results for input(s): "GLUCAP" in the last 168 hours. Lipid Profile: No results for input(s): "CHOL", "HDL", "LDLCALC", "TRIG", "CHOLHDL", "LDLDIRECT" in the last 72 hours. Thyroid  Function Tests: No results for input(s): "TSH", "T4TOTAL", "FREET4", "T3FREE", "THYROIDAB" in the last 72 hours. Anemia Panel: No results for input(s): "VITAMINB12", "FOLATE", "FERRITIN", "TIBC", "IRON ", "RETICCTPCT" in the last 72 hours. Urine analysis:    Component Value Date/Time   COLORURINE YELLOW 04/20/2023 1945   APPEARANCEUR CLEAR 04/20/2023 1945   LABSPEC 1.019 04/20/2023 1945   PHURINE 5.0 04/20/2023 1945   GLUCOSEU NEGATIVE 04/20/2023 1945   HGBUR NEGATIVE 04/20/2023 1945   BILIRUBINUR NEGATIVE 04/20/2023 1945   KETONESUR 5 (A) 04/20/2023 1945   PROTEINUR 30 (A) 04/20/2023 1945   NITRITE NEGATIVE 04/20/2023 1945   LEUKOCYTESUR NEGATIVE 04/20/2023 1945   Sepsis Labs: @LABRCNTIP (procalcitonin:4,lacticidven:4) )No results found for this or any previous visit (from the past 240 hours).   Radiological Exams on Admission: DG Chest Port 1 View Result Date: 10/30/2023 CLINICAL DATA:  Shortness of breath EXAM: PORTABLE CHEST 1 VIEW COMPARISON:   10/05/2023, CT 09/11/2023 FINDINGS: Right-sided central venous port tip at the cavoatrial junction. Small right-sided pleural effusion, small moderate probably loculated left pleural effusion, increased compared to prior radiograph. Distortion and irregular density in the left upper lobe, perihilar region appears more dense radiographically compared to prior. Persistent dense left lung base consolidation. New or worsened diffuse right-sided pulmonary infiltrates or edema. No pneumothorax IMPRESSION: 1. Small right-sided pleural effusion, small to moderate probably loculated left pleural effusion, increased compared to prior radiograph. 2. New or  worsened diffuse right-sided pulmonary infiltrates or edema. 3. Distortion and irregular density in the left upper lobe, perihilar region appears more dense radiographically compared to prior and corresponds to known history of malignancy. Persistent dense left lung base consolidation. Electronically Signed   By: Esmeralda Hedge M.D.   On: 10/30/2023 20:42    EKG: Independently reviewed. Sinus tachycardia, rate 109, RBBB.   Assessment/Plan   1. Acute hypoxic respiratory failure; pleural effusions  - CT demonstrates worsening metastatic disease, increased pleural effusions, and pneumonia vs lymphangitic spread  - There is no cough or fever and WBC has decreased  - Continue supplemental O2 and supportive care, consult IR for thoracentesis   2. Hypoglycemia; type II DM  - A1c was 10.1% in December 2024; serum glucose was 49 in ED - Hold glipizide , check CBGs, treat hypoglycemia as-needed, use low-intensity SSI as needed   3. AKI  - SCr is 1.77 on admission, up from 1.36 a week ago and 0.9 last month  - Hold lisinopril , check UA and FENa, renally-dose medications, repeat chem panel in am   4. Metastatic lung cancer  - Stage IV non-small cell lung cancer with adenopathy and bone mets, currently on second-line chemotherapy under the care of Dr. Marguerita Shih    5.  Hypertension  - BP low in ED, pt has AKI, and lisinopril  will be held  6. Anemia; thrombocytopenia  - Appears stable with no overt bleeding     DVT prophylaxis: SCDs  Code Status: Full  Level of Care: Level of care: Stepdown Family Communication: None present   Disposition Plan:  Patient is from: SNF  Anticipated d/c is to: SNF  Anticipated d/c date is: 11/01/23  Patient currently: Pending CT chest, echocardiogram, improved respiratory status  Consults called: None  Admission status: inpatient     Walton Guppy, MD [facility]  10/30/2023, 11:16 PM

## 2023-10-30 NOTE — ED Provider Notes (Signed)
 [place] [facility]ERGENC[facility] DE[facility]MENT AT [facility] Provider Note  CSN: [id number] Arrival date & time: 10/30/23 1920  Chief Complaint(s) Shortness of Breath  HPI Jonathan Page is a 73 y.o. male with history of non-small cell lung cell on active chemotherapy is here today for shortness of breath.  Patient lives in a skilled nursing facility, states he is felt more short of breath with ambulation.  Received chemotherapy 2 weeks ago.   Past Medical History Past Medical History:  Diagnosis Date   Allergy    Cancer (HCC)    Cellulitis 02/21/2018   Concussion syndrome    Diabetes (HCC)    Frequent headaches    Hypertension    Patient Active Problem List   Diagnosis Date Noted   Acute respiratory failure with hypoxia (HCC) 10/30/2023   Subarachnoid hematoma (HCC) 10/04/2023   Subarachnoid hemorrhage (HCC) 10/02/2023   Iron  deficiency anemia 08/08/2023   Anemia 08/01/2023   Thrombocytopenia (HCC) 08/01/2023   On antineoplastic chemotherapy 08/01/2023   Uncontrolled type 2 diabetes mellitus with hyperglycemia, without long-term current use of insulin  (HCC) 08/01/2023   Chronic dyspnea 08/01/2023   Leg edema, right 08/01/2023   S/P total left hip intramedullary nail 07/25/23 08/01/2023   Status post hip surgery 07/25/2023   Port-A-Cath in place 07/17/2023   Encounter for antineoplastic chemotherapy 07/03/2023   Encounter for antineoplastic immunotherapy 07/03/2023   Non-small cell lung cancer metastatic to bone (HCC) 06/22/2023   Lung mass 06/02/2023   Diabetes mellitus treated with oral medication (HCC) 05/29/2023   Restless legs syndrome (RLS) 05/29/2023   Chest pain 01/20/2023   Left leg pain 10/28/2022   Skin lesion 04/16/2021   Preventative health care 11/08/2017   Syncope and collapse 10/14/2016   Prolonged QT interval 10/14/2016   Hyperlipidemia 01/25/2016   CKD (chronic kidney disease) stage 3, GFR 30-59 ml/min (HCC) 01/25/2016   Essential hypertension  10/27/2015   Type II diabetes mellitus with nephropathy (HCC) 10/22/2015   Home Medication(s) Prior to Admission medications   Medication Sig Start Date End Date Taking? Authorizing Provider  Oxycodone  HCl 10 MG TABS Take 10 mg by mouth every 4 (four) hours as needed. 10/30/23  Yes [provider]  Blood Glucose Monitoring Suppl (ACCU-CHEK AVIVA PLUS) w/Device KIT Check blood sugar before breakfast, lunch and bedtime and as directed. Dx E11.65 07/13/17   [name], NP  Blood Glucose Monitoring Suppl DEVI 1 each by Does not apply route as directed. May substitute to any manufacturer covered by patient's insurance. 08/23/23   Dorothe Gaster, NP  dexamethasone  (DECADRON ) 4 MG tablet Take 2 tabs by mouth 2 times daily starting day before chemo. Then take 2 tabs daily for 2 days starting day after chemo. Take with food. Patient not taking: Reported on 10/02/2023 09/18/23   Marlene Simas, MD  diclofenac  Sodium (VOLTAREN ) 1 % GEL Apply 2 g topically 4 (four) times daily. 09/08/23   Magnant, Justice Olp, PA-C  docusate sodium  (COLACE) 100 MG capsule Take 1 capsule (100 mg total) by mouth 2 (two) times daily. Patient not taking: Reported on 10/02/2023 07/26/23   Magnant, Justice Olp, PA-C  feeding supplement (ENSURE ENLIVE / ENSURE PLUS) LIQD Take 237 mLs by mouth 2 (two) times daily between meals. 10/06/23   Lesa Rape, MD  folic acid  (FOLVITE ) 1 MG tablet Take 1 mg by mouth in the morning. Patient not taking: Reported on 10/02/2023    [provider]  gabapentin  (NEURONTIN ) 300 MG capsule Take 1  capsule (300 mg total) by mouth at bedtime. Patient taking differently: Take 300 mg by mouth in the morning. 07/03/23   Heilingoetter, Cassandra L, PA-C  glipiZIDE  (GLUCOTROL  XL) 10 MG 24 hr tablet Take 1 tablet (10 mg total) by mouth daily with breakfast. for diabetes. 05/29/23   Helaine Llanos, MD  glucose blood (ACCU-CHEK AVIVA PLUS) test strip Check blood sugar before breakfast, lunch and  bedtime and as directed. Dx E11.65 08/30/23   Clark, Katherine K, NP  insulin  NPH-regular Human (70-30) 100 UNIT/ML injection Inject 5 Units into the skin daily with breakfast.    [provider]  Lancets (ACCU-CHEK SOFT TOUCH) lancets Check blood sugar before breakfast, lunch and bedtime and as directed. Dx E11.65 07/13/17   Clark, Katherine K, NP  lidocaine -prilocaine  (EMLA ) cream Apply 1 Application topically as needed. 06/29/23   Marlene Simas, MD  lisinopril  (ZESTRIL ) 10 MG tablet Take 1 tablet (10 mg total) by mouth daily. for blood pressure. 07/11/23   Clark, Katherine K, NP  methocarbamol  (ROBAXIN ) 500 MG tablet Take 1 tablet (500 mg total) by mouth every 8 (eight) hours as needed for muscle spasms. 10/19/23   Pickenpack-Cousar, Athena N, NP  ondansetron  (ZOFRAN ) 8 MG tablet Take 8 mg by mouth every 8 (eight) hours as needed for nausea or vomiting.    [provider]  oxyCODONE  (OXY IR/ROXICODONE ) 5 MG immediate release tablet Take 1-2 tablets (5-10 mg total) by mouth every 4 (four) hours as needed for moderate pain (pain score 4-6) or severe pain (pain score 7-10). 10/19/23   Pickenpack-Cousar, Athena N, NP  prochlorperazine  (COMPAZINE ) 10 MG tablet Take 1 tablet (10 mg total) by mouth every 6 (six) hours as needed for nausea or vomiting. 06/29/23   Marlene Simas, MD                                                                                                                                    Past Surgical History Past Surgical History:  Procedure Laterality Date   APPENDECTOMY  1960   BRONCHIAL BIOPSY  06/12/2023   Procedure: BRONCHIAL BIOPSIES;  Surgeon: Prudy Brownie, DO;  Location: [facility];  Service: Cardiopulmonary;;   BRONCHIAL BRUSHINGS  06/12/2023   Procedure: BRONCHIAL BRUSHINGS;  Surgeon: Prudy Brownie, DO;  Location: [facility];  Service: Cardiopulmonary;;   COLONOSCOPY     FEMUR IM NAIL Left 07/25/2023   Procedure: LEFT HIP INTRAMEDULLARY HIP  SCREW;  Surgeon: Jasmine Mesi, MD;  Location: [facility] OR;  Service: Orthopedics;  Laterality: Left;   HIP SURGERY Left 07/25/2023   metal pin placed in L hip area   IR IMAGING GUIDED PORT INSERTION  07/13/2023   VIDEO BRONCHOSCOPY Bilateral 06/12/2023   Procedure: VIDEO BRONCHOSCOPY;  Surgeon: Prudy Brownie, DO;  Location: [facility];  Service: Cardiopulmonary;  Laterality: Bilateral;   WISDOM TOOTH EXTRACTION     Family History Family History  Problem  Relation Age of Onset   Diabetes Father    Diabetes Paternal Grandfather    Diabetes Paternal Grandmother    Colon cancer Neg Hx    Crohn's disease Neg Hx    Rectal cancer Neg Hx    Stomach cancer Neg Hx    Esophageal cancer Neg Hx     Social History Social History   Tobacco Use   Smoking status: Former    Current packs/day: 0.00    Average packs/day: 0.5 packs/day for 30.0 years (15.0 ttl pk-yrs)    Types: Cigarettes, Cigars    Start date: 10/1988    Quit date: 10/2018    Years since quitting: 5.0   Smokeless tobacco: Never  Vaping Use   Vaping status: Never Used  Substance Use Topics   Alcohol use: Yes    Alcohol/week: 6.0 standard drinks of alcohol    Types: 6 Cans of beer per week    Comment: occasional   Drug use: No   Allergies Patient has no allergy information on record.  Review of Systems Review of Systems  Physical Exam Vital Signs  I have reviewed the triage vital signs BP 91/70   Pulse (!) 108   Temp 97.7 F (36.5 C) (Oral)   Resp (!) 27   SpO2 94%   Physical Exam Vitals reviewed.  Constitutional:      Appearance: He is ill-appearing.  Pulmonary:     Breath sounds: Examination of the right-lower field reveals decreased breath sounds. Examination of the left-lower field reveals decreased breath sounds. Decreased breath sounds present.  Chest:     Chest wall: No mass.  Abdominal:     Palpations: Abdomen is soft.  Neurological:     General: No focal deficit present.     ED Results  and Treatments Labs (all labs ordered are listed, but only abnormal results are displayed) Labs Reviewed  BRAIN NATRIURETIC PEPTIDE - Abnormal; Notable for the following components:      Result Value   B Natriuretic Peptide 269.3 (*)    All other components within normal limits  COMPREHENSIVE METABOLIC PANEL WITH GFR - Abnormal; Notable for the following components:   CO2 19 (*)    Glucose, Bld 49 (*)    BUN 57 (*)    Creatinine, Ser 1.77 (*)    Total Protein 6.0 (*)    Albumin 2.8 (*)    AST 14 (*)    Alkaline Phosphatase 164 (*)    GFR, Estimated 40 (*)    All other components within normal limits  CBC WITH DIFFERENTIAL/PLATELET - Abnormal; Notable for the following components:   WBC 14.3 (*)    RBC 2.35 (*)    Hemoglobin 8.0 (*)    HCT 26.5 (*)    MCV 112.8 (*)    RDW 22.0 (*)    Platelets 128 (*)    Neutro Abs 12.1 (*)    Abs Immature Granulocytes 0.47 (*)    All other components within normal limits  CULTURE, BLOOD (ROUTINE X 2)  CULTURE, BLOOD (ROUTINE X 2)  Radiology DG Chest Port 1 View Result Date: 10/30/2023 CLINICAL DATA:  Shortness of breath EXAM: PORTABLE CHEST 1 VIEW COMPARISON:  10/05/2023, CT 09/11/2023 FINDINGS: Right-sided central venous port tip at the cavoatrial junction. Small right-sided pleural effusion, small moderate probably loculated left pleural effusion, increased compared to prior radiograph. Distortion and irregular density in the left upper lobe, perihilar region appears more dense radiographically compared to prior. Persistent dense left lung base consolidation. New or worsened diffuse right-sided pulmonary infiltrates or edema. No pneumothorax IMPRESSION: 1. Small right-sided pleural effusion, small to moderate probably loculated left pleural effusion, increased compared to prior radiograph. 2. New or worsened diffuse right-sided  pulmonary infiltrates or edema. 3. Distortion and irregular density in the left upper lobe, perihilar region appears more dense radiographically compared to prior and corresponds to known history of malignancy. Persistent dense left lung base consolidation. Electronically Signed   By: Esmeralda Hedge M.D.   On: 10/30/2023 20:42    Pertinent labs & imaging results that were available during my care of the patient were reviewed by me and considered in my medical decision making (see MDM for details).  Medications Ordered in ED Medications  furosemide  (LASIX ) injection 20 mg (20 mg Intravenous Given 10/30/23 2015)                                                                                                                                     Procedures .Critical Care  Performed by: Nathanael Baker, DO Authorized by: Nathanael Baker, DO   Critical care provider statement:    Critical care time (minutes):  33   Critical care was necessary to treat or prevent imminent or life-threatening deterioration of the following conditions:  Respiratory failure   Critical care was time spent personally by me on the following activities:  Blood draw for specimens, development of treatment plan with patient or surrogate, discussions with consultants, evaluation of patient's response to treatment, examination of patient, interpretation of cardiac output measurements, ordering and performing treatments and interventions, ordering and review of laboratory studies, pulse oximetry, re-evaluation of patient's condition and review of old charts Ultrasound ED Thoracic  Date/Time: 10/30/2023 10:55 PM  Performed by: Nathanael Baker, DO Authorized by: Nathanael Baker, DO   Procedure details:    Indications: dyspnea     Left lung pleural:  Visualized   Right lung pleural:  Visualized   Images: archived   Findings:    B-lines noted throughout: identified   Right Lung Findings:     right lung sliding    no  right lung consolidation    right lung pleural effusion      Left Lung Findings:     left lung sliding    no left lung consolidation    left lung pleural effusion   (including critical care time)  Medical Decision Making / ED Course   This patient presents to the ED  for concern of dyspnea on exertion, this involves an extensive number of treatment options, and is a complaint that carries with it a high risk of complications and morbidity.  The differential diagnosis includes pleural effusion, less likely PE, less likely pneumonia, less likely pericardial effusion.  MDM: On bedside ultrasound, patient with pulmonary edema, pleural effusions, left larger than right.  Patient with soft blood pressures, however going back through his previous notes patient has had blood pressures within this range.  Will gently diurese the patient with 20 of Lasix  and reassess.  Patient is on 2 L nasal cannula and quite comfortable.  Patient's chest x-ray confirms pleural effusions.  Question of infiltrates.  Out of abundance of caution, with the patient being anticoagulated, did obtain blood cultures and gave antibiotics. I do not believe this patient has sepsis at this time.  Additional history obtained: -Additional history obtained from EMS this -External records from outside source obtained and reviewed including: Chart review including previous notes, labs, imaging, consultation notes   Lab Tests: -I ordered, reviewed, and interpreted labs.   The pertinent results include:   Labs Reviewed  BRAIN NATRIURETIC PEPTIDE - Abnormal; Notable for the following components:      Result Value   B Natriuretic Peptide 269.3 (*)    All other components within normal limits  COMPREHENSIVE METABOLIC PANEL WITH GFR - Abnormal; Notable for the following components:   CO2 19 (*)    Glucose, Bld 49 (*)    BUN 57 (*)    Creatinine, Ser 1.77 (*)    Total Protein 6.0 (*)    Albumin 2.8 (*)    AST 14 (*)     Alkaline Phosphatase 164 (*)    GFR, Estimated 40 (*)    All other components within normal limits  CBC WITH DIFFERENTIAL/PLATELET - Abnormal; Notable for the following components:   WBC 14.3 (*)    RBC 2.35 (*)    Hemoglobin 8.0 (*)    HCT 26.5 (*)    MCV 112.8 (*)    RDW 22.0 (*)    Platelets 128 (*)    Neutro Abs 12.1 (*)    Abs Immature Granulocytes 0.47 (*)    All other components within normal limits  CULTURE, BLOOD (ROUTINE X 2)  CULTURE, BLOOD (ROUTINE X 2)      EKG sinus tachycardia, no acute ischemia  EKG Interpretation Date/Time:  Monday Oct 30 2023 19:38:51 EDT Ventricular Rate:  109 PR Interval:  188 QRS Duration:  128 QT Interval:  354 QTC Calculation: 477 R Axis:   -86  Text Interpretation: Sinus tachycardia Right bundle branch block Inferior infarct, old Lateral leads are also involved Confirmed by Afton Horse [phone]) on 10/30/2023 10:53:51 PM         Imaging Studies ordered: I ordered imaging studies including chest x-ray I independently visualized and interpreted imaging. I agree with the radiologist interpretation   Medicines ordered and prescription drug management: Meds ordered this encounter  Medications   furosemide  (LASIX ) injection 20 mg    -I have reviewed the patients home medicines and have made adjustments as needed  Critical interventions Management of hypoxia, large pleural effusions.   Cardiac Monitoring: The patient was maintained on a cardiac monitor.  I personally viewed and interpreted the cardiac monitored which showed an underlying rhythm of: Sinus tachycardia  Social Determinants of Health:  Factors impacting patients care include: Multiple medical comorbidities including non-small cell lung cancer   Reevaluation: After the interventions noted above,  I reevaluated the patient and found that they have :improved  Co morbidities that complicate the patient evaluation  Past Medical History:  Diagnosis Date    Allergy    Cancer (HCC)    Cellulitis 02/21/2018   Concussion syndrome    Diabetes (HCC)    Frequent headaches    Hypertension           Final Clinical Impression(s) / ED Diagnoses Final diagnoses:  Chronic bilateral pleural effusions  Acute hypoxic respiratory failure (HCC)     @PCDICTATION @    Afton Horse T, DO 10/30/23 2257

## 2023-10-30 NOTE — Telephone Encounter (Signed)
 Patient was identified as falling into the [place] Measure - Diabetes.   Patient was: Requires a call back at a later time.  Patient had follow up scheduled that he did not make on 09/20/23. Do you want us  to call and r/s

## 2023-10-31 ENCOUNTER — Inpatient Hospital Stay (HOSPITAL_COMMUNITY)

## 2023-10-31 ENCOUNTER — Other Ambulatory Visit: Payer: Self-pay

## 2023-10-31 DIAGNOSIS — Z7189 Other specified counseling: Secondary | ICD-10-CM | POA: Diagnosis not present

## 2023-10-31 DIAGNOSIS — Z66 Do not resuscitate: Secondary | ICD-10-CM | POA: Diagnosis not present

## 2023-10-31 DIAGNOSIS — R5383 Other fatigue: Secondary | ICD-10-CM

## 2023-10-31 DIAGNOSIS — D696 Thrombocytopenia, unspecified: Secondary | ICD-10-CM | POA: Diagnosis not present

## 2023-10-31 DIAGNOSIS — D649 Anemia, unspecified: Secondary | ICD-10-CM | POA: Diagnosis not present

## 2023-10-31 DIAGNOSIS — R531 Weakness: Secondary | ICD-10-CM

## 2023-10-31 DIAGNOSIS — C3412 Malignant neoplasm of upper lobe, left bronchus or lung: Secondary | ICD-10-CM

## 2023-10-31 DIAGNOSIS — J9601 Acute respiratory failure with hypoxia: Secondary | ICD-10-CM | POA: Diagnosis not present

## 2023-10-31 DIAGNOSIS — C7951 Secondary malignant neoplasm of bone: Secondary | ICD-10-CM | POA: Diagnosis not present

## 2023-10-31 DIAGNOSIS — Z515 Encounter for palliative care: Secondary | ICD-10-CM | POA: Diagnosis not present

## 2023-10-31 DIAGNOSIS — C3490 Malignant neoplasm of unspecified part of unspecified bronchus or lung: Secondary | ICD-10-CM | POA: Diagnosis not present

## 2023-10-31 LAB — CBC
HCT: 24.2 % — ABNORMAL LOW (ref 39.0–52.0)
Hemoglobin: 7.1 g/dL — ABNORMAL LOW (ref 13.0–17.0)
MCH: 33.5 pg (ref 26.0–34.0)
MCHC: 29.3 g/dL — ABNORMAL LOW (ref 30.0–36.0)
MCV: 114.2 fL — ABNORMAL HIGH (ref 80.0–100.0)
Platelets: 112 10*3/uL — ABNORMAL LOW (ref 150–400)
RBC: 2.12 MIL/uL — ABNORMAL LOW (ref 4.22–5.81)
RDW: 21.6 % — ABNORMAL HIGH (ref 11.5–15.5)
WBC: 11.7 10*3/uL — ABNORMAL HIGH (ref 4.0–10.5)
nRBC: 0 % (ref 0.0–0.2)

## 2023-10-31 LAB — BASIC METABOLIC PANEL WITH GFR
Anion gap: 10 (ref 5–15)
BUN: 53 mg/dL — ABNORMAL HIGH (ref 8–23)
CO2: 18 mmol/L — ABNORMAL LOW (ref 22–32)
Calcium: 8.8 mg/dL — ABNORMAL LOW (ref 8.9–10.3)
Chloride: 108 mmol/L (ref 98–111)
Creatinine, Ser: 1.66 mg/dL — ABNORMAL HIGH (ref 0.61–1.24)
GFR, Estimated: 44 mL/min — ABNORMAL LOW (ref >60)
Glucose, Bld: 118 mg/dL — ABNORMAL HIGH (ref 70–99)
Potassium: 4.1 mmol/L (ref 3.5–5.1)
Sodium: 136 mmol/L (ref 135–145)

## 2023-10-31 LAB — URINALYSIS, COMPLETE (UACMP) WITH MICROSCOPIC
Bacteria, UA: NONE SEEN
Bilirubin Urine: NEGATIVE
Glucose, UA: NEGATIVE mg/dL
Ketones, ur: NEGATIVE mg/dL
Leukocytes,Ua: NEGATIVE
Nitrite: NEGATIVE
Protein, ur: NEGATIVE mg/dL
Specific Gravity, Urine: 1.013 (ref 1.005–1.030)
pH: 5 (ref 5.0–8.0)

## 2023-10-31 LAB — LACTATE DEHYDROGENASE, PLEURAL OR PERITONEAL FLUID: LD, Fluid: 240 U/L — ABNORMAL HIGH (ref 3–23)

## 2023-10-31 LAB — HEMOGLOBIN A1C
Hgb A1c MFr Bld: 5.1 % (ref 4.8–5.6)
Mean Plasma Glucose: 99.67 mg/dL

## 2023-10-31 LAB — GLUCOSE, PLEURAL OR PERITONEAL FLUID: Glucose, Fluid: 131 mg/dL

## 2023-10-31 LAB — HEPATIC FUNCTION PANEL
ALT: 9 U/L (ref 0–44)
AST: 12 U/L — ABNORMAL LOW (ref 15–41)
Albumin: 2.6 g/dL — ABNORMAL LOW (ref 3.5–5.0)
Alkaline Phosphatase: 151 U/L — ABNORMAL HIGH (ref 38–126)
Bilirubin, Direct: 0.2 mg/dL (ref 0.0–0.2)
Indirect Bilirubin: 0.8 mg/dL (ref 0.3–0.9)
Total Bilirubin: 1 mg/dL (ref 0.0–1.2)
Total Protein: 5.7 g/dL — ABNORMAL LOW (ref 6.5–8.1)

## 2023-10-31 LAB — BODY FLUID CELL COUNT WITH DIFFERENTIAL
Eos, Fluid: 0 %
Lymphs, Fluid: 3 %
Monocyte-Macrophage-Serous Fluid: 2 % — ABNORMAL LOW (ref 50–90)
Neutrophil Count, Fluid: 95 % — ABNORMAL HIGH (ref 0–25)
Other Cells, Fluid: 60 %
Total Nucleated Cell Count, Fluid: 526 uL (ref 0–1000)

## 2023-10-31 LAB — CBG MONITORING, ED: Glucose-Capillary: 99 mg/dL (ref 70–99)

## 2023-10-31 LAB — GLUCOSE, CAPILLARY
Glucose-Capillary: 133 mg/dL — ABNORMAL HIGH (ref 70–99)
Glucose-Capillary: 161 mg/dL — ABNORMAL HIGH (ref 70–99)
Glucose-Capillary: 239 mg/dL — ABNORMAL HIGH (ref 70–99)
Glucose-Capillary: 222 mg/dL — ABNORMAL HIGH (ref 70–99)

## 2023-10-31 LAB — PROTEIN, PLEURAL OR PERITONEAL FLUID: Total protein, fluid: <3 g/dL

## 2023-10-31 LAB — CREATININE, URINE, RANDOM: Creatinine, Urine: 63 mg/dL

## 2023-10-31 LAB — LACTATE DEHYDROGENASE: LDH: 323 U/L — ABNORMAL HIGH (ref 98–192)

## 2023-10-31 LAB — MAGNESIUM: Magnesium: 2.2 mg/dL (ref 1.7–2.4)

## 2023-10-31 LAB — SODIUM, URINE, RANDOM: Sodium, Ur: 70 mmol/L

## 2023-10-31 LAB — PREPARE RBC (CROSSMATCH)

## 2023-10-31 MED ORDER — SODIUM CHLORIDE 0.9% IV SOLUTION: Freq: Once | INTRAVENOUS | Status: AC

## 2023-10-31 MED ORDER — CHLORHEXIDINE GLUCONATE CLOTH 2 % EX PADS
6.0000 | MEDICATED_PAD | Freq: Every day | CUTANEOUS | Status: DC
  Administered 2023-10-31 – 2023-11-01 (×2): 6 via TOPICAL

## 2023-10-31 MED ORDER — ENSURE ENLIVE PO LIQD
237.0000 mL | Freq: Two times a day (BID) | ORAL | Status: DC
  Administered 2023-10-31 – 2023-11-01 (×3): 237 mL via ORAL

## 2023-10-31 MED ORDER — FENTANYL CITRATE PF 50 MCG/ML IJ SOSY
25.0000 ug | PREFILLED_SYRINGE | INTRAMUSCULAR | Status: DC | PRN
  Administered 2023-10-31 – 2023-11-01 (×5): 25 ug via INTRAVENOUS
  Filled 2023-10-31 (×5): qty 1

## 2023-10-31 MED ORDER — ORAL CARE MOUTH RINSE: 15.0000 mL | OROMUCOSAL | Status: DC | PRN

## 2023-10-31 MED ORDER — LIDOCAINE HCL 1 % IJ SOLN
INTRAMUSCULAR | Status: AC
  Filled 2023-10-31: qty 20

## 2023-10-31 MED ORDER — ALBUMIN HUMAN 25 % IV SOLN
12.5000 g | Freq: Once | INTRAVENOUS | Status: AC
  Administered 2023-10-31: 12.5 g via INTRAVENOUS
  Filled 2023-10-31: qty 50

## 2023-10-31 MED ORDER — SODIUM CHLORIDE 0.9 % IV SOLN
2.0000 g | Freq: Two times a day (BID) | INTRAVENOUS | Status: DC
  Administered 2023-10-31 – 2023-11-01 (×4): 2 g via INTRAVENOUS
  Filled 2023-10-31 (×4): qty 12.5

## 2023-10-31 NOTE — ED Notes (Signed)
 Pt's BP running soft mid 80's/50's. Jonathan Page at bedside and aware. Per provider reviewing Pt's history, Pt known to run soft. Continue to monitor Pt and as long as MAP stays above 60 provider is ok with not administering fluids at this time. Pt remains on cardiac monitor and cycling q49mins.

## 2023-10-31 NOTE — Assessment & Plan Note (Signed)
-  Consult Oncology

## 2023-10-31 NOTE — TOC Initial Note (Signed)
 Transition of Care [facility]) - Initial/Assessment Note    Patient Details  Name: Jonathan Page MRN: [id number] Date of Birth: [birth year]  Transition of Care [facility]) CM/SW Contact:    Jonni Nettle, LCSW Phone Number: 10/31/2023, 10:33 AM  Clinical Narrative:                 CSW spoke with Kia, admissions staff, at [facility], regarding pt's status. Per Kia, pt has been in short-term Skilled Nusing rehab at [facility]. Per Kia, pt is able to return to facility upon discharge, if recommended for SNF by PT. CSW notified MD. Per MD, pt may discharge home with hospice care. TOC will continue to follow.  Expected Discharge Plan: Home w Hospice Care Barriers to Discharge: Continued Medical Work up   Patient Goals and CMS Choice Patient states their goals for this hospitalization and ongoing recovery are:: To return home CMS Medicare.gov Compare Post Acute Care list provided to:: Patient Choice offered to / list presented to : Patient [place] ownership interest in [facility].provided to:: Patient    Expected Discharge Plan and Services In-house Referral: Clinical Social Work   Post Acute Care Choice: NA Living arrangements for the past 2 months: Skilled Nursing Facility Sports administrator)                 DME Arranged: N/A DME Agency: NA       HH Arranged: NA    Prior Living Arrangements/Services Living arrangements for the past 2 months: Skilled Designer, fashion/clothing) Lives with:: Self, Facility Resident Patient language and need for interpreter reviewed:: Yes Do you feel safe going back to the place where you live?: Yes      Need for Family Participation in Patient Care: No (Comment) Care giver support system in place?: No (comment)   Criminal Activity/Legal Involvement Pertinent to Current Situation/Hospitalization: No - Comment as needed  Activities of Daily Living   ADL Screening (condition at time of  admission) Independently performs ADLs?: No Does the patient have a NEW difficulty with bathing/dressing/toileting/self-feeding that is expected to last >3 days?: No Does the patient have a NEW difficulty with getting in/out of bed, walking, or climbing stairs that is expected to last >3 days?: Yes (Initiates electronic notice to provider for possible PT consult) Does the patient have a NEW difficulty with communication that is expected to last >3 days?: No Is the patient deaf or have difficulty hearing?: No Does the patient have difficulty seeing, even when wearing glasses/contacts?: No Does the patient have difficulty concentrating, remembering, or making decisions?: No  Permission Sought/Granted Permission sought to share information with : Facility Industrial/product designer granted to share information with : Yes, Verbal Permission Granted     Permission granted to share info w AGENCY: [facility]        Emotional Assessment Appearance:: Appears stated age Attitude/Demeanor/Rapport: Unable to Assess Affect (typically observed): Unable to Assess Orientation: : Oriented to Self, Oriented to Place, Oriented to  Time, Oriented to Situation Alcohol / Substance Use: Not Applicable Psych Involvement: No (comment)  Admission diagnosis:  Acute respiratory failure with hypoxia (HCC) [J96.01] Chronic bilateral pleural effusions [J90] Acute hypoxic respiratory failure (HCC) [J96.01] Patient Active Problem List   Diagnosis Date Noted   Acute respiratory failure with hypoxia (HCC) 10/30/2023   AKI (acute kidney injury) (HCC) 10/30/2023   Pleural effusion, bilateral 10/30/2023   Acute CHF (HCC) 10/30/2023   Hypoglycemia 10/30/2023   Subarachnoid hematoma (HCC)  10/04/2023   Subarachnoid hemorrhage (HCC) 10/02/2023   Iron  deficiency anemia 08/08/2023   Anemia 08/01/2023   Thrombocytopenia (HCC) 08/01/2023   On antineoplastic chemotherapy 08/01/2023   Uncontrolled type 2  diabetes mellitus with hyperglycemia, without long-term current use of insulin  (HCC) 08/01/2023   Chronic dyspnea 08/01/2023   Leg edema, right 08/01/2023   S/P total left hip intramedullary nail 07/25/23 08/01/2023   Status post hip surgery 07/25/2023   Port-A-Cath in place 07/17/2023   Encounter for antineoplastic chemotherapy 07/03/2023   Encounter for antineoplastic immunotherapy 07/03/2023   Non-small cell lung cancer metastatic to bone (HCC) 06/22/2023   Lung mass 06/02/2023   Diabetes mellitus treated with oral medication (HCC) 05/29/2023   Restless legs syndrome (RLS) 05/29/2023   Chest pain 01/20/2023   Left leg pain 10/28/2022   Skin lesion 04/16/2021   Preventative health care 11/08/2017   Syncope and collapse 10/14/2016   Prolonged QT interval 10/14/2016   Hyperlipidemia 01/25/2016   CKD (chronic kidney disease) stage 3, GFR 30-59 ml/min (HCC) 01/25/2016   Essential hypertension 10/27/2015   Type II diabetes mellitus with nephropathy (HCC) 10/22/2015   PCP:  [name], NP Pharmacy:   [facility] [address] [place] - [address] [place] [address] Phone: [phone] Fax: [phone]  [facility] [phone] - [place], [place] - [address] [place] [address] Phone: [phone] Fax: [phone]   Social Drivers of Health (SDOH) Social History: SDOH Screenings   Food Insecurity: No Food Insecurity (10/31/2023)  Housing: Low Risk  (10/31/2023)  Transportation Needs: No Transportation Needs (10/31/2023)  Utilities: Not At Risk (10/31/2023)  Depression (PHQ2-9): Low Risk  (03/16/2023)  Social Connections: Unknown (10/31/2023)  Tobacco Use: Medium Risk (10/30/2023)   SDOH Interventions: N/A     Readmission Risk Interventions    10/31/2023   10:28 AM  Readmission Risk Prevention Plan  Transportation Screening Complete  Medication Review (RN Care Manager) Complete  PCP or Specialist appointment within  3-5 days of discharge Complete  HRI or Home Care Consult Complete  SW Recovery Care/Counseling Consult Complete  Palliative Care Screening Not Complete  Comments TBD  Skilled Nursing Facility Not Complete  SNF Comments TBD

## 2023-10-31 NOTE — Assessment & Plan Note (Signed)
 Pleural effusions, L>R Hypoxia due to effusions, increased airspace disease.  Latter is likely spread of tumor, but cannot rule out infection.  Will attempt to mitigate effusions with thora.  If no improvement with antibiotics, prognosis likely dire. - Thoracentesis - Follow cultures and cytology - Continue cefepime - Consult Oncology - Consult Palliative Care - Chest PT

## 2023-10-31 NOTE — Assessment & Plan Note (Addendum)
 Cr 1.7 on admission from baseline 1.3.  Improved with fluids overnight. - Transfuse and trend Hgb - Albumin - Hold lisinopril 

## 2023-10-31 NOTE — Hospital Course (Addendum)
 73 year old man PMH non-small cell lung cancer metastatic to bone, progressive despite second line therapy, presented with shortness of breath.  CT showed progression of disease, patient admitted for new hypoxia with respiratory failure, further evaluation.  Treated with antibiotics, underwent thoracentesis with small postprocedure pneumothorax which is stable.  Pulmonology recommended conservative management.  Seen by oncology with recommendation for no further chemotherapy.  Seen by palliative care for ultimately patient elected comfort care.  Consultants Oncology  Palliative medicine  Procedures/Events 5/20 left thoracentesis

## 2023-10-31 NOTE — CV Procedure (Addendum)
 PROCEDURE SUMMARY:  Successful image-guided left thoracentesis. Yielded 1.10 liters of clear, straw-colored pleural fluid. Patient tolerated procedure well. EBL: Zero No immediate complications.  Specimen was sent for labs. Post procedure CXR is pending.  Please see imaging section of Epic for full dictation.  Trew Sunde A Linn Goetze PA-C 10/31/2023 1:32 PM

## 2023-10-31 NOTE — Assessment & Plan Note (Signed)
 Platelets unchanged 112K

## 2023-10-31 NOTE — Assessment & Plan Note (Signed)
 Hgb down to 7.1 g/dL today.  Given hypoxia, symptomatic anemia, will transfuse. - Transfuse 1u PRBCs - Trend Hgb - Continue folate

## 2023-10-31 NOTE — Consult Note (Signed)
 Palliative Care Consult Note                                  Date: 10/31/2023   Patient Name: Jonathan Page  DOB: [birth year]  MRN: [id number]  Age / Sex: 73 y.o., male  PCP: [name], NP Referring Physician: Ephriam Hashimoto, *  Reason for Consultation: Establishing goals of care, Terminal Care, and Withdrawal of life-sustaining treatment  HPI/Patient Profile: Palliative Care consult requested for goals of care discussion in this 73 y.o. male  with past medical history of non-small cell lung cancer (05/2024) with metastatic disease to spine and widespread bone on second line treatment of ramucirumab  and doxetal due to progress, recent subarachnoid hemorrhage s/p fall, diabetes, hypertension, and seasonal allergies. He was admitted on 10/30/2023 with weakness, increased shortness of breath. CT showed bilateral pleural effusion, worsening mets, and enlarged left upper lobe mass. Palliative consulted for goals of care discussions.    Past Medical History:  Diagnosis Date   Allergy    Cancer (HCC)    Cellulitis 02/21/2018   Concussion syndrome    Diabetes (HCC)    Frequent headaches    Hypertension      Subjective:   This NP Gardenia Jump reviewed medical records, received report from team, assessed the patient and then met at the patient's bedside. Patient resting comfortably. Easily awakened with verbal stimuli. Alert and able to engage appropriately in discussions. He request for his long-term girlfriend to be included in discussions regarding diagnosis, prognosis, GOC, EOL wishes disposition and options. I was able to reach [name] and included her discussions via speakerphone.    Concept of Palliative Care was introduced as specialized medical care for people and their families living with serious illness.  It focuses on providing relief from the symptoms and stress of a serious illness.  The goal is to improve quality of  life for both the patient and the family. Values and goals of care important to patient and family were attempted to be elicited. Of note patient is familiar to myself as I have been actively supporting at [facility].   I created space and opportunity for [name]  and family to explore state of health prior to admission, thoughts, and feelings. Patient states he is aware that his health is not "doing well!" Ms. Kelson speaks to Thao's ongoing health decline. He has expressed regardless of outcome he is not interested in returning to SNF if possible.   We discussed His current illness and what it means in the larger context of His on-going co-morbidities. Natural disease trajectory and expectations were discussed.  Erving and family verbalized understanding of current illness and co-morbidities. At this time patient is aware per Dr. Marguerita Shih given ongoing progression despite treatment and poor overall performance status he is not a candidate for further chemotherapy. Mr. Mottola verbalized his understanding expressing his observation of decline and concern that he was reaching this point in his health journey. [name] shares concerns that patient is sleeping more than awake. He is now requiring significant assistance with ADLs. 1-2 person assist.   Ty has 2 sons who live out of town [place] [place] and [place]). They have both been contacted and advised to come visit their father due to declining health. [name] reports [name] is scheduled to fly in tomorrow (Wednesday).   I empathetically approached discussions regarding healthcare limitations, code status, and other complex  decisions. Education provided on DNR/DNI with recommendations to consider with understanding of poor overall prognosis and no improvement in quality of life due to extensiveness of his cancer. Mr. Hoogendoorn verbalized understanding. He had no desire to suffer expressing "at this point it is what it is!" Emotional support provided. He  verbalized wishes to proceed with DNR/DNI while continuing with current level of care allowing family to get into town. [name] reports she will be available after 2:30 tomorrow to engage in ongoing discussions and decision making.   I discussed the importance of continued conversation with family and their medical providers regarding overall plan of care and treatment options, ensuring decisions are within the context of the patients values and GOCs.  Questions and concerns were addressed.  The patient and family was encouraged to call with questions or concerns.  PMT will continue to support holistically as needed.  Objective:   Primary Diagnoses: Present on Admission:  Acute respiratory failure with hypoxia (HCC)  AKI (acute kidney injury) (HCC)  Uncontrolled type 2 diabetes mellitus with hyperglycemia, without long-term current use of insulin  (HCC)  Non-small cell lung cancer metastatic to bone (HCC)  Pleural effusion, bilateral  Iron  deficiency anemia  Essential hypertension  Thrombocytopenia (HCC)  Hypoglycemia   Scheduled Meds:  Chlorhexidine  Gluconate Cloth  6 each Topical Daily   feeding supplement  237 mL Oral BID BM   gabapentin   300 mg Oral QHS   insulin  aspart  0-5 Units Subcutaneous QHS   insulin  aspart  0-6 Units Subcutaneous TID WC   sodium chloride  flush  3 mL Intravenous Q12H    Continuous Infusions:  ceFEPime (MAXIPIME) IV 2 g (10/31/23 1442)    PRN Meds: acetaminophen  **OR** acetaminophen , fentaNYL  (SUBLIMAZE ) injection, mouth rinse, oxyCODONE , prochlorperazine , senna  Not on File  Review of Systems  Constitutional:  Positive for activity change, appetite change, fatigue and unexpected weight change.  Musculoskeletal:  Positive for back pain.  Neurological:  Positive for weakness.  Unless otherwise noted, a complete review of systems is negative.  Physical Exam General: NAD, frail chronically-ill appearing Cardiovascular: regular rate and  rhythm Pulmonary: diminished bilaterally  Abdomen: soft, nontender, + bowel sounds Extremities: lower extremity edema, no joint deformities Skin: no rashes, warm and dry Neurological: AAO x3  Vital Signs:  BP 94/62   Pulse (!) 108   Temp 97.8 F (36.6 C) (Oral)   Resp 15   Ht 6\' 1"  (1.854 m)   SpO2 100%   BMI 18.47 kg/m  Pain Scale: 0-10   Pain Score: 7   SpO2: SpO2: 100 % O2 Device:SpO2: 100 % O2 Flow Rate: .O2 Flow Rate (L/min): 3 L/min  IO: Intake/output summary:  Intake/Output Summary (Last 24 hours) at 10/31/2023 1728 Last data filed at 10/31/2023 1623 Gross per 24 hour  Intake 382 ml  Output --  Net 382 ml    LBM: Last BM Date :  (PTA per patient) Baseline Weight:   Most recent weight:        Palliative Assessment/Data: PPS 20-30%   Advanced Care Planning:   Primary Decision Maker: PATIENT is able to make decisions for himself. Reports if he is not able to make decisions [name] Kelson would be main decision maker with support of his children.   Code Status/Advance Care Planning: DNR  A discussion was had today regarding advanced directives. Concepts specific to code status, artifical feeding and hydration, continued IV antibiotics and rehospitalization was had.  The difference between a aggressive medical intervention path and  a palliative comfort care path was discussed.   Hospice and Palliative Care services outpatient were explained and offered. Patient and family verbalized their understanding and awareness of both palliative and hospice's goals and philosophy of care. Patient is actively followed at the cancer center for palliative. Education provided on what outpatient hospice support would look like in the home versus inpatient facility when appropriate.   Mr. Molina and [name] verbalized understanding. He confirms wishes for DNR/DNI. States he does not have an advanced directive however [name] would be his medical decision maker with support from his son.  [name] acknowledges this however expresses desires for son's to be included and a part of decision making given she and patient have been together for past 12 years.   Sircharles is clear in his expressed wishes to continue to treat the treatable allowing his sons to arrive. He is at peace with understanding of no further oncological interventions/therapies. Open to outpatient hospice support and focusing on his comfort once all family is present and in agreement. [name] verbalized her understanding while also expressing concerns for hospice support in the home given patient's recent and rapid decline. She is limited in her ability to care for him and worried about prognosis timeline of weeks etc. She wishes to discuss further once sons arrive however if patient meets criteria she would be more open to inpatient hospice facility for Mr. Leslie Rasp to receive the best symptom management during his last days.    Assessment & Plan:   SUMMARY OF RECOMMENDATIONS   DNR/DNI-as confirmed by patient and significant other.  Continue with current plan of care until sons arrive tomorrow.  Ongoing goals of care discussions. Patient realistic in his understanding and open to comfort and hospice if all family agrees. [name] express desires for inpatient hospice support versus in the home pending met criteria.  PMT will continue to support and follow as needed. Please call team line with urgent needs.  Symptom Management:  Neoplasm related pain Gabapentin  300mg  at bedtime Fentanyl  25mcg every 2 hours as needed for pain Oxycodone  10mg  every 4 hours as needed for pain Nausea Compazine  10 mg every 6 hours as needed Zofran  8mg  every 8 hours as needed Constipation Senna S 1 tablet daily as needed.   Palliative Prophylaxis:  Bowel Regimen, Delirium Protocol, Frequent Pain Assessment, Oral Care, and Turn Reposition  Additional Recommendations (Limitations, Scope, Preferences): No Artificial Feeding, No Chemotherapy, and  DNR/DNI, continue to treat the treatable pending ongoing discussions with family   Psycho-social/Spiritual:  Desire for further Chaplaincy support: no Additional Recommendations: Education on Hospice  Prognosis:  Weeks  Discharge Planning:  To Be Determined   Discussed with: Dr. Darlyn Eke via secure chat.   Patient and Significant Other  expressed understanding and was in agreement with this plan.    Time Total: 75 min   Visit consisted of counseling and education dealing with the complex and emotionally intense issues of symptom management and palliative care in the setting of serious and potentially life-threatening illness.  Signed by:  Dellia Ferguson, AGPCNP-BC [facility]   Phone: [phone] Pager: [phone] Amion: Cori Dials   Thank you for allowing the Palliative Medicine Team to assist in the care of this patient. Please utilize secure chat with additional questions, if there is no response within 30 minutes please call the above phone number. Palliative Medicine Team providers are available by phone from 7am to 5pm daily and can be reached through the team cell phone.  Should  this patient require assistance outside of these hours, please call the patient's attending physician.

## 2023-10-31 NOTE — Assessment & Plan Note (Signed)
 Neuropathy Glucose low normal - Continue SS corrections - Hold glipizide , lispro, 70/30 - Continue gabapentin 

## 2023-10-31 NOTE — Progress Notes (Addendum)
 10/31/2023 Post thora pleurisy: CXR incomplete expansion, midline structures stable. Nonlabored breathing pattern from doorway and resting. Repeat CXR in a few hours if stable just monitor.  Ardelle Kos MD PCCM

## 2023-10-31 NOTE — Progress Notes (Signed)
 Jonathan Page   DOB:[birth year]   XL#:[id number]      ASSESSMENT & PLAN:  Call your Cherylene Corrente is a 73 year old male patient with non-small cell carcinoma of lung, adenocarcinoma.  He follows with Dr. Marguerita Shih.  Patient has been admitted with acute respiratory failure with hypoxia with chief complaints of shortness of breath with fatigue.  Non-small cell lung cancer, adenocarcinoma with bone mets - Initially diagnosed December 2024 - Initiated first-line treatment with carboplatin , paclitaxel  and Keytruda x 3 cycles.  Unfortunately due to progression of disease, second line treatment with docetaxel  and ramucirumab  was started.  Ramucirumab  discontinued secondary to recent acute subarachnoid hemorrhage. - Supportive care with Neulasta  and steroids have been included in his treatment regimen. - Patient was last seen by medical oncology/Dr. Liam Redhead on 10/25/2023.  Anemia, macrocytic - Likely multifactorial secondary to malignancy, recent chemotherapy, poor nutritional status -Hemoglobin 7.1 today.  Agree with PRBC transfusion at this time. - Recommend PRBC transfusion for hemoglobin <7.0-7.5 - Monitor CBC with differential  Thrombocytopenia - Mild - Platelets 112K today.  No transfusional intervention required at this time - Monitor closely for bleeding - Continue to monitor CBC with differential  Acute hypoxic respiratory failure Shortness of breath -- appears improved, patient denies sob.   -- on O2 via [place], noted O2 sat in upper 90s -- per RN, plans for thoracentesis today. -- continue to monitor resp status closely   Generalized weakness Fatigue -- likely sencondary to malignancy and poor performance status - Continue supportive care   Code Status Full  Subjective:  Patient seen resting comfortably with O2 via nasal cannula intact.  Awakens easily.  States no pain, admits that his breathing is better.  Appears chronically ill and weak.  No other acute complaints  offered.   Objective:  No intake or output data in the 24 hours ending 10/31/23 0930   PHYSICAL EXAMINATION: ECOG PERFORMANCE STATUS: 4 - Bedbound  Vitals:   10/31/23 0530 10/31/23 0800  BP:    Pulse:    Resp: 15   Temp:  98.1 F (36.7 C)  SpO2:     There were no vitals filed for this visit.  GENERAL: alert, +chronically ill-appearing SKIN: + Pale skin color, texture, turgor are normal, no rashes or significant lesions EYES: normal, conjunctiva are pink and non-injected, sclera clear OROPHARYNX: no exudate, no erythema and lips, buccal mucosa, and tongue normal  NECK: supple, thyroid  normal size, non-tender, without nodularity LYMPH: no palpable lymphadenopathy in the cervical, axillary or inguinal LUNGS: clear to auscultation and percussion with normal breathing effort HEART: 1+ lower extremity edema ABDOMEN: abdomen soft, non-tender and normal bowel sounds MUSCULOSKELETAL: no cyanosis of digits and no clubbing  PSYCH: alert & oriented x 3 with fluent speech NEURO: no focal motor/sensory deficits   All questions were answered. The patient knows to call the clinic with any problems, questions or concerns.   The total time spent in the appointment was 40 minutes encounter with patient including review of chart and various tests results, discussions about plan of care and coordination of care plan  Annemarie Barry Miel Wisener, NP 10/31/2023 9:30 AM    Labs Reviewed:  Lab Results  Component Value Date   WBC 11.7 (H) 10/31/2023   HGB 7.1 (L) 10/31/2023   HCT 24.2 (L) 10/31/2023   MCV 114.2 (H) 10/31/2023   PLT 112 (L) 10/31/2023   Recent Labs    10/25/23 0921 10/30/23 1945 10/31/23 0310  NA 140 142 136  K 4.3 4.3 4.1  CL 108 110 108  CO2 24 19* 18*  GLUCOSE 116* 49* 118*  BUN 36* 57* 53*  CREATININE 1.36* 1.77* 1.66*  CALCIUM  9.0 9.0 8.8*  GFRNONAA 55* 40* 44*  PROT 6.2* 6.0* 5.7*  ALBUMIN 3.5 2.8* 2.6*  AST 12* 14* 12*  ALT 6 9 9   ALKPHOS 155* 164* 151*  BILITOT  0.7 0.7 1.0  BILIDIR  --   --  0.2  IBILI  --   --  0.8    Studies Reviewed:  CT Angio Chest Pulmonary Embolism (PE) W or WO Contrast Result Date: 10/31/2023 CLINICAL DATA:  Shortness of breath, low O2 sats. EXAM: CT ANGIOGRAPHY CHEST WITH CONTRAST TECHNIQUE: Multidetector CT imaging of the chest was performed using the standard protocol during bolus administration of intravenous contrast. Multiplanar CT image reconstructions and MIPs were obtained to evaluate the vascular anatomy. RADIATION DOSE REDUCTION: This exam was performed according to the departmental dose-optimization program which includes automated exposure control, adjustment of the mA and/or kV according to patient size and/or use of iterative reconstruction technique. CONTRAST:  60mL OMNIPAQUE  IOHEXOL  350 MG/ML SOLN COMPARISON:  Chest CT 09/11/2023. FINDINGS: Cardiovascular: No filling defects in the pulmonary arteries to suggest pulmonary emboli. Heart is normal size. Aorta is normal caliber. Right Port-A-Cath remains in place, unchanged. Mediastinum/Nodes: Infiltrating tumor invading the left side of the mediastinum and into the left AP window, progressed since prior study. Worsening mediastinal and bilateral hilar adenopathy. No visible axillary adenopathy. Lungs/Pleura: Large loculated left pleural effusion and moderate right pleural effusion, increasing since prior study. Increasing size and number of metastases throughout the lungs. Central left upper lobe mass now measures 4.8 x 4.1 cm compared to 4.3 x 3.6 cm. Progressive tumor extension and endobronchial spread throughout the left upper lobe with confluent soft tissue thickening again noted along the peribronchovascular bundle, worsening since prior study. Extensive airspace disease throughout the right upper lobe could reflect pneumonia or lymphangitic spread of tumor. Upper Abdomen: No acute findings Musculoskeletal: Soft tissue nodule superficial to xiphoid process measures 2.6 cm,  stable. Diffuse osseous metastatic disease again noted, likely slightly progressed since prior study. Review of the MIP images confirms the above findings. IMPRESSION: No evidence of pulmonary embolus. Enlarging left upper lobe mass with worsening spread a along the peribronchovascular bundle in the left upper lobe. Increasing size and number of pulmonary metastases/nodules. Worsening mediastinal and bilateral hilar adenopathy. Enlarging bilateral pleural effusions, left larger than right. Airspace disease throughout the right upper lobe could reflect pneumonia or lymphangitic spread of tumor. Diffuse osseous metastatic disease, likely progressed since prior study. Electronically Signed   By: Janeece Mechanic M.D.   On: 10/31/2023 00:11   DG Chest Port 1 View Result Date: 10/30/2023 CLINICAL DATA:  Shortness of breath EXAM: PORTABLE CHEST 1 VIEW COMPARISON:  10/05/2023, CT 09/11/2023 FINDINGS: Right-sided central venous port tip at the cavoatrial junction. Small right-sided pleural effusion, small moderate probably loculated left pleural effusion, increased compared to prior radiograph. Distortion and irregular density in the left upper lobe, perihilar region appears more dense radiographically compared to prior. Persistent dense left lung base consolidation. New or worsened diffuse right-sided pulmonary infiltrates or edema. No pneumothorax IMPRESSION: 1. Small right-sided pleural effusion, small to moderate probably loculated left pleural effusion, increased compared to prior radiograph. 2. New or worsened diffuse right-sided pulmonary infiltrates or edema. 3. Distortion and irregular density in the left upper lobe, perihilar region appears more dense radiographically compared to prior and corresponds to known history of malignancy.  Persistent dense left lung base consolidation. Electronically Signed   By: Esmeralda Hedge M.D.   On: 10/30/2023 20:42   DG Chest Port 1 View Result Date: 10/05/2023 CLINICAL DATA:   Chest pain EXAM: PORTABLE CHEST 1 VIEW COMPARISON:  October 02, 2023 FINDINGS: No change in the right IJ Infuse-A-Port catheter Persistent improving left lung pneumonia infiltrates. With some infiltrates of the right lung as well without consolidations. Small left effusion Heart normal size IMPRESSION: Improving left lung pneumonia. Electronically Signed   By: Fredrich Jefferson M.D.   On: 10/05/2023 13:31   CT Head Wo Contrast Result Date: 10/03/2023 CLINICAL DATA:  Subarachnoid hemorrhage follow-up EXAM: CT HEAD WITHOUT CONTRAST TECHNIQUE: Contiguous axial images were obtained from the base of the skull through the vertex without intravenous contrast. RADIATION DOSE REDUCTION: This exam was performed according to the departmental dose-optimization program which includes automated exposure control, adjustment of the mA and/or kV according to patient size and/or use of iterative reconstruction technique. COMPARISON:  Yesterday FINDINGS: Brain: Similar pattern and degree of patchy subarachnoid hemorrhage scattered along bilateral sulci, up to 7 mm in thickness at the left vertex. Trace left parafalcine subdural hemorrhage is noted along the posterior falx, stable. No evidence of infarct, hydrocephalus, or brain edema. There is cerebral volume loss with mild low-density in the cerebral white matter. No evidence of acute infarct. Vascular: No hyperdense vessel or unexpected calcification. Skull: Lytic appearance at the right occipital condyle, known osseous metastatic disease by prior report. Sinuses/Orbits: No acute finding IMPRESSION: Unchanged patchy subarachnoid hemorrhage along the bilateral cerebral convexity when compared to yesterday. Trace left parafalcine subdural hematoma shows no interval thickening. Electronically Signed   By: Ronnette Coke M.D.   On: 10/03/2023 05:39   DG Pelvis Portable Result Date: 10/02/2023 CLINICAL DATA:  Fall with left hip pain. EXAM: PORTABLE PELVIS 1-2 VIEWS COMPARISON:  CT  09/11/2023 FINDINGS: IM rod and screw fixation left femur. No acute fracture or dislocation. Permeative of appearance of the medial cortex of the proximal left femur suspicious for metastases. IMPRESSION: No acute fracture or dislocation. Permeative of appearance of the medial cortex of the proximal left femur suspicious for metastases. Electronically Signed   By: Rozell Cornet M.D.   On: 10/02/2023 20:44   DG Chest Portable 1 View Result Date: 10/02/2023 CLINICAL DATA:  Lung cancer. EXAM: PORTABLE CHEST 1 VIEW COMPARISON:  None Available. FINDINGS: Unremarkable cardiac size. Extensive alveolar opacification left mid to lower lung consistent with pneumonia. Left-sided moderate pleural effusion. No pneumothorax. There is small area of alveolar opacity in the right mid hemithorax. Right-sided Port-A-Cath tip overlies distal SVC. IMPRESSION: Bilateral pulmonary opacities, left greater than right consistent with pneumonia, edema or mass. Left-sided effusion. Electronically Signed   By: Sydell Eva M.D.   On: 10/02/2023 20:40   CT Head Wo Contrast Addendum Date: 10/02/2023 ADDENDUM REPORT: 10/02/2023 20:36 ADDENDUM: Critical Value/emergent results were called by telephone at the time of interpretation on 10/02/2023 at 8:36 pm to provider [facility] , who verbally acknowledged these results. Electronically Signed   By: Juanetta Nordmann M.D.   On: 10/02/2023 20:36   Result Date: 10/02/2023 CLINICAL DATA:  Fall EXAM: CT HEAD WITHOUT CONTRAST CT CERVICAL SPINE WITHOUT CONTRAST TECHNIQUE: Multidetector CT imaging of the head and cervical spine was performed following the standard protocol without intravenous contrast. Multiplanar CT image reconstructions of the cervical spine were also generated. RADIATION DOSE REDUCTION: This exam was performed according to the departmental dose-optimization program which includes automated exposure  control, adjustment of the mA and/or kV according to patient size and/or use of  iterative reconstruction technique. COMPARISON:  09/30/2023 FINDINGS: CT HEAD FINDINGS Brain: Multifocal acute subarachnoid hemorrhage over both hemispheres, greatest at the right sylvian fissure. The amount of hemorrhage is worsened since the prior examination. No midline shift or other mass effect. Confluent white matter hypoattenuation. Mild volume loss. Vascular: No hyperdense vessel or unexpected calcification. Skull: No fracture. Sinuses/Orbits: No acute finding. Other: None. CT CERVICAL SPINE FINDINGS Alignment: Normal. Skull base and vertebrae: There is widespread osseous metastatic disease affecting the ribs, cervical spine and lower skull base. Unchanged appearance of pathologic fractures at T1 and C6. No new fracture. Soft tissues and spinal canal: Hyperdense foci within the left subarticular zone at the C1-2 level. Disc levels:  No high-grade spinal canal stenosis. Upper chest: 6 mm right apical pulmonary nodule. Incompletely visualized left pleural effusion. Other: None IMPRESSION: 1. Multifocal acute subarachnoid hemorrhage over both hemispheres, greatest at the right Sylvian fissure. The amount of hemorrhage is worsened since the prior examination. No midline shift or other mass effect. 2. Widespread osseous metastatic disease affecting the ribs, cervical spine and lower skull base. Unchanged appearance of pathologic fractures at T1 and C6. No new fracture. 3. Unchanged hyperdense foci within the left subarticular zone at the C1-2 level, favored to be a meningioma. 4. Incompletely visualized left pleural effusion. 5. Unchanged 6 mm right apical pulmonary nodule. Electronically Signed: By: Juanetta Nordmann M.D. On: 10/02/2023 20:27   CT Cervical Spine Wo Contrast Addendum Date: 10/02/2023 ADDENDUM REPORT: 10/02/2023 20:36 ADDENDUM: Critical Value/emergent results were called by telephone at the time of interpretation on 10/02/2023 at 8:36 pm to provider [facility] , who verbally acknowledged these  results. Electronically Signed   By: Juanetta Nordmann M.D.   On: 10/02/2023 20:36   Result Date: 10/02/2023 CLINICAL DATA:  Fall EXAM: CT HEAD WITHOUT CONTRAST CT CERVICAL SPINE WITHOUT CONTRAST TECHNIQUE: Multidetector CT imaging of the head and cervical spine was performed following the standard protocol without intravenous contrast. Multiplanar CT image reconstructions of the cervical spine were also generated. RADIATION DOSE REDUCTION: This exam was performed according to the departmental dose-optimization program which includes automated exposure control, adjustment of the mA and/or kV according to patient size and/or use of iterative reconstruction technique. COMPARISON:  09/30/2023 FINDINGS: CT HEAD FINDINGS Brain: Multifocal acute subarachnoid hemorrhage over both hemispheres, greatest at the right sylvian fissure. The amount of hemorrhage is worsened since the prior examination. No midline shift or other mass effect. Confluent white matter hypoattenuation. Mild volume loss. Vascular: No hyperdense vessel or unexpected calcification. Skull: No fracture. Sinuses/Orbits: No acute finding. Other: None. CT CERVICAL SPINE FINDINGS Alignment: Normal. Skull base and vertebrae: There is widespread osseous metastatic disease affecting the ribs, cervical spine and lower skull base. Unchanged appearance of pathologic fractures at T1 and C6. No new fracture. Soft tissues and spinal canal: Hyperdense foci within the left subarticular zone at the C1-2 level. Disc levels:  No high-grade spinal canal stenosis. Upper chest: 6 mm right apical pulmonary nodule. Incompletely visualized left pleural effusion. Other: None IMPRESSION: 1. Multifocal acute subarachnoid hemorrhage over both hemispheres, greatest at the right Sylvian fissure. The amount of hemorrhage is worsened since the prior examination. No midline shift or other mass effect. 2. Widespread osseous metastatic disease affecting the ribs, cervical spine and lower skull  base. Unchanged appearance of pathologic fractures at T1 and C6. No new fracture. 3. Unchanged hyperdense foci within the left subarticular zone  at the C1-2 level, favored to be a meningioma. 4. Incompletely visualized left pleural effusion. 5. Unchanged 6 mm right apical pulmonary nodule. Electronically Signed: By: Juanetta Nordmann M.D. On: 10/02/2023 20:27

## 2023-10-31 NOTE — Assessment & Plan Note (Signed)
Failure to thrive.    

## 2023-10-31 NOTE — Progress Notes (Addendum)
 Progress Note   Patient: Jonathan Page VHQ:[id number] DOB: 1950-07-08 DOA: 10/30/2023     1 DOS: the patient was seen and examined on 10/31/2023 at 7:58AM      Brief hospital course: 73 y.o. M with NSCLC metastatic to LN and bone, progressing despite second line therapy, IDA, DM and HTN who presented with shortness of breath and weakness, progressing over several days.  In the ER, CTA chest ruled out PT, showed enlarging LUL mass, worsening bilateral mets, new infiltrates and bilateral pleural effusions.  Cr up to 1.7  He was admitted on antibiotics.  Thoracentesis ordered.     Assessment and Plan: * Acute respiratory failure with hypoxia (HCC) Pleural effusions, L>R Hypoxia due to effusions, increased airspace disease.  Latter is likely spread of tumor, but cannot rule out infection.  Will attempt to mitigate effusions with thora.  If no improvement with antibiotics, prognosis likely dire. - Thoracentesis - Follow cultures and cytology - Continue cefepime - Consult Oncology - Consult Palliative Care - Chest PT    ADDENDUM:  Entrapped lung Post-thora chest x-ray shows left lower pneumothorax.  Reviewed with CCM, appears to be entrapped lung, not unexpected with this degree of cancer.  Hemodynamically stable. - Analgesics - Serial x-ray - Given overall prognosis, if no enlargement with x-ray this evening or tomorrow morning, recommend conservative mgmt     Iron  deficiency anemia Hgb down to 7.1 g/dL today.  Given hypoxia, symptomatic anemia, will transfuse. - Transfuse 1u PRBCs - Trend Hgb - Continue folate   Non-small cell lung cancer metastatic to bone [facility]) - Consult Oncology  ADDENDUM: Discussed with Dr. Marguerita Shih, given progression of disease on CTA and worsening functional status, they recommend no further chemo.    Hypoglycemia Failure to thrive  AKI (acute kidney injury) (HCC) Cr 1.7 on admission from baseline 1.3.  Improved with fluids overnight. -  Transfuse and trend Hgb - Albumin - Hold lisinopril   Uncontrolled type 2 diabetes mellitus with hyperglycemia, without long-term current use of insulin  (HCC) Neuropathy Glucose low normal - Continue SS corrections - Hold glipizide , lispro, 70/30 - Continue gabapentin   Thrombocytopenia (HCC) Platelets unchanged 112K   Essential hypertension Hypotensive at this time. Sepsis was doubted at admission. Tachycardia from advance pulmonary cancer.   - Hold lisinopril           Subjective: No bleeding observed.  No fever.  Very weak.  Still out of breath and fatigued.  No confusion, no new pain.Thoracentesis later today     Physical Exam: BP (!) 86/58   Pulse (!) 105   Temp 98.1 F (36.7 C) (Axillary)   Resp 15   Ht 6\' 1"  (1.854 m)   SpO2 98%   BMI 18.47 kg/m   Adult male, frail, lying in bed, no acute distress, appears weak and tired and debilitated Tachycardic, regular, no murmurs, no pitting edema in the extremities, no JVD Respiratory rate diminished bilaterally, I do not appreciate rales Abdomen soft, no tenderness to palpation or guarding, no ascites Attention normal, affect blunted by fatigue, judgment and insight appear normal    Data Reviewed: Discussed with oncology and palliative care Basic metabolic panel shows nongap acidosis slightly worse, renal function slightly improved Elevated alkaline phosphatase due to mets, bilirubin normal CBC shows worsening anemia, white count slightly down, platelets down to 1 12K CTA chest report reviewed, summarized above    Family Communication: Partner by phone    Disposition: Status is: Inpatient Patient with progressive metastatic cancer presented with new  effusions, progressed cancer, new airspace infiltrates.  Will try to mitigate with thoracentesis, antibiotics.  Oncology and Palliative involved, if no improvement in 2-3 days, would recommend transitioning to comfort  measures.        Author: Ephriam Hashimoto, MD 10/31/2023 12:51 PM  For on call review [email or web address].

## 2023-10-31 NOTE — Assessment & Plan Note (Signed)
 Hypotensive at this time. Sepsis was doubted at admission. Tachycardia from advance pulmonary cancer.   - Hold lisinopril 

## 2023-10-31 NOTE — Plan of Care (Signed)
  Problem: Education: Goal: Ability to describe self-care measures that may prevent or decrease complications (Diabetes Survival Skills Education) will improve Outcome: Progressing   Problem: Coping: Goal: Ability to adjust to condition or change in health will improve 10/31/2023 1828 by Taffy Fabian, RN Outcome: Progressing 10/31/2023 1828 by Taffy Fabian, RN Outcome: Progressing   Problem: Fluid Volume: Goal: Ability to maintain a balanced intake and output will improve 10/31/2023 1828 by Taffy Fabian, RN Outcome: Progressing 10/31/2023 1828 by Taffy Fabian, RN Outcome: Progressing   Problem: Health Behavior/Discharge Planning: Goal: Ability to identify and utilize available resources and services will improve Outcome: Progressing Goal: Ability to manage health-related needs will improve Outcome: Progressing   Problem: Nutritional: Goal: Maintenance of adequate nutrition will improve Outcome: Progressing Goal: Progress toward achieving an optimal weight will improve Outcome: Progressing

## 2023-10-31 NOTE — IPAL (Signed)
  Interdisciplinary Goals of Care Family Meeting   Date carried out: 10/31/2023  Location of the meeting: Bedside  Member's involved: Nurse Practitioner and Family Member or next of kin  Durable Power of Attorney or acting medical decision maker: Patient does not have a documented advanced directive however in the setting of no document Jonathan Page is alert and able to engage appropriately in discussions. He expresses his significant Other, Jonathan Page would be his surrogate decision maker in support of his sons. Request she be included in all discussions and decisions.     Discussion: We discussed goals of care for Jonathan Page including cancer progression, per Dr. Marguerita Shih (Oncologist) patient is no longer a candidate for further oncological interventions/treatment due to progression and poor overall performance status, continued decline in quality of life, disease progression despite second line therapies.   Jonathan Page and Jonathan are realistic in their understanding and recommendations. He is clear in expressed wishes to continue to treat until his sons are able to arrive tomorrow with a goal of further making complex decisions. He is open to hospice support in the setting of comfort focused care once all family expressed their support and agreement to decisions. Jonathan is in agreement.   Confirms wishes for DNR/DNI. No desire to suffer. Education provided on comfort focused care in addition to goals and philosophy of hospice care both residential and outpatient in the home.   Code status:   Code Status: Limited: Do not attempt resuscitation (DNR) -DNR-LIMITED -Do Not Intubate/DNI    Disposition: Continue current acute care until further discussions on Wednesday 11/01/23 with family. Leaning towards home with hospice versus inpatient hospice facility for what time he has left.   Time spent for the meeting: 35 min.     Bertis Brochure, NP  10/31/2023, 10:11 PM

## 2023-11-01 ENCOUNTER — Inpatient Hospital Stay (HOSPITAL_COMMUNITY)

## 2023-11-01 DIAGNOSIS — R4589 Other symptoms and signs involving emotional state: Secondary | ICD-10-CM

## 2023-11-01 DIAGNOSIS — J9601 Acute respiratory failure with hypoxia: Secondary | ICD-10-CM | POA: Diagnosis not present

## 2023-11-01 DIAGNOSIS — L8990 Pressure ulcer of unspecified site, unspecified stage: Secondary | ICD-10-CM | POA: Insufficient documentation

## 2023-11-01 DIAGNOSIS — C3490 Malignant neoplasm of unspecified part of unspecified bronchus or lung: Secondary | ICD-10-CM | POA: Diagnosis not present

## 2023-11-01 DIAGNOSIS — S066X0D Traumatic subarachnoid hemorrhage without loss of consciousness, subsequent encounter: Secondary | ICD-10-CM | POA: Diagnosis not present

## 2023-11-01 DIAGNOSIS — J95811 Postprocedural pneumothorax: Secondary | ICD-10-CM

## 2023-11-01 DIAGNOSIS — Z7189 Other specified counseling: Secondary | ICD-10-CM

## 2023-11-01 DIAGNOSIS — J939 Pneumothorax, unspecified: Secondary | ICD-10-CM | POA: Insufficient documentation

## 2023-11-01 DIAGNOSIS — J910 Malignant pleural effusion: Secondary | ICD-10-CM | POA: Insufficient documentation

## 2023-11-01 DIAGNOSIS — Z66 Do not resuscitate: Secondary | ICD-10-CM

## 2023-11-01 DIAGNOSIS — Z515 Encounter for palliative care: Secondary | ICD-10-CM | POA: Diagnosis not present

## 2023-11-01 DIAGNOSIS — G893 Neoplasm related pain (acute) (chronic): Secondary | ICD-10-CM

## 2023-11-01 DIAGNOSIS — J439 Emphysema, unspecified: Secondary | ICD-10-CM | POA: Diagnosis not present

## 2023-11-01 DIAGNOSIS — N179 Acute kidney failure, unspecified: Secondary | ICD-10-CM | POA: Diagnosis not present

## 2023-11-01 DIAGNOSIS — C3492 Malignant neoplasm of unspecified part of left bronchus or lung: Secondary | ICD-10-CM | POA: Diagnosis not present

## 2023-11-01 DIAGNOSIS — M84552D Pathological fracture in neoplastic disease, left femur, subsequent encounter for fracture with routine healing: Secondary | ICD-10-CM | POA: Diagnosis not present

## 2023-11-01 DIAGNOSIS — J90 Pleural effusion, not elsewhere classified: Secondary | ICD-10-CM | POA: Diagnosis not present

## 2023-11-01 LAB — GLUCOSE, CAPILLARY
Glucose-Capillary: 174 mg/dL — ABNORMAL HIGH (ref 70–99)
Glucose-Capillary: 165 mg/dL — ABNORMAL HIGH (ref 70–99)
Glucose-Capillary: 184 mg/dL — ABNORMAL HIGH (ref 70–99)

## 2023-11-01 LAB — CBC
HCT: 24.8 % — ABNORMAL LOW (ref 39.0–52.0)
Hemoglobin: 7.9 g/dL — ABNORMAL LOW (ref 13.0–17.0)
MCH: 33.6 pg (ref 26.0–34.0)
MCHC: 31.9 g/dL (ref 30.0–36.0)
MCV: 105.5 fL — ABNORMAL HIGH (ref 80.0–100.0)
Platelets: 112 10*3/uL — ABNORMAL LOW (ref 150–400)
RBC: 2.35 MIL/uL — ABNORMAL LOW (ref 4.22–5.81)
RDW: 23 % — ABNORMAL HIGH (ref 11.5–15.5)
WBC: 10.3 10*3/uL (ref 4.0–10.5)
nRBC: 3.2 % — ABNORMAL HIGH (ref 0.0–0.2)

## 2023-11-01 LAB — BASIC METABOLIC PANEL WITH GFR
Anion gap: 6 (ref 5–15)
BUN: 47 mg/dL — ABNORMAL HIGH (ref 8–23)
CO2: 19 mmol/L — ABNORMAL LOW (ref 22–32)
Calcium: 8.8 mg/dL — ABNORMAL LOW (ref 8.9–10.3)
Chloride: 109 mmol/L (ref 98–111)
Creatinine, Ser: 1.59 mg/dL — ABNORMAL HIGH (ref 0.61–1.24)
GFR, Estimated: 46 mL/min — ABNORMAL LOW (ref >60)
Glucose, Bld: 157 mg/dL — ABNORMAL HIGH (ref 70–99)
Potassium: 4.1 mmol/L (ref 3.5–5.1)
Sodium: 134 mmol/L — ABNORMAL LOW (ref 135–145)

## 2023-11-01 LAB — MRSA NEXT GEN BY PCR, NASAL: MRSA by PCR Next Gen: DETECTED — AB

## 2023-11-01 MED ORDER — LACTATED RINGERS IV SOLN: INTRAVENOUS | Status: AC

## 2023-11-01 MED ORDER — ALBUMIN HUMAN 25 % IV SOLN
25.0000 g | Freq: Four times a day (QID) | INTRAVENOUS | Status: AC
  Administered 2023-11-01 – 2023-11-02 (×2): 25 g via INTRAVENOUS
  Filled 2023-11-01 (×2): qty 100

## 2023-11-01 MED ORDER — FENTANYL CITRATE PF 50 MCG/ML IJ SOSY
25.0000 ug | PREFILLED_SYRINGE | INTRAMUSCULAR | Status: DC | PRN
  Administered 2023-11-01 – 2023-11-02 (×3): 25 ug via INTRAVENOUS
  Filled 2023-11-01 (×4): qty 1

## 2023-11-01 NOTE — Progress Notes (Signed)
     Patient Name: Jonathan Page           DOB: 1951-02-10  MRN: [id number]      Admission Date: 10/30/2023  Attending Provider: Lonita Roach, MD  Primary Diagnosis: Acute respiratory failure with hypoxia (HCC)   Level of care: Stepdown   OVERNIGHT PROGRESS REPORT   Notified by bedside RN of patient being hypotensive, SBP 70-80 with MAP 50's.  While low blood pressure is a known issue, recent trends indicate a downward progression. IV albumin has been ordered. Per palliative care note, family is wanting to focus on patient's comfort moving forward. Patient's current code status is DNR/DNI.   I spoke with family over the phone--significant other (Jonathan Page) and 2 sons.  During this discussion, we reviewed the pt's current clinical status and goals of care. The family unanimously agreed to transition the patient to a comfort care and emphasize symptom management for pain, anxiety, shortness of breath, and any other discomforts. Patient code status changed- DNR/DNI- comfort. Bedside RN aware of change.    Hjalmar Ballengee, DNP, ACNPC- AG [facility] [place]

## 2023-11-01 NOTE — Plan of Care (Signed)
  Problem: Coping: Goal: Ability to adjust to condition or change in health will improve Outcome: Progressing   Problem: Metabolic: Goal: Ability to maintain appropriate glucose levels will improve Outcome: Progressing   Problem: Skin Integrity: Goal: Risk for impaired skin integrity will decrease Outcome: Progressing   Problem: Education: Goal: Knowledge of General Education information will improve Description: Including pain rating scale, medication(s)/side effects and non-pharmacologic comfort measures Outcome: Progressing   Problem: Clinical Measurements: Goal: Ability to maintain clinical measurements within normal limits will improve Outcome: Progressing   Problem: Nutrition: Goal: Adequate nutrition will be maintained Outcome: Progressing   Problem: Coping: Goal: Level of anxiety will decrease Outcome: Progressing   Problem: Pain Managment: Goal: General experience of comfort will improve and/or be controlled Outcome: Progressing   Problem: Safety: Goal: Ability to remain free from injury will improve Outcome: Progressing

## 2023-11-01 NOTE — Progress Notes (Signed)
 Daily Progress Note   Patient Name: Jonathan Page       Date: 11/01/2023 DOB: 08/03/1950  Age: 73 y.o. MRN#: [id number] Attending Physician: Lonita Roach, MD Primary Care Physician: [name], NP Admit Date: 10/30/2023 Length of Stay: 2 days  Reason for Consultation/Follow-up: Establishing goals of care  Subjective:   CC:  Subjective:  Reviewed EMR prior to presenting to bedside. Discussed care with PMT provider who saw patient on 5/20.  Reviewed recent hospitalist, IR, and TOC documentation.  Review of vital signs noting hypotension throughout today. At time of EMR review in the past 24 hours, patient has required IV as needed fentanyl  25mcg q2 hours x4 doses.   Presented to bedside in afternoon once informed patient's sons were at bedside.  Able to call patient's significant other as well and noted she would be at bedside shortly.  Presented to bedside when significant other and 2 sons were present.  Able to introduce myself as a member of the palliative medicine team my role in patient's medical journey.  Able to provide medical updates regarding patient's cancer progression.  Discussed patient is not a candidate for further cancer directed therapies.  Discussed at this time would focus on patient's comfort moving forward including management of symptoms such as pain, nausea vomiting, and shortness of breath.  Introduced the idea of hospice and discussed where hospice can be provided such as at home versus long-term care with hospice versus inpatient hospice facility.  Spent time providing general details regarding each place.  After discussion, patient and family opted to pursue evaluation for inpatient hospice.  Family would prefer patient remain here in [place] with [facility] and [facility] so noted would involve TOC and ACC liaison.  Will await [facility] liaison evaluation to determine next steps of care.  Discussed at this time will still focus on patient's comfort.  Will  not pursue lab work or imaging that is not going to change the patient's overall prognosis.  Patient and family agreement this plan.  Spent time providing emotional support via active listening.  All questions answered at that time.  Noted palliative medicine team to continue to follow along with patient's medical journey.  Updated care team including hospitalist, RN, [facility], [facility] liaison, and outpatient PMT provider regarding discussion.  Objective:   Vital Signs:  BP (!) 83/62   Pulse (!) 110   Temp 97.8 F (36.6 C) (Oral)   Resp 19   Ht 6\' 1"  (1.854 m)   SpO2 95%   BMI 18.47 kg/m   Physical Exam: General: NAD, alert, frail, chronically ill appearing  Cardiovascular: RRR Respiratory: no increased work of breathing noted, not in respiratory distress Abdomen: not distended Neuro: A&Ox3, following commands easily Psych: appropriately answers all questions  Imaging:  I personally reviewed recent imaging.   Assessment & Plan:   Assessment: Palliative Care consult requested for goals of care discussion in this 73 y.o. male  with past medical history of non-small cell lung cancer (05/2024) with metastatic disease to spine and widespread bone on second line treatment of ramucirumab  and doxetal due to progress, recent subarachnoid hemorrhage s/p fall, diabetes, hypertension, and seasonal allergies. He was admitted on 10/30/2023 with weakness, increased shortness of breath. CT showed bilateral pleural effusion, worsening mets, and enlarged left upper lobe mass. Palliative consulted for goals of care discussions.    Recommendations/Plan: # Complex medical decision making/goals of care:  - Discussed care with patient, patient's significant other, and patient's 2 sons as  detailed above in HPI.  Discussed patient's current diagnosis of metastatic non-small cell lung cancer; patient is not a candidate for further cancer directed therapies.  At this time going to focus on patient's comfort moving  forward.  Discussed will not continue interventions such as lab work or imaging as would not change overall prognosis.  Patient and family seeking evaluation for inpatient hospice at [facility] with [facility].  -  Code Status: Limited: Do not attempt resuscitation (DNR) -DNR-LIMITED -Do Not Intubate/DNI   # Symptom management: Patient is receiving these palliative interventions for symptom management with an intent to improve quality of life.   -Pain, severe in setting of metastatic NSCLC    - Change fentanyl  25mcg to q 30 minutes as needed   -Continue gabapentin  300mg  at bedtime    - Discontinue oxycodone     -Nausea   -Continue prochlorperazine  5mg  q6hrs as needed    -Constipation    -Continue Senna 1 tab daily as needed   # Psychosocial Support:  -significant other, sons   # Discharge Planning: To Be Determined  - Family requesting evaluation for inpatient hospice at [facility].  TOC and ACC liaison involved.  Discussed with: Patient, patient's significant other, patient's 2 sons, hospitalist, RN, TOC, ACC liaison, outpatient PMT provider  Thank you for allowing the palliative care team to participate in the care [name].  Barnett Libel, DO Palliative Care Provider PMT # [phone]  If patient remains symptomatic despite maximum doses, please call PMT at [phone] between 0700 and 1900. Outside of these hours, please call attending, as PMT does not have night coverage.  Personally spent 50 minutes in patient care including extensive chart review (labs, imaging, progress/consult notes, vital signs), medically appropraite exam, discussed with treatment team, education to patient, family, and staff, documenting clinical information, medication review and management, coordination of care, and available advanced directive documents.

## 2023-11-01 NOTE — Progress Notes (Signed)
 Progress Note   Patient: Jonathan Page WRU:[id number] DOB: Nov 02, 1950 DOA: 10/30/2023     2 DOS: the patient was seen and examined on 11/01/2023   Brief hospital course: 73 year old man PMH non-small cell lung cancer metastatic to bone, progressive despite second line therapy, presented with shortness of breath.  CT showed progression of disease, patient admitted for new hypoxia with respiratory failure, further evaluation.  Treated with antibiotics, underwent thoracentesis with small postprocedure pneumothorax which is stable.  Pulmonology has recommended conservative management.  Patient appears to be asymptomatic.  Seen by oncology with recommendation for no further chemotherapy.  Seen by palliative care, considering hospice.  Continuing current care pending further discussions with family.  Consultants Oncology  Palliative medicine  Procedures/Events 5/20 left thoracentesis   Assessment and Plan: * Acute respiratory failure with hypoxia (HCC) Malignant left pleural effusion Right pleural effusion Non-small cell lung cancer metastatic to bone (HCC) Small left-sided pneumothorax status postthoracentesis Admitted for acute hypoxic respiratory failure, now on oxygen.  Plan pulm on oxygen.  Likely secondary to progression of disease but infection could not be ruled out.  Treated empirically with antibiotics. CT no PE.  Enlarging left upper lobe mass with worsening spread, increasing size and number of pulmonary metastases and nodules, enlarging bilateral pleural effusions, right upper lobe airspace disease pneumonia versus lymphangitic spread, diffuse osseous metastatic disease. Small pneumothorax status postthoracentesis.  Pulmonology recommended conservative management if repeat chest x-ray stable.  Repeat chest x-ray showed stable left apical and lateral hydropneumothorax, no evidence of tension physiology.  Continue conservative management. Per oncology not a candidate for further  oncologic intervention.     Iron  deficiency anemia Thrombocytopenia (HCC) Anemia likely multifactorial secondary to malignancy, recent chemotherapy, poor nutritional status  Platelets unchanged 112K Hemoglobin up to 7.9 status post 1 unit PRBC as appropriate.   AKI (acute kidney injury) (HCC) NAGMA Cr 1.7 on admission from baseline 1.3.  Improved with fluids overnight. Continue to trend.  Encourage oral intake.  Hold lisinopril .   Uncontrolled type 2 diabetes mellitus with hyperglycemia, without long-term current use of insulin  (HCC) Neuropathy Hypoglycemia Glucose low normal.  Hypoglycemia resolved. Holding glipizide , insulin . Continue gabapentin .   Essential hypertension Mild hypotension at this time. Sepsis doubted at admission. Tachycardia from advance pulmonary cancer.   Appears stable.  Continue to hold lisinopril   PMH SAH one month ago  Disposition From: [facility] To: TBD, possibly home with hospice vs residential facility     Subjective:  Feels okay, slept okay.  Breathing okay.  Physical Exam: Vitals:   11/01/23 0200 11/01/23 0330 11/01/23 0400 11/01/23 0500  BP: (!) 98/59  (!) 96/59 (!) 100/57  Pulse:      Resp: (!) 30  (!) 26   Temp:  97.8 F (36.6 C)    TempSrc:  Axillary    SpO2:   96% 93%  Height:       Physical Exam Vitals reviewed.  Constitutional:      General: He is not in acute distress.    Appearance: He is not ill-appearing or toxic-appearing.  Cardiovascular:     Rate and Rhythm: Regular rhythm. Tachycardia present.     Heart sounds: No murmur heard.    Comments: Trace sinus tachycardia Pulmonary:     Effort: No respiratory distress.     Breath sounds: No wheezing, rhonchi or rales.     Comments: Mild increased respiratory effort. Diminished breath sounds, especially on left.  Musculoskeletal:     Right lower leg:  Edema present.     Left lower leg: Edema present.     Comments: Bilateral pedal edema   Neurological:     Mental Status: He is alert.  Psychiatric:        Mood and Affect: Mood normal.        Behavior: Behavior normal.     Data Reviewed: CBG stable Creatinen down to 1.59 Hgb up to 7.9 Plts stable 112  Family Communication: none present  Disposition: Status is: Inpatient Remains inpatient appropriate because: acute resp failure     Time spent: 45 minutes  Author: Jerline Moon, MD 11/01/2023 8:40 AM  For on call review [email or web address].

## 2023-11-01 NOTE — TOC Progression Note (Signed)
 Transition of Care [facility]) - Progression Note    Patient Details  Name: Jonathan Page MRN: [id number] Date of Birth: September 29, 1950  [facility] CM/SW Contact  Tessie Fila, RN Phone Number: 11/01/2023, 3:37 PM  Clinical Narrative:    [facility] consulted by Palliative MD. [name] with ACC states she will evaluate pt in the am. Pt and family have wishes for the pt to transfer to residential hospice at [facility]. TOC following.   Expected Discharge Plan: Home w Hospice Care Barriers to Discharge: Continued Medical Work up  Expected Discharge Plan and Services In-house Referral: Clinical Social Work   Post Acute Care Choice: NA Living arrangements for the past 2 months: Skilled Nursing Facility [facility])                 DME Arranged: N/A DME Agency: NA       HH Arranged: NA           Social Determinants of Health (SDOH) Interventions SDOH Screenings   Food Insecurity: No Food Insecurity (10/31/2023)  Housing: Low Risk  (10/31/2023)  Transportation Needs: No Transportation Needs (10/31/2023)  Utilities: Not At Risk (10/31/2023)  Depression (PHQ2-9): Low Risk  (03/16/2023)  Social Connections: Unknown (10/31/2023)  Tobacco Use: Medium Risk (10/30/2023)    Readmission Risk Interventions    10/31/2023   10:28 AM  Readmission Risk Prevention Plan  Transportation Screening Complete  Medication Review (RN Care Manager) Complete  PCP or Specialist appointment within 3-5 days of discharge Complete  HRI or Home Care Consult Complete  SW Recovery Care/Counseling Consult Complete  Palliative Care Screening Not Complete  Comments TBD  Skilled Nursing Facility Not Complete  SNF Comments TBD

## 2023-11-02 ENCOUNTER — Inpatient Hospital Stay

## 2023-11-02 DIAGNOSIS — J939 Pneumothorax, unspecified: Secondary | ICD-10-CM | POA: Diagnosis not present

## 2023-11-02 DIAGNOSIS — J910 Malignant pleural effusion: Secondary | ICD-10-CM | POA: Diagnosis not present

## 2023-11-02 DIAGNOSIS — N179 Acute kidney failure, unspecified: Secondary | ICD-10-CM | POA: Diagnosis not present

## 2023-11-02 DIAGNOSIS — Z711 Person with feared health complaint in whom no diagnosis is made: Secondary | ICD-10-CM

## 2023-11-02 DIAGNOSIS — D696 Thrombocytopenia, unspecified: Secondary | ICD-10-CM | POA: Diagnosis not present

## 2023-11-02 DIAGNOSIS — C3490 Malignant neoplasm of unspecified part of unspecified bronchus or lung: Secondary | ICD-10-CM | POA: Diagnosis not present

## 2023-11-02 DIAGNOSIS — Z7189 Other specified counseling: Secondary | ICD-10-CM

## 2023-11-02 DIAGNOSIS — Z515 Encounter for palliative care: Secondary | ICD-10-CM | POA: Diagnosis not present

## 2023-11-02 DIAGNOSIS — J9601 Acute respiratory failure with hypoxia: Secondary | ICD-10-CM | POA: Diagnosis not present

## 2023-11-02 DIAGNOSIS — Z66 Do not resuscitate: Secondary | ICD-10-CM | POA: Diagnosis not present

## 2023-11-02 DIAGNOSIS — Z79899 Other long term (current) drug therapy: Secondary | ICD-10-CM

## 2023-11-02 LAB — TYPE AND SCREEN
ABO/RH(D): O POS
Antibody Screen: POSITIVE
Donor AG Type: NEGATIVE
Donor AG Type: NEGATIVE
Unit division: 0
Unit division: 0

## 2023-11-02 LAB — BPAM RBC
Blood Product Expiration Date: 202506192359
Blood Product Expiration Date: 202506242359
ISSUE DATE / TIME: 202505201342
Unit Type and Rh: 5100
Unit Type and Rh: 5100

## 2023-11-02 LAB — CYTOLOGY - NON PAP

## 2023-11-02 MED ORDER — POLYVINYL ALCOHOL 1.4 % OP SOLN: 1.0000 [drp] | Freq: Four times a day (QID) | OPHTHALMIC | Status: DC | PRN

## 2023-11-02 MED ORDER — HYDROMORPHONE HCL 1 MG/ML IJ SOLN
0.5000 mg | INTRAMUSCULAR | Status: DC | PRN
  Administered 2023-11-02: 0.5 mg via INTRAVENOUS
  Administered 2023-11-04 – 2023-11-06 (×6): 1 mg via INTRAVENOUS
  Filled 2023-11-02 (×7): qty 1

## 2023-11-02 MED ORDER — LORAZEPAM 2 MG/ML PO CONC
0.5000 mg | ORAL | Status: DC | PRN
  Filled 2023-11-02: qty 0.25

## 2023-11-02 MED ORDER — GLYCOPYRROLATE 0.2 MG/ML IJ SOLN: 0.2000 mg | INTRAMUSCULAR | Status: DC | PRN

## 2023-11-02 MED ORDER — HYDROMORPHONE HCL 1 MG/ML IJ SOLN
INTRAMUSCULAR | Status: AC
  Filled 2023-11-02: qty 1

## 2023-11-02 MED ORDER — BIOTENE DRY MOUTH MT LIQD: 15.0000 mL | OROMUCOSAL | Status: DC | PRN

## 2023-11-02 MED ORDER — GLYCOPYRROLATE 1 MG PO TABS: 1.0000 mg | ORAL_TABLET | ORAL | Status: DC | PRN

## 2023-11-02 MED ORDER — LORAZEPAM 2 MG/ML IJ SOLN: 0.5000 mg | INTRAMUSCULAR | Status: DC | PRN

## 2023-11-02 MED ORDER — LORAZEPAM 0.5 MG PO TABS
0.5000 mg | ORAL_TABLET | ORAL | Status: DC | PRN
  Administered 2023-11-13 – 2023-11-16 (×5): 0.5 mg via ORAL
  Filled 2023-11-02 (×5): qty 1

## 2023-11-02 NOTE — Plan of Care (Signed)
  Problem: Health Behavior/Discharge Planning: Goal: Ability to identify and utilize available resources and services will improve Outcome: Progressing

## 2023-11-02 NOTE — Plan of Care (Signed)
  Problem: Education: Goal: Ability to describe self-care measures that may prevent or decrease complications (Diabetes Survival Skills Education) will improve Outcome: Not Progressing Goal: Individualized Educational Video(s) Outcome: Not Progressing   Problem: Coping: Goal: Ability to adjust to condition or change in health will improve Outcome: Not Progressing   Problem: Fluid Volume: Goal: Ability to maintain a balanced intake and output will improve Outcome: Not Progressing   Problem: Health Behavior/Discharge Planning: Goal: Ability to identify and utilize available resources and services will improve Outcome: Not Progressing

## 2023-11-02 NOTE — Progress Notes (Signed)
 [facility] [employer]  Hospice hospital liaison note   Referral received from [facility] for family interest in [name]. Eligibility confirmed. Met with patient and family to confirm interest and explain services   Unfortunately [facility] is unable to offer a bed today.   TOC aware that liaison will follow up tomorrow or sooner if a bed becomes available.    Thank you for the opportunity to participate in this patient's care Ardine Beckwith, LPN Hospice nurse liaison [phone]

## 2023-11-02 NOTE — Progress Notes (Signed)
 Daily Progress Note   Patient Name: Jonathan Page       Date: 11/02/2023 DOB: March 08, 1951  Age: 73 y.o. MRN#: [id number] Attending Physician: Lonita Roach, MD Primary Care Physician: [name], NP Admit Date: 10/30/2023 Length of Stay: 3 days  Reason for Consultation/Follow-up: Establishing goals of care  Subjective:   CC: Patient is noting worsening pain today.  Following up regarding complex medical decision making.  Subjective:  Reviewed EMR prior to presenting to bedside.  At time of EMR review in past 24 hours patient has required as needed IV fentanyl  25 mcg x 6 doses.   Patient had severe worsening blood pressure overnight.  Review of vital signs showing tachycardia with hypotension with SBP in 80-90s.  Was down to 74/34 yesterday evening.  Overnight provider able to reach out to family who confirmed transition to full comfort focused care.  Reviewed hospitalist provider documentation from yesterday evening and today. Patient's family met with [facility] provider this morning to discuss inpatient hospice.  Presented to bedside to see patient.  Patient's 2 sons were present at bedside.  Patient initially resting though easily awakened.  Patient notes that he is having worsening pain.  Patient feels that the as needed fentanyl  dose is no longer appropriately managing it.  Discussed adjusting medications for his full comfort.  Patient and family agreeing with this plan.  Spent time answering questions as able.  Noted palliative medicine team will continue to follow with patient's medical journey.  Discussed care with team including hospitalist, RN, TOC, and ACC liaison to coordinate care.  Objective:   Vital Signs:  BP (!) 96/54   Pulse (!) 101   Temp 98.7 F (37.1 C) (Oral)   Resp 14   Ht 6\' 1"  (1.854 m)   SpO2 92%   BMI 18.47 kg/m   Physical Exam: General: NAD, alert, frail, chronically ill appearing  Cardiovascular: Tachycardia noted Respiratory: no  increased work of breathing noted, not in respiratory distress Neuro: Will awaken though overall tired Psych: appropriately answers all questions  Imaging:  I personally reviewed recent imaging.   Assessment & Plan:   Assessment: Palliative Care consult requested for goals of care discussion in this 73 y.o. male  with past medical history of non-small cell lung cancer (05/2024) with metastatic disease to spine and widespread bone on second line treatment of ramucirumab  and doxetal due to progress, recent subarachnoid hemorrhage s/p fall, diabetes, hypertension, and seasonal allergies. He was admitted on 10/30/2023 with weakness, increased shortness of breath. CT showed bilateral pleural effusion, worsening mets, and enlarged left upper lobe mass. Palliative consulted for goals of care discussions.    Recommendations/Plan: # Complex medical decision making/goals of care:  - Discussed care with patient and patient's 2 sons as detailed above in HPI.  At this time g patient is full comfort focused care.  -At this time we will discontinue interventions that are no longer focused on comfort such as IV fluids, imaging, or lab work.  Will instead focus on symptom management of pain, dyspnea, and agitation in the setting of end-of-life care.    Code Status: Do not attempt resuscitation (DNR) - Comfort care     # Symptom management: Patient is receiving these palliative interventions for symptom management with an intent to improve quality of life.   -Pain, severe in setting of metastatic NSCLC    -Start IV hydromorphone  0.5-2 mg every 30 minutes as needed.  Continue to adjust based on patient's symptom burden.  If patient needing frequent doses, could consider continuous infusion.   - Discontinue fentanyl     -Continue gabapentin  300mg  at bedtime   -Nausea   -Continue prochlorperazine  5mg  q6hrs as needed    -Constipation    -Continue Senna 1 tab daily as needed      -Anxiety/agitation, in the  setting of end-of-life care                               -Start Ativan  0.5 mg every 4 hours as needed. Continue to adjust based on patient's symptom burden.     -Secretions, in the setting of end-of-life care                               -Start glycopyrrolate  every 4 hours as needed.   # Psychosocial Support:  -significant other, sons   # Discharge Planning: To Be Determined  - TOC and ACC liaison involved with discharge coordination for possible inpatient hospice.   Discussed with: Patient, patient's 2 sons, hospitalist, RN, TOC, ACC liaison Thank you for allowing the palliative care team to participate in the care [name].  Barnett Libel, DO Palliative Care Provider PMT # [phone]  If patient remains symptomatic despite maximum doses, please call PMT at [phone] between 0700 and 1900. Outside of these hours, please call attending, as PMT does not have night coverage.

## 2023-11-02 NOTE — TOC Progression Note (Signed)
 Transition of Care [facility]) - Progression Note    Patient Details  Name: Jonathan Page MRN: [id number] Date of Birth: [birth year]  Transition of Care [facility]) CM/SW Contact  Amaryllis Junior, [place] Phone Number: 11/02/2023, 1:40 PM  Clinical Narrative:    Pt accepted at [facility]. Awaiting bed to become available. Once bed available, TOC will assist in coordinating transportation.    Expected Discharge Plan: Home w Hospice Care Barriers to Discharge: Continued Medical Work up  Expected Discharge Plan and Services In-house Referral: Clinical Social Work   Post Acute Care Choice: NA Living arrangements for the past 2 months: Skilled Nursing Facility [facility])                 DME Arranged: N/A DME Agency: NA       HH Arranged: NA           Social Determinants of Health (SDOH) Interventions SDOH Screenings   Food Insecurity: No Food Insecurity (10/31/2023)  Housing: Low Risk  (10/31/2023)  Transportation Needs: No Transportation Needs (10/31/2023)  Utilities: Not At Risk (10/31/2023)  Depression (PHQ2-9): Low Risk  (03/16/2023)  Social Connections: Unknown (10/31/2023)  Tobacco Use: Medium Risk (10/30/2023)    Readmission Risk Interventions    10/31/2023   10:28 AM  Readmission Risk Prevention Plan  Transportation Screening Complete  Medication Review (RN Care Manager) Complete  PCP or Specialist appointment within 3-5 days of discharge Complete  HRI or Home Care Consult Complete  SW Recovery Care/Counseling Consult Complete  Palliative Care Screening Not Complete  Comments TBD  Skilled Nursing Facility Not Complete  SNF Comments TBD

## 2023-11-02 NOTE — Progress Notes (Signed)
 Progress Note   Patient: Jonathan Page UXL:[id number] DOB: [birth year] DOA: 10/30/2023     3 DOS: the patient was seen and examined on 11/02/2023   Brief hospital course: 73 year old man PMH non-small cell lung cancer metastatic to bone, progressive despite second line therapy, presented with shortness of breath.  CT showed progression of disease, patient admitted for new hypoxia with respiratory failure, further evaluation.  Treated with antibiotics, underwent thoracentesis with small postprocedure pneumothorax which is stable.  Pulmonology has recommended conservative management.  Patient appears to be asymptomatic.  Seen by oncology with recommendation for no further chemotherapy.  Seen by palliative care, considering hospice.  Continuing current care pending further discussions with family.  Consultants Oncology  Palliative medicine  Procedures/Events 5/20 left thoracentesis   Assessment and Plan: * Acute respiratory failure with hypoxia (HCC) Malignant left pleural effusion Right pleural effusion Non-small cell lung cancer metastatic to bone (HCC) Small left-sided pneumothorax status postthoracentesis Admitted for acute hypoxic respiratory failure, now on oxygen.  Plan pulm on oxygen.  Likely secondary to progression of disease but infection could not be ruled out.  Treated empirically with antibiotics. CT no PE.  Enlarging left upper lobe mass with worsening spread, increasing size and number of pulmonary metastases and nodules, enlarging bilateral pleural effusions, right upper lobe airspace disease pneumonia versus lymphangitic spread, diffuse osseous metastatic disease. Small pneumothorax status postthoracentesis.  Pulmonology recommended conservative management if repeat chest x-ray stable.  Repeat chest x-ray showed stable left apical and lateral hydropneumothorax, no evidence of tension physiology.  Continue conservative management. Per oncology not a candidate for further  oncologic intervention. Now comfort care     Iron  deficiency anemia Thrombocytopenia (HCC) Anemia likely multifactorial secondary to malignancy, recent chemotherapy, poor nutritional status  Platelets unchanged 112K Hemoglobin up to 7.9 status post 1 unit PRBC as appropriate. Now comfort care   AKI (acute kidney injury) (HCC) NAGMA Cr 1.7 on admission from baseline 1.3.  Improved with fluids. No comfort care, no further monitoring.   Uncontrolled type 2 diabetes mellitus with hyperglycemia, without long-term current use of insulin  (HCC) Neuropathy Hypoglycemia Glucose low normal.  Hypoglycemia resolved. Holding glipizide , insulin . Continue gabapentin .   Hypotension Tachycardia from advanced pulmonary cancer.  No signs or symptoms of sepsis. Terminal illness   PMH SAH one month ago   Disposition From: [facility] To: Residential hospice      Subjective:  Overnight events noted.  Patient had low blood pressure and after night coverage discussed with significant other and family, decision was made to proceed with comfort care.  This morning the patient seems to feel well and has no complaints.  Physical Exam: Vitals:   11/02/23 0000 11/02/23 0100 11/02/23 0200 11/02/23 0300  BP: 104/62 (!) 102/55 (!) 106/53 (!) 96/54  Pulse: (!) 101 (!) 101 (!) 102 (!) 101  Resp: 15 (!) 21 16 14   Temp:      TempSrc:      SpO2: 93% 90% 92% 92%  Height:       Physical Exam Vitals reviewed.  Constitutional:      General: He is not in acute distress.    Appearance: He is not ill-appearing or toxic-appearing.  Cardiovascular:     Rate and Rhythm: Normal rate and regular rhythm.     Heart sounds: No murmur heard. Pulmonary:     Effort: Pulmonary effort is normal. No respiratory distress.     Breath sounds: No wheezing, rhonchi or rales.  Musculoskeletal:  Right lower leg: Edema present.     Left lower leg: Edema present.  Neurological:     Mental  Status: He is alert.  Psychiatric:        Mood and Affect: Mood normal.        Behavior: Behavior normal.     Data Reviewed: No further data  Family Communication:   Disposition: Status is: Inpatient Remains inpatient appropriate because: planning hospice     Time spent: 25 minutes  Author: Jerline Moon, MD 11/02/2023 9:23 AM  For on call review [email or web address].

## 2023-11-02 NOTE — Discharge Summary (Signed)
 Physician Discharge Summary   Patient: Jonathan Page MRN: [id number] DOB: 1951/01/03  Admit date:     10/30/2023  Discharge date: 11/02/23  Discharge Physician: Jerline Moon   PCP: [name], NP   Recommendations at discharge:   Discharged to residential hospice  Discharge Diagnoses: Principal Problem:   Acute respiratory failure with hypoxia [facility]) Active Problems:   Pleural effusion, bilateral   Iron  deficiency anemia   Essential hypertension   Non-small cell lung cancer metastatic to bone (HCC)   Thrombocytopenia (HCC)   Uncontrolled type 2 diabetes mellitus with hyperglycemia, without long-term current use of insulin  (HCC)   AKI (acute kidney injury) (HCC)   Hypoglycemia   Pneumothorax   Pressure injury of skin   Malignant pleural effusion   DNR (do not resuscitate)   Cancer associated pain   Need for emotional support   Counseling and coordination of care   Acute hypoxic respiratory failure (HCC)   Palliative care encounter   High risk medication use   Concern about end of life   Medication management   Goals of care, counseling/discussion  Resolved Problems:   * No resolved hospital problems. *  Hospital Course: 73 year old man PMH non-small cell lung cancer metastatic to bone, progressive despite second line therapy, presented with shortness of breath.  CT showed progression of disease, patient admitted for new hypoxia with respiratory failure, further evaluation.  Treated with antibiotics, underwent thoracentesis with small postprocedure pneumothorax which stabilized.  Pulmonology recommended conservative management. Seen by oncology with recommendation for no further chemotherapy.  Seen by palliative care.  After further discussion, hospice was entertained.  Patient subsequently declined with low blood pressure and after further discussion was made full comfort care.  At this time plan transfer to residential hospice.  Consultants Oncology   Palliative medicine  Procedures/Events 5/20 left thoracentesis   * Acute respiratory failure with hypoxia (HCC) Malignant left pleural effusion Right pleural effusion Non-small cell lung cancer metastatic to bone (HCC) Small left-sided pneumothorax status postthoracentesis Admitted for acute hypoxic respiratory failure, now on oxygen.  Likely secondary to progression of disease but infection could not be ruled out.  Treated empirically with antibiotics. CT no PE.  Enlarging left upper lobe mass with worsening spread, increasing size and number of pulmonary metastases and nodules, enlarging bilateral pleural effusions, right upper lobe airspace disease pneumonia versus lymphangitic spread, diffuse osseous metastatic disease. Small pneumothorax status postthoracentesis.  Pulmonology recommended conservative management if repeat chest x-ray stable.  Repeat chest x-ray showed stable left apical and lateral hydropneumothorax, no evidence of tension physiology.  Continue conservative management. Per oncology not a candidate for further oncologic intervention. Now comfort care     Iron  deficiency anemia Thrombocytopenia (HCC) Anemia likely multifactorial secondary to malignancy, recent chemotherapy, poor nutritional status  Platelets stable on last check Hemoglobin up status post 1 unit PRBC as appropriate. Now comfort care   AKI (acute kidney injury) (HCC) NAGMA Cr 1.7 on admission from baseline 1.3.  Improved with fluids. Now comfort care, no further monitoring.   Uncontrolled type 2 diabetes mellitus with hyperglycemia, without long-term current use of insulin  (HCC) Neuropathy Hypoglycemia Glucose low normal.  Hypoglycemia resolved. Holding glipizide , insulin . Continue gabapentin .   Hypotension Tachycardia from advanced pulmonary cancer.  No signs or symptoms of sepsis. Terminal illness   PMH SAH one month ago   Disposition From: [facility] To:  Residential hospice       Disposition: Hospice care Diet recommendation:  Regular diet DISCHARGE  MEDICATION: Allergies as of 11/02/2023   Not on File      Medication List     STOP taking these medications    Accu-Chek Aviva Plus test strip Generic drug: glucose blood   Accu-Chek Aviva Plus w/Device Kit   accu-chek soft touch lancets   Blood Glucose Monitoring Suppl Devi   dexamethasone  4 MG tablet Commonly known as: DECADRON    diclofenac  Sodium 1 % Gel Commonly known as: VOLTAREN    docusate sodium  100 MG capsule Commonly known as: COLACE   feeding supplement Liqd   folic acid  1 MG tablet Commonly known as: FOLVITE    glipiZIDE  10 MG 24 hr tablet Commonly known as: GLUCOTROL  XL   insulin  lispro 100 UNIT/ML injection Commonly known as: HUMALOG   insulin  NPH-regular Human (70-30) 100 UNIT/ML injection   lidocaine -prilocaine  cream Commonly known as: EMLA    lisinopril  10 MG tablet Commonly known as: ZESTRIL    methocarbamol  500 MG tablet Commonly known as: ROBAXIN    ondansetron  8 MG tablet Commonly known as: ZOFRAN    oxyCODONE  5 MG immediate release tablet Commonly known as: Oxy IR/ROXICODONE    Oxycodone  HCl 10 MG Tabs   prochlorperazine  10 MG tablet Commonly known as: COMPAZINE        TAKE these medications    gabapentin  300 MG capsule Commonly known as: NEURONTIN  Take 1 capsule (300 mg total) by mouth at bedtime. What changed: when to take this         Discharge Exam: There were no vitals filed for this visit. See progress note same day  Condition at discharge: terminal  The results of significant diagnostics from this hospitalization (including imaging, microbiology, ancillary and laboratory) are listed below for reference.   Imaging Studies: DG CHEST PORT 1 VIEW Result Date: 11/01/2023 CLINICAL DATA:  Pneumothorax. EXAM: PORTABLE CHEST 1 VIEW COMPARISON:  Oct 31, 2023. FINDINGS: Stable cardiomediastinal silhouette. Right  internal jugular Port-A-Cath is unchanged. Stable bilateral opacities are noted concerning for multifocal pneumonia. Small left pleural effusion is noted. Stable possible minimal left apical and basilar pneumothorax. Bony thorax is unremarkable. IMPRESSION: Stable bilateral opacities concerning for multifocal pneumonia. Small left pleural effusion. Stable minimal left-sided pneumothorax. Electronically Signed   By: Rosalene Colon M.D.   On: 11/01/2023 12:00   DG CHEST PORT 1 VIEW Result Date: 10/31/2023 CLINICAL DATA:  Pneumothorax EXAM: PORTABLE CHEST 1 VIEW COMPARISON:  10/31/2023 FINDINGS: Small left apical and lateral hydropneumothorax demonstrating a small fluid component is stable. No radiographic evidence of tension physiology. No pneumothorax lumbar right. Small right pleural effusion is stable. Extensive multifocal airspace infiltrate, more severe within the right upper lung zone and perihilar regions, is unchanged in keeping with multifocal infection. Right internal jugular chest port tip is unchanged within the right atrium. Cardiac size within normal limits. IMPRESSION: 1. Stable small left apical and lateral hydropneumothorax. No radiographic evidence of tension physiology. 2. Stable multifocal pulmonary infiltrate. 3. Stable small right pleural effusion and a Electronically Signed   By: Worthy Heads M.D.   On: 10/31/2023 20:25   US  THORACENTESIS ASP PLEURAL SPACE W/IMG GUIDE Result Date: 10/31/2023 INDICATION: 73 year old male with metastatic non-small-cell lung cancer, with new onset shortness of breath, and bilateral pleural effusion, left greater than right. IR was requested for diagnostic and therapeutic thoracentesis. EXAM: ULTRASOUND GUIDED DIAGNOSTIC AND THERAPEUTIC THORACENTESIS MEDICATIONS: 6 cc of 1% lidocaine  COMPLICATIONS: None immediate. PROCEDURE: An ultrasound guided thoracentesis was thoroughly discussed with the patient and questions answered. The benefits, risks, alternatives  and complications were also discussed.  The patient understands and wishes to proceed with the procedure. Written consent was obtained. Ultrasound was performed to localize and mark an adequate pocket of fluid in the left chest. The area was then prepped and draped in the normal sterile fashion. 1% Lidocaine  was used for local anesthesia. Under ultrasound guidance a 6 Fr Safe-T-Centesis catheter was introduced. Thoracentesis was performed. The catheter was removed and a dressing applied. FINDINGS: A total of approximately 1.10 L of clear, straw-colored pleural fluid was removed. Samples were sent to the laboratory as requested by the clinical team. IMPRESSION: Successful ultrasound guided left thoracentesis yielding 1.1 L of pleural fluid. Procedure performed by Lambert Pillion, PA-C Carim, PA-C Electronically Signed   By: Creasie Doctor M.D.   On: 10/31/2023 15:53   DG Chest 1 View Result Date: 10/31/2023 CLINICAL DATA:  Status post thoracentesis EXAM: CHEST  1 VIEW COMPARISON:  Chest x-ray 10/29/2023. FINDINGS: There is a small left-sided pneumothorax which is new from prior. There is a small left pleural effusion which has significantly decreased. Bilateral airspace disease, right greater than left, has not significantly changed. Cardiomediastinal silhouette is within normal limits. Right chest port catheter tip projects over the distal SVC. No acute fractures are identified IMPRESSION: 1. New small left-sided pneumothorax. 2. Small left pleural effusion which has significantly decreased. 3. Stable bilateral airspace disease. Electronically Signed   By: Tyron Gallon M.D.   On: 10/31/2023 15:10   CT Angio Chest Pulmonary Embolism (PE) W or WO Contrast Result Date: 10/31/2023 CLINICAL DATA:  Shortness of breath, low O2 sats. EXAM: CT ANGIOGRAPHY CHEST WITH CONTRAST TECHNIQUE: Multidetector CT imaging of the chest was performed using the standard protocol during bolus administration of intravenous contrast.  Multiplanar CT image reconstructions and MIPs were obtained to evaluate the vascular anatomy. RADIATION DOSE REDUCTION: This exam was performed according to the departmental dose-optimization program which includes automated exposure control, adjustment of the mA and/or kV according to patient size and/or use of iterative reconstruction technique. CONTRAST:  60mL OMNIPAQUE  IOHEXOL  350 MG/ML SOLN COMPARISON:  Chest CT 09/11/2023. FINDINGS: Cardiovascular: No filling defects in the pulmonary arteries to suggest pulmonary emboli. Heart is normal size. Aorta is normal caliber. Right Port-A-Cath remains in place, unchanged. Mediastinum/Nodes: Infiltrating tumor invading the left side of the mediastinum and into the left AP window, progressed since prior study. Worsening mediastinal and bilateral hilar adenopathy. No visible axillary adenopathy. Lungs/Pleura: Large loculated left pleural effusion and moderate right pleural effusion, increasing since prior study. Increasing size and number of metastases throughout the lungs. Central left upper lobe mass now measures 4.8 x 4.1 cm compared to 4.3 x 3.6 cm. Progressive tumor extension and endobronchial spread throughout the left upper lobe with confluent soft tissue thickening again noted along the peribronchovascular bundle, worsening since prior study. Extensive airspace disease throughout the right upper lobe could reflect pneumonia or lymphangitic spread of tumor. Upper Abdomen: No acute findings Musculoskeletal: Soft tissue nodule superficial to xiphoid process measures 2.6 cm, stable. Diffuse osseous metastatic disease again noted, likely slightly progressed since prior study. Review of the MIP images confirms the above findings. IMPRESSION: No evidence of pulmonary embolus. Enlarging left upper lobe mass with worsening spread a along the peribronchovascular bundle in the left upper lobe. Increasing size and number of pulmonary metastases/nodules. Worsening mediastinal  and bilateral hilar adenopathy. Enlarging bilateral pleural effusions, left larger than right. Airspace disease throughout the right upper lobe could reflect pneumonia or lymphangitic spread of tumor. Diffuse osseous metastatic disease, likely progressed since  prior study. Electronically Signed   By: Janeece Mechanic M.D.   On: 10/31/2023 00:11   DG Chest Port 1 View Result Date: 10/30/2023 CLINICAL DATA:  Shortness of breath EXAM: PORTABLE CHEST 1 VIEW COMPARISON:  10/05/2023, CT 09/11/2023 FINDINGS: Right-sided central venous port tip at the cavoatrial junction. Small right-sided pleural effusion, small moderate probably loculated left pleural effusion, increased compared to prior radiograph. Distortion and irregular density in the left upper lobe, perihilar region appears more dense radiographically compared to prior. Persistent dense left lung base consolidation. New or worsened diffuse right-sided pulmonary infiltrates or edema. No pneumothorax IMPRESSION: 1. Small right-sided pleural effusion, small to moderate probably loculated left pleural effusion, increased compared to prior radiograph. 2. New or worsened diffuse right-sided pulmonary infiltrates or edema. 3. Distortion and irregular density in the left upper lobe, perihilar region appears more dense radiographically compared to prior and corresponds to known history of malignancy. Persistent dense left lung base consolidation. Electronically Signed   By: Esmeralda Hedge M.D.   On: 10/30/2023 20:42   DG Chest Port 1 View Result Date: 10/05/2023 CLINICAL DATA:  Chest pain EXAM: PORTABLE CHEST 1 VIEW COMPARISON:  October 02, 2023 FINDINGS: No change in the right IJ Infuse-A-Port catheter Persistent improving left lung pneumonia infiltrates. With some infiltrates of the right lung as well without consolidations. Small left effusion Heart normal size IMPRESSION: Improving left lung pneumonia. Electronically Signed   By: Fredrich Jefferson M.D.   On: 10/05/2023 13:31     Microbiology: Results for orders placed or performed during the hospital encounter of 10/30/23  Blood culture (routine x 2)     Status: None (Preliminary result)   Collection Time: 10/30/23  8:40 PM   Specimen: BLOOD  Result Value Ref Range Status   Specimen Description   Final    BLOOD RIGHT PORTA CATH Performed at [facility], [address] [place], [place] [address]    Special Requests   Final    BOTTLES DRAWN AEROBIC AND ANAEROBIC Blood Culture adequate volume Performed at [facility], [address], [place], [place] [address]    Culture   Final    NO GROWTH 3 DAYS Performed at [facility], [address] [place], [place] [address]    Report Status PENDING  Incomplete  Blood culture (routine x 2)     Status: None (Preliminary result)   Collection Time: 10/30/23  8:50 PM   Specimen: BLOOD  Result Value Ref Range Status   Specimen Description   Final    BLOOD BLOOD RIGHT FOREARM Performed at [facility], [address], [place], [place] [address]    Special Requests   Final    BOTTLES DRAWN AEROBIC AND ANAEROBIC Blood Culture adequate volume Performed at [facility], [address], [place], [place] [address]    Culture   Final    NO GROWTH 3 DAYS Performed at [facility], [address], [place], [place] [address]    Report Status PENDING  Incomplete  MRSA Next Gen by PCR, Nasal     Status: Abnormal   Collection Time: 10/31/23  6:14 AM   Specimen: Nasal Mucosa; Nasal Swab  Result Value Ref Range Status   MRSA by PCR Next Gen DETECTED (A) NOT DETECTED Final    Comment: (NOTE) The GeneXpert MRSA Assay (FDA approved for NASAL specimens only), is one component of a comprehensive MRSA colonization surveillance program. It is not intended  to diagnose MRSA infection nor to guide or monitor treatment for MRSA infections. Test performance is not FDA approved in patients less  than 36 years old. Performed at [facility], [address] [place], [place] [address]   Body fluid culture w Gram Stain     Status: None (Preliminary result)   Collection Time: 10/31/23 12:08 PM   Specimen: PATH Cytology Pleural fluid  Result Value Ref Range Status   Specimen Description   Final    PLEURAL Performed at [facility], [address], [place], [place] [address]    Special Requests   Final    NONE Performed at [facility], [address] [place], [place] [address]    Gram Stain   Final    MODERATE WBC PRESENT, PREDOMINANTLY MONONUCLEAR NO ORGANISMS SEEN    Culture   Final    NO GROWTH 2 DAYS Performed at [facility], [address], [place], [place] [address]    Report Status PENDING  Incomplete    Labs: CBC: Recent Labs  Lab 10/30/23 1945 10/31/23 0310 11/01/23 0416  WBC 14.3* 11.7* 10.3  NEUTROABS 12.1*  --   --   HGB 8.0* 7.1* 7.9*  HCT 26.5* 24.2* 24.8*  MCV 112.8* 114.2* 105.5*  PLT 128* 112* 112*   Basic Metabolic Panel: Recent Labs  Lab 10/30/23 1945 10/31/23 0310 11/01/23 0416  NA 142 136 134*  K 4.3 4.1 4.1  CL 110 108 109  CO2 19* 18* 19*  GLUCOSE 49* 118* 157*  BUN 57* 53* 47*  CREATININE 1.77* 1.66* 1.59*  CALCIUM  9.0 8.8* 8.8*  MG  --  2.2  --    Liver Function Tests: Recent Labs  Lab 10/30/23 1945 10/31/23 0310  AST 14* 12*  ALT 9 9  ALKPHOS 164* 151*  BILITOT 0.7 1.0  PROT 6.0* 5.7*  ALBUMIN 2.8* 2.6*   CBG: Recent Labs  Lab 10/31/23 1611 10/31/23 2102 11/01/23 0749 11/01/23 1133 11/01/23 1610  GLUCAP 239* 222* 174* 165* 184*    Discharge time spent: less than 30 minutes.  Signed: Jerline Moon, MD [facility] 11/02/2023

## 2023-11-03 DIAGNOSIS — Z515 Encounter for palliative care: Secondary | ICD-10-CM | POA: Diagnosis not present

## 2023-11-03 DIAGNOSIS — Z7189 Other specified counseling: Secondary | ICD-10-CM | POA: Diagnosis not present

## 2023-11-03 DIAGNOSIS — C3490 Malignant neoplasm of unspecified part of unspecified bronchus or lung: Secondary | ICD-10-CM | POA: Diagnosis not present

## 2023-11-03 DIAGNOSIS — D696 Thrombocytopenia, unspecified: Secondary | ICD-10-CM | POA: Diagnosis not present

## 2023-11-03 DIAGNOSIS — J9601 Acute respiratory failure with hypoxia: Secondary | ICD-10-CM | POA: Diagnosis not present

## 2023-11-03 DIAGNOSIS — J910 Malignant pleural effusion: Secondary | ICD-10-CM | POA: Diagnosis not present

## 2023-11-03 DIAGNOSIS — Z79899 Other long term (current) drug therapy: Secondary | ICD-10-CM | POA: Diagnosis not present

## 2023-11-03 LAB — CHOLESTEROL, BODY FLUID: Cholesterol, Fluid: 33 mg/dL

## 2023-11-03 NOTE — Progress Notes (Addendum)
 Progress Note   Patient: Jonathan Page:[id number] DOB: 1950-09-16 DOA: 10/30/2023     4 DOS: the patient was seen and examined on 11/03/2023   Brief hospital course: 73 year old man PMH non-small cell lung cancer metastatic to bone, progressive despite second line therapy, presented with shortness of breath.  CT showed progression of disease, patient admitted for new hypoxia with respiratory failure, further evaluation.  Treated with antibiotics, underwent thoracentesis with small postprocedure pneumothorax which is stable.  Pulmonology recommended conservative management.  Seen by oncology with recommendation for no further chemotherapy.  Seen by palliative care for ultimately patient elected comfort care.  Consultants Oncology  Palliative medicine  Procedures/Events 5/20 left thoracentesis   Assessment and Plan: * Acute respiratory failure with hypoxia (HCC) Malignant left pleural effusion Right pleural effusion Non-small cell lung cancer metastatic to bone (HCC) Small left-sided pneumothorax status postthoracentesis Admitted for acute hypoxic respiratory failure, now on oxygen.  Plan pulm on oxygen.  Likely secondary to progression of disease but infection could not be ruled out.  Treated empirically with antibiotics. CT no PE.  Enlarging left upper lobe mass with worsening spread, increasing size and number of pulmonary metastases and nodules, enlarging bilateral pleural effusions, right upper lobe airspace disease pneumonia versus lymphangitic spread, diffuse osseous metastatic disease. Small pneumothorax status postthoracentesis.  Pulmonology recommended conservative management if repeat chest x-ray stable.  Repeat chest x-ray showed stable left apical and lateral hydropneumothorax, no evidence of tension physiology.  Continue conservative management. Per oncology not a candidate for further oncologic intervention. Comfort care     Iron  deficiency anemia Thrombocytopenia  (HCC) Anemia likely multifactorial secondary to malignancy, recent chemotherapy, poor nutritional status  Platelets unchanged 112K last check Hemoglobin up to 7.9 status post 1 unit PRBC as appropriate. Comfort care   AKI (acute kidney injury) (HCC) NAGMA Cr 1.7 on admission from baseline 1.3.  Improved with fluids. Comfort care, no further monitoring.   Uncontrolled type 2 diabetes mellitus with hyperglycemia, without long-term current use of insulin  (HCC) Neuropathy Hypoglycemia Glucose low normal.  Hypoglycemia resolved. Holding glipizide , insulin . Continue gabapentin .   Hypotension Tachycardia from advanced pulmonary cancer.  No signs or symptoms of sepsis. Terminal illness   PMH SAH one month ago  Pressure injury to sacrum stage 3, present on admission   Underweight  Body mass index is 18.47 kg/m.    Disposition From: [facility] To: Residential hospice      Subjective:  Feels ok  Physical Exam: Vitals:   11/02/23 1556 11/02/23 2117 11/03/23 0507 11/03/23 1339  BP: 99/60 100/66 115/67 106/70  Pulse: 98 100 96 (!) 105  Resp:  14 16 16   Temp:  97.6 F (36.4 C) 97.6 F (36.4 C) (!) 97.4 F (36.3 C)  TempSrc:  Oral Oral Oral  SpO2: 99% 99% 99% 97%  Height:       Physical Exam Vitals reviewed.  Constitutional:      General: He is not in acute distress.    Appearance: He is not ill-appearing or toxic-appearing.  Cardiovascular:     Rate and Rhythm: Normal rate and regular rhythm.     Heart sounds: No murmur heard. Pulmonary:     Effort: Pulmonary effort is normal. No respiratory distress.     Breath sounds: No wheezing, rhonchi or rales.  Neurological:     Mental Status: He is alert.  Psychiatric:        Mood and Affect: Mood normal.  Behavior: Behavior normal.     Data Reviewed: No new data  Family Communication:   Disposition: Status is: Inpatient Remains inpatient appropriate because: awaiting  hospice     Time spent: 20 minutes  Author: Jerline Moon, MD 11/03/2023 3:39 PM  For on call review [email or web address].

## 2023-11-03 NOTE — Progress Notes (Signed)
 [facility] [employer]  Hospice hospital liaison note   [facility] bed was offered yesterday afternoon. Patient's son went to tour the facility and sign consents in person. Son decided to postpone accepting the bed to give family time to consider all options.   Patient no longer meets hospice IPU GIP criteria. Currently [facility] does not have any routine beds available. Patient will need to be reassessed if family chooses to move forward with [facility].    Attempt was made to contact patient's son with an update. TOC aware that liaison will continue to follow.    Thank you for the opportunity to participate in this patient's care Ardine Beckwith, LPN Hospice nurse liaison [phone]

## 2023-11-03 NOTE — Progress Notes (Signed)
 Daily Progress Note   Patient Name: Jonathan Page       Date: 11/03/2023 DOB: 07-27-1950  Age: 73 y.o. MRN#: [id number] Attending Physician: Lonita Roach, MD Primary Care Physician: [name], NP Admit Date: 10/30/2023 Length of Stay: 4 days  Reason for Consultation/Follow-up: Establishing goals of care  Subjective:   At time of EMR review in past 24 hours patient has required as needed IV Dilaudid  0.5 mg x 1 dose.  Discussed care with hospitalist and [facility] liaison today.  Patient had been offered bed at [facility] yesterday though family decided to postpone transfer.  Based on [facility] liaison assessment today, patient no longer meets hospice IPU criteria.  Currently [facility] does not have any routine beds available.  [facility] will continue to follow along to reassess patient daily about possible [facility] acceptance and transfer. Patient continuing comfort focused meds at this time.  Family providing support for patient as well.  Objective:   Vital Signs:  BP 115/67 (BP Location: Right Arm)   Pulse 96   Temp 97.6 F (36.4 C) (Oral)   Resp 16   Ht 6\' 1"  (1.854 m)   SpO2 99%   BMI 18.47 kg/m    Assessment & Plan:   Assessment: Palliative Care consult requested for goals of care discussion in this 73 y.o. male  with past medical history of non-small cell lung cancer (05/2024) with metastatic disease to spine and widespread bone on second line treatment of ramucirumab  and doxetal due to progress, recent subarachnoid hemorrhage s/p fall, diabetes, hypertension, and seasonal allergies. He was admitted on 10/30/2023 with weakness, increased shortness of breath. CT showed bilateral pleural effusion, worsening mets, and enlarged left upper lobe mass. Palliative consulted for goals of care discussions.    Recommendations/Plan: # Complex medical decision making/goals of care:  - Patient was transition to full comfort focused care on 11/01/2023 after discussion with patient,  patient's significant other, and 2 sons.  -At this time have discontinued interventions that are no longer focused on comfort such as IV fluids, imaging, or lab work.  Focusing on symptom management of pain, dyspnea, and agitation in the setting of end-of-life care.    Code Status: Do not attempt resuscitation (DNR) - Comfort care     # Symptom management: Patient is receiving these palliative interventions for symptom management with an intent to improve quality of life.   -Pain, severe in setting of metastatic NSCLC    - Continue IV hydromorphone  0.5-2 mg every 30 minutes as needed.  Continue to adjust based on patient's symptom burden.  If patient needing frequent doses, could consider continuous infusion.    -Continue gabapentin  300mg  at bedtime   -Nausea   -Continue prochlorperazine  5mg  q6hrs as needed    -Constipation    -Continue Senna 1 tab daily as needed      -Anxiety/agitation, in the setting of end-of-life care                               - Continue Ativan 0.5 mg every 4 hours as needed. Continue to adjust based on patient's symptom burden.     -Secretions, in the setting of end-of-life care                               - Continue glycopyrrolate every 4 hours as needed.   # Psychosocial  Support:  -significant other, sons   # Discharge Planning: To Be Determined  - TOC and [facility] liaison involved with discharge coordination.  Discussed with: Patient, patient's 2 sons, hospitalist, RN, TOC, [facility] liaison Thank you for allowing the palliative care team to participate in the care [name].  Barnett Libel, DO Palliative Care Provider PMT # [phone]  If patient remains symptomatic despite maximum doses, please call PMT at [phone] between 0700 and 1900. Outside of these hours, please call attending, as PMT does not have night coverage.

## 2023-11-03 NOTE — TOC Progression Note (Signed)
 Transition of Care [facility]) - Progression Note    Patient Details  Name: Jonathan Page MRN: [id number] Date of Birth: [birth year]  Transition of Care [facility]) CM/SW Contact  Tessie Fila, RN Phone Number: 11/03/2023, 3:18 PM  Clinical Narrative:    [name] with [facility] states she is waiting to hear from pt son, if family is still in agreement with pt transferring to [facility]. The son feels pt is comfortable here. Son has concerns related to finances. [facility] does not have a routine bed available at [facility] at this time. TOC continuing to follow.   Expected Discharge Plan: Home w Hospice Care Barriers to Discharge: Continued Medical Work up  Expected Discharge Plan and Services In-house Referral: Clinical Social Work   Post Acute Care Choice: NA Living arrangements for the past 2 months: Skilled Nursing Facility [facility])                 DME Arranged: N/A DME Agency: NA       HH Arranged: NA           Social Determinants of Health (SDOH) Interventions SDOH Screenings   Food Insecurity: No Food Insecurity (10/31/2023)  Housing: Low Risk  (10/31/2023)  Transportation Needs: No Transportation Needs (10/31/2023)  Utilities: Not At Risk (10/31/2023)  Depression (PHQ2-9): Low Risk  (03/16/2023)  Social Connections: Unknown (10/31/2023)  Tobacco Use: Medium Risk (10/30/2023)    Readmission Risk Interventions    10/31/2023   10:28 AM  Readmission Risk Prevention Plan  Transportation Screening Complete  Medication Review (RN Care Manager) Complete  PCP or Specialist appointment within 3-5 days of discharge Complete  HRI or Home Care Consult Complete  SW Recovery Care/Counseling Consult Complete  Palliative Care Screening Not Complete  Comments TBD  Skilled Nursing Facility Not Complete  SNF Comments TBD

## 2023-11-03 NOTE — Plan of Care (Signed)
   Problem: Education: Goal: Ability to describe self-care measures that may prevent or decrease complications (Diabetes Survival Skills Education) will improve Outcome: Progressing Goal: Individualized Educational Video(s) Outcome: Progressing

## 2023-11-04 DIAGNOSIS — Z515 Encounter for palliative care: Secondary | ICD-10-CM | POA: Diagnosis not present

## 2023-11-04 DIAGNOSIS — J9601 Acute respiratory failure with hypoxia: Secondary | ICD-10-CM | POA: Diagnosis not present

## 2023-11-04 LAB — BODY FLUID CULTURE W GRAM STAIN: Culture: NO GROWTH

## 2023-11-04 LAB — CULTURE, BLOOD (ROUTINE X 2)
Culture: NO GROWTH
Culture: NO GROWTH
Special Requests: ADEQUATE
Special Requests: ADEQUATE

## 2023-11-04 NOTE — Progress Notes (Signed)
 Progress Note   Patient: Jonathan Page ZOX:[id number] DOB: December 10, 1950 DOA: 10/30/2023     5 DOS: the patient was seen and examined on 11/04/2023   Brief hospital course: 73 year old man PMH non-small cell lung cancer metastatic to bone, progressive despite second line therapy, presented with shortness of breath.  CT showed progression of disease, patient admitted for new hypoxia with respiratory failure, further evaluation.  Treated with antibiotics, underwent thoracentesis with small postprocedure pneumothorax which is stable.  Pulmonology recommended conservative management.  Seen by oncology with recommendation for no further chemotherapy.  Seen by palliative care for ultimately patient elected comfort care.  Consultants Oncology  Palliative medicine  Procedures/Events 5/20 left thoracentesis   Assessment and Plan: * Acute respiratory failure with hypoxia (HCC) Malignant left pleural effusion Right pleural effusion Non-small cell lung cancer metastatic to bone (HCC) Small left-sided pneumothorax status postthoracentesis Admitted for acute hypoxic respiratory failure, now on oxygen.  Plan pulm on oxygen.  Likely secondary to progression of disease but infection could not be ruled out.  Treated empirically with antibiotics. CT no PE.  Enlarging left upper lobe mass with worsening spread, increasing size and number of pulmonary metastases and nodules, enlarging bilateral pleural effusions, right upper lobe airspace disease pneumonia versus lymphangitic spread, diffuse osseous metastatic disease. Small pneumothorax status postthoracentesis.  Pulmonology recommended conservative management if repeat chest x-ray stable.  Repeat chest x-ray showed stable left apical and lateral hydropneumothorax, no evidence of tension physiology.  Continue conservative management. Per oncology not a candidate for further oncologic intervention. Comfort care initiated.  Disposition pending     Iron  deficiency  anemia Thrombocytopenia (HCC) Anemia likely multifactorial secondary to malignancy, recent chemotherapy, poor nutritional status  Platelets unchanged 112K last check Hemoglobin up to 7.9 status post 1 unit PRBC as appropriate. Comfort care   AKI (acute kidney injury) (HCC) NAGMA Cr 1.7 on admission from baseline 1.3.  Improved with fluids. Comfort care, no further monitoring.   Uncontrolled type 2 diabetes mellitus with hyperglycemia, without long-term current use of insulin  (HCC) Neuropathy Hypoglycemia Glucose low normal.  Hypoglycemia resolved. Holding glipizide , insulin . Continue gabapentin .   Hypotension Tachycardia from advanced pulmonary cancer.  No signs or symptoms of sepsis. Terminal illness   PMH SAH one month ago  Pressure injury to sacrum stage 3, present on admission   Underweight  Body mass index is 18.47 kg/m.    Disposition From: [facility] To: Residential hospice.  Family undecided      Subjective:  Seen and examined earlier this morning.  No major events overnight or this morning.  No complaints but not a great historian.  Denies pain.  Appears comfortable  Physical Exam: Vitals:   11/03/23 1339 11/03/23 2155 11/04/23 0529 11/04/23 1326  BP: 106/70 98/71 99/68  (!) 87/63  Pulse: (!) 105 (!) 103 95   Resp: 16 18 16 18   Temp: (!) 97.4 F (36.3 C) 98.4 F (36.9 C) 97.8 F (36.6 C) 97.7 F (36.5 C)  TempSrc: Oral Oral Oral Oral  SpO2: 97% 98% 100% 97%  Height:       GENERAL: No apparent distress.  Appears frail. HEENT: MMM.  Vision and hearing grossly intact.  NECK: Supple.  No apparent JVD.  RESP:  No IWOB.  Fair aeration bilaterally. CVS:  RRR. Heart sounds normal.  ABD/GI/GU: BS+. Abd soft, NTND.  MSK/EXT:   No apparent deformity. Moves extremities but very weak.  SKIN: no apparent skin lesion or wound NEURO: Awake and alert. Oriented fairly.  No apparent focal neuro deficit. PSYCH: Calm. Normal affect.   Data  Reviewed: No new data  Family Communication: None at bedside.  Disposition: Status is: Inpatient Remains inpatient appropriate because: awaiting hospice     Time spent: 25 minutes  Author: Theadore Finger, MD 11/04/2023 2:28 PM  For on call review [email or web address].

## 2023-11-04 NOTE — Plan of Care (Signed)
  Problem: Education: Goal: Ability to describe self-care measures that may prevent or decrease complications (Diabetes Survival Skills Education) will improve Outcome: Not Progressing Goal: Individualized Educational Video(s) Outcome: Not Progressing   Problem: Coping: Goal: Ability to adjust to condition or change in health will improve Outcome: Progressing   Problem: Fluid Volume: Goal: Ability to maintain a balanced intake and output will improve Outcome: Progressing

## 2023-11-05 DIAGNOSIS — C7951 Secondary malignant neoplasm of bone: Secondary | ICD-10-CM | POA: Diagnosis not present

## 2023-11-05 DIAGNOSIS — J9601 Acute respiratory failure with hypoxia: Secondary | ICD-10-CM | POA: Diagnosis not present

## 2023-11-05 DIAGNOSIS — C3490 Malignant neoplasm of unspecified part of unspecified bronchus or lung: Secondary | ICD-10-CM | POA: Diagnosis not present

## 2023-11-05 NOTE — Plan of Care (Signed)
  Problem: Fluid Volume: Goal: Ability to maintain a balanced intake and output will improve Outcome: Progressing   Problem: Coping: Goal: Ability to adjust to condition or change in health will improve Outcome: Not Progressing   Problem: Health Behavior/Discharge Planning: Goal: Ability to manage health-related needs will improve Outcome: Not Progressing

## 2023-11-05 NOTE — Progress Notes (Signed)
 [facility] Liaison Note  Met with patient at bedside. He was sleepy, stating he had just received a dose of pain medication.  Spoke with bedside nurse stated patient has received 1 dose of Dilaudid  1 mg IV in last 24 hours.  Patient does not meet IPU criteria at this time. Family had toured [facility] but opted not to accept bed when it was offered to give family time to consider all options.  Per bedside nurse, awaiting TOC involvement to discuss further options moving forward. Family had not returned calls with liaison.  Will continue to follow for discharge disposition.  Please call with any questions or concerns.  Thank you, [name], BSN, [facility] [phone]

## 2023-11-05 NOTE — TOC Progression Note (Signed)
 Transition of Care [facility]) - Progression Note    Patient Details  Name: Jonathan Page MRN: [id number] Date of Birth: Nov 11, 1950  Transition of Care [facility]) CM/SW Contact  Levie Ream, RN Phone Number: 11/05/2023, 5:20 PM  Clinical Narrative:    Notified by Odie Benne, LPN that [facility] nurse request update on pt's status at 1258; spoke w/ Wynetta Heckle, supervisor for [facility] at [phone]; previous note says pt does not meet criteria for residential hospice, and pt's son has financial concerns; she says pt will need to be re-evaluated if pt's family desires d/c to facility; will pass on to oncoming [facility] for follow up w/ family/[facility].   Expected Discharge Plan: Home w Hospice Care Barriers to Discharge: Continued Medical Work up  Expected Discharge Plan and Services In-house Referral: Clinical Social Work   Post Acute Care Choice: NA Living arrangements for the past 2 months: Skilled Nursing Facility [facility] administrator)                 DME Arranged: N/A DME Agency: NA       HH Arranged: NA           Social Determinants of Health (SDOH) Interventions SDOH Screenings   Food Insecurity: No Food Insecurity (10/31/2023)  Housing: Low Risk  (10/31/2023)  Transportation Needs: No Transportation Needs (10/31/2023)  Utilities: Not At Risk (10/31/2023)  Depression (PHQ2-9): Low Risk  (03/16/2023)  Social Connections: Unknown (10/31/2023)  Tobacco Use: Medium Risk (10/30/2023)    Readmission Risk Interventions    10/31/2023   10:28 AM  Readmission Risk Prevention Plan  Transportation Screening Complete  Medication Review (RN Care Manager) Complete  PCP or Specialist appointment within 3-5 days of discharge Complete  HRI or Home Care Consult Complete  SW Recovery Care/Counseling Consult Complete  Palliative Care Screening Not Complete  Comments TBD  Skilled Nursing Facility Not Complete  SNF Comments TBD

## 2023-11-05 NOTE — Progress Notes (Signed)
 Progress Note   Patient: Jonathan Page HYQ:[id number] DOB: 07-02-1950 DOA: 10/30/2023     6 DOS: the patient was seen and examined on 11/05/2023   Brief hospital course: 73 year old man PMH non-small cell lung cancer metastatic to bone, progressive despite second line therapy, presented with shortness of breath.  CT showed progression of disease, patient admitted for new hypoxia with respiratory failure, further evaluation.  Treated with antibiotics, underwent thoracentesis with small postprocedure pneumothorax which is stable.  Pulmonology recommended conservative management.  Seen by oncology with recommendation for no further chemotherapy.  Seen by palliative care for ultimately patient elected comfort care.  Consultants Oncology  Palliative medicine  Procedures/Events 5/20 left thoracentesis   Assessment and Plan: * Acute respiratory failure with hypoxia (HCC) Malignant left pleural effusion Right pleural effusion Non-small cell lung cancer metastatic to bone (HCC) Small left-sided pneumothorax status postthoracentesis Admitted for acute hypoxic respiratory failure, now on oxygen.  Plan pulm on oxygen.  Likely secondary to progression of disease but infection could not be ruled out.  Treated empirically with antibiotics. CT no PE.  Enlarging left upper lobe mass with worsening spread, increasing size and number of pulmonary metastases and nodules, enlarging bilateral pleural effusions, right upper lobe airspace disease pneumonia versus lymphangitic spread, diffuse osseous metastatic disease. Small pneumothorax status postthoracentesis.  Pulmonology recommended conservative management if repeat chest x-ray stable.  Repeat chest x-ray showed stable left apical and lateral hydropneumothorax, no evidence of tension physiology.  Continue conservative management. Per oncology not a candidate for further oncologic intervention. Comfort care     Iron  deficiency anemia Thrombocytopenia  (HCC) Anemia likely multifactorial secondary to malignancy, recent chemotherapy, poor nutritional status  Platelets unchanged 112K last check Hemoglobin up to 7.9 status post 1 unit PRBC as appropriate. Comfort care   AKI (acute kidney injury) (HCC) NAGMA Cr 1.7 on admission from baseline 1.3.  Improved with fluids. Comfort care, no further monitoring.   Uncontrolled type 2 diabetes mellitus with hyperglycemia, without long-term current use of insulin  (HCC) Neuropathy Hypoglycemia Glucose low normal.  Hypoglycemia resolved. Holding glipizide , insulin . Continue gabapentin .   Hypotension Tachycardia from advanced pulmonary cancer.  No signs or symptoms of sepsis. Terminal illness   PMH SAH one month ago   Pressure injury to sacrum stage 3, present on admission    Underweight  Body mass index is 18.47 kg/m.     Disposition From: [facility] To: Residential hospice      Subjective:  Resting comfortably per 2 sons at bedside  Physical Exam: Vitals:   11/04/23 0529 11/04/23 1326 11/04/23 1457 11/04/23 2010  BP: 99/68 (!) 87/63 104/64 95/60  Pulse: 95  100 (!) 110  Resp: 16 18 16 20   Temp: 97.8 F (36.6 C) 97.7 F (36.5 C)  97.7 F (36.5 C)  TempSrc: Oral Oral  Oral  SpO2: 100% 97% 97% 97%  Height:       Physical Exam Vitals reviewed.  Constitutional:      General: He is not in acute distress.    Appearance: He is not ill-appearing or toxic-appearing.  Cardiovascular:     Rate and Rhythm: Normal rate and regular rhythm.     Heart sounds: No murmur heard. Pulmonary:     Effort: Pulmonary effort is normal. No respiratory distress.     Breath sounds: No wheezing, rhonchi or rales.  Neurological:     Mental Status: He is alert.     Data Reviewed: No new data  Family Communication: two  sons at bedside  Disposition: Status is: Inpatient Remains inpatient appropriate because: await hospice     Time spent: 15  minutes  Author: Jerline Moon, MD 11/05/2023 11:09 AM  For on call review [email or web address].

## 2023-11-06 DIAGNOSIS — N179 Acute kidney failure, unspecified: Secondary | ICD-10-CM | POA: Diagnosis not present

## 2023-11-06 DIAGNOSIS — J910 Malignant pleural effusion: Secondary | ICD-10-CM | POA: Diagnosis not present

## 2023-11-06 DIAGNOSIS — C7951 Secondary malignant neoplasm of bone: Secondary | ICD-10-CM | POA: Diagnosis not present

## 2023-11-06 DIAGNOSIS — J9601 Acute respiratory failure with hypoxia: Secondary | ICD-10-CM | POA: Diagnosis not present

## 2023-11-06 DIAGNOSIS — Z515 Encounter for palliative care: Secondary | ICD-10-CM | POA: Diagnosis not present

## 2023-11-06 DIAGNOSIS — C3490 Malignant neoplasm of unspecified part of unspecified bronchus or lung: Secondary | ICD-10-CM | POA: Diagnosis not present

## 2023-11-06 MED ORDER — OXYCODONE HCL 5 MG PO TABS
5.0000 mg | ORAL_TABLET | ORAL | Status: DC | PRN
  Administered 2023-11-06 – 2023-11-16 (×24): 5 mg via ORAL
  Filled 2023-11-06 (×24): qty 1

## 2023-11-06 MED ORDER — HYDROMORPHONE HCL 1 MG/ML IJ SOLN: 0.5000 mg | INTRAMUSCULAR | Status: DC | PRN

## 2023-11-06 MED ORDER — OXYCODONE HCL 5 MG PO TABS
5.0000 mg | ORAL_TABLET | ORAL | Status: DC | PRN
Start: 2023-11-06 — End: 2023-11-06

## 2023-11-06 MED ORDER — HYDROMORPHONE HCL 1 MG/ML IJ SOLN
0.5000 mg | INTRAMUSCULAR | Status: DC | PRN
  Administered 2023-11-06 – 2023-11-16 (×7): 1 mg via INTRAVENOUS
  Filled 2023-11-06 (×8): qty 1

## 2023-11-06 NOTE — TOC Progression Note (Addendum)
 Transition of Care [facility]) - Progression Note    Patient Details  Name: Jonathan Page MRN: [id number] Date of Birth: 05-29-1951  Transition of [facility]) CM/SW Contact  Bari Leys, RN Phone Number: 11/06/2023, 11:06 AM  Clinical Narrative:   Teams chat received from Palliative Care requesting re-eval by [facility], Melissa Stenson confirmed [facility] will re-eval for  Home vs  Residential Hospice, await recommendation, will follow up with family with recommendation.   -1:35 Per [name], [facility], pt not residential hospice appropriate. Significant out of town until Wednesday. TOC will follow up with Significant Other for dc plans/disposition.     Expected Discharge Plan: Home w Hospice Care Barriers to Discharge: Continued Medical Work up  Expected Discharge Plan and Services In-house Referral: Clinical Social Work   Post Acute Care Choice: NA Living arrangements for the past 2 months: Skilled Nursing Facility [facility])                 DME Arranged: N/A DME Agency: NA       HH Arranged: NA           Social Determinants of Health (SDOH) Interventions SDOH Screenings   Food Insecurity: No Food Insecurity (10/31/2023)  Housing: Low Risk  (10/31/2023)  Transportation Needs: No Transportation Needs (10/31/2023)  Utilities: Not At Risk (10/31/2023)  Depression (PHQ2-9): Low Risk  (03/16/2023)  Social Connections: Unknown (10/31/2023)  Tobacco Use: Medium Risk (10/30/2023)    Readmission Risk Interventions    10/31/2023   10:28 AM  Readmission Risk Prevention Plan  Transportation Screening Complete  Medication Review (RN Care Manager) Complete  PCP or Specialist appointment within 3-5 days of discharge Complete  HRI or Home Care Consult Complete  SW Recovery Care/Counseling Consult Complete  Palliative Care Screening Not Complete  Comments TBD  Skilled Nursing Facility Not Complete  SNF Comments TBD

## 2023-11-06 NOTE — Progress Notes (Addendum)
 Daily Progress Note   Patient Name: Jonathan Page       Date: 11/06/2023 DOB: [birth year]  Age: 73 y.o. MRN#: [id number] Attending Physician: Lonita Roach, MD Primary Care Physician: [name], NP Admit Date: 10/30/2023 Length of Stay: 7 days  Reason for Consultation/Follow-up: Establishing goals of care  Subjective:    At time of EMR review in past 24 hours patient has received IV Dilaudid  1 mg x 4 doses. Reviewed recent hospitalist, TOC, and hospice documentation.  [facility] has been following along.  Initially patient was appropriate for IPU though in became more stable and so was not appropriate for inpatient status anymore.  [facility] continues to follow along and evaluate for appropriateness of IPU.  Presented to bedside to see patient.  No visitors present at bedside.  Patient appears frail and is grimacing laying in bed.  Patient soft-spoken though noting he is in pain.  Noted would ask nurse to bring medication to assist.  Patient voiced appreciation for this.  Spent time providing emotional support via active listening and answering questions as able at that time.  Noted palliative medicine team to continue following patient's medical journey.  Discussed care with hospitalist, TOC, [facility] liaison, and RN to coordinate care.  Update: Able to speak with [facility] liaison.  Patient not appropriate for IPU at this time.  Family not returning calls to [facility] liaison.  Will start patient on oral pain medications as needed to be received prior to IV medications.  If patient does not become appropriate for IPU, will need to determine discharge planning where patient can receive comfort focused care.  Have involved TOC to assist with this coordination of care. Objective:   Vital Signs:  BP 101/68 (BP Location: Right Arm)   Pulse (!) 110   Temp 98.1 F (36.7 C) (Oral)   Resp 15   Ht 6\' 1"  (1.854 m)   SpO2 92%   BMI 18.47 kg/m    Assessment & Plan:   Assessment: Palliative Care  consult requested for goals of care discussion in this 74 y.o. male  with past medical history of non-small cell lung cancer (05/2024) with metastatic disease to spine and widespread bone on second line treatment of ramucirumab  and doxetal due to progress, recent subarachnoid hemorrhage s/p fall, diabetes, hypertension, and seasonal allergies. He was admitted on 10/30/2023 with weakness, increased shortness of breath. CT showed bilateral pleural effusion, worsening mets, and enlarged left upper lobe mass. Palliative consulted for goals of care discussions.    Recommendations/Plan: # Complex medical decision making/goals of care:  - Patient was transition to full comfort focused care on 11/01/2023 after discussion with patient, patient's significant other, and 2 sons.  -At this time have discontinued interventions that are no longer focused on comfort such as IV fluids, imaging, or lab work.  Focusing on symptom management of pain, dyspnea, and agitation in the setting of end-of-life care.    Code Status: Do not attempt resuscitation (DNR) - Comfort care     # Symptom management: Patient is receiving these palliative interventions for symptom management with an intent to improve quality of life.   -Pain, severe in setting of metastatic NSCLC    - Change IV hydromorphone  0.5-2 mg every 2 hours as needed after oral oxycodone    -Continue gabapentin  300mg  at bedtime    - Start oxycodone  5 mg every 4 hours as needed  -Nausea   -Continue prochlorperazine  5mg  q6hrs as needed    -Constipation    -  Continue Senna 1 tab daily as needed      -Anxiety/agitation, in the setting of end-of-life care                               - Continue Ativan 0.5 mg every 4 hours as needed. Continue to adjust based on patient's symptom burden.     -Secretions, in the setting of end-of-life care                               - Continue glycopyrrolate every 4 hours as needed.   # Psychosocial Support:  -significant other,  sons   # Discharge Planning: To Be Determined  - TOC and [facility] liaison involved with discharge coordination.  Asked [facility] to continue evaluating patient for appropriateness of IPU transfer.  If patient not accepted to [facility], would appreciate TOC's assistance working with family to coordinate discharge planning where patient can receive comfort focused care moving forward.  Discussed with: Patient,  hospitalist, RN, TOC, [facility] liaison  Thank you for allowing the palliative care team to participate in the care [name].  Barnett Libel, DO Palliative Care Provider PMT # [phone]  If patient remains symptomatic despite maximum doses, please call PMT at [phone] between 0700 and 1900. Outside of these hours, please call attending, as PMT does not have night coverage.   Personally spent 35 minutes in patient care including extensive chart review (labs, imaging, progress/consult notes, vital signs), medically appropraite exam, discussed with treatment team, education to patient, family, and staff, documenting clinical information, medication review and management, coordination of care, and available advanced directive documents.

## 2023-11-06 NOTE — Progress Notes (Signed)
 Progress Note   Patient: Jonathan Page AVW:[id number] DOB: 1951/05/02 DOA: 10/30/2023     7 DOS: the patient was seen and examined on 11/06/2023   Brief hospital course: 73 year old man PMH non-small cell lung cancer metastatic to bone, progressive despite second line therapy, presented with shortness of breath.  CT showed progression of disease, patient admitted for new hypoxia with respiratory failure, further evaluation.  Treated with antibiotics, underwent thoracentesis with small postprocedure pneumothorax which is stable.  Pulmonology recommended conservative management.  Seen by oncology with recommendation for no further chemotherapy.  Seen by palliative care for ultimately patient elected comfort care.  Consultants Oncology  Palliative medicine  Procedures/Events 5/20 left thoracentesis   Assessment and Plan: * Acute respiratory failure with hypoxia (HCC) Malignant left pleural effusion Right pleural effusion Non-small cell lung cancer metastatic to bone (HCC) Small left-sided pneumothorax status postthoracentesis Admitted for acute hypoxic respiratory failure, now on oxygen.  Plan pulm on oxygen.  Likely secondary to progression of disease but infection could not be ruled out.  Treated empirically with antibiotics. CT no PE.  Enlarging left upper lobe mass with worsening spread, increasing size and number of pulmonary metastases and nodules, enlarging bilateral pleural effusions, right upper lobe airspace disease pneumonia versus lymphangitic spread, diffuse osseous metastatic disease. Small pneumothorax status postthoracentesis.  Pulmonology recommended conservative management if repeat chest x-ray stable.  Repeat chest x-ray showed stable left apical and lateral hydropneumothorax, no evidence of tension physiology.  Continue conservative management. Per oncology not a candidate for further oncologic intervention. Comfort care     Iron  deficiency anemia Thrombocytopenia  (HCC) Anemia likely multifactorial secondary to malignancy, recent chemotherapy, poor nutritional status  Platelets unchanged 112K last check Hemoglobin up to 7.9 status post 1 unit PRBC as appropriate. Comfort care   AKI (acute kidney injury) (HCC) NAGMA Cr 1.7 on admission from baseline 1.3.  Improved with fluids. Comfort care, no further monitoring.   Uncontrolled type 2 diabetes mellitus with hyperglycemia, without long-term current use of insulin  (HCC) Neuropathy Hypoglycemia Glucose low normal.  Hypoglycemia resolved. Holding glipizide , insulin . Continue gabapentin .   Hypotension Tachycardia from advanced pulmonary cancer.  No signs or symptoms of sepsis. Terminal illness   PMH SAH one month ago   Pressure injury to sacrum stage 3, present on admission    Underweight  Body mass index is 18.47 kg/m.    Disposition From: [facility] To: Residential hospice was planned, but disposition is now unclear      Subjective:  Sleeping  Physical Exam: Vitals:   11/04/23 2010 11/05/23 1809 11/05/23 2100 11/06/23 0450  BP: 95/60 96/62 98/71  101/68  Pulse: (!) 110 (!) 103 (!) 108 (!) 110  Resp: 20 15 14 15   Temp: 97.7 F (36.5 C) 98.2 F (36.8 C) 98.5 F (36.9 C) 98.1 F (36.7 C)  TempSrc: Oral Oral Oral Oral  SpO2: 97% 100% 97% 92%  Height:       Physical Exam Vitals reviewed.  Constitutional:      General: He is not in acute distress.    Appearance: He is not ill-appearing or toxic-appearing.  Cardiovascular:     Rate and Rhythm: Normal rate and regular rhythm.     Heart sounds: No murmur heard. Pulmonary:     Effort: Pulmonary effort is normal. No respiratory distress.     Breath sounds: No wheezing, rhonchi or rales.  Neurological:     Mental Status: He is alert.     Data Reviewed: No new  data  Family Communication: None present  Disposition: Status is: Inpatient Remains inpatient appropriate because: Needs safe  disposition with hospice     Time spent: 20 minutes  Author: Jerline Moon, MD 11/06/2023 8:43 AM  For on call review [email or web address].

## 2023-11-06 NOTE — Plan of Care (Signed)
  Problem: Fluid Volume: Goal: Ability to maintain a balanced intake and output will improve Outcome: Not Progressing   Problem: Health Behavior/Discharge Planning: Goal: Ability to manage health-related needs will improve Outcome: Not Progressing   Problem: Nutritional: Goal: Maintenance of adequate nutrition will improve Outcome: Not Progressing Goal: Progress toward achieving an optimal weight will improve Outcome: Not Progressing   Problem: Coping: Goal: Level of anxiety will decrease Outcome: Progressing

## 2023-11-06 NOTE — Progress Notes (Signed)
 [id number] Liaison Note  Met with patient at bedside. Patient denies any pain and can barely keep his eyes open, having just received IV pain medications. Lunch tray remains untouched.   Yesterday, patient denied any pain when I met with him at bedside, though he had just received dose of IV Dilaudid , 1st in greater than 24 hours. Patient was also able to feed self when set up to do so.  Phone conversation with Dr. Azalea Page, at this time it does not appear patient is IPU appropriate. Discussion around transitioning to po medications and evaluating effectiveness for possible discharge home.  Was able to reach son, Jonathan Page today. Left message for Jonathan Page, significant other, to return call. [name] relates that he would like to see how patient does over the next couple of days with oral medication management.Jonathan Page has been out of town for graduation and will be returning on Wednesday and all can participate in plan for discharge.  Dr. Azalea Page, Dr. Jerilynn Page and [facility] aware.  Please call with any questions or concerns.  Thank you, Jonathan Page, BSN, [facility]  [phone]

## 2023-11-07 DIAGNOSIS — J9601 Acute respiratory failure with hypoxia: Secondary | ICD-10-CM | POA: Diagnosis not present

## 2023-11-07 DIAGNOSIS — C3490 Malignant neoplasm of unspecified part of unspecified bronchus or lung: Secondary | ICD-10-CM | POA: Diagnosis not present

## 2023-11-07 DIAGNOSIS — E1165 Type 2 diabetes mellitus with hyperglycemia: Secondary | ICD-10-CM | POA: Diagnosis not present

## 2023-11-07 DIAGNOSIS — C7951 Secondary malignant neoplasm of bone: Secondary | ICD-10-CM | POA: Diagnosis not present

## 2023-11-07 MED ORDER — CHLORHEXIDINE GLUCONATE CLOTH 2 % EX PADS
6.0000 | MEDICATED_PAD | Freq: Every day | CUTANEOUS | Status: DC
  Administered 2023-11-07 – 2023-11-14 (×7): 6 via TOPICAL

## 2023-11-07 MED ORDER — SODIUM CHLORIDE 0.9% FLUSH
10.0000 mL | INTRAVENOUS | Status: DC | PRN
  Administered 2023-11-14: 10 mL

## 2023-11-07 MED ORDER — SODIUM CHLORIDE 0.9% FLUSH
10.0000 mL | Freq: Two times a day (BID) | INTRAVENOUS | Status: DC
  Administered 2023-11-07 – 2023-11-15 (×8): 10 mL

## 2023-11-07 NOTE — Progress Notes (Signed)
 Progress Note   Patient: Jonathan Page UEA:[id number] DOB: 03-20-1951 DOA: 10/30/2023     8 DOS: the patient was seen and examined on 11/07/2023   Brief hospital course: 73 year old man PMH non-small cell lung cancer metastatic to bone, progressive despite second line therapy, presented with shortness of breath.  CT showed progression of disease, patient admitted for new hypoxia with respiratory failure, further evaluation.  Treated with antibiotics, underwent thoracentesis with small postprocedure pneumothorax which is stable.  Pulmonology recommended conservative management.  Seen by oncology with recommendation for no further chemotherapy.  Seen by palliative care for ultimately patient elected comfort care.  Family declined available hospice bed, now not inpatient hospice eligible.  Family returning 5/28 to discuss disposition.  Consultants Oncology  Palliative medicine  Procedures/Events 5/20 left thoracentesis   Assessment and Plan: * Acute respiratory failure with hypoxia (HCC) Malignant left pleural effusion Right pleural effusion Non-small cell lung cancer metastatic to bone (HCC) Small left-sided pneumothorax status postthoracentesis Admitted for acute hypoxic respiratory failure, now on oxygen.  Plan pulm on oxygen.  Likely secondary to progression of disease but infection could not be ruled out.  Treated empirically with antibiotics. CT no PE.  Enlarging left upper lobe mass with worsening spread, increasing size and number of pulmonary metastases and nodules, enlarging bilateral pleural effusions, right upper lobe airspace disease pneumonia versus lymphangitic spread, diffuse osseous metastatic disease. Small pneumothorax status postthoracentesis.  Pulmonology recommended conservative management if repeat chest x-ray stable.  Repeat chest x-ray showed stable left apical and lateral hydropneumothorax, no evidence of tension physiology.  Continue conservative management. Per  oncology not a candidate for further oncologic intervention. Comfort care     Iron  deficiency anemia Thrombocytopenia (HCC) Anemia likely multifactorial secondary to malignancy, recent chemotherapy, poor nutritional status  Platelets unchanged 112K last check Hemoglobin up to 7.9 status post 1 unit PRBC as appropriate. Comfort care   AKI (acute kidney injury) (HCC) NAGMA Cr 1.7 on admission from baseline 1.3.  Improved with fluids. Comfort care, no further monitoring.   Uncontrolled type 2 diabetes mellitus with hyperglycemia, without long-term current use of insulin  (HCC) Neuropathy Hypoglycemia Glucose low normal.  Hypoglycemia resolved. Holding glipizide , insulin . Continue gabapentin .   Hypotension Tachycardia from advanced pulmonary cancer.  No signs or symptoms of sepsis. Terminal illness   PMH SAH one month ago   Pressure injury to sacrum stage 3, present on admission    Underweight  Body mass index is 18.47 kg/m.    Disposition From: [facility] To: Residential hospice was planned, but disposition is now unclear      Subjective:  Feels ok, pain helped by medications, eating a little, breathing ok  Physical Exam: Vitals:   11/05/23 2100 11/06/23 0450 11/06/23 0901 11/06/23 2129  BP: 98/71 101/68  94/64  Pulse: (!) 108 (!) 110  (!) 102  Resp: 14 15  14   Temp: 98.5 F (36.9 C) 98.1 F (36.7 C)  97.7 F (36.5 C)  TempSrc: Oral Oral  Oral  SpO2: 97% 92%  92%  Weight:   66.8 kg   Height:       Physical Exam Vitals reviewed.  Constitutional:      General: He is not in acute distress.    Appearance: He is not ill-appearing or toxic-appearing.  Cardiovascular:     Rate and Rhythm: Normal rate and regular rhythm.     Heart sounds: No murmur heard. Pulmonary:     Effort: Pulmonary effort is normal. No respiratory  distress.     Breath sounds: No wheezing, rhonchi or rales.  Neurological:     Mental Status: He is alert.   Psychiatric:        Mood and Affect: Mood normal.        Behavior: Behavior normal.     Data Reviewed: No new data  Family Communication:   Disposition: Status is: Inpatient Remains inpatient appropriate because: comfort care     Time spent: 15 minutes  Author: Jerline Moon, MD 11/07/2023 5:46 PM  For on call review [email or web address].

## 2023-11-08 DIAGNOSIS — J9601 Acute respiratory failure with hypoxia: Secondary | ICD-10-CM | POA: Diagnosis not present

## 2023-11-08 MED ORDER — ENSURE PLUS HIGH PROTEIN PO LIQD
237.0000 mL | Freq: Two times a day (BID) | ORAL | Status: DC
  Administered 2023-11-08 – 2023-11-13 (×9): 237 mL via ORAL

## 2023-11-08 MED ORDER — OXYCODONE HCL 5 MG PO TABS: 5.0000 mg | ORAL_TABLET | ORAL | 0 refills | Status: DC | PRN

## 2023-11-08 MED ORDER — ENSURE PLUS HIGH PROTEIN PO LIQD: 237.0000 mL | Freq: Two times a day (BID) | ORAL | 0 refills | Status: DC

## 2023-11-08 NOTE — TOC Progression Note (Signed)
 Transition of Care [facility]) - Progression Note    Patient Details  Name: Jonathan Page MRN: [id number] Date of Birth: [birth year]  Transition of Care [facility]) CM/SW Contact  Bari Leys, RN Phone Number: 11/08/2023, 1:21 PM  Clinical Narrative: Call to patient's Jonathan Page,  introduced self and role of TOC/NCM,  son states patient's significant other just arrived back to town today and will be up to visit patient later today. Son states patient returning home with home hospice is not a an option due to patients current level of care with no 24/7 home assistance. He confirmed patient does not have LTC insurance or LTC Medicaid to his knowledge. NCM will make a referral to [employer] to assist with process for LTC and applying for [employer], team notified. TOC will continue to follow.       Expected Discharge Plan: Home w Hospice Care Barriers to Discharge: Continued Medical Work up  Expected Discharge Plan and Services In-house Referral: Clinical Social Work   Post Acute Care Choice: NA Living arrangements for the past 2 months: Skilled Nursing Facility [facility])                 DME Arranged: N/A DME Agency: NA       HH Arranged: NA           Social Determinants of Health (SDOH) Interventions SDOH Screenings   Food Insecurity: No Food Insecurity (10/31/2023)  Housing: Low Risk  (10/31/2023)  Transportation Needs: No Transportation Needs (10/31/2023)  Utilities: Not At Risk (10/31/2023)  Depression (PHQ2-9): Low Risk  (03/16/2023)  Social Connections: Unknown (10/31/2023)  Tobacco Use: Medium Risk (10/30/2023)    Readmission Risk Interventions    10/31/2023   10:28 AM  Readmission Risk Prevention Plan  Transportation Screening Complete  Medication Review (RN Care Manager) Complete  PCP or Specialist appointment within 3-5 days of discharge Complete  HRI or Home Care Consult Complete  SW Recovery Care/Counseling Consult Complete   Palliative Care Screening Not Complete  Comments TBD  Skilled Nursing Facility Not Complete  SNF Comments TBD

## 2023-11-08 NOTE — Plan of Care (Signed)
  Problem: Coping: Goal: Ability to adjust to condition or change in health will improve Outcome: Not Progressing   Problem: Fluid Volume: Goal: Ability to maintain a balanced intake and output will improve Outcome: Not Progressing   Problem: Health Behavior/Discharge Planning: Goal: Ability to identify and utilize available resources and services will improve Outcome: Not Progressing Goal: Ability to manage health-related needs will improve Outcome: Not Progressing   Problem: Nutritional: Goal: Maintenance of adequate nutrition will improve Outcome: Not Progressing Goal: Progress toward achieving an optimal weight will improve Outcome: Not Progressing

## 2023-11-08 NOTE — Progress Notes (Signed)
 Physician Discharge Summary  Jonathan Page QMV:[id number] DOB: 18-Dec-1950 DOA: 10/30/2023  PCP: [name], NP  Admit date: 10/30/2023 Discharge date: 11/08/2023  Admitted From: SNF Disposition: To be determined  Recommendations for Outpatient Follow-up:  Follow up with hospice team as scheduled:  Discharge Condition: Poor CODE STATUS: DNR Diet recommendation: Comfort feeding as tolerated  Brief/Interim Summary: 73 year old man PMH non-small cell lung cancer metastatic to bone, progressive despite second line therapy, presented with shortness of breath. CT showed progression of disease, patient admitted for new hypoxia with respiratory failure, further evaluation. Treated with antibiotics, underwent thoracentesis with small postprocedure pneumothorax which is stable. Pulmonology recommended conservative management. Seen by oncology with recommendation for no further chemotherapy. Seen by palliative care for ultimately patient elected comfort care. Family declined available hospice bed, now not inpatient hospice eligible.   Patient currently medically stable for discharge, awaiting disposition decision by family.  Family previously declined hospice bed no longer hospice eligible, likely dispo either home with hospice versus facility.  Discharge Diagnoses:  Principal Problem:   Acute respiratory failure with hypoxia (HCC) Active Problems:   Pleural effusion, bilateral   Iron  deficiency anemia   Essential hypertension   Non-small cell lung cancer metastatic to bone (HCC)   Thrombocytopenia (HCC)   Uncontrolled type 2 diabetes mellitus with hyperglycemia, without long-term current use of insulin  (HCC)   AKI (acute kidney injury) (HCC)   Hypoglycemia   Pneumothorax   Pressure injury of skin   Malignant pleural effusion   DNR (do not resuscitate)   Cancer associated pain   Need for emotional support   Counseling and coordination of care   Acute hypoxic respiratory failure  (HCC)   Palliative care encounter   High risk medication use   Concern about end of life   Medication management   Goals of care, counseling/discussion  Acute respiratory failure with hypoxia (HCC) Malignant left pleural effusion Right pleural effusion Non-small cell lung cancer metastatic to bone (HCC) Small left-sided pneumothorax status postthoracentesis Admitted for acute hypoxic respiratory failure, now on oxygen.  Plan pulm on oxygen.  Likely secondary to progression of disease but infection could not be ruled out.  Treated empirically with antibiotics. CT no PE.  Enlarging left upper lobe mass with worsening spread, increasing size and number of pulmonary metastases and nodules, enlarging bilateral pleural effusions, right upper lobe airspace disease pneumonia versus lymphangitic spread, diffuse osseous metastatic disease. Small pneumothorax status postthoracentesis.  Pulmonology recommended conservative management if repeat chest x-ray stable.  Repeat chest x-ray showed stable left apical and lateral hydropneumothorax, no evidence of tension physiology.  Continue conservative management. Per oncology not a candidate for further oncologic intervention. Comfort care     Iron  deficiency anemia Thrombocytopenia (HCC) Anemia likely multifactorial secondary to malignancy, recent chemotherapy, poor nutritional status  No further monitoring   AKI (acute kidney injury) (HCC) NAGMA No further monitoring   Uncontrolled type 2 diabetes mellitus with hyperglycemia, without long-term current use of insulin  (HCC) Neuropathy Hypoglycemia Glucose low normal.  Hypoglycemia resolved. Holding glipizide , insulin . Continue gabapentin .   PMH SAH one month ago Pressure injury to sacrum stage 3, present on admission -continue wound care  Underweight  Body mass index is 18.47 kg/m.    Disposition From: [facility] To: Residential hospice was planned, but disposition is now  unclear  Discharge Instructions   Allergies as of 11/08/2023   Not on File      Medication List     STOP taking these  medications    Accu-Chek Aviva Plus test strip Generic drug: glucose blood   Accu-Chek Aviva Plus w/Device Kit   accu-chek soft touch lancets   Blood Glucose Monitoring Suppl Devi   dexamethasone  4 MG tablet Commonly known as: DECADRON    diclofenac  Sodium 1 % Gel Commonly known as: VOLTAREN    docusate sodium  100 MG capsule Commonly known as: COLACE   folic acid  1 MG tablet Commonly known as: FOLVITE    glipiZIDE  10 MG 24 hr tablet Commonly known as: GLUCOTROL  XL   insulin  lispro 100 UNIT/ML injection Commonly known as: HUMALOG   insulin  NPH-regular Human (70-30) 100 UNIT/ML injection   lidocaine -prilocaine  cream Commonly known as: EMLA    lisinopril  10 MG tablet Commonly known as: ZESTRIL    methocarbamol  500 MG tablet Commonly known as: ROBAXIN    ondansetron  8 MG tablet Commonly known as: ZOFRAN    prochlorperazine  10 MG tablet Commonly known as: COMPAZINE        TAKE these medications    feeding supplement Liqd Take 237 mLs by mouth 2 (two) times daily between meals. What changed: when to take this   gabapentin  300 MG capsule Commonly known as: NEURONTIN  Take 1 capsule (300 mg total) by mouth at bedtime. What changed: when to take this   oxyCODONE  5 MG immediate release tablet Commonly known as: Oxy IR/ROXICODONE  Take 1 tablet (5 mg total) by mouth every 4 (four) hours as needed for moderate pain (pain score 4-6) or severe pain (pain score 7-10) (shortness of breath). What changed:  how much to take reasons to take this Another medication with the same name was removed. Continue taking this medication, and follow the directions you see here.        Not on File  Consultations: Palliative care, IR   Procedures/Studies: DG CHEST PORT 1 VIEW Result Date: 11/01/2023 CLINICAL DATA:  Pneumothorax. EXAM: PORTABLE CHEST 1  VIEW COMPARISON:  Oct 31, 2023. FINDINGS: Stable cardiomediastinal silhouette. Right internal jugular Port-A-Cath is unchanged. Stable bilateral opacities are noted concerning for multifocal pneumonia. Small left pleural effusion is noted. Stable possible minimal left apical and basilar pneumothorax. Bony thorax is unremarkable. IMPRESSION: Stable bilateral opacities concerning for multifocal pneumonia. Small left pleural effusion. Stable minimal left-sided pneumothorax. Electronically Signed   By: Rosalene Colon M.D.   On: 11/01/2023 12:00   DG CHEST PORT 1 VIEW Result Date: 10/31/2023 CLINICAL DATA:  Pneumothorax EXAM: PORTABLE CHEST 1 VIEW COMPARISON:  10/31/2023 FINDINGS: Small left apical and lateral hydropneumothorax demonstrating a small fluid component is stable. No radiographic evidence of tension physiology. No pneumothorax lumbar right. Small right pleural effusion is stable. Extensive multifocal airspace infiltrate, more severe within the right upper lung zone and perihilar regions, is unchanged in keeping with multifocal infection. Right internal jugular chest port tip is unchanged within the right atrium. Cardiac size within normal limits. IMPRESSION: 1. Stable small left apical and lateral hydropneumothorax. No radiographic evidence of tension physiology. 2. Stable multifocal pulmonary infiltrate. 3. Stable small right pleural effusion and a Electronically Signed   By: Worthy Heads M.D.   On: 10/31/2023 20:25   US  THORACENTESIS ASP PLEURAL SPACE W/IMG GUIDE Result Date: 10/31/2023 INDICATION: 73 year old male with metastatic non-small-cell lung cancer, with new onset shortness of breath, and bilateral pleural effusion, left greater than right. IR was requested for diagnostic and therapeutic thoracentesis. EXAM: ULTRASOUND GUIDED DIAGNOSTIC AND THERAPEUTIC THORACENTESIS MEDICATIONS: 6 cc of 1% lidocaine  COMPLICATIONS: None immediate. PROCEDURE: An ultrasound guided thoracentesis was thoroughly  discussed with the patient and  questions answered. The benefits, risks, alternatives and complications were also discussed. The patient understands and wishes to proceed with the procedure. Written consent was obtained. Ultrasound was performed to localize and mark an adequate pocket of fluid in the left chest. The area was then prepped and draped in the normal sterile fashion. 1% Lidocaine  was used for local anesthesia. Under ultrasound guidance a 6 Fr Safe-T-Centesis catheter was introduced. Thoracentesis was performed. The catheter was removed and a dressing applied. FINDINGS: A total of approximately 1.10 L of clear, straw-colored pleural fluid was removed. Samples were sent to the laboratory as requested by the clinical team. IMPRESSION: Successful ultrasound guided left thoracentesis yielding 1.1 L of pleural fluid. Procedure performed by Lambert Pillion, PA-C Carim, PA-C Electronically Signed   By: Creasie Doctor M.D.   On: 10/31/2023 15:53   DG Chest 1 View Result Date: 10/31/2023 CLINICAL DATA:  Status post thoracentesis EXAM: CHEST  1 VIEW COMPARISON:  Chest x-ray 10/29/2023. FINDINGS: There is a small left-sided pneumothorax which is new from prior. There is a small left pleural effusion which has significantly decreased. Bilateral airspace disease, right greater than left, has not significantly changed. Cardiomediastinal silhouette is within normal limits. Right chest port catheter tip projects over the distal SVC. No acute fractures are identified IMPRESSION: 1. New small left-sided pneumothorax. 2. Small left pleural effusion which has significantly decreased. 3. Stable bilateral airspace disease. Electronically Signed   By: Tyron Gallon M.D.   On: 10/31/2023 15:10   CT Angio Chest Pulmonary Embolism (PE) W or WO Contrast Result Date: 10/31/2023 CLINICAL DATA:  Shortness of breath, low O2 sats. EXAM: CT ANGIOGRAPHY CHEST WITH CONTRAST TECHNIQUE: Multidetector CT imaging of the chest was performed  using the standard protocol during bolus administration of intravenous contrast. Multiplanar CT image reconstructions and MIPs were obtained to evaluate the vascular anatomy. RADIATION DOSE REDUCTION: This exam was performed according to the departmental dose-optimization program which includes automated exposure control, adjustment of the mA and/or kV according to patient size and/or use of iterative reconstruction technique. CONTRAST:  60mL OMNIPAQUE  IOHEXOL  350 MG/ML SOLN COMPARISON:  Chest CT 09/11/2023. FINDINGS: Cardiovascular: No filling defects in the pulmonary arteries to suggest pulmonary emboli. Heart is normal size. Aorta is normal caliber. Right Port-A-Cath remains in place, unchanged. Mediastinum/Nodes: Infiltrating tumor invading the left side of the mediastinum and into the left AP window, progressed since prior study. Worsening mediastinal and bilateral hilar adenopathy. No visible axillary adenopathy. Lungs/Pleura: Large loculated left pleural effusion and moderate right pleural effusion, increasing since prior study. Increasing size and number of metastases throughout the lungs. Central left upper lobe mass now measures 4.8 x 4.1 cm compared to 4.3 x 3.6 cm. Progressive tumor extension and endobronchial spread throughout the left upper lobe with confluent soft tissue thickening again noted along the peribronchovascular bundle, worsening since prior study. Extensive airspace disease throughout the right upper lobe could reflect pneumonia or lymphangitic spread of tumor. Upper Abdomen: No acute findings Musculoskeletal: Soft tissue nodule superficial to xiphoid process measures 2.6 cm, stable. Diffuse osseous metastatic disease again noted, likely slightly progressed since prior study. Review of the MIP images confirms the above findings. IMPRESSION: No evidence of pulmonary embolus. Enlarging left upper lobe mass with worsening spread a along the peribronchovascular bundle in the left upper lobe.  Increasing size and number of pulmonary metastases/nodules. Worsening mediastinal and bilateral hilar adenopathy. Enlarging bilateral pleural effusions, left larger than right. Airspace disease throughout the right upper lobe could reflect pneumonia or  lymphangitic spread of tumor. Diffuse osseous metastatic disease, likely progressed since prior study. Electronically Signed   By: Janeece Mechanic M.D.   On: 10/31/2023 00:11   DG Chest Port 1 View Result Date: 10/30/2023 CLINICAL DATA:  Shortness of breath EXAM: PORTABLE CHEST 1 VIEW COMPARISON:  10/05/2023, CT 09/11/2023 FINDINGS: Right-sided central venous port tip at the cavoatrial junction. Small right-sided pleural effusion, small moderate probably loculated left pleural effusion, increased compared to prior radiograph. Distortion and irregular density in the left upper lobe, perihilar region appears more dense radiographically compared to prior. Persistent dense left lung base consolidation. New or worsened diffuse right-sided pulmonary infiltrates or edema. No pneumothorax IMPRESSION: 1. Small right-sided pleural effusion, small to moderate probably loculated left pleural effusion, increased compared to prior radiograph. 2. New or worsened diffuse right-sided pulmonary infiltrates or edema. 3. Distortion and irregular density in the left upper lobe, perihilar region appears more dense radiographically compared to prior and corresponds to known history of malignancy. Persistent dense left lung base consolidation. Electronically Signed   By: Esmeralda Hedge M.D.   On: 10/30/2023 20:42     Subjective: No acute issues or events overnight denies nausea vomiting diarrhea constipation headache fevers chills or chest pain   Discharge Exam: Vitals:   11/06/23 2129 11/07/23 2149  BP: 94/64 131/74  Pulse: (!) 102 (!) 102  Resp: 14 14  Temp: 97.7 F (36.5 C) 97.8 F (36.6 C)  SpO2: 92% 93%   Vitals:   11/06/23 0450 11/06/23 0901 11/06/23 2129 11/07/23  2149  BP: 101/68  94/64 131/74  Pulse: (!) 110  (!) 102 (!) 102  Resp: 15  14 14   Temp: 98.1 F (36.7 C)  97.7 F (36.5 C) 97.8 F (36.6 C)  TempSrc: Oral  Oral Oral  SpO2: 92%  92% 93%  Weight:  66.8 kg    Height:        General: Pt is alert, awake, not in acute distress Cardiovascular: RRR, S1/S2 +, no rubs, no gallops Respiratory: CTA bilaterally, no wheezing, no rhonchi Abdominal: Soft, NT, ND, bowel sounds + Extremities: no edema, no cyanosis    The results of significant diagnostics from this hospitalization (including imaging, microbiology, ancillary and laboratory) are listed below for reference.     Microbiology: Recent Results (from the past 240 hours)  Blood culture (routine x 2)     Status: None   Collection Time: 10/30/23  8:40 PM   Specimen: BLOOD  Result Value Ref Range Status   Specimen Description   Final    BLOOD RIGHT PORTA CATH Performed at [facility], [address] [place], [place] [address]    Special Requests   Final    BOTTLES DRAWN AEROBIC AND ANAEROBIC Blood Culture adequate volume Performed at [facility], [address] [place], [place] [address]    Culture   Final    NO GROWTH 5 DAYS Performed at [facility], [address], [place], [place] [address]    Report Status 11/04/2023 FINAL  Final  Blood culture (routine x 2)     Status: None   Collection Time: 10/30/23  8:50 PM   Specimen: BLOOD  Result Value Ref Range Status   Specimen Description   Final    BLOOD BLOOD RIGHT FOREARM Performed at [facility], [address], [place], [place] [address]    Special Requests   Final    BOTTLES DRAWN AEROBIC AND ANAEROBIC Blood Culture adequate  volume Performed at [facility], [address], [place], [place] [address]    Culture   Final    NO GROWTH 5 DAYS Performed at [facility], [address], [place], [place] [address]    Report Status  11/04/2023 FINAL  Final  MRSA Next Gen by PCR, Nasal     Status: Abnormal   Collection Time: 10/31/23  6:14 AM   Specimen: Nasal Mucosa; Nasal Swab  Result Value Ref Range Status   MRSA by PCR Next Gen DETECTED (A) NOT DETECTED Final    Comment: (NOTE) The GeneXpert MRSA Assay (FDA approved for NASAL specimens only), is one component of a comprehensive MRSA colonization surveillance program. It is not intended to diagnose MRSA infection nor to guide or monitor treatment for MRSA infections. Test performance is not FDA approved in patients less than 14 years old. Performed at [facility], [address] [place], [place] [address]   Body fluid culture w Gram Stain     Status: None   Collection Time: 10/31/23 12:08 PM   Specimen: PATH Cytology Pleural fluid  Result Value Ref Range Status   Specimen Description   Final    PLEURAL Performed at [facility], [address] [place], [place] [address]    Special Requests   Final    NONE Performed at [facility], [address] [place], [place] [address]    Gram Stain   Final    MODERATE WBC PRESENT, PREDOMINANTLY MONONUCLEAR NO ORGANISMS SEEN    Culture   Final    NO GROWTH 3 DAYS Performed at [facility], [address], [place], [place] [address]    Report Status 11/04/2023 FINAL  Final     Labs: BNP (last 3 results) Recent Labs    10/30/23 1945  BNP 269.3*     Time spent: Over 30 minutes  SIGNED:   Haydee Lipa, DO [facility] 11/08/2023, 12:35 PM Pager   If 7PM-7AM, please contact night-coverage [email or web address]

## 2023-11-09 ENCOUNTER — Ambulatory Visit: Admitting: Internal Medicine

## 2023-11-09 ENCOUNTER — Ambulatory Visit: Admitting: Orthopedic Surgery

## 2023-11-09 ENCOUNTER — Other Ambulatory Visit

## 2023-11-09 ENCOUNTER — Ambulatory Visit

## 2023-11-09 DIAGNOSIS — J9601 Acute respiratory failure with hypoxia: Secondary | ICD-10-CM | POA: Diagnosis not present

## 2023-11-09 NOTE — Progress Notes (Signed)
 [id number] [facility]  Hospice hospital liaison note   Received request from [facility] to reevaluate for [facility]. At this time, patient does not meet criteria for hospice inpatient unit.    He is appropriate for hospice services in the home or LTC facility and we would be happy to reassess for inpatient unit appropriateness at a later time if requested.    Thank you for the opportunity to participate in this patient's care.   Ardine Beckwith, LPN Hospice hospital liaison [phone]

## 2023-11-09 NOTE — Plan of Care (Signed)
  Problem: Pain Managment: Goal: General experience of comfort will improve and/or be controlled Outcome: Progressing   Problem: Safety: Goal: Ability to remain free from injury will improve Outcome: Progressing

## 2023-11-09 NOTE — TOC Progression Note (Signed)
 Transition of Care [facility]) - Progression Note    Patient Details  Name: Jonathan Page MRN: [id number] Date of Birth: 1951-06-13  [facility]) CM/SW Contact  Tessie Fila, RN Phone Number: 11/09/2023, 2:16 PM  Clinical Narrative:    NCM spoke with supervisor Hillary Lowing and she states due to pt son stating that pt will be unsafe in the home without 24/7 support, will send out referrals for LTC placement. Pt son states he is willing to pay out of pocket for LTC. Referrals sent out, awaiting any offers.    Expected Discharge Plan: Home w Hospice Care Barriers to Discharge: Continued Medical Work up  Expected Discharge Plan and Services In-house Referral: Clinical Social Work   Post Acute Care Choice: NA Living arrangements for the past 2 months: [facility]) Expected Discharge Date: 11/09/23               DME Arranged: N/A DME Agency: NA       HH Arranged: NA           Social Determinants of Health (SDOH) Interventions SDOH Screenings   Food Insecurity: No Food Insecurity (10/31/2023)  Housing: Low Risk  (10/31/2023)  Transportation Needs: No Transportation Needs (10/31/2023)  Utilities: Not At Risk (10/31/2023)  Depression (PHQ2-9): Low Risk  (03/16/2023)  Social Connections: Unknown (10/31/2023)  Tobacco Use: Medium Risk (10/30/2023)    Readmission Risk Interventions    10/31/2023   10:28 AM  Readmission Risk Prevention Plan  Transportation Screening Complete  Medication Review (RN Care Manager) Complete  PCP or Specialist appointment within 3-5 days of discharge Complete  HRI or Home Care Consult Complete  SW Recovery Care/Counseling Consult Complete  Palliative Care Screening Not Complete  Comments TBD  Skilled Nursing Facility Not Complete  SNF Comments TBD

## 2023-11-09 NOTE — TOC Progression Note (Signed)
 Transition of Care [facility]) - Progression Note    Patient Details  Name: Jonathan Page MRN: [id number] Date of Birth: 1950/08/29  Transition of Care [facility]) CM/SW Contact  Tessie Fila, RN Phone Number: 11/09/2023, 12:43 PM  Clinical Narrative:    NCM spoke with pt son and he states he does not feel the pt is medically stable to DC home. Pt son states that pt is not safe to DC home as there will not be 24/7 support in the home. Explained pt has active discharge order and he would need to appeal decision and he was provided the number to call. Spoke with him about price range of long term care facilities monthly and he verbalizes that it could be doable. Informed him that someone from [employer] would reach out to him and he voices understanding. Spoke with Jess at [facility] and she states she will reach out to him today. Awaiting to hear from either party. TOC following.  Expected Discharge Plan: Home w Hospice Care Barriers to Discharge: Continued Medical Work up  Expected Discharge Plan and Services In-house Referral: Clinical Social Work   Post Acute Care Choice: NA Living arrangements for the past 2 months: [facility]) Expected Discharge Date: 11/09/23               DME Arranged: N/A DME Agency: NA       HH Arranged: NA           Social Determinants of Health (SDOH) Interventions SDOH Screenings   Food Insecurity: No Food Insecurity (10/31/2023)  Housing: Low Risk  (10/31/2023)  Transportation Needs: No Transportation Needs (10/31/2023)  Utilities: Not At Risk (10/31/2023)  Depression (PHQ2-9): Low Risk  (03/16/2023)  Social Connections: Unknown (10/31/2023)  Tobacco Use: Medium Risk (10/30/2023)    Readmission Risk Interventions    10/31/2023   10:28 AM  Readmission Risk Prevention Plan  Transportation Screening Complete  Medication Review (RN Care Manager) Complete  PCP or Specialist appointment within 3-5 days  of discharge Complete  HRI or Home Care Consult Complete  SW Recovery Care/Counseling Consult Complete  Palliative Care Screening Not Complete  Comments TBD  Skilled Nursing Facility Not Complete  SNF Comments TBD

## 2023-11-09 NOTE — Progress Notes (Signed)
   11/08/23 1240  Spiritual Encounters  Type of Visit Initial  Care provided to: Patient  Conversation partners present during encounter Nurse   Per referral from Barnett Libel, DO in palliative care IDT rounds, I visited briefly with [name].  [name] welcomed my visit. He did not seem to want to engage very much and shared that he was having leg pain.  I provided compassionate presence and asked if I could offer some hospitality. I engaged his RN as I felt unsure how able [name] was to call because he seemed weak; also to ask about family presence (some or infrequent?). I let [name] know that he has a Orthoptist available and will return to offer ongoing support.  Jonathan Page L. Minetta Aly, M.Div [phone]

## 2023-11-09 NOTE — Care Management Important Message (Signed)
 Important Message  Patient Details No IM Letter given due to Comfort Care. Name: Jonathan Page MRN: [id number] Date of Birth: 12/24/1950   Important Message Given:  No     Curtiss Dowdy 11/09/2023, 10:37 AM

## 2023-11-09 NOTE — Discharge Summary (Signed)
 Physician Discharge Summary  Jonathan Page ION:[id number] DOB: 08/04/1950 DOA: 10/30/2023  PCP: [name], NP  Admit date: 10/30/2023 Discharge date: 11/09/2023  Admitted From: SNF Disposition: To be determined  Recommendations for Outpatient Follow-up:  Follow up with hospice team as scheduled:  Discharge Condition: Poor CODE STATUS: DNR Diet recommendation: Comfort feeding as tolerated  Brief/Interim Summary: 73 year old man PMH non-small cell lung cancer metastatic to bone, progressive despite second line therapy, presented with shortness of breath. CT showed progression of disease, patient admitted for new hypoxia with respiratory failure, further evaluation. Treated with antibiotics, underwent thoracentesis with small postprocedure pneumothorax which is stable. Pulmonology recommended conservative management. Seen by oncology with recommendation for no further chemotherapy. Seen by palliative care for ultimately patient elected comfort care. Family declined available hospice bed, now not inpatient hospice eligible.   Patient currently medically stable for discharge, awaiting disposition decision by family.  Patient no longer hospice house eligible, likely dispo either home with hospice versus facility with hospice but unclear if patient has insurance coverage for facility.  Discharge Diagnoses:  Principal Problem:   Acute respiratory failure with hypoxia (HCC) Active Problems:   Pleural effusion, bilateral   Iron  deficiency anemia   Essential hypertension   Non-small cell lung cancer metastatic to bone (HCC)   Thrombocytopenia (HCC)   Uncontrolled type 2 diabetes mellitus with hyperglycemia, without long-term current use of insulin  (HCC)   AKI (acute kidney injury) (HCC)   Hypoglycemia   Pneumothorax   Pressure injury of skin   Malignant pleural effusion   DNR (do not resuscitate)   Cancer associated pain   Need for emotional support   Counseling and coordination of  care   Acute hypoxic respiratory failure (HCC)   Palliative care encounter   High risk medication use   Concern about end of life   Medication management   Goals of care, counseling/discussion  Acute respiratory failure with hypoxia (HCC) Malignant left pleural effusion Right pleural effusion Non-small cell lung cancer metastatic to bone (HCC) Small left-sided pneumothorax status postthoracentesis Admitted for acute hypoxic respiratory failure, now on oxygen.  Plan pulm on oxygen.  Likely secondary to progression of disease but infection could not be ruled out.  Treated empirically with antibiotics. CT no PE.  Enlarging left upper lobe mass with worsening spread, increasing size and number of pulmonary metastases and nodules, enlarging bilateral pleural effusions, right upper lobe airspace disease pneumonia versus lymphangitic spread, diffuse osseous metastatic disease. Small pneumothorax status postthoracentesis.  Pulmonology recommended conservative management if repeat chest x-ray stable.  Repeat chest x-ray showed stable left apical and lateral hydropneumothorax, no evidence of tension physiology.  Continue conservative management. Per oncology not a candidate for further oncologic intervention. Comfort care     Iron  deficiency anemia Thrombocytopenia (HCC) Anemia likely multifactorial secondary to malignancy, recent chemotherapy, poor nutritional status  No further monitoring   AKI (acute kidney injury) (HCC) NAGMA No further monitoring   Uncontrolled type 2 diabetes mellitus with hyperglycemia, without long-term current use of insulin  (HCC) Neuropathy Hypoglycemia Glucose low normal.  Hypoglycemia resolved. Holding glipizide , insulin . Continue gabapentin .   PMH SAH one month ago Pressure injury to sacrum stage 3, present on admission -continue wound care  Underweight  Body mass index is 18.47 kg/m.    Disposition From: [facility] To: Residential  hospice was planned, but disposition is now unclear  Discharge Instructions   Allergies as of 11/09/2023   Not on File      Medication  List     STOP taking these medications    Accu-Chek Aviva Plus test strip Generic drug: glucose blood   Accu-Chek Aviva Plus w/Device Kit   accu-chek soft touch lancets   Blood Glucose Monitoring Suppl Devi   dexamethasone  4 MG tablet Commonly known as: DECADRON    diclofenac  Sodium 1 % Gel Commonly known as: VOLTAREN    docusate sodium  100 MG capsule Commonly known as: COLACE   folic acid  1 MG tablet Commonly known as: FOLVITE    glipiZIDE  10 MG 24 hr tablet Commonly known as: GLUCOTROL  XL   insulin  lispro 100 UNIT/ML injection Commonly known as: HUMALOG   insulin  NPH-regular Human (70-30) 100 UNIT/ML injection   lidocaine -prilocaine  cream Commonly known as: EMLA    lisinopril  10 MG tablet Commonly known as: ZESTRIL    methocarbamol  500 MG tablet Commonly known as: ROBAXIN    ondansetron  8 MG tablet Commonly known as: ZOFRAN    prochlorperazine  10 MG tablet Commonly known as: COMPAZINE        TAKE these medications    feeding supplement Liqd Take 237 mLs by mouth 2 (two) times daily between meals. What changed: when to take this   gabapentin  300 MG capsule Commonly known as: NEURONTIN  Take 1 capsule (300 mg total) by mouth at bedtime. What changed: when to take this   oxyCODONE  5 MG immediate release tablet Commonly known as: Oxy IR/ROXICODONE  Take 1 tablet (5 mg total) by mouth every 4 (four) hours as needed for moderate pain (pain score 4-6) or severe pain (pain score 7-10) (shortness of breath). What changed:  how much to take reasons to take this Another medication with the same name was removed. Continue taking this medication, and follow the directions you see here.        Not on File  Consultations: Palliative care, IR   Procedures/Studies: DG CHEST PORT 1 VIEW Result Date: 11/01/2023 CLINICAL  DATA:  Pneumothorax. EXAM: PORTABLE CHEST 1 VIEW COMPARISON:  Oct 31, 2023. FINDINGS: Stable cardiomediastinal silhouette. Right internal jugular Port-A-Cath is unchanged. Stable bilateral opacities are noted concerning for multifocal pneumonia. Small left pleural effusion is noted. Stable possible minimal left apical and basilar pneumothorax. Bony thorax is unremarkable. IMPRESSION: Stable bilateral opacities concerning for multifocal pneumonia. Small left pleural effusion. Stable minimal left-sided pneumothorax. Electronically Signed   By: Rosalene Colon M.D.   On: 11/01/2023 12:00   DG CHEST PORT 1 VIEW Result Date: 10/31/2023 CLINICAL DATA:  Pneumothorax EXAM: PORTABLE CHEST 1 VIEW COMPARISON:  10/31/2023 FINDINGS: Small left apical and lateral hydropneumothorax demonstrating a small fluid component is stable. No radiographic evidence of tension physiology. No pneumothorax lumbar right. Small right pleural effusion is stable. Extensive multifocal airspace infiltrate, more severe within the right upper lung zone and perihilar regions, is unchanged in keeping with multifocal infection. Right internal jugular chest port tip is unchanged within the right atrium. Cardiac size within normal limits. IMPRESSION: 1. Stable small left apical and lateral hydropneumothorax. No radiographic evidence of tension physiology. 2. Stable multifocal pulmonary infiltrate. 3. Stable small right pleural effusion and a Electronically Signed   By: Worthy Heads M.D.   On: 10/31/2023 20:25   US  THORACENTESIS ASP PLEURAL SPACE W/IMG GUIDE Result Date: 10/31/2023 INDICATION: 73 year old male with metastatic non-small-cell lung cancer, with new onset shortness of breath, and bilateral pleural effusion, left greater than right. IR was requested for diagnostic and therapeutic thoracentesis. EXAM: ULTRASOUND GUIDED DIAGNOSTIC AND THERAPEUTIC THORACENTESIS MEDICATIONS: 6 cc of 1% lidocaine  COMPLICATIONS: None immediate. PROCEDURE: An  ultrasound guided  thoracentesis was thoroughly discussed with the patient and questions answered. The benefits, risks, alternatives and complications were also discussed. The patient understands and wishes to proceed with the procedure. Written consent was obtained. Ultrasound was performed to localize and mark an adequate pocket of fluid in the left chest. The area was then prepped and draped in the normal sterile fashion. 1% Lidocaine  was used for local anesthesia. Under ultrasound guidance a 6 Fr Safe-T-Centesis catheter was introduced. Thoracentesis was performed. The catheter was removed and a dressing applied. FINDINGS: A total of approximately 1.10 L of clear, straw-colored pleural fluid was removed. Samples were sent to the laboratory as requested by the clinical team. IMPRESSION: Successful ultrasound guided left thoracentesis yielding 1.1 L of pleural fluid. Procedure performed by Lambert Pillion, PA-C Carim, PA-C Electronically Signed   By: Creasie Doctor M.D.   On: 10/31/2023 15:53   DG Chest 1 View Result Date: 10/31/2023 CLINICAL DATA:  Status post thoracentesis EXAM: CHEST  1 VIEW COMPARISON:  Chest x-ray 10/29/2023. FINDINGS: There is a small left-sided pneumothorax which is new from prior. There is a small left pleural effusion which has significantly decreased. Bilateral airspace disease, right greater than left, has not significantly changed. Cardiomediastinal silhouette is within normal limits. Right chest port catheter tip projects over the distal SVC. No acute fractures are identified IMPRESSION: 1. New small left-sided pneumothorax. 2. Small left pleural effusion which has significantly decreased. 3. Stable bilateral airspace disease. Electronically Signed   By: Tyron Gallon M.D.   On: 10/31/2023 15:10   CT Angio Chest Pulmonary Embolism (PE) W or WO Contrast Result Date: 10/31/2023 CLINICAL DATA:  Shortness of breath, low O2 sats. EXAM: CT ANGIOGRAPHY CHEST WITH CONTRAST TECHNIQUE:  Multidetector CT imaging of the chest was performed using the standard protocol during bolus administration of intravenous contrast. Multiplanar CT image reconstructions and MIPs were obtained to evaluate the vascular anatomy. RADIATION DOSE REDUCTION: This exam was performed according to the departmental dose-optimization program which includes automated exposure control, adjustment of the mA and/or kV according to patient size and/or use of iterative reconstruction technique. CONTRAST:  60mL OMNIPAQUE  IOHEXOL  350 MG/ML SOLN COMPARISON:  Chest CT 09/11/2023. FINDINGS: Cardiovascular: No filling defects in the pulmonary arteries to suggest pulmonary emboli. Heart is normal size. Aorta is normal caliber. Right Port-A-Cath remains in place, unchanged. Mediastinum/Nodes: Infiltrating tumor invading the left side of the mediastinum and into the left AP window, progressed since prior study. Worsening mediastinal and bilateral hilar adenopathy. No visible axillary adenopathy. Lungs/Pleura: Large loculated left pleural effusion and moderate right pleural effusion, increasing since prior study. Increasing size and number of metastases throughout the lungs. Central left upper lobe mass now measures 4.8 x 4.1 cm compared to 4.3 x 3.6 cm. Progressive tumor extension and endobronchial spread throughout the left upper lobe with confluent soft tissue thickening again noted along the peribronchovascular bundle, worsening since prior study. Extensive airspace disease throughout the right upper lobe could reflect pneumonia or lymphangitic spread of tumor. Upper Abdomen: No acute findings Musculoskeletal: Soft tissue nodule superficial to xiphoid process measures 2.6 cm, stable. Diffuse osseous metastatic disease again noted, likely slightly progressed since prior study. Review of the MIP images confirms the above findings. IMPRESSION: No evidence of pulmonary embolus. Enlarging left upper lobe mass with worsening spread a along the  peribronchovascular bundle in the left upper lobe. Increasing size and number of pulmonary metastases/nodules. Worsening mediastinal and bilateral hilar adenopathy. Enlarging bilateral pleural effusions, left larger than right. Airspace disease throughout  the right upper lobe could reflect pneumonia or lymphangitic spread of tumor. Diffuse osseous metastatic disease, likely progressed since prior study. Electronically Signed   By: Janeece Mechanic M.D.   On: 10/31/2023 00:11   DG Chest Port 1 View Result Date: 10/30/2023 CLINICAL DATA:  Shortness of breath EXAM: PORTABLE CHEST 1 VIEW COMPARISON:  10/05/2023, CT 09/11/2023 FINDINGS: Right-sided central venous port tip at the cavoatrial junction. Small right-sided pleural effusion, small moderate probably loculated left pleural effusion, increased compared to prior radiograph. Distortion and irregular density in the left upper lobe, perihilar region appears more dense radiographically compared to prior. Persistent dense left lung base consolidation. New or worsened diffuse right-sided pulmonary infiltrates or edema. No pneumothorax IMPRESSION: 1. Small right-sided pleural effusion, small to moderate probably loculated left pleural effusion, increased compared to prior radiograph. 2. New or worsened diffuse right-sided pulmonary infiltrates or edema. 3. Distortion and irregular density in the left upper lobe, perihilar region appears more dense radiographically compared to prior and corresponds to known history of malignancy. Persistent dense left lung base consolidation. Electronically Signed   By: Esmeralda Hedge M.D.   On: 10/30/2023 20:42     Subjective: No acute issues or events overnight denies nausea vomiting diarrhea constipation headache fevers chills or chest pain   Discharge Exam: Vitals:   11/08/23 2111 11/09/23 0606  BP: 108/67 99/63  Pulse: (!) 114 (!) 110  Resp: 20 18  Temp: 99 F (37.2 C) 98.6 F (37 C)  SpO2: 96% 97%   Vitals:    11/07/23 2149 11/08/23 1308 11/08/23 2111 11/09/23 0606  BP: 131/74 105/69 108/67 99/63  Pulse: (!) 102 (!) 113 (!) 114 (!) 110  Resp: 14 20 20 18   Temp: 97.8 F (36.6 C) (!) 97.5 F (36.4 C) 99 F (37.2 C) 98.6 F (37 C)  TempSrc: Oral  Oral   SpO2: 93% 99% 96% 97%  Weight:      Height:        General: Pt is alert, awake, not in acute distress Cardiovascular: RRR, S1/S2 +, no rubs, no gallops Respiratory: CTA bilaterally, no wheezing, no rhonchi Abdominal: Soft, NT, ND, bowel sounds + Extremities: no edema, no cyanosis    The results of significant diagnostics from this hospitalization (including imaging, microbiology, ancillary and laboratory) are listed below for reference.     Microbiology: Recent Results (from the past 240 hours)  Blood culture (routine x 2)     Status: None   Collection Time: 10/30/23  8:40 PM   Specimen: BLOOD  Result Value Ref Range Status   Specimen Description   Final    BLOOD RIGHT PORTA CATH Performed at [facility], [address], [place], [place] [address]    Special Requests   Final    BOTTLES DRAWN AEROBIC AND ANAEROBIC Blood Culture adequate volume Performed at [facility], [address] [place], [place] [address]    Culture   Final    NO GROWTH 5 DAYS Performed at [facility], [address], [place], [place] [address]    Report Status 11/04/2023 FINAL  Final  Blood culture (routine x 2)     Status: None   Collection Time: 10/30/23  8:50 PM   Specimen: BLOOD  Result Value Ref Range Status   Specimen Description   Final    BLOOD BLOOD RIGHT FOREARM Performed at [facility], [address], [place], [place] [address]    Special Requests  Final    BOTTLES DRAWN AEROBIC AND ANAEROBIC Blood Culture adequate volume Performed at [facility], [address] [place], [place] [address]    Culture   Final    NO GROWTH 5 DAYS Performed at [facility], [address], [place], [place] [address]    Report Status 11/04/2023 FINAL  Final  MRSA Next Gen by PCR, Nasal     Status: Abnormal   Collection Time: 10/31/23  6:14 AM   Specimen: Nasal Mucosa; Nasal Swab  Result Value Ref Range Status   MRSA by PCR Next Gen DETECTED (A) NOT DETECTED Final    Comment: (NOTE) The GeneXpert MRSA Assay (FDA approved for NASAL specimens only), is one component of a comprehensive MRSA colonization surveillance program. It is not intended to diagnose MRSA infection nor to guide or monitor treatment for MRSA infections. Test performance is not FDA approved in patients less than 39 years old. Performed at [facility], [address] [place], [place] [address]   Body fluid culture w Gram Stain     Status: None   Collection Time: 10/31/23 12:08 PM   Specimen: PATH Cytology Pleural fluid  Result Value Ref Range Status   Specimen Description   Final    PLEURAL Performed at [facility], [address], [place], [place] [address]    Special Requests   Final    NONE Performed at [facility], [address] [place], [place] [address]    Gram Stain   Final    MODERATE WBC PRESENT, PREDOMINANTLY MONONUCLEAR NO ORGANISMS SEEN    Culture   Final    NO GROWTH 3 DAYS Performed at [facility], [address] [place], [place] [address]    Report Status 11/04/2023 FINAL  Final     Labs: BNP (last 3 results) Recent Labs    10/30/23 1945  BNP 269.3*     Time spent: Over 30 minutes  SIGNED:   Haydee Lipa, DO [facility] 11/09/2023, 8:21 AM Pager   If 7PM-7AM, please contact night-coverage [email or web address]

## 2023-11-09 NOTE — Plan of Care (Signed)
 PMT no charge note.   Chart reviewed TOC note reviewed Discharge summary noted.   No new inpatient PMT specific recommendations at this time.  Recommend outpatient hospice care. Appreciate [facility] hospice liaisons following along.   No charge Lujean Sake MD [place] [facility].

## 2023-11-10 ENCOUNTER — Telehealth: Payer: Self-pay

## 2023-11-10 DIAGNOSIS — J9601 Acute respiratory failure with hypoxia: Secondary | ICD-10-CM | POA: Diagnosis not present

## 2023-11-10 NOTE — Progress Notes (Signed)
 PMT brief progress note Patient noted to be resting in bed BP 94/70 (BP Location: Left Arm)   Pulse (!) 107 Comment: notify to the nurse.  Temp 97.7 F (36.5 C) (Oral)   Resp 18   Ht 6\' 1"  (1.854 m)   Wt 66.8 kg Comment: Taken with bed  SpO2 97%   BMI 19.43 kg/m   Medication history reviewed Patient utilized 3 doses of 5 mg history of oral oxycodone  in the past 24 hours TOC note reviewed Appreciate TOC assistance with finding a safe disposition that includes the addition of hospice services. No new inpatient PMT specific recommendations, no new medication changes for symptom management recommended at this time. Low MDM Lujean Sake MD [facility]

## 2023-11-10 NOTE — TOC Progression Note (Addendum)
 Transition of Care [facility]) - Progression Note    Patient Details  Name: Jonathan Page MRN: [id number] Date of Birth: May 26, 1951  Transition of Care [facility]) CM/SW Contact  Bari Leys, RN Phone Number: 11/10/2023, 8:50 AM  Clinical Narrative:  voice mail received on 11/09/23 from Epifania Haskell with [facility] and Palliative Care , [phone],  reports referral received for hospice care, states may have liaison come to hospital for onsite review.   -8:51am NCM called to [facility] and Palliative care to follow up with Epifania Haskell regarding referral, rep states Brand said referral received and will contact NCM, team notified.   -9:41am Call received from [facility] w/[facility] and Palliative Care, confirmed referral received, NCM provided contact information for patient (patients son) , states she will contact pt's son and call NCM back.  Call received from [facility] with [facility] and Palliative Care, reports she spoke with pt's son, he declined referral stating facilities in [place] and [place] are too far for the significant other to drive, also states he feels patient needs iv pain medicine and is not eating. Jacqlyn Matas information son that there is a Public librarian, Therapist, sports , in [place], son requested Jacqlyn Matas inform this NCM and for this NCM to make a referral.  NCM called to [facility], referral initiated for evaluation for residential hospice. NCM sent teams chat to Lyla Samuels, rep-[facility], requesting update on referral sent yesterday for residential hospice evaluation.   -10:15am Call received from Christiane Cowing with [facility], reports criteria for residential hospice is  prognosis of 6 weeks or less, private pay at $275.00/day with no sliding scale.   -10:43am Teams chat received received from [place] w/[facility] " so my MD does not feel he meets our criteria for inpatient facility".   10:51am Call received from [facility] with [facility], [phone], confirming appeal of discharge, Case# [id number], copy of DC order, IM Letter and Clinical faxed to [facility] at [phone] and copy of DC letter and IM faxed to [facility] at [phone] by [facility] CMA, await determination, team notified.   -11:25am NCM called to pt's son to review discharge planning, NCM reviewed decline of residential hospice by [facility], [facility] and his decline of review for residential hospice by [facility] and Palliative Care, informed NCM outreached to [facility], sw Koren Persons confirmed that residential hospice is private pay at $245/day,  pt's son states that he is seeking hospice facility in the [place] area and that Camilo Cella with [facility] has sent out several referrals to hospice agencies in the [employer] and [place] area. NCM will follow up with Camilo Cella. NCM acknowledged with pt's son Appeal of Discharge and that clinical documentation was faxed to appeal agency per their request this morning, also informed him that if denial is upheld patient will be discharged to home with home hospice that day the upheld denial is received, he did not voice understanding or not understanding.  Son also states that he was informed by [facility] that LTC is covered by patient's insurance, states he will call [employer] to confirm.  -11:31am NCM reached out to [facility],  [place] and [facility] for review for LTC beds, await determination.   -12:10 Call received from [place] with [facility],LTC bed offer made for LTC bed at [facility] or [facility], reports can accept patient as private pay with $345/day approximate cost  with 30 day payment due upfront, [place] confirmed they have their own Hospice and Palliative care,  pt',s son/POA will need to apply for LTC Medicaid, facility will assist him with the Medicaid application process once Medicaid  application initiated, will assist with spend down process. NCM will continue pt's son to present LTC bed offer.   12:36pm Call received from Camilo Cella and Dee Farber, [facility], states they worked with patient's son to secure residential hospice, states son informed them he was willing to accept any residential bed offer  t this time. Camilo Cella states they reached out to [facility] and palliative care rehab Director to request an evaluation. NCM informed Camilo Cella and Jolan Natal that pt's son declined evaluation due to distance of residential hospice facilities([place]). Camilo Cella and Jolan Natal states they reached out to [facility] as well. Camilo Cella states per son, patient owns his townhouse, has no savings, receives SSA but not sure of monthly amount. Reports discission was made with son about private pay as he said he was open to private pay with a payment plan, informed payment plan is not an option. Camilo Cella and Zoila Hines requested updated on outcome of case.   -12:43:  Call to pt's son, reviewed LTC bed offer from [facility], he states that [place] with [employer] is working on option residential hospice options for him and he will contact her , he is agreeable to outreach from [place] at [facility] to review LTC bed offer. NCM sent text with son's contact information to [place] with [facility] for outreach.       -1;47pm Ncm called to [facility] senior leadership, Angelique Barer, Advanced Care Supervisor, updated on case with current status, patient's son has appealed discharge which is pending. [facility] bed offer declined by [facility] and [facility], patient will be self pay at [facility], pt's son declined residential hospice eval from [facility] and Palliative Care. NCM will keep TOC Supervisor updated.   -3:10pm Call received to [facility] CMA from [facility] that the appeal for hospital is upheld. NCM  called to patient's son, informed of denial upheld, informed son that patient stay will be covered until 11 Noon tomorrow. NCM reviewed dc options with son, [place] and [facility] have offered LTC beds private pay. Son states he contacted [facility] and was informed that patient meets criteria for residential hospice. NCM informed son that [facility] hospital representative was contacted, patient's referral was reviewed by [facility] MD and felt patient does not meet criteria for residential hospice.Son was talking over NCM and SW on speaker phone with him. SW again reviewed dc options, accept LTC bed offer from [facility] or [facility] or take patient home tomorrow with Home Hospice. Son responded  "no" in response to taking patient home with home hospice, states he wants to speak with Dr Rudine Cos about why patient was taken off iv pain medications,  he continued to talk over NCM and SW, he accused SW of being rude and unprofessional, stated he was going to call Dr Rudine Cos and hung up phone.   -3:58PM SW outreached patient's son to review HINN 12. Son states that he is working with Chemical engineer for placement at [facility], NCM will follow up.   -4:00pm NCM called to [employer] at [employer], [place] confirmed referral was sent to [facility], reports facility works  with [facility] for hospice services, states will be private pay at $6500/month. Contact: Rexann Catalan, [phone].  Expected Discharge Plan: Home w Hospice Care Barriers to Discharge: Continued Medical Work up  Expected Discharge Plan and Services In-house Referral: Clinical Social Work   Post Acute Care Choice: NA Living arrangements for the past 2 months: [facility]illed [facility] Healthcare) Expected Discharge Date: 11/09/23               DME Arranged: N/A DME Agency: NA       HH Arranged: NA           Social Determinants of Health (SDOH) Interventions SDOH Screenings   Food Insecurity: No Food Insecurity (10/31/2023)  Housing: Low Risk  (10/31/2023)  Transportation Needs: No Transportation Needs (10/31/2023)   Utilities: Not At Risk (10/31/2023)  Depression (PHQ2-9): Low Risk  (03/16/2023)  Social Connections: Unknown (10/31/2023)  Tobacco Use: Medium Risk (10/30/2023)    Readmission Risk Interventions    10/31/2023   10:28 AM  Readmission Risk Prevention Plan  Transportation Screening Complete  Medication Review (RN Care Manager) Complete  PCP or Specialist appointment within 3-5 days of discharge Complete  HRI or Home Care Consult Complete  SW Recovery Care/Counseling Consult Complete  Palliative Care Screening Not Complete  Comments TBD  Skilled Nursing Facility Not Complete  SNF Comments TBD

## 2023-11-10 NOTE — TOC Progression Note (Signed)
 Transition of Care [facility]) - Progression Note    Patient Details  Name: Jonathan Page MRN: [id number] Date of Birth: 25-Dec-1950  Transition of Care [facility]) CM/SW Contact  Marty Sleet, LCSW Phone Number: 11/10/2023, 4:05 PM  Clinical Narrative:    Verbally reviewed DND and HINN 12 with pt's son, Almer Bushey via t/c at [phone].   Expected Discharge Plan: Home w Hospice Care Barriers to Discharge: Continued Medical Work up  Expected Discharge Plan and Services In-house Referral: Clinical Social Work   Post Acute Care Choice: NA Living arrangements for the past 2 months: Skilled Science writer Healthcare) Expected Discharge Date: 11/09/23               DME Arranged: N/A DME Agency: NA       HH Arranged: NA           Social Determinants of Health (SDOH) Interventions SDOH Screenings   Food Insecurity: No Food Insecurity (10/31/2023)  Housing: Low Risk  (10/31/2023)  Transportation Needs: No Transportation Needs (10/31/2023)  Utilities: Not At Risk (10/31/2023)  Depression (PHQ2-9): Low Risk  (03/16/2023)  Social Connections: Unknown (10/31/2023)  Tobacco Use: Medium Risk (10/30/2023)    Readmission Risk Interventions    10/31/2023   10:28 AM  Readmission Risk Prevention Plan  Transportation Screening Complete  Medication Review (RN Care Manager) Complete  PCP or Specialist appointment within 3-5 days of discharge Complete  HRI or Home Care Consult Complete  SW Recovery Care/Counseling Consult Complete  Palliative Care Screening Not Complete  Comments TBD  Skilled Nursing Facility Not Complete  SNF Comments TBD

## 2023-11-10 NOTE — Telephone Encounter (Signed)
 Received VM from patients son, [name] stating that he had a couple questions in regards to his fathers medical condition.    Tried to contact patients son, Ahamd.  LVM for a return call.

## 2023-11-10 NOTE — Discharge Summary (Signed)
 Physician Discharge Summary  Jonathan Page ZOX:[id number] DOB: [birth year] DOA: 10/30/2023  PCP: [name], NP  Admit date: 10/30/2023 Discharge date: 11/10/2023  Admitted From: SNF Disposition: To be determined  Recommendations for Outpatient Follow-up:  Follow up with hospice team as scheduled:  Discharge Condition: Poor CODE STATUS: DNR Diet recommendation: Comfort feeding as tolerated  Brief/Interim Summary: 73 year old man PMH non-small cell lung cancer metastatic to bone, progressive despite second line therapy, presented with shortness of breath. CT showed progression of disease, patient admitted for new hypoxia with respiratory failure, further evaluation. Treated with antibiotics, underwent thoracentesis with small postprocedure pneumothorax which is stable. Pulmonology recommended conservative management. Seen by oncology with recommendation for no further chemotherapy. Seen by palliative care for ultimately patient elected comfort care. Family declined available hospice bed, now not inpatient hospice eligible.   TOC secured safe dispo at [facility].  Son has declined offer to admit based on location as it is " too far away." Patient is discharged from a medical standpoint, son is apparently appealing discharge despite having a safe disposition.  I am worried that patient's family will decline placement at hospice and he will be forced to discharge home where support will be limited.  Discharge Diagnoses:  Principal Problem:   Acute respiratory failure with hypoxia (HCC) Active Problems:   Pleural effusion, bilateral   Iron  deficiency anemia   Essential hypertension   Non-small cell lung cancer metastatic to bone (HCC)   Thrombocytopenia (HCC)   Uncontrolled type 2 diabetes mellitus with hyperglycemia, without long-term current use of insulin  (HCC)   AKI (acute kidney injury) (HCC)   Hypoglycemia   Pneumothorax   Pressure injury of skin   Malignant  pleural effusion   DNR (do not resuscitate)   Cancer associated pain   Need for emotional support   Counseling and coordination of care   Acute hypoxic respiratory failure (HCC)   Palliative care encounter   High risk medication use   Concern about end of life   Medication management   Goals of care, counseling/discussion  Acute respiratory failure with hypoxia (HCC) Malignant left pleural effusion Right pleural effusion Non-small cell lung cancer metastatic to bone (HCC) Small left-sided pneumothorax status postthoracentesis Admitted for acute hypoxic respiratory failure, now on oxygen.  Plan pulm on oxygen.  Likely secondary to progression of disease but infection could not be ruled out.  Treated empirically with antibiotics. CT no PE.  Enlarging left upper lobe mass with worsening spread, increasing size and number of pulmonary metastases and nodules, enlarging bilateral pleural effusions, right upper lobe airspace disease pneumonia versus lymphangitic spread, diffuse osseous metastatic disease. Small pneumothorax status postthoracentesis.  Pulmonology recommended conservative management if repeat chest x-ray stable.  Repeat chest x-ray showed stable left apical and lateral hydropneumothorax, no evidence of tension physiology.  Continue conservative management. Per oncology not a candidate for further oncologic intervention. Comfort care     Iron  deficiency anemia Thrombocytopenia (HCC) Anemia likely multifactorial secondary to malignancy, recent chemotherapy, poor nutritional status  No further monitoring   AKI (acute kidney injury) (HCC) NAGMA No further monitoring   Uncontrolled type 2 diabetes mellitus with hyperglycemia, without long-term current use of insulin  (HCC) Neuropathy Hypoglycemia Glucose low normal.  Hypoglycemia resolved with improving appetite. Holding glipizide , insulin . Continue gabapentin .   PMH SAH one month ago Pressure injury to sacrum stage 3,  present on admission -continue wound care  Underweight  Body mass index is 18.47 kg/m.    Disposition From: [facility] To: Residential hospice was planned, but disposition is now unclear  Discharge Instructions   Allergies as of 11/10/2023   Not on File      Medication List     STOP taking these medications    Accu-Chek Aviva Plus test strip Generic drug: glucose blood   Accu-Chek Aviva Plus w/Device Kit   accu-chek soft touch lancets   Blood Glucose Monitoring Suppl Devi   dexamethasone  4 MG tablet Commonly known as: DECADRON    diclofenac  Sodium 1 % Gel Commonly known as: VOLTAREN    docusate sodium  100 MG capsule Commonly known as: COLACE   folic acid  1 MG tablet Commonly known as: FOLVITE    glipiZIDE  10 MG 24 hr tablet Commonly known as: GLUCOTROL  XL   insulin  lispro 100 UNIT/ML injection Commonly known as: HUMALOG   insulin  NPH-regular Human (70-30) 100 UNIT/ML injection   lidocaine -prilocaine  cream Commonly known as: EMLA    lisinopril  10 MG tablet Commonly known as: ZESTRIL    methocarbamol  500 MG tablet Commonly known as: ROBAXIN    ondansetron  8 MG tablet Commonly known as: ZOFRAN    prochlorperazine  10 MG tablet Commonly known as: COMPAZINE        TAKE these medications    feeding supplement Liqd Take 237 mLs by mouth 2 (two) times daily between meals. What changed: when to take this   gabapentin  300 MG capsule Commonly known as: NEURONTIN  Take 1 capsule (300 mg total) by mouth at bedtime. What changed: when to take this   oxyCODONE  5 MG immediate release tablet Commonly known as: Oxy IR/ROXICODONE  Take 1 tablet (5 mg total) by mouth every 4 (four) hours as needed for moderate pain (pain score 4-6) or severe pain (pain score 7-10) (shortness of breath). What changed:  how much to take reasons to take this Another medication with the same name was removed. Continue taking this medication, and follow the  directions you see here.        Not on File  Consultations: Palliative care, IR   Procedures/Studies: DG CHEST PORT 1 VIEW Result Date: 11/01/2023 CLINICAL DATA:  Pneumothorax. EXAM: PORTABLE CHEST 1 VIEW COMPARISON:  Oct 31, 2023. FINDINGS: Stable cardiomediastinal silhouette. Right internal jugular Port-A-Cath is unchanged. Stable bilateral opacities are noted concerning for multifocal pneumonia. Small left pleural effusion is noted. Stable possible minimal left apical and basilar pneumothorax. Bony thorax is unremarkable. IMPRESSION: Stable bilateral opacities concerning for multifocal pneumonia. Small left pleural effusion. Stable minimal left-sided pneumothorax. Electronically Signed   By: Rosalene Colon M.D.   On: 11/01/2023 12:00   DG CHEST PORT 1 VIEW Result Date: 10/31/2023 CLINICAL DATA:  Pneumothorax EXAM: PORTABLE CHEST 1 VIEW COMPARISON:  10/31/2023 FINDINGS: Small left apical and lateral hydropneumothorax demonstrating a small fluid component is stable. No radiographic evidence of tension physiology. No pneumothorax lumbar right. Small right pleural effusion is stable. Extensive multifocal airspace infiltrate, more severe within the right upper lung zone and perihilar regions, is unchanged in keeping with multifocal infection. Right internal jugular chest port tip is unchanged within the right atrium. Cardiac size within normal limits. IMPRESSION: 1. Stable small left apical and lateral hydropneumothorax. No radiographic evidence of tension physiology. 2. Stable multifocal pulmonary infiltrate. 3. Stable small right pleural effusion and a Electronically Signed   By: Worthy Heads M.D.   On: 10/31/2023 20:25   US  THORACENTESIS ASP PLEURAL SPACE W/IMG GUIDE Result Date: 10/31/2023 INDICATION: 73 year old male with metastatic non-small-cell lung cancer, with new onset shortness of breath, and bilateral pleural effusion,  left greater than right. IR was requested for diagnostic and  therapeutic thoracentesis. EXAM: ULTRASOUND GUIDED DIAGNOSTIC AND THERAPEUTIC THORACENTESIS MEDICATIONS: 6 cc of 1% lidocaine  COMPLICATIONS: None immediate. PROCEDURE: An ultrasound guided thoracentesis was thoroughly discussed with the patient and questions answered. The benefits, risks, alternatives and complications were also discussed. The patient understands and wishes to proceed with the procedure. Written consent was obtained. Ultrasound was performed to localize and mark an adequate pocket of fluid in the left chest. The area was then prepped and draped in the normal sterile fashion. 1% Lidocaine  was used for local anesthesia. Under ultrasound guidance a 6 Fr Safe-T-Centesis catheter was introduced. Thoracentesis was performed. The catheter was removed and a dressing applied. FINDINGS: A total of approximately 1.10 L of clear, straw-colored pleural fluid was removed. Samples were sent to the laboratory as requested by the clinical team. IMPRESSION: Successful ultrasound guided left thoracentesis yielding 1.1 L of pleural fluid. Procedure performed by Lambert Pillion, PA-C Carim, PA-C Electronically Signed   By: Creasie Doctor M.D.   On: 10/31/2023 15:53   DG Chest 1 View Result Date: 10/31/2023 CLINICAL DATA:  Status post thoracentesis EXAM: CHEST  1 VIEW COMPARISON:  Chest x-ray 10/29/2023. FINDINGS: There is a small left-sided pneumothorax which is new from prior. There is a small left pleural effusion which has significantly decreased. Bilateral airspace disease, right greater than left, has not significantly changed. Cardiomediastinal silhouette is within normal limits. Right chest port catheter tip projects over the distal SVC. No acute fractures are identified IMPRESSION: 1. New small left-sided pneumothorax. 2. Small left pleural effusion which has significantly decreased. 3. Stable bilateral airspace disease. Electronically Signed   By: Tyron Gallon M.D.   On: 10/31/2023 15:10   CT Angio Chest  Pulmonary Embolism (PE) W or WO Contrast Result Date: 10/31/2023 CLINICAL DATA:  Shortness of breath, low O2 sats. EXAM: CT ANGIOGRAPHY CHEST WITH CONTRAST TECHNIQUE: Multidetector CT imaging of the chest was performed using the standard protocol during bolus administration of intravenous contrast. Multiplanar CT image reconstructions and MIPs were obtained to evaluate the vascular anatomy. RADIATION DOSE REDUCTION: This exam was performed according to the departmental dose-optimization program which includes automated exposure control, adjustment of the mA and/or kV according to patient size and/or use of iterative reconstruction technique. CONTRAST:  60mL OMNIPAQUE  IOHEXOL  350 MG/ML SOLN COMPARISON:  Chest CT 09/11/2023. FINDINGS: Cardiovascular: No filling defects in the pulmonary arteries to suggest pulmonary emboli. Heart is normal size. Aorta is normal caliber. Right Port-A-Cath remains in place, unchanged. Mediastinum/Nodes: Infiltrating tumor invading the left side of the mediastinum and into the left AP window, progressed since prior study. Worsening mediastinal and bilateral hilar adenopathy. No visible axillary adenopathy. Lungs/Pleura: Large loculated left pleural effusion and moderate right pleural effusion, increasing since prior study. Increasing size and number of metastases throughout the lungs. Central left upper lobe mass now measures 4.8 x 4.1 cm compared to 4.3 x 3.6 cm. Progressive tumor extension and endobronchial spread throughout the left upper lobe with confluent soft tissue thickening again noted along the peribronchovascular bundle, worsening since prior study. Extensive airspace disease throughout the right upper lobe could reflect pneumonia or lymphangitic spread of tumor. Upper Abdomen: No acute findings Musculoskeletal: Soft tissue nodule superficial to xiphoid process measures 2.6 cm, stable. Diffuse osseous metastatic disease again noted, likely slightly progressed since prior  study. Review of the MIP images confirms the above findings. IMPRESSION: No evidence of pulmonary embolus. Enlarging left upper lobe mass with worsening spread a  along the peribronchovascular bundle in the left upper lobe. Increasing size and number of pulmonary metastases/nodules. Worsening mediastinal and bilateral hilar adenopathy. Enlarging bilateral pleural effusions, left larger than right. Airspace disease throughout the right upper lobe could reflect pneumonia or lymphangitic spread of tumor. Diffuse osseous metastatic disease, likely progressed since prior study. Electronically Signed   By: Janeece Mechanic M.D.   On: 10/31/2023 00:11   DG Chest Port 1 View Result Date: 10/30/2023 CLINICAL DATA:  Shortness of breath EXAM: PORTABLE CHEST 1 VIEW COMPARISON:  10/05/2023, CT 09/11/2023 FINDINGS: Right-sided central venous port tip at the cavoatrial junction. Small right-sided pleural effusion, small moderate probably loculated left pleural effusion, increased compared to prior radiograph. Distortion and irregular density in the left upper lobe, perihilar region appears more dense radiographically compared to prior. Persistent dense left lung base consolidation. New or worsened diffuse right-sided pulmonary infiltrates or edema. No pneumothorax IMPRESSION: 1. Small right-sided pleural effusion, small to moderate probably loculated left pleural effusion, increased compared to prior radiograph. 2. New or worsened diffuse right-sided pulmonary infiltrates or edema. 3. Distortion and irregular density in the left upper lobe, perihilar region appears more dense radiographically compared to prior and corresponds to known history of malignancy. Persistent dense left lung base consolidation. Electronically Signed   By: Esmeralda Hedge M.D.   On: 10/30/2023 20:42     Subjective: No acute issues or events overnight denies nausea vomiting diarrhea constipation headache fevers chills or chest pain   Discharge  Exam: Vitals:   11/09/23 2228 11/10/23 1140  BP: 95/66 94/70  Pulse: (!) 101 (!) 107  Resp: 14 18  Temp: 98.2 F (36.8 C) 97.7 F (36.5 C)  SpO2: 98% 97%   Vitals:   11/08/23 2111 11/09/23 0606 11/09/23 2228 11/10/23 1140  BP: 108/67 99/63 95/66  94/70  Pulse: (!) 114 (!) 110 (!) 101 (!) 107  Resp: 20 18 14 18   Temp: 99 F (37.2 C) 98.6 F (37 C) 98.2 F (36.8 C) 97.7 F (36.5 C)  TempSrc: Oral  Oral Oral  SpO2: 96% 97% 98% 97%  Weight:      Height:        General: Pt is alert, awake, not in acute distress Cardiovascular: RRR, S1/S2 +, no rubs, no gallops Respiratory: CTA bilaterally, no wheezing, no rhonchi Abdominal: Soft, NT, ND, bowel sounds + Extremities: no edema, no cyanosis   The results of significant diagnostics from this hospitalization (including imaging, microbiology, ancillary and laboratory) are listed below for reference.     Microbiology: No results found for this or any previous visit (from the past 240 hours).    Labs: BNP (last 3 results) Recent Labs    10/30/23 1945  BNP 269.3*     Time spent: Over 30 minutes  SIGNED:   Haydee Lipa, DO [facility] 11/10/2023, 1:31 PM Pager   If 7PM-7AM, please contact night-coverage [email or web address]

## 2023-11-10 NOTE — Telephone Encounter (Signed)
 Spoke with patient's son regarding medical concerns. Son states that his father is still in the hospital and called to discuss discharge planning with hospice. He understands that his father does not need to be seen in Dr. Bing Buff office and appts will be canceled. Son verbalized understanding and was advised to call with any further concerns or questions.

## 2023-11-11 ENCOUNTER — Ambulatory Visit

## 2023-11-11 DIAGNOSIS — J9601 Acute respiratory failure with hypoxia: Secondary | ICD-10-CM | POA: Diagnosis not present

## 2023-11-11 NOTE — Progress Notes (Signed)
 Physician Discharge Summary  Jonathan Page AVW:[id number] DOB: 24-Feb-1951 DOA: 10/30/2023  PCP: [name], NP  Admit date: 10/30/2023 Discharge date: 11/11/2023  Admitted From: SNF Disposition: To be determined  Recommendations for Outpatient Follow-up:  Follow up with hospice team as scheduled:  Discharge Condition: Poor CODE STATUS: DNR Diet recommendation: Comfort feeding as tolerated  Brief/Interim Summary: 73 year old man PMH non-small cell lung cancer metastatic to bone, progressive despite second line therapy, presented with shortness of breath. CT showed progression of disease, patient admitted for new hypoxia with respiratory failure, further evaluation. Treated with antibiotics, underwent thoracentesis with small postprocedure pneumothorax which is stable. Pulmonology recommended conservative management. Seen by oncology with recommendation for no further chemotherapy. Seen by palliative care for ultimately patient elected comfort care. Family declined available hospice bed, now not inpatient hospice eligible.   TOC secured safe dispo at [facility].  Son has declined offer to admit based on location as it is " too far away." Patient is discharged from a medical standpoint, son is apparently appealing discharge despite having a safe disposition.  I am worried that patient's family will continue to decline placement at facility and he will end up discharging home without appropriate support.  Discharge Diagnoses:  Principal Problem:   Acute respiratory failure with hypoxia (HCC) Active Problems:   Pleural effusion, bilateral   Iron  deficiency anemia   Essential hypertension   Non-small cell lung cancer metastatic to bone (HCC)   Thrombocytopenia (HCC)   Uncontrolled type 2 diabetes mellitus with hyperglycemia, without long-term current use of insulin  (HCC)   AKI (acute kidney injury) (HCC)   Hypoglycemia   Pneumothorax   Pressure injury of skin    Malignant pleural effusion   DNR (do not resuscitate)   Cancer associated pain   Need for emotional support   Counseling and coordination of care   Acute hypoxic respiratory failure (HCC)   Palliative care encounter   High risk medication use   Concern about end of life   Medication management   Goals of care, counseling/discussion  Acute respiratory failure with hypoxia (HCC) Malignant left pleural effusion Right pleural effusion Non-small cell lung cancer metastatic to bone (HCC) Small left-sided pneumothorax status postthoracentesis Admitted for acute hypoxic respiratory failure, now on oxygen.  Plan pulm on oxygen.  Likely secondary to progression of disease but infection could not be ruled out.  Treated empirically with antibiotics. CT no PE.  Enlarging left upper lobe mass with worsening spread, increasing size and number of pulmonary metastases and nodules, enlarging bilateral pleural effusions, right upper lobe airspace disease pneumonia versus lymphangitic spread, diffuse osseous metastatic disease. Small pneumothorax status postthoracentesis.  Pulmonology recommended conservative management if repeat chest x-ray stable.  Repeat chest x-ray showed stable left apical and lateral hydropneumothorax, no evidence of tension physiology.  Continue conservative management. Per oncology not a candidate for further oncologic intervention. Comfort care     Iron  deficiency anemia Thrombocytopenia (HCC) Anemia likely multifactorial secondary to malignancy, recent chemotherapy, poor nutritional status  No further monitoring   AKI (acute kidney injury) (HCC) NAGMA No further monitoring   Uncontrolled type 2 diabetes mellitus with hyperglycemia, without long-term current use of insulin  (HCC) Neuropathy Hypoglycemia Glucose low normal.  Hypoglycemia resolved with improving appetite. Holding glipizide , insulin . Continue gabapentin .   PMH SAH one month ago Pressure injury to sacrum  stage 3, present on admission -continue wound care  Underweight  Body mass index is 18.47 kg/m.    Disposition From: [facility]  Facility SNF To: Residential hospice was planned, but disposition is now unclear  Discharge Instructions   Allergies as of 11/11/2023   Not on File      Medication List     STOP taking these medications    Accu-Chek Aviva Plus test strip Generic drug: glucose blood   Accu-Chek Aviva Plus w/Device Kit   accu-chek soft touch lancets   Blood Glucose Monitoring Suppl Devi   dexamethasone  4 MG tablet Commonly known as: DECADRON    diclofenac  Sodium 1 % Gel Commonly known as: VOLTAREN    docusate sodium  100 MG capsule Commonly known as: COLACE   folic acid  1 MG tablet Commonly known as: FOLVITE    glipiZIDE  10 MG 24 hr tablet Commonly known as: GLUCOTROL  XL   insulin  lispro 100 UNIT/ML injection Commonly known as: HUMALOG   insulin  NPH-regular Human (70-30) 100 UNIT/ML injection   lidocaine -prilocaine  cream Commonly known as: EMLA    lisinopril  10 MG tablet Commonly known as: ZESTRIL    methocarbamol  500 MG tablet Commonly known as: ROBAXIN    ondansetron  8 MG tablet Commonly known as: ZOFRAN    prochlorperazine  10 MG tablet Commonly known as: COMPAZINE        TAKE these medications    feeding supplement Liqd Take 237 mLs by mouth 2 (two) times daily between meals. What changed: when to take this   gabapentin  300 MG capsule Commonly known as: NEURONTIN  Take 1 capsule (300 mg total) by mouth at bedtime. What changed: when to take this   oxyCODONE  5 MG immediate release tablet Commonly known as: Oxy IR/ROXICODONE  Take 1 tablet (5 mg total) by mouth every 4 (four) hours as needed for moderate pain (pain score 4-6) or severe pain (pain score 7-10) (shortness of breath). What changed:  how much to take reasons to take this Another medication with the same name was removed. Continue taking this medication, and follow  the directions you see here.        Not on File  Consultations: Palliative care, IR   Procedures/Studies: DG CHEST PORT 1 VIEW Result Date: 11/01/2023 CLINICAL DATA:  Pneumothorax. EXAM: PORTABLE CHEST 1 VIEW COMPARISON:  Oct 31, 2023. FINDINGS: Stable cardiomediastinal silhouette. Right internal jugular Port-A-Cath is unchanged. Stable bilateral opacities are noted concerning for multifocal pneumonia. Small left pleural effusion is noted. Stable possible minimal left apical and basilar pneumothorax. Bony thorax is unremarkable. IMPRESSION: Stable bilateral opacities concerning for multifocal pneumonia. Small left pleural effusion. Stable minimal left-sided pneumothorax. Electronically Signed   By: Rosalene Colon M.D.   On: 11/01/2023 12:00   DG CHEST PORT 1 VIEW Result Date: 10/31/2023 CLINICAL DATA:  Pneumothorax EXAM: PORTABLE CHEST 1 VIEW COMPARISON:  10/31/2023 FINDINGS: Small left apical and lateral hydropneumothorax demonstrating a small fluid component is stable. No radiographic evidence of tension physiology. No pneumothorax lumbar right. Small right pleural effusion is stable. Extensive multifocal airspace infiltrate, more severe within the right upper lung zone and perihilar regions, is unchanged in keeping with multifocal infection. Right internal jugular chest port tip is unchanged within the right atrium. Cardiac size within normal limits. IMPRESSION: 1. Stable small left apical and lateral hydropneumothorax. No radiographic evidence of tension physiology. 2. Stable multifocal pulmonary infiltrate. 3. Stable small right pleural effusion and a Electronically Signed   By: Worthy Heads M.D.   On: 10/31/2023 20:25   US  THORACENTESIS ASP PLEURAL SPACE W/IMG GUIDE Result Date: 10/31/2023 INDICATION: 73 year old male with metastatic non-small-cell lung cancer, with new onset shortness of breath, and bilateral pleural effusion, left  greater than right. IR was requested for diagnostic and  therapeutic thoracentesis. EXAM: ULTRASOUND GUIDED DIAGNOSTIC AND THERAPEUTIC THORACENTESIS MEDICATIONS: 6 cc of 1% lidocaine  COMPLICATIONS: None immediate. PROCEDURE: An ultrasound guided thoracentesis was thoroughly discussed with the patient and questions answered. The benefits, risks, alternatives and complications were also discussed. The patient understands and wishes to proceed with the procedure. Written consent was obtained. Ultrasound was performed to localize and mark an adequate pocket of fluid in the left chest. The area was then prepped and draped in the normal sterile fashion. 1% Lidocaine  was used for local anesthesia. Under ultrasound guidance a 6 Fr Safe-T-Centesis catheter was introduced. Thoracentesis was performed. The catheter was removed and a dressing applied. FINDINGS: A total of approximately 1.10 L of clear, straw-colored pleural fluid was removed. Samples were sent to the laboratory as requested by the clinical team. IMPRESSION: Successful ultrasound guided left thoracentesis yielding 1.1 L of pleural fluid. Procedure performed by Lambert Pillion, PA-C Carim, PA-C Electronically Signed   By: Creasie Doctor M.D.   On: 10/31/2023 15:53   DG Chest 1 View Result Date: 10/31/2023 CLINICAL DATA:  Status post thoracentesis EXAM: CHEST  1 VIEW COMPARISON:  Chest x-ray 10/29/2023. FINDINGS: There is a small left-sided pneumothorax which is new from prior. There is a small left pleural effusion which has significantly decreased. Bilateral airspace disease, right greater than left, has not significantly changed. Cardiomediastinal silhouette is within normal limits. Right chest port catheter tip projects over the distal SVC. No acute fractures are identified IMPRESSION: 1. New small left-sided pneumothorax. 2. Small left pleural effusion which has significantly decreased. 3. Stable bilateral airspace disease. Electronically Signed   By: Tyron Gallon M.D.   On: 10/31/2023 15:10   CT Angio Chest  Pulmonary Embolism (PE) W or WO Contrast Result Date: 10/31/2023 CLINICAL DATA:  Shortness of breath, low O2 sats. EXAM: CT ANGIOGRAPHY CHEST WITH CONTRAST TECHNIQUE: Multidetector CT imaging of the chest was performed using the standard protocol during bolus administration of intravenous contrast. Multiplanar CT image reconstructions and MIPs were obtained to evaluate the vascular anatomy. RADIATION DOSE REDUCTION: This exam was performed according to the departmental dose-optimization program which includes automated exposure control, adjustment of the mA and/or kV according to patient size and/or use of iterative reconstruction technique. CONTRAST:  60mL OMNIPAQUE  IOHEXOL  350 MG/ML SOLN COMPARISON:  Chest CT 09/11/2023. FINDINGS: Cardiovascular: No filling defects in the pulmonary arteries to suggest pulmonary emboli. Heart is normal size. Aorta is normal caliber. Right Port-A-Cath remains in place, unchanged. Mediastinum/Nodes: Infiltrating tumor invading the left side of the mediastinum and into the left AP window, progressed since prior study. Worsening mediastinal and bilateral hilar adenopathy. No visible axillary adenopathy. Lungs/Pleura: Large loculated left pleural effusion and moderate right pleural effusion, increasing since prior study. Increasing size and number of metastases throughout the lungs. Central left upper lobe mass now measures 4.8 x 4.1 cm compared to 4.3 x 3.6 cm. Progressive tumor extension and endobronchial spread throughout the left upper lobe with confluent soft tissue thickening again noted along the peribronchovascular bundle, worsening since prior study. Extensive airspace disease throughout the right upper lobe could reflect pneumonia or lymphangitic spread of tumor. Upper Abdomen: No acute findings Musculoskeletal: Soft tissue nodule superficial to xiphoid process measures 2.6 cm, stable. Diffuse osseous metastatic disease again noted, likely slightly progressed since prior  study. Review of the MIP images confirms the above findings. IMPRESSION: No evidence of pulmonary embolus. Enlarging left upper lobe mass with worsening spread a along  the peribronchovascular bundle in the left upper lobe. Increasing size and number of pulmonary metastases/nodules. Worsening mediastinal and bilateral hilar adenopathy. Enlarging bilateral pleural effusions, left larger than right. Airspace disease throughout the right upper lobe could reflect pneumonia or lymphangitic spread of tumor. Diffuse osseous metastatic disease, likely progressed since prior study. Electronically Signed   By: Janeece Mechanic M.D.   On: 10/31/2023 00:11   DG Chest Port 1 View Result Date: 10/30/2023 CLINICAL DATA:  Shortness of breath EXAM: PORTABLE CHEST 1 VIEW COMPARISON:  10/05/2023, CT 09/11/2023 FINDINGS: Right-sided central venous port tip at the cavoatrial junction. Small right-sided pleural effusion, small moderate probably loculated left pleural effusion, increased compared to prior radiograph. Distortion and irregular density in the left upper lobe, perihilar region appears more dense radiographically compared to prior. Persistent dense left lung base consolidation. New or worsened diffuse right-sided pulmonary infiltrates or edema. No pneumothorax IMPRESSION: 1. Small right-sided pleural effusion, small to moderate probably loculated left pleural effusion, increased compared to prior radiograph. 2. New or worsened diffuse right-sided pulmonary infiltrates or edema. 3. Distortion and irregular density in the left upper lobe, perihilar region appears more dense radiographically compared to prior and corresponds to known history of malignancy. Persistent dense left lung base consolidation. Electronically Signed   By: Esmeralda Hedge M.D.   On: 10/30/2023 20:42     Subjective: No acute issues or events overnight denies nausea vomiting diarrhea constipation headache fevers chills or chest pain   Discharge  Exam: Vitals:   11/10/23 1140 11/10/23 2115  BP: 94/70 125/80  Pulse: (!) 107   Resp: 18 15  Temp: 97.7 F (36.5 C) 97.8 F (36.6 C)  SpO2: 97% 93%   Vitals:   11/09/23 0606 11/09/23 2228 11/10/23 1140 11/10/23 2115  BP: 99/63 95/66 94/70  125/80  Pulse: (!) 110 (!) 101 (!) 107   Resp: 18 14 18 15   Temp: 98.6 F (37 C) 98.2 F (36.8 C) 97.7 F (36.5 C) 97.8 F (36.6 C)  TempSrc:  Oral Oral Oral  SpO2: 97% 98% 97% 93%  Weight:      Height:        General: Pt is alert, awake, not in acute distress Cardiovascular: RRR, S1/S2 +, no rubs, no gallops Respiratory: CTA bilaterally, no wheezing, no rhonchi Abdominal: Soft, NT, ND, bowel sounds + Extremities: no edema, no cyanosis   The results of significant diagnostics from this hospitalization (including imaging, microbiology, ancillary and laboratory) are listed below for reference.     Microbiology: No results found for this or any previous visit (from the past 240 hours).    Labs: BNP (last 3 results) Recent Labs    10/30/23 1945  BNP 269.3*     Time spent: Over 30 minutes  SIGNED:   Haydee Lipa, DO [facility] 11/11/2023, 7:47 AM Pager   If 7PM-7AM, please contact night-coverage [email or web address]

## 2023-11-11 NOTE — Plan of Care (Signed)
  Problem: Clinical Measurements: Goal: Ability to maintain clinical measurements within normal limits will improve Outcome: Progressing Goal: Will remain free from infection Outcome: Progressing   Problem: Nutrition: Goal: Adequate nutrition will be maintained Outcome: Progressing   Problem: Pain Managment: Goal: General experience of comfort will improve and/or be controlled Outcome: Progressing   Problem: Clinical Measurements: Goal: Quality of life will improve Outcome: Progressing

## 2023-11-11 NOTE — TOC Progression Note (Addendum)
 Transition of Care [facility]) - Progression Note    Patient Details  Name: Jonathan Page MRN: [id number] Date of Birth: Nov 17, 1950  Transition of Care [facility]) CM/SW Contact  Levie Ream, RN Phone Number: 11/11/2023, 11:41 AM  Clinical Narrative:    Eldora Greet w/ pt's son Peggy Monk regarding pt's d/c; he says per Camilo Cella at [place] [phone]) is sending someone to eval pt in hospital today, and they will arrange transportation; spoke w/ Debe Falco [phone]); she will call this RN, CM and give update on status of eval/admit; awaiting return call.  -1417- no return call; LVM for Debe Falco [phone]); also spoke [place] at [facility] and LVM for Ms Ace Abu; awaiting return call   -1549- per Verlyn Goad, RN pt has not been evaluated by facility today; discussed case w/ Letta Raw, [facility] director; he was updated on status of case;  Gladys Lamp says he will discuss w/ Mansfield Seip and follow up w/ RN, CM tomorrow.  -4401- per Catharine Clock, TOC Supervisor, UM Advisor who states patient son will be responsible for the financial dynamic of continued hospital stay but he did recommend that we request palliative to come back and/or have conversation with patient son regarding his desires for next step; spoke w/ Dr Rudine Cos; he says orders for [facility] not needed; advised to follow up w/ pt's son  -12- spoke w/ pt's son [name]; he was notified that pt has not been evaluated by facility, and attempts to discuss status w/ facility rep has been unsuccessful; Mr Chura says he was previously told that pt did not qualify for residential hospice b/c he was on still on PO meds; Mr Bradsher says he has reached out to [facility] today, and [facility]; he also says he was told by [name] at [facility] that she would evaluate pt weekly to see if pt qualifies for bed at [facility]; Mr Volker says pt's SO is not able to take him home w/ hospice; his  goal is to find a safe place for pt to transition peacefully; Dr Rudine Cos notified via secure chat; [facility] will follow up w/ agencies.  Expected Discharge Plan: Home w Hospice Care Barriers to Discharge: Continued Medical Work up  Expected Discharge Plan and Services In-house Referral: Clinical Social Work   Post Acute Care Choice: NA Living arrangements for the past 2 months: [facility]) Expected Discharge Date: 11/09/23               DME Arranged: N/A DME Agency: NA       HH Arranged: NA           Social Determinants of Health (SDOH) Interventions SDOH Screenings   Food Insecurity: No Food Insecurity (10/31/2023)  Housing: Low Risk  (10/31/2023)  Transportation Needs: No Transportation Needs (10/31/2023)  Utilities: Not At Risk (10/31/2023)  Depression (PHQ2-9): Low Risk  (03/16/2023)  Social Connections: Unknown (10/31/2023)  Tobacco Use: Medium Risk (10/30/2023)    Readmission Risk Interventions    10/31/2023   10:28 AM  Readmission Risk Prevention Plan  Transportation Screening Complete  Medication Review (RN Care Manager) Complete  PCP or Specialist appointment within 3-5 days of discharge Complete  HRI or Home Care Consult Complete  SW Recovery Care/Counseling Consult Complete  Palliative Care Screening Not Complete  Comments TBD  Skilled Nursing Facility Not Complete  SNF Comments TBD

## 2023-11-11 NOTE — Consult Note (Signed)
 Patient has been evaluated again today for inpatient Hospice at [facility]. He does not meet criteria for inpatient at this time as he continues to take medications without difficulty by mouth and has not required IV symptom management in the past 24 hours. He continues to take oral nutritional supplements without difficulty.   Please feel free to call if you have any questions or if Hospice can be of any assistance.  Joye Nobles, RN BSN [facility] [phone].

## 2023-11-12 DIAGNOSIS — J9601 Acute respiratory failure with hypoxia: Secondary | ICD-10-CM | POA: Diagnosis not present

## 2023-11-12 NOTE — Discharge Summary (Signed)
 Physician Discharge Summary  Jonathan Page ZOX:[id number] DOB: May 27, 1951 DOA: 10/30/2023  PCP: [name], NP  Admit date: 10/30/2023 Discharge date: 11/12/2023  Admitted From: SNF Disposition: To be determined  Recommendations for Outpatient Follow-up:  Follow up with hospice team as scheduled:  Discharge Condition: Poor CODE STATUS: DNR Diet recommendation: Comfort feeding as tolerated  Brief/Interim Summary: 73 year old man PMH non-small cell lung cancer metastatic to bone, progressive despite second line therapy, presented with shortness of breath. CT showed progression of disease, patient admitted for new hypoxia with respiratory failure, further evaluation. Treated with antibiotics, underwent thoracentesis with small postprocedure pneumothorax which is stable. Pulmonology recommended conservative management. Seen by oncology with recommendation for no further chemotherapy. Seen by palliative care for ultimately patient elected comfort care. Family declined available hospice bed, now not inpatient hospice eligible.   5/27 Family declined [facility] 5/28-29: Patient remains stable for discharge - TOC and family working on safe discharge 5/30: [facility] for [facility], patient's son declined referral - tentative plan for discharge home given family continues to refuse offered safe disposition to multiple facilities. Discharge appeal ongoing. [facility] offered bed - have their own hospice/palliative team Discharge denial upheld by [facility] - plan to discharge to long term care facility if family agrees, otherwise patient will discharge home by noon 6/1 per discussion with son, case management, care team and staff.  Discharge Diagnoses:  Principal Problem:   Acute respiratory failure with hypoxia (HCC) Active Problems:   Pleural effusion, bilateral   Iron  deficiency anemia   Essential hypertension   Non-small cell  lung cancer metastatic to bone (HCC)   Thrombocytopenia (HCC)   Uncontrolled type 2 diabetes mellitus with hyperglycemia, without long-term current use of insulin  (HCC)   AKI (acute kidney injury) (HCC)   Hypoglycemia   Pneumothorax   Pressure injury of skin   Malignant pleural effusion   DNR (do not resuscitate)   Cancer associated pain   Need for emotional support   Counseling and coordination of care   Acute hypoxic respiratory failure (HCC)   Palliative care encounter   High risk medication use   Concern about end of life   Medication management   Goals of care, counseling/discussion  Acute respiratory failure with hypoxia (HCC) Malignant left pleural effusion Right pleural effusion Non-small cell lung cancer metastatic to bone (HCC) Small left-sided pneumothorax status postthoracentesis Admitted for acute hypoxic respiratory failure, now on oxygen.  Plan pulm on oxygen.  Likely secondary to progression of disease but infection could not be ruled out.  Treated empirically with antibiotics. CT no PE.  Enlarging left upper lobe mass with worsening spread, increasing size and number of pulmonary metastases and nodules, enlarging bilateral pleural effusions, right upper lobe airspace disease pneumonia versus lymphangitic spread, diffuse osseous metastatic disease. Small pneumothorax status postthoracentesis.  Pulmonology recommended conservative management if repeat chest x-ray stable.  Repeat chest x-ray showed stable left apical and lateral hydropneumothorax, no evidence of tension physiology.  Continue conservative management. Per oncology not a candidate for further oncologic intervention. Comfort care     Iron  deficiency anemia Thrombocytopenia (HCC) Anemia likely multifactorial secondary to malignancy, recent chemotherapy, poor nutritional status  No further monitoring   AKI (acute kidney injury) (HCC) NAGMA No further monitoring   Uncontrolled type 2 diabetes mellitus  with hyperglycemia, without long-term current use of insulin  (HCC) Neuropathy Hypoglycemia Glucose low normal.  Hypoglycemia resolved with improving appetite. Holding glipizide , insulin . Continue gabapentin .  PMH SAH one month ago Pressure injury to sacrum stage 3, present on admission -continue wound care  Underweight  Body mass index is 18.47 kg/m.    Disposition From: [facility] To: Residential hospice was planned, but disposition now pending above decision by son (patient remains discharged)  Discharge Instructions   Allergies as of 11/12/2023   Not on File      Medication List     STOP taking these medications    Accu-Chek Aviva Plus test strip Generic drug: glucose blood   Accu-Chek Aviva Plus w/Device Kit   accu-chek soft touch lancets   Blood Glucose Monitoring Suppl Devi   dexamethasone  4 MG tablet Commonly known as: DECADRON    diclofenac  Sodium 1 % Gel Commonly known as: VOLTAREN    docusate sodium  100 MG capsule Commonly known as: COLACE   folic acid  1 MG tablet Commonly known as: FOLVITE    glipiZIDE  10 MG 24 hr tablet Commonly known as: GLUCOTROL  XL   insulin  lispro 100 UNIT/ML injection Commonly known as: HUMALOG   insulin  NPH-regular Human (70-30) 100 UNIT/ML injection   lidocaine -prilocaine  cream Commonly known as: EMLA    lisinopril  10 MG tablet Commonly known as: ZESTRIL    methocarbamol  500 MG tablet Commonly known as: ROBAXIN    ondansetron  8 MG tablet Commonly known as: ZOFRAN    prochlorperazine  10 MG tablet Commonly known as: COMPAZINE        TAKE these medications    feeding supplement Liqd Take 237 mLs by mouth 2 (two) times daily between meals. What changed: when to take this   gabapentin  300 MG capsule Commonly known as: NEURONTIN  Take 1 capsule (300 mg total) by mouth at bedtime. What changed: when to take this   oxyCODONE  5 MG immediate release tablet Commonly known as: Oxy  IR/ROXICODONE  Take 1 tablet (5 mg total) by mouth every 4 (four) hours as needed for moderate pain (pain score 4-6) or severe pain (pain score 7-10) (shortness of breath). What changed:  how much to take reasons to take this Another medication with the same name was removed. Continue taking this medication, and follow the directions you see here.        Not on File  Consultations: Palliative care, IR   Procedures/Studies: DG CHEST PORT 1 VIEW Result Date: 11/01/2023 CLINICAL DATA:  Pneumothorax. EXAM: PORTABLE CHEST 1 VIEW COMPARISON:  Oct 31, 2023. FINDINGS: Stable cardiomediastinal silhouette. Right internal jugular Port-A-Cath is unchanged. Stable bilateral opacities are noted concerning for multifocal pneumonia. Small left pleural effusion is noted. Stable possible minimal left apical and basilar pneumothorax. Bony thorax is unremarkable. IMPRESSION: Stable bilateral opacities concerning for multifocal pneumonia. Small left pleural effusion. Stable minimal left-sided pneumothorax. Electronically Signed   By: Rosalene Colon M.D.   On: 11/01/2023 12:00   DG CHEST PORT 1 VIEW Result Date: 10/31/2023 CLINICAL DATA:  Pneumothorax EXAM: PORTABLE CHEST 1 VIEW COMPARISON:  10/31/2023 FINDINGS: Small left apical and lateral hydropneumothorax demonstrating a small fluid component is stable. No radiographic evidence of tension physiology. No pneumothorax lumbar right. Small right pleural effusion is stable. Extensive multifocal airspace infiltrate, more severe within the right upper lung zone and perihilar regions, is unchanged in keeping with multifocal infection. Right internal jugular chest port tip is unchanged within the right atrium. Cardiac size within normal limits. IMPRESSION: 1. Stable small left apical and lateral hydropneumothorax. No radiographic evidence of tension physiology. 2. Stable multifocal pulmonary infiltrate. 3. Stable small right pleural effusion and a Electronically Signed    By:  Worthy Heads M.D.   On: 10/31/2023 20:25   US  THORACENTESIS ASP PLEURAL SPACE W/IMG GUIDE Result Date: 10/31/2023 INDICATION: 73 year old male with metastatic non-small-cell lung cancer, with new onset shortness of breath, and bilateral pleural effusion, left greater than right. IR was requested for diagnostic and therapeutic thoracentesis. EXAM: ULTRASOUND GUIDED DIAGNOSTIC AND THERAPEUTIC THORACENTESIS MEDICATIONS: 6 cc of 1% lidocaine  COMPLICATIONS: None immediate. PROCEDURE: An ultrasound guided thoracentesis was thoroughly discussed with the patient and questions answered. The benefits, risks, alternatives and complications were also discussed. The patient understands and wishes to proceed with the procedure. Written consent was obtained. Ultrasound was performed to localize and mark an adequate pocket of fluid in the left chest. The area was then prepped and draped in the normal sterile fashion. 1% Lidocaine  was used for local anesthesia. Under ultrasound guidance a 6 Fr Safe-T-Centesis catheter was introduced. Thoracentesis was performed. The catheter was removed and a dressing applied. FINDINGS: A total of approximately 1.10 L of clear, straw-colored pleural fluid was removed. Samples were sent to the laboratory as requested by the clinical team. IMPRESSION: Successful ultrasound guided left thoracentesis yielding 1.1 L of pleural fluid. Procedure performed by Lambert Pillion, PA-C Carim, PA-C Electronically Signed   By: Creasie Doctor M.D.   On: 10/31/2023 15:53   DG Chest 1 View Result Date: 10/31/2023 CLINICAL DATA:  Status post thoracentesis EXAM: CHEST  1 VIEW COMPARISON:  Chest x-ray 10/29/2023. FINDINGS: There is a small left-sided pneumothorax which is new from prior. There is a small left pleural effusion which has significantly decreased. Bilateral airspace disease, right greater than left, has not significantly changed. Cardiomediastinal silhouette is within normal limits. Right chest  port catheter tip projects over the distal SVC. No acute fractures are identified IMPRESSION: 1. New small left-sided pneumothorax. 2. Small left pleural effusion which has significantly decreased. 3. Stable bilateral airspace disease. Electronically Signed   By: Tyron Gallon M.D.   On: 10/31/2023 15:10   CT Angio Chest Pulmonary Embolism (PE) W or WO Contrast Result Date: 10/31/2023 CLINICAL DATA:  Shortness of breath, low O2 sats. EXAM: CT ANGIOGRAPHY CHEST WITH CONTRAST TECHNIQUE: Multidetector CT imaging of the chest was performed using the standard protocol during bolus administration of intravenous contrast. Multiplanar CT image reconstructions and MIPs were obtained to evaluate the vascular anatomy. RADIATION DOSE REDUCTION: This exam was performed according to the departmental dose-optimization program which includes automated exposure control, adjustment of the mA and/or kV according to patient size and/or use of iterative reconstruction technique. CONTRAST:  60mL OMNIPAQUE  IOHEXOL  350 MG/ML SOLN COMPARISON:  Chest CT 09/11/2023. FINDINGS: Cardiovascular: No filling defects in the pulmonary arteries to suggest pulmonary emboli. Heart is normal size. Aorta is normal caliber. Right Port-A-Cath remains in place, unchanged. Mediastinum/Nodes: Infiltrating tumor invading the left side of the mediastinum and into the left AP window, progressed since prior study. Worsening mediastinal and bilateral hilar adenopathy. No visible axillary adenopathy. Lungs/Pleura: Large loculated left pleural effusion and moderate right pleural effusion, increasing since prior study. Increasing size and number of metastases throughout the lungs. Central left upper lobe mass now measures 4.8 x 4.1 cm compared to 4.3 x 3.6 cm. Progressive tumor extension and endobronchial spread throughout the left upper lobe with confluent soft tissue thickening again noted along the peribronchovascular bundle, worsening since prior study.  Extensive airspace disease throughout the right upper lobe could reflect pneumonia or lymphangitic spread of tumor. Upper Abdomen: No acute findings Musculoskeletal: Soft tissue nodule superficial to xiphoid process measures 2.6  cm, stable. Diffuse osseous metastatic disease again noted, likely slightly progressed since prior study. Review of the MIP images confirms the above findings. IMPRESSION: No evidence of pulmonary embolus. Enlarging left upper lobe mass with worsening spread a along the peribronchovascular bundle in the left upper lobe. Increasing size and number of pulmonary metastases/nodules. Worsening mediastinal and bilateral hilar adenopathy. Enlarging bilateral pleural effusions, left larger than right. Airspace disease throughout the right upper lobe could reflect pneumonia or lymphangitic spread of tumor. Diffuse osseous metastatic disease, likely progressed since prior study. Electronically Signed   By: Janeece Mechanic M.D.   On: 10/31/2023 00:11   DG Chest Port 1 View Result Date: 10/30/2023 CLINICAL DATA:  Shortness of breath EXAM: PORTABLE CHEST 1 VIEW COMPARISON:  10/05/2023, CT 09/11/2023 FINDINGS: Right-sided central venous port tip at the cavoatrial junction. Small right-sided pleural effusion, small moderate probably loculated left pleural effusion, increased compared to prior radiograph. Distortion and irregular density in the left upper lobe, perihilar region appears more dense radiographically compared to prior. Persistent dense left lung base consolidation. New or worsened diffuse right-sided pulmonary infiltrates or edema. No pneumothorax IMPRESSION: 1. Small right-sided pleural effusion, small to moderate probably loculated left pleural effusion, increased compared to prior radiograph. 2. New or worsened diffuse right-sided pulmonary infiltrates or edema. 3. Distortion and irregular density in the left upper lobe, perihilar region appears more dense radiographically compared to prior  and corresponds to known history of malignancy. Persistent dense left lung base consolidation. Electronically Signed   By: Esmeralda Hedge M.D.   On: 10/30/2023 20:42     Subjective: No acute issues or events overnight denies nausea vomiting diarrhea constipation headache fevers chills or chest pain   Discharge Exam: Vitals:   11/11/23 1351 11/11/23 2019  BP: 95/68 99/61  Pulse: (!) 107 (!) 115  Resp: 16 (!) 22  Temp:  (!) 97.4 F (36.3 C)  SpO2: 96% 92%   Vitals:   11/10/23 1140 11/10/23 2115 11/11/23 1351 11/11/23 2019  BP: 94/70 125/80 95/68 99/61   Pulse: (!) 107  (!) 107 (!) 115  Resp: 18 15 16  (!) 22  Temp: 97.7 F (36.5 C) 97.8 F (36.6 C)  (!) 97.4 F (36.3 C)  TempSrc: Oral Oral    SpO2: 97% 93% 96% 92%  Weight:      Height:        General: Pt is alert, awake, not in acute distress Cardiovascular: RRR, S1/S2 +, no rubs, no gallops Respiratory: CTA bilaterally, no wheezing, no rhonchi Abdominal: Soft, NT, ND, bowel sounds + Extremities: no edema, no cyanosis   The results of significant diagnostics from this hospitalization (including imaging, microbiology, ancillary and laboratory) are listed below for reference.     Microbiology: No results found for this or any previous visit (from the past 240 hours).    Labs: BNP (last 3 results) Recent Labs    10/30/23 1945  BNP 269.3*     Time spent: Over 30 minutes  SIGNED:   Haydee Lipa, DO [facility] 11/12/2023, 7:48 AM Pager   If 7PM-7AM, please contact night-coverage [email or web address]

## 2023-11-12 NOTE — Plan of Care (Signed)

## 2023-11-13 DIAGNOSIS — J9601 Acute respiratory failure with hypoxia: Secondary | ICD-10-CM | POA: Diagnosis not present

## 2023-11-13 NOTE — Plan of Care (Signed)
  Problem: Education: Goal: Ability to describe self-care measures that may prevent or decrease complications (Diabetes Survival Skills Education) will improve Outcome: Not Progressing Goal: Individualized Educational Video(s) Outcome: Not Progressing   Problem: Coping: Goal: Ability to adjust to condition or change in health will improve Outcome: Not Progressing   Problem: Fluid Volume: Goal: Ability to maintain a balanced intake and output will improve Outcome: Not Progressing   Problem: Health Behavior/Discharge Planning: Goal: Ability to identify and utilize available resources and services will improve Outcome: Not Progressing

## 2023-11-13 NOTE — TOC Progression Note (Addendum)
 Transition of Care [facility]) - Progression Note    Patient Details  Name: Jonathan Page MRN: [id number] Date of Birth: 08/31/1950  [facility] CM/SW Contact  Bari Leys, RN Phone Number: 11/13/2023, 11:37 AM  Clinical Narrative:  NCM called to Debe Falco [phone]) at [facility] , introduced self and role of TOC/NCM. Deana Eves confirmed referral was received for evaluation for LTC placement, reports plan onsite evaluation at 11:30am on 11/14/23, team notified.  -12:13pm Call received from [place] with [facility] of [place], reports referral received from [facility], reports patient does not meet criteria for ALF, more appropriate for skilled nursing.       Expected Discharge Plan: Home w Hospice Care Barriers to Discharge: Continued Medical Work up  Expected Discharge Plan and Services In-house Referral: Clinical Social Work   Post Acute Care Choice: NA Living arrangements for the past 2 months: [facility]) Expected Discharge Date: 11/09/23               DME Arranged: N/A DME Agency: NA       HH Arranged: NA           Social Determinants of Health (SDOH) Interventions SDOH Screenings   Food Insecurity: No Food Insecurity (10/31/2023)  Housing: Low Risk  (10/31/2023)  Transportation Needs: No Transportation Needs (10/31/2023)  Utilities: Not At Risk (10/31/2023)  Depression (PHQ2-9): Low Risk  (03/16/2023)  Social Connections: Unknown (10/31/2023)  Tobacco Use: Medium Risk (10/30/2023)    Readmission Risk Interventions    10/31/2023   10:28 AM  Readmission Risk Prevention Plan  Transportation Screening Complete  Medication Review (RN Care Manager) Complete  PCP or Specialist appointment within 3-5 days of discharge Complete  HRI or Home Care Consult Complete  SW Recovery Care/Counseling Consult Complete  Palliative Care Screening Not Complete  Comments TBD  Skilled Nursing  Facility Not Complete  SNF Comments TBD

## 2023-11-13 NOTE — Plan of Care (Signed)
  Problem: Clinical Measurements: Goal: Will remain free from infection Outcome: Progressing   Problem: Elimination: Goal: Will not experience complications related to bowel motility Outcome: Progressing   Problem: Elimination: Goal: Will not experience complications related to urinary retention Outcome: Progressing   Problem: Pain Managment: Goal: General experience of comfort will improve and/or be controlled Outcome: Progressing   Problem: Safety: Goal: Ability to remain free from injury will improve Outcome: Progressing   Problem: Respiratory: Goal: Verbalizations of increased ease of respirations will increase Outcome: Progressing

## 2023-11-13 NOTE — Plan of Care (Signed)

## 2023-11-13 NOTE — Progress Notes (Signed)
 Physician Discharge Summary  Jonathan Page:[id number] DOB: 10/05/1950 DOA: 10/30/2023  PCP: [name], NP  Admit date: 10/30/2023 Discharge date: 11/13/2023  Admitted From: SNF Disposition: To be determined  Recommendations for Outpatient Follow-up:  Follow up with hospice team as scheduled:  Discharge Condition: Poor CODE STATUS: DNR Diet recommendation: Comfort feeding as tolerated  Brief/Interim Summary: 73 year old man PMH non-small cell lung cancer metastatic to bone, progressive despite second line therapy, presented with shortness of breath. CT showed progression of disease, patient admitted for new hypoxia with respiratory failure, further evaluation. Treated with antibiotics, underwent thoracentesis with small postprocedure pneumothorax which is stable. Pulmonology recommended conservative management. Seen by oncology with recommendation for no further chemotherapy. Seen by palliative care for ultimately patient elected comfort care. Family declined available hospice bed, now not inpatient hospice eligible.   5/27 Family declined [facility] 5/28-29: Patient remains stable for discharge - TOC and family working on safe discharge 5/30-31: [facility] offered to evaluate patient for acceptance, patient's son declined referral - tentative plan for discharge home given family continues to refuse offered safe disposition to multiple facilities. Discharge appeal ongoing. [facility] offered bed - have their own hospice/palliative team Discharge denial upheld by [facility] - plan to discharge to long term care facility if family agrees, otherwise patient will discharge home by noon 6/1 per discussion with son, case management, care team and staff. 6/1-present: Patient remains discharged - family continue to push back on discharge - awaiting safe disposition of their choice (which we have secured at multiple sites) -case management discussing getting  Adult Protective Services involved  Discharge Diagnoses:  Principal Problem:   Acute respiratory failure with hypoxia (HCC) Active Problems:   Pleural effusion, bilateral   Iron  deficiency anemia   Essential hypertension   Non-small cell lung cancer metastatic to bone (HCC)   Thrombocytopenia (HCC)   Uncontrolled type 2 diabetes mellitus with hyperglycemia, without long-term current use of insulin  (HCC)   AKI (acute kidney injury) (HCC)   Hypoglycemia   Pneumothorax   Pressure injury of skin   Malignant pleural effusion   DNR (do not resuscitate)   Cancer associated pain   Need for emotional support   Counseling and coordination of care   Acute hypoxic respiratory failure (HCC)   Palliative care encounter   High risk medication use   Concern about end of life   Medication management   Goals of care, counseling/discussion  Acute respiratory failure with hypoxia (HCC) Malignant left pleural effusion Right pleural effusion Non-small cell lung cancer metastatic to bone (HCC) Small left-sided pneumothorax status postthoracentesis Admitted for acute hypoxic respiratory failure, now on oxygen.  Plan pulm on oxygen.  Likely secondary to progression of disease but infection could not be ruled out.  Treated empirically with antibiotics. CT no PE.  Enlarging left upper lobe mass with worsening spread, increasing size and number of pulmonary metastases and nodules, enlarging bilateral pleural effusions, right upper lobe airspace disease pneumonia versus lymphangitic spread, diffuse osseous metastatic disease. Small pneumothorax status postthoracentesis.  Pulmonology recommended conservative management if repeat chest x-ray stable.  Repeat chest x-ray showed stable left apical and lateral hydropneumothorax, no evidence of tension physiology.  Continue conservative management. Per oncology not a candidate for further oncologic intervention. Comfort care     Iron  deficiency  anemia Thrombocytopenia (HCC) Anemia likely multifactorial secondary to malignancy, recent chemotherapy, poor nutritional status  No further monitoring   AKI (acute kidney injury) (HCC) NAGMA No  further monitoring   Uncontrolled type 2 diabetes mellitus with hyperglycemia, without long-term current use of insulin  (HCC) Neuropathy Hypoglycemia Glucose low normal.  Hypoglycemia resolved with improving appetite. Holding glipizide , insulin . Continue gabapentin .   PMH SAH one month ago Pressure injury to sacrum stage 3, present on admission -continue wound care  Underweight  Body mass index is 18.47 kg/m.    Disposition From: [facility] To: Residential hospice was planned, but disposition now pending above decision by son (patient remains discharged)  Discharge Instructions   Allergies as of 11/13/2023   Not on File      Medication List     STOP taking these medications    Accu-Chek Aviva Plus test strip Generic drug: glucose blood   Accu-Chek Aviva Plus w/Device Kit   accu-chek soft touch lancets   Blood Glucose Monitoring Suppl Devi   dexamethasone  4 MG tablet Commonly known as: DECADRON    diclofenac  Sodium 1 % Gel Commonly known as: VOLTAREN    docusate sodium  100 MG capsule Commonly known as: COLACE   folic acid  1 MG tablet Commonly known as: FOLVITE    glipiZIDE  10 MG 24 hr tablet Commonly known as: GLUCOTROL  XL   insulin  lispro 100 UNIT/ML injection Commonly known as: HUMALOG   insulin  NPH-regular Human (70-30) 100 UNIT/ML injection   lidocaine -prilocaine  cream Commonly known as: EMLA    lisinopril  10 MG tablet Commonly known as: ZESTRIL    methocarbamol  500 MG tablet Commonly known as: ROBAXIN    ondansetron  8 MG tablet Commonly known as: ZOFRAN    prochlorperazine  10 MG tablet Commonly known as: COMPAZINE        TAKE these medications    feeding supplement Liqd Take 237 mLs by mouth 2 (two) times daily between  meals. What changed: when to take this   gabapentin  300 MG capsule Commonly known as: NEURONTIN  Take 1 capsule (300 mg total) by mouth at bedtime. What changed: when to take this   oxyCODONE  5 MG immediate release tablet Commonly known as: Oxy IR/ROXICODONE  Take 1 tablet (5 mg total) by mouth every 4 (four) hours as needed for moderate pain (pain score 4-6) or severe pain (pain score 7-10) (shortness of breath). What changed:  how much to take reasons to take this Another medication with the same name was removed. Continue taking this medication, and follow the directions you see here.        Not on File  Consultations: Palliative care, IR   Procedures/Studies: DG CHEST PORT 1 VIEW Result Date: 11/01/2023 CLINICAL DATA:  Pneumothorax. EXAM: PORTABLE CHEST 1 VIEW COMPARISON:  Oct 31, 2023. FINDINGS: Stable cardiomediastinal silhouette. Right internal jugular Port-A-Cath is unchanged. Stable bilateral opacities are noted concerning for multifocal pneumonia. Small left pleural effusion is noted. Stable possible minimal left apical and basilar pneumothorax. Bony thorax is unremarkable. IMPRESSION: Stable bilateral opacities concerning for multifocal pneumonia. Small left pleural effusion. Stable minimal left-sided pneumothorax. Electronically Signed   By: Rosalene Colon M.D.   On: 11/01/2023 12:00   DG CHEST PORT 1 VIEW Result Date: 10/31/2023 CLINICAL DATA:  Pneumothorax EXAM: PORTABLE CHEST 1 VIEW COMPARISON:  10/31/2023 FINDINGS: Small left apical and lateral hydropneumothorax demonstrating a small fluid component is stable. No radiographic evidence of tension physiology. No pneumothorax lumbar right. Small right pleural effusion is stable. Extensive multifocal airspace infiltrate, more severe within the right upper lung zone and perihilar regions, is unchanged in keeping with multifocal infection. Right internal jugular chest port tip is unchanged within the right atrium. Cardiac size  within normal limits. IMPRESSION: 1. Stable small left apical and lateral hydropneumothorax. No radiographic evidence of tension physiology. 2. Stable multifocal pulmonary infiltrate. 3. Stable small right pleural effusion and a Electronically Signed   By: Worthy Heads M.D.   On: 10/31/2023 20:25   US  THORACENTESIS ASP PLEURAL SPACE W/IMG GUIDE Result Date: 10/31/2023 INDICATION: 73 year old male with metastatic non-small-cell lung cancer, with new onset shortness of breath, and bilateral pleural effusion, left greater than right. IR was requested for diagnostic and therapeutic thoracentesis. EXAM: ULTRASOUND GUIDED DIAGNOSTIC AND THERAPEUTIC THORACENTESIS MEDICATIONS: 6 cc of 1% lidocaine  COMPLICATIONS: None immediate. PROCEDURE: An ultrasound guided thoracentesis was thoroughly discussed with the patient and questions answered. The benefits, risks, alternatives and complications were also discussed. The patient understands and wishes to proceed with the procedure. Written consent was obtained. Ultrasound was performed to localize and mark an adequate pocket of fluid in the left chest. The area was then prepped and draped in the normal sterile fashion. 1% Lidocaine  was used for local anesthesia. Under ultrasound guidance a 6 Fr Safe-T-Centesis catheter was introduced. Thoracentesis was performed. The catheter was removed and a dressing applied. FINDINGS: A total of approximately 1.10 L of clear, straw-colored pleural fluid was removed. Samples were sent to the laboratory as requested by the clinical team. IMPRESSION: Successful ultrasound guided left thoracentesis yielding 1.1 L of pleural fluid. Procedure performed by Lambert Pillion, PA-C Carim, PA-C Electronically Signed   By: Creasie Doctor M.D.   On: 10/31/2023 15:53   DG Chest 1 View Result Date: 10/31/2023 CLINICAL DATA:  Status post thoracentesis EXAM: CHEST  1 VIEW COMPARISON:  Chest x-ray 10/29/2023. FINDINGS: There is a small left-sided pneumothorax  which is new from prior. There is a small left pleural effusion which has significantly decreased. Bilateral airspace disease, right greater than left, has not significantly changed. Cardiomediastinal silhouette is within normal limits. Right chest port catheter tip projects over the distal SVC. No acute fractures are identified IMPRESSION: 1. New small left-sided pneumothorax. 2. Small left pleural effusion which has significantly decreased. 3. Stable bilateral airspace disease. Electronically Signed   By: Tyron Gallon M.D.   On: 10/31/2023 15:10   CT Angio Chest Pulmonary Embolism (PE) W or WO Contrast Result Date: 10/31/2023 CLINICAL DATA:  Shortness of breath, low O2 sats. EXAM: CT ANGIOGRAPHY CHEST WITH CONTRAST TECHNIQUE: Multidetector CT imaging of the chest was performed using the standard protocol during bolus administration of intravenous contrast. Multiplanar CT image reconstructions and MIPs were obtained to evaluate the vascular anatomy. RADIATION DOSE REDUCTION: This exam was performed according to the departmental dose-optimization program which includes automated exposure control, adjustment of the mA and/or kV according to patient size and/or use of iterative reconstruction technique. CONTRAST:  60mL OMNIPAQUE  IOHEXOL  350 MG/ML SOLN COMPARISON:  Chest CT 09/11/2023. FINDINGS: Cardiovascular: No filling defects in the pulmonary arteries to suggest pulmonary emboli. Heart is normal size. Aorta is normal caliber. Right Port-A-Cath remains in place, unchanged. Mediastinum/Nodes: Infiltrating tumor invading the left side of the mediastinum and into the left AP window, progressed since prior study. Worsening mediastinal and bilateral hilar adenopathy. No visible axillary adenopathy. Lungs/Pleura: Large loculated left pleural effusion and moderate right pleural effusion, increasing since prior study. Increasing size and number of metastases throughout the lungs. Central left upper lobe mass now  measures 4.8 x 4.1 cm compared to 4.3 x 3.6 cm. Progressive tumor extension and endobronchial spread throughout the left upper lobe with confluent soft tissue thickening again noted along the peribronchovascular  bundle, worsening since prior study. Extensive airspace disease throughout the right upper lobe could reflect pneumonia or lymphangitic spread of tumor. Upper Abdomen: No acute findings Musculoskeletal: Soft tissue nodule superficial to xiphoid process measures 2.6 cm, stable. Diffuse osseous metastatic disease again noted, likely slightly progressed since prior study. Review of the MIP images confirms the above findings. IMPRESSION: No evidence of pulmonary embolus. Enlarging left upper lobe mass with worsening spread a along the peribronchovascular bundle in the left upper lobe. Increasing size and number of pulmonary metastases/nodules. Worsening mediastinal and bilateral hilar adenopathy. Enlarging bilateral pleural effusions, left larger than right. Airspace disease throughout the right upper lobe could reflect pneumonia or lymphangitic spread of tumor. Diffuse osseous metastatic disease, likely progressed since prior study. Electronically Signed   By: Janeece Mechanic M.D.   On: 10/31/2023 00:11   DG Chest Port 1 View Result Date: 10/30/2023 CLINICAL DATA:  Shortness of breath EXAM: PORTABLE CHEST 1 VIEW COMPARISON:  10/05/2023, CT 09/11/2023 FINDINGS: Right-sided central venous port tip at the cavoatrial junction. Small right-sided pleural effusion, small moderate probably loculated left pleural effusion, increased compared to prior radiograph. Distortion and irregular density in the left upper lobe, perihilar region appears more dense radiographically compared to prior. Persistent dense left lung base consolidation. New or worsened diffuse right-sided pulmonary infiltrates or edema. No pneumothorax IMPRESSION: 1. Small right-sided pleural effusion, small to moderate probably loculated left pleural  effusion, increased compared to prior radiograph. 2. New or worsened diffuse right-sided pulmonary infiltrates or edema. 3. Distortion and irregular density in the left upper lobe, perihilar region appears more dense radiographically compared to prior and corresponds to known history of malignancy. Persistent dense left lung base consolidation. Electronically Signed   By: Esmeralda Hedge M.D.   On: 10/30/2023 20:42     Subjective: No acute issues or events overnight denies nausea vomiting diarrhea constipation headache fevers chills or chest pain   Discharge Exam: Vitals:   11/12/23 1341 11/12/23 2104  BP: 98/70 93/61  Pulse: (!) 108 (!) 107  Resp:  20  Temp: 97.8 F (36.6 C) 97.9 F (36.6 C)  SpO2: (!) 89% 93%   Vitals:   11/11/23 1351 11/11/23 2019 11/12/23 1341 11/12/23 2104  BP: 95/68 99/61 98/70  93/61  Pulse: (!) 107 (!) 115 (!) 108 (!) 107  Resp: 16 (!) 22  20  Temp:  (!) 97.4 F (36.3 C) 97.8 F (36.6 C) 97.9 F (36.6 C)  TempSrc:   Oral Oral  SpO2: 96% 92% (!) 89% 93%  Weight:      Height:        General: Pt is alert, awake, not in acute distress Cardiovascular: RRR, S1/S2 +, no rubs, no gallops Respiratory: CTA bilaterally, no wheezing, no rhonchi Abdominal: Soft, NT, ND, bowel sounds + Extremities: no edema, no cyanosis   The results of significant diagnostics from this hospitalization (including imaging, microbiology, ancillary and laboratory) are listed below for reference.     Microbiology: No results found for this or any previous visit (from the past 240 hours).    Labs: BNP (last 3 results) Recent Labs    10/30/23 1945  BNP 269.3*     Time spent: Over 30 minutes  SIGNED:   Haydee Lipa, DO [facility] 11/13/2023, 7:43 AM Pager   If 7PM-7AM, please contact night-coverage [email or web address]

## 2023-11-14 DIAGNOSIS — J9601 Acute respiratory failure with hypoxia: Secondary | ICD-10-CM | POA: Diagnosis not present

## 2023-11-14 MED ORDER — HEPARIN SOD (PORK) LOCK FLUSH 100 UNIT/ML IV SOLN
500.0000 [IU] | INTRAVENOUS | Status: AC | PRN
  Administered 2023-11-14: 500 [IU]

## 2023-11-14 NOTE — Progress Notes (Signed)
 Physician Discharge Summary  Jonathan Page UJW:[id number] DOB: 05-09-1951 DOA: 10/30/2023  PCP: [name], NP  Admit date: 10/30/2023 Discharge date: 11/14/2023  Admitted From: SNF Disposition: To be determined  Recommendations for Outpatient Follow-up:  Follow up with hospice team as scheduled:  Discharge Condition: Poor CODE STATUS: DNR Diet recommendation: Comfort feeding as tolerated  Brief/Interim Summary: 73 year old man PMH non-small cell lung cancer metastatic to bone, progressive despite second line therapy, presented with shortness of breath. CT showed progression of disease, patient admitted for new hypoxia with respiratory failure, further evaluation. Treated with antibiotics, underwent thoracentesis with small postprocedure pneumothorax which is stable. Pulmonology recommended conservative management. Seen by oncology with recommendation for no further chemotherapy. Seen by palliative care for ultimately patient elected comfort care. Family declined available hospice bed, now not inpatient hospice eligible.   5/27 Family declined [facility] bed offer 5/28-29: Patient remains stable for discharge - TOC and family working on safe discharge; no longer candidate for [facility] 5/30-31: [facility] and [facility]iative Care offered to evaluate patient for acceptance, patient's son declined referral - tentative plan for discharge home given family continues to refuse offered safe disposition to multiple facilities. Discharge appeal ongoing. [facility] offered bed - have their own hospice/palliative team Discharge denial upheld by [facility] - plan to discharge to long term care facility if family agrees/secures payment for, otherwise patient will discharge home at noon 6/1 per discussion with son, case management, care team and staff. 6/1-present: Patient remains discharged - family continue to push back on discharge - awaiting safe disposition of their choice (which we  have secured at multiple sites) - case management/administration discussed securing Adult Protective Services assistance.  Discharge Diagnoses:  Principal Problem:   Acute respiratory failure with hypoxia (HCC) Active Problems:   Pleural effusion, bilateral   Iron  deficiency anemia   Essential hypertension   Non-small cell lung cancer metastatic to bone (HCC)   Thrombocytopenia (HCC)   Uncontrolled type 2 diabetes mellitus with hyperglycemia, without long-term current use of insulin  (HCC)   AKI (acute kidney injury) (HCC)   Hypoglycemia   Pneumothorax   Pressure injury of skin   Malignant pleural effusion   DNR (do not resuscitate)   Cancer associated pain   Need for emotional support   Counseling and coordination of care   Acute hypoxic respiratory failure (HCC)   Palliative care encounter   High risk medication use   Concern about end of life   Medication management   Goals of care, counseling/discussion  Acute respiratory failure with hypoxia (HCC) Malignant left pleural effusion Right pleural effusion Non-small cell lung cancer metastatic to bone (HCC) Small left-sided pneumothorax status postthoracentesis Admitted for acute hypoxic respiratory failure, now on oxygen.  Plan pulm on oxygen.  Likely secondary to progression of disease but infection could not be ruled out.  Treated empirically with antibiotics. CT no PE.  Enlarging left upper lobe mass with worsening spread, increasing size and number of pulmonary metastases and nodules, enlarging bilateral pleural effusions, right upper lobe airspace disease pneumonia versus lymphangitic spread, diffuse osseous metastatic disease. Small pneumothorax status postthoracentesis.  Pulmonology recommended conservative management if repeat chest x-ray stable.  Repeat chest x-ray showed stable left apical and lateral hydropneumothorax, no evidence of tension physiology.  Continue conservative management. Per oncology not a candidate for  further oncologic intervention. Comfort care     Iron  deficiency anemia Thrombocytopenia (HCC) Anemia likely multifactorial secondary to malignancy, recent chemotherapy, poor nutritional status  No  further monitoring   AKI (acute kidney injury) (HCC) NAGMA No further monitoring   Uncontrolled type 2 diabetes mellitus with hyperglycemia, without long-term current use of insulin  (HCC) Neuropathy Hypoglycemia Glucose low normal.  Hypoglycemia resolved with improving appetite. Holding glipizide , insulin . Continue gabapentin .   PMH SAH one month ago Pressure injury to sacrum stage 3, present on admission -continue wound care  Underweight  Body mass index is 18.47 kg/m.    Disposition From: [facility] To: Residential hospice was planned, but disposition now pending above decision by son (patient remains discharged)  Discharge Instructions   Allergies as of 11/14/2023   Not on File      Medication List     STOP taking these medications    Accu-Chek Aviva Plus test strip Generic drug: glucose blood   Accu-Chek Aviva Plus w/Device Kit   accu-chek soft touch lancets   Blood Glucose Monitoring Suppl Devi   dexamethasone  4 MG tablet Commonly known as: DECADRON    diclofenac  Sodium 1 % Gel Commonly known as: VOLTAREN    docusate sodium  100 MG capsule Commonly known as: COLACE   folic acid  1 MG tablet Commonly known as: FOLVITE    glipiZIDE  10 MG 24 hr tablet Commonly known as: GLUCOTROL  XL   insulin  lispro 100 UNIT/ML injection Commonly known as: HUMALOG   insulin  NPH-regular Human (70-30) 100 UNIT/ML injection   lidocaine -prilocaine  cream Commonly known as: EMLA    lisinopril  10 MG tablet Commonly known as: ZESTRIL    methocarbamol  500 MG tablet Commonly known as: ROBAXIN    ondansetron  8 MG tablet Commonly known as: ZOFRAN    prochlorperazine  10 MG tablet Commonly known as: COMPAZINE        TAKE these medications    feeding  supplement Liqd Take 237 mLs by mouth 2 (two) times daily between meals. What changed: when to take this   gabapentin  300 MG capsule Commonly known as: NEURONTIN  Take 1 capsule (300 mg total) by mouth at bedtime. What changed: when to take this   oxyCODONE  5 MG immediate release tablet Commonly known as: Oxy IR/ROXICODONE  Take 1 tablet (5 mg total) by mouth every 4 (four) hours as needed for moderate pain (pain score 4-6) or severe pain (pain score 7-10) (shortness of breath). What changed:  how much to take reasons to take this Another medication with the same name was removed. Continue taking this medication, and follow the directions you see here.        Not on File  Consultations: Palliative care, IR   Procedures/Studies: DG CHEST PORT 1 VIEW Result Date: 11/01/2023 CLINICAL DATA:  Pneumothorax. EXAM: PORTABLE CHEST 1 VIEW COMPARISON:  Oct 31, 2023. FINDINGS: Stable cardiomediastinal silhouette. Right internal jugular Port-A-Cath is unchanged. Stable bilateral opacities are noted concerning for multifocal pneumonia. Small left pleural effusion is noted. Stable possible minimal left apical and basilar pneumothorax. Bony thorax is unremarkable. IMPRESSION: Stable bilateral opacities concerning for multifocal pneumonia. Small left pleural effusion. Stable minimal left-sided pneumothorax. Electronically Signed   By: Rosalene Colon M.D.   On: 11/01/2023 12:00   DG CHEST PORT 1 VIEW Result Date: 10/31/2023 CLINICAL DATA:  Pneumothorax EXAM: PORTABLE CHEST 1 VIEW COMPARISON:  10/31/2023 FINDINGS: Small left apical and lateral hydropneumothorax demonstrating a small fluid component is stable. No radiographic evidence of tension physiology. No pneumothorax lumbar right. Small right pleural effusion is stable. Extensive multifocal airspace infiltrate, more severe within the right upper lung zone and perihilar regions, is unchanged in keeping with multifocal infection. Right internal  jugular  chest port tip is unchanged within the right atrium. Cardiac size within normal limits. IMPRESSION: 1. Stable small left apical and lateral hydropneumothorax. No radiographic evidence of tension physiology. 2. Stable multifocal pulmonary infiltrate. 3. Stable small right pleural effusion and a Electronically Signed   By: Worthy Heads M.D.   On: 10/31/2023 20:25   US  THORACENTESIS ASP PLEURAL SPACE W/IMG GUIDE Result Date: 10/31/2023 INDICATION: 73 year old male with metastatic non-small-cell lung cancer, with new onset shortness of breath, and bilateral pleural effusion, left greater than right. IR was requested for diagnostic and therapeutic thoracentesis. EXAM: ULTRASOUND GUIDED DIAGNOSTIC AND THERAPEUTIC THORACENTESIS MEDICATIONS: 6 cc of 1% lidocaine  COMPLICATIONS: None immediate. PROCEDURE: An ultrasound guided thoracentesis was thoroughly discussed with the patient and questions answered. The benefits, risks, alternatives and complications were also discussed. The patient understands and wishes to proceed with the procedure. Written consent was obtained. Ultrasound was performed to localize and mark an adequate pocket of fluid in the left chest. The area was then prepped and draped in the normal sterile fashion. 1% Lidocaine  was used for local anesthesia. Under ultrasound guidance a 6 Fr Safe-T-Centesis catheter was introduced. Thoracentesis was performed. The catheter was removed and a dressing applied. FINDINGS: A total of approximately 1.10 L of clear, straw-colored pleural fluid was removed. Samples were sent to the laboratory as requested by the clinical team. IMPRESSION: Successful ultrasound guided left thoracentesis yielding 1.1 L of pleural fluid. Procedure performed by Lambert Pillion, PA-C Carim, PA-C Electronically Signed   By: Creasie Doctor M.D.   On: 10/31/2023 15:53   DG Chest 1 View Result Date: 10/31/2023 CLINICAL DATA:  Status post thoracentesis EXAM: CHEST  1 VIEW COMPARISON:   Chest x-ray 10/29/2023. FINDINGS: There is a small left-sided pneumothorax which is new from prior. There is a small left pleural effusion which has significantly decreased. Bilateral airspace disease, right greater than left, has not significantly changed. Cardiomediastinal silhouette is within normal limits. Right chest port catheter tip projects over the distal SVC. No acute fractures are identified IMPRESSION: 1. New small left-sided pneumothorax. 2. Small left pleural effusion which has significantly decreased. 3. Stable bilateral airspace disease. Electronically Signed   By: Tyron Gallon M.D.   On: 10/31/2023 15:10   CT Angio Chest Pulmonary Embolism (PE) W or WO Contrast Result Date: 10/31/2023 CLINICAL DATA:  Shortness of breath, low O2 sats. EXAM: CT ANGIOGRAPHY CHEST WITH CONTRAST TECHNIQUE: Multidetector CT imaging of the chest was performed using the standard protocol during bolus administration of intravenous contrast. Multiplanar CT image reconstructions and MIPs were obtained to evaluate the vascular anatomy. RADIATION DOSE REDUCTION: This exam was performed according to the departmental dose-optimization program which includes automated exposure control, adjustment of the mA and/or kV according to patient size and/or use of iterative reconstruction technique. CONTRAST:  60mL OMNIPAQUE  IOHEXOL  350 MG/ML SOLN COMPARISON:  Chest CT 09/11/2023. FINDINGS: Cardiovascular: No filling defects in the pulmonary arteries to suggest pulmonary emboli. Heart is normal size. Aorta is normal caliber. Right Port-A-Cath remains in place, unchanged. Mediastinum/Nodes: Infiltrating tumor invading the left side of the mediastinum and into the left AP window, progressed since prior study. Worsening mediastinal and bilateral hilar adenopathy. No visible axillary adenopathy. Lungs/Pleura: Large loculated left pleural effusion and moderate right pleural effusion, increasing since prior study. Increasing size and number  of metastases throughout the lungs. Central left upper lobe mass now measures 4.8 x 4.1 cm compared to 4.3 x 3.6 cm. Progressive tumor extension and endobronchial spread throughout the left upper  lobe with confluent soft tissue thickening again noted along the peribronchovascular bundle, worsening since prior study. Extensive airspace disease throughout the right upper lobe could reflect pneumonia or lymphangitic spread of tumor. Upper Abdomen: No acute findings Musculoskeletal: Soft tissue nodule superficial to xiphoid process measures 2.6 cm, stable. Diffuse osseous metastatic disease again noted, likely slightly progressed since prior study. Review of the MIP images confirms the above findings. IMPRESSION: No evidence of pulmonary embolus. Enlarging left upper lobe mass with worsening spread a along the peribronchovascular bundle in the left upper lobe. Increasing size and number of pulmonary metastases/nodules. Worsening mediastinal and bilateral hilar adenopathy. Enlarging bilateral pleural effusions, left larger than right. Airspace disease throughout the right upper lobe could reflect pneumonia or lymphangitic spread of tumor. Diffuse osseous metastatic disease, likely progressed since prior study. Electronically Signed   By: Janeece Mechanic M.D.   On: 10/31/2023 00:11   DG Chest Port 1 View Result Date: 10/30/2023 CLINICAL DATA:  Shortness of breath EXAM: PORTABLE CHEST 1 VIEW COMPARISON:  10/05/2023, CT 09/11/2023 FINDINGS: Right-sided central venous port tip at the cavoatrial junction. Small right-sided pleural effusion, small moderate probably loculated left pleural effusion, increased compared to prior radiograph. Distortion and irregular density in the left upper lobe, perihilar region appears more dense radiographically compared to prior. Persistent dense left lung base consolidation. New or worsened diffuse right-sided pulmonary infiltrates or edema. No pneumothorax IMPRESSION: 1. Small right-sided  pleural effusion, small to moderate probably loculated left pleural effusion, increased compared to prior radiograph. 2. New or worsened diffuse right-sided pulmonary infiltrates or edema. 3. Distortion and irregular density in the left upper lobe, perihilar region appears more dense radiographically compared to prior and corresponds to known history of malignancy. Persistent dense left lung base consolidation. Electronically Signed   By: Esmeralda Hedge M.D.   On: 10/30/2023 20:42     Subjective: No acute issues or events overnight denies nausea vomiting diarrhea constipation headache fevers chills or chest pain   Discharge Exam: Vitals:   11/13/23 2113 11/14/23 0536  BP: 96/65 123/77  Pulse: (!) 103 (!) 110  Resp: 16 18  Temp: 97.9 F (36.6 C) 97.9 F (36.6 C)  SpO2: 94% (!) 86%   Vitals:   11/12/23 2104 11/13/23 1225 11/13/23 2113 11/14/23 0536  BP: 93/61 107/74 96/65 123/77  Pulse: (!) 107 99 (!) 103 (!) 110  Resp: 20 20 16 18   Temp: 97.9 F (36.6 C) 97.7 F (36.5 C) 97.9 F (36.6 C) 97.9 F (36.6 C)  TempSrc: Oral Axillary Oral Oral  SpO2: 93% 96% 94% (!) 86%  Weight:      Height:        General: Pt is alert, awake, not in acute distress Cardiovascular: RRR, S1/S2 +, no rubs, no gallops Respiratory: CTA bilaterally, no wheezing, no rhonchi Abdominal: Soft, NT, ND, bowel sounds + Extremities: no edema, no cyanosis   The results of significant diagnostics from this hospitalization (including imaging, microbiology, ancillary and laboratory) are listed below for reference.     Microbiology: No results found for this or any previous visit (from the past 240 hours).    Labs: BNP (last 3 results) Recent Labs    10/30/23 1945  BNP 269.3*     Time spent: Over 30 minutes  SIGNED:   Haydee Lipa, DO [facility] 11/14/2023, 7:37 AM Pager   If 7PM-7AM, please contact night-coverage [email or web address]

## 2023-11-14 NOTE — Plan of Care (Signed)

## 2023-11-14 NOTE — [place] FL2 (Signed)
 [place]  MEDICAID FL2 LEVEL OF CARE FORM     IDENTIFICATION  Patient Name: Jonathan Page Birthdate: April 16, 1951 Sex: male Admission Date (Current Location): 10/30/2023  [facility] and [place] Number:  Producer, television/film/video and Address:  [facility],  [address] [place], [place] [address]      Provider Number: [id number]  Attending Physician Name and Address:  Haydee Lipa, MD  Relative Name and Phone Number:  Yamen, Castrogiovanni [phone])  [phone] [facility])    Current Level of Care: Hospital Recommended Level of Care: Skilled Nursing Facility (Long Term Care) Prior Approval Number:    Date Approved/Denied:   PASRR Number: [id number]  Discharge Plan: SNF (Long Term Care)    Current Diagnoses: Patient Active Problem List   Diagnosis Date Noted   High risk medication use 11/02/2023   Concern about end of life 11/02/2023   Medication management 11/02/2023   Goals of care, counseling/discussion 11/02/2023   Pneumothorax 11/01/2023   Pressure injury of skin 11/01/2023   Malignant pleural effusion 11/01/2023   DNR (do not resuscitate) 11/01/2023   Cancer associated pain 11/01/2023   Need for emotional support 11/01/2023   Counseling and coordination of care 11/01/2023   Acute hypoxic respiratory failure (HCC) 11/01/2023   Palliative care encounter 11/01/2023   Acute respiratory failure with hypoxia (HCC) 10/30/2023   AKI (acute kidney injury) (HCC) 10/30/2023   Pleural effusion, bilateral 10/30/2023   Acute CHF (HCC) 10/30/2023   Hypoglycemia 10/30/2023   Subarachnoid hematoma (HCC) 10/04/2023   Subarachnoid hemorrhage (HCC) 10/02/2023   Iron  deficiency anemia 08/08/2023   Anemia 08/01/2023   Thrombocytopenia (HCC) 08/01/2023   On antineoplastic chemotherapy 08/01/2023   Uncontrolled type 2 diabetes mellitus with hyperglycemia, without long-term current use of insulin  (HCC) 08/01/2023   Chronic dyspnea 08/01/2023   Leg edema, right 08/01/2023   S/P total  left hip intramedullary nail 07/25/23 08/01/2023   Status post hip surgery 07/25/2023   Port-A-Cath in place 07/17/2023   Encounter for antineoplastic chemotherapy 07/03/2023   Encounter for antineoplastic immunotherapy 07/03/2023   Non-small cell lung cancer metastatic to bone (HCC) 06/22/2023   Lung mass 06/02/2023   Diabetes mellitus treated with oral medication (HCC) 05/29/2023   Restless legs syndrome (RLS) 05/29/2023   Chest pain 01/20/2023   Left leg pain 10/28/2022   Skin lesion 04/16/2021   Preventative health care 11/08/2017   Syncope and collapse 10/14/2016   Prolonged QT interval 10/14/2016   Hyperlipidemia 01/25/2016   CKD (chronic kidney disease) stage 3, GFR 30-59 ml/min (HCC) 01/25/2016   Essential hypertension 10/27/2015   Type II diabetes mellitus with nephropathy (HCC) 10/22/2015    Orientation RESPIRATION BLADDER Height & Weight     Self, Situation  O2 Incontinent, External catheter Weight: 66.8 kg (Taken with bed) Height:  6\' 1"  (185.4 cm)  BEHAVIORAL SYMPTOMS/MOOD NEUROLOGICAL BOWEL NUTRITION STATUS      Incontinent Diet (Diet regular Fluid consistency: Thin)  AMBULATORY STATUS COMMUNICATION OF NEEDS Skin   Extensive Assist Verbally PU Stage and Appropriate Care (Stage 3 Sacrum pressure injury, foam dressing change prn)                       Personal Care Assistance Level of Assistance  Bathing, Feeding, Dressing Bathing Assistance: Maximum assistance Feeding assistance: Maximum assistance Dressing Assistance: Maximum assistance     Functional Limitations Info  Sight, Hearing, Speech Sight Info: Impaired (eyeglasses) Hearing Info: Adequate Speech Info: Adequate    SPECIAL CARE FACTORS FREQUENCY  Contractures Contractures Info: Not present    Additional Factors Info  Code Status, Allergies, Psychotropic Code Status Info: DNR Allergies Info: not on file Psychotropic Info: N/A         Current Medications  (11/14/2023):  This is the current hospital active medication list Current Facility-Administered Medications  Medication Dose Route Frequency Provider Last Rate Last Admin   acetaminophen  (TYLENOL ) tablet 650 mg  650 mg Oral Q6H PRN Lonita Roach, MD   650 mg at 11/13/23 1757   Or   acetaminophen  (TYLENOL ) suppository 650 mg  650 mg Rectal Q6H PRN Lonita Roach, MD       antiseptic oral rinse (BIOTENE) solution 15 mL  15 mL Topical PRN Lonita Roach, MD       Chlorhexidine  Gluconate Cloth 2 % PADS 6 each  6 each Topical Daily Lonita Roach, MD   6 each at 11/14/23 1331   feeding supplement (ENSURE PLUS HIGH PROTEIN) liquid 237 mL  237 mL Oral BID BM Haydee Lipa, MD   237 mL at 11/13/23 1228   gabapentin  (NEURONTIN ) capsule 300 mg  300 mg Oral QHS Lonita Roach, MD   300 mg at 11/12/23 2138   glycopyrrolate  (ROBINUL ) tablet 1 mg  1 mg Oral Q4H PRN Lonita Roach, MD       Or   glycopyrrolate  (ROBINUL ) injection 0.2 mg  0.2 mg Intravenous Q4H PRN Lonita Roach, MD       HYDROmorphone  (DILAUDID ) injection 0.5-2 mg  0.5-2 mg Intravenous Q2H PRN Richardson Chang, DO   1 mg at 11/09/23 1610   LORazepam  (ATIVAN ) tablet 0.5 mg  0.5 mg Oral Q4H PRN Lonita Roach, MD   0.5 mg at 11/13/23 1800   Or   LORazepam  (ATIVAN ) 2 MG/ML concentrated solution 0.5 mg  0.5 mg Sublingual Q4H PRN Lonita Roach, MD       Or   LORazepam  (ATIVAN ) injection 0.5 mg  0.5 mg Intravenous Q4H PRN Lonita Roach, MD       Oral care mouth rinse  15 mL Mouth Rinse PRN Lonita Roach, MD       oxyCODONE  (Oxy IR/ROXICODONE ) immediate release tablet 5 mg  5 mg Oral Q4H PRN Richardson Chang, DO   5 mg at 11/14/23 1330   polyvinyl alcohol  (LIQUIFILM TEARS) 1.4 % ophthalmic solution 1 drop  1 drop Both Eyes QID PRN Lonita Roach, MD       prochlorperazine  (COMPAZINE ) injection 5 mg  5 mg Intravenous Q6H PRN Lonita Roach, MD       senna (SENOKOT) tablet 8.6 mg  1 tablet  Oral Daily PRN Lonita Roach, MD       sodium chloride  flush (NS) 0.9 % injection 10-40 mL  10-40 mL Intracatheter Q12H Lonita Roach, MD   10 mL at 11/14/23 1000   sodium chloride  flush (NS) 0.9 % injection 10-40 mL  10-40 mL Intracatheter PRN Lonita Roach, MD       sodium chloride  flush (NS) 0.9 % injection 3 mL  3 mL Intravenous Q12H Lonita Roach, MD   3 mL at 11/14/23 1332     Discharge Medications: Please see discharge summary for a list of discharge medications.  Relevant Imaging Results:  Relevant Lab Results:   Additional Information SSN: [id number]  Bari Leys, RN

## 2023-11-14 NOTE — Progress Notes (Signed)
 [id number] [facility]  Hospice hospital liaison note   Received request from [facility] to reevaluate for [facility]. At this time, patient does not meet criteria for hospice inpatient unit.    He is appropriate for hospice services in the home or LTC facility.   Thank you for the opportunity to participate in this patient's care.   Please call with any questions or concerns.  Thank you, [name], BSN, [facility]  [phone]

## 2023-11-14 NOTE — TOC Progression Note (Addendum)
 Transition of Care [facility]) - Progression Note    Patient Details  Name: Jonathan Page MRN: [id number] Date of Birth: 07-09-1950  Transition of Care [facility]) CM/SW Contact  Bari Leys, RN Phone Number: 11/14/2023, 12:36 PM  Clinical Narrative:  NCM called to Debe Falco [phone]) at [facility]  regarding onsite evaluation today for placement, Deana Eves states they had an urgent issue and unable to make it out today. Case discussed over the telephone, Deana Eves states the are willing to do an evaluation, son will need to complete application for LTC placement with [facility] at the facility,. Will require 1 months community fee and rent $10,000 up front.  NCM will contact pt's son and update Deana Eves with his decision.   -12:39pm NCM called to pt's son, no answer, left vm with NCM name and phone number requesting a call back.     -3:36pm Call received from patient's son, updated on potential placement at [facility] with financial expectations, son agreeable. NCM called back to Terri Fester with [facility], informed son agreeable to proceed with placement process. Provided [employer] NCM email to send paper work over for [facility] to provide to son.   -12;12pm Call received from Debe Falco with [facility] and [place], requesting clinicals be emailed to her at [email or web address], also request NCM contact patient's son to request he give her a call.  NCM called to pt's son, [name], provided phone number to [facility] with request for him to call her, voiced understanding. Clinical emailed to Ms Ace Abu at emailed provided per her request.   Expected Discharge Plan: Home w Hospice Care Barriers to Discharge: Continued Medical Work up  Expected Discharge Plan and Services In-house Referral: Clinical Social Work   Post Acute Care Choice: NA Living arrangements for the past 2 months: Skilled Science writer  Healthcare) Expected Discharge Date: 11/09/23               DME Arranged: N/A DME Agency: NA       HH Arranged: NA           Social Determinants of Health (SDOH) Interventions SDOH Screenings   Food Insecurity: No Food Insecurity (10/31/2023)  Housing: Low Risk  (10/31/2023)  Transportation Needs: No Transportation Needs (10/31/2023)  Utilities: Not At Risk (10/31/2023)  Depression (PHQ2-9): Low Risk  (03/16/2023)  Social Connections: Unknown (10/31/2023)  Tobacco Use: Medium Risk (10/30/2023)    Readmission Risk Interventions    10/31/2023   10:28 AM  Readmission Risk Prevention Plan  Transportation Screening Complete  Medication Review (RN Care Manager) Complete  PCP or Specialist appointment within 3-5 days of discharge Complete  HRI or Home Care Consult Complete  SW Recovery Care/Counseling Consult Complete  Palliative Care Screening Not Complete  Comments TBD  Skilled Nursing Facility Not Complete  SNF Comments TBD

## 2023-11-15 ENCOUNTER — Ambulatory Visit: Admitting: Physician Assistant

## 2023-11-15 ENCOUNTER — Other Ambulatory Visit

## 2023-11-15 ENCOUNTER — Ambulatory Visit

## 2023-11-15 NOTE — Plan of Care (Signed)

## 2023-11-15 NOTE — TOC Progression Note (Signed)
 Transition of Care [facility]) - Progression Note    Patient Details  Name: Jonathan Page MRN: [id number] Date of Birth: 1950-09-26  [facility]) CM/SW Contact  Bari Leys, RN Phone Number: 11/15/2023, 1:34 PM  Clinical Narrative:  12;12pm Call received from Debe Falco with [facility] and [place], requesting clinicals be emailed to her at:  [email or web address], also request NCM contact patient's son to request he give her a call.  NCM called to pt's son, [name], provided phone number to [facility] with request for him to call her, voiced understanding. Clinical emailed to Ms Ace Abu at emailed provided per her request.   -1:34pm Call to Debe Falco w/[facility], confirmed able to accept patient for transfer tomorrow, 6/5 at 10:30am. Report Call to [phone] , ask to speak with Sheppard Dickinson to give report. PTAR for transport.     Expected Discharge Plan: Home w Hospice Care Barriers to Discharge: Continued Medical Work up  Expected Discharge Plan and Services In-house Referral: Clinical Social Work   Post Acute Care Choice: NA Living arrangements for the past 2 months: Skilled Science writer Healthcare) Expected Discharge Date: 11/09/23               DME Arranged: N/A DME Agency: NA       HH Arranged: NA           Social Determinants of Health (SDOH) Interventions SDOH Screenings   Food Insecurity: No Food Insecurity (10/31/2023)  Housing: Low Risk  (10/31/2023)  Transportation Needs: No Transportation Needs (10/31/2023)  Utilities: Not At Risk (10/31/2023)  Depression (PHQ2-9): Low Risk  (03/16/2023)  Social Connections: Unknown (10/31/2023)  Tobacco Use: Medium Risk (10/30/2023)    Readmission Risk Interventions    10/31/2023   10:28 AM  Readmission Risk Prevention Plan  Transportation Screening Complete  Medication Review (RN Care Manager) Complete  PCP or Specialist appointment within 3-5 days of  discharge Complete  HRI or Home Care Consult Complete  SW Recovery Care/Counseling Consult Complete  Palliative Care Screening Not Complete  Comments TBD  Skilled Nursing Facility Not Complete  SNF Comments TBD

## 2023-11-15 NOTE — Progress Notes (Signed)
 Physician Discharge Summary  Jonathan Page:[id number] DOB: 1950/10/04 DOA: 10/30/2023  PCP: [name], NP  Admit date: 10/30/2023 Discharge date: 11/15/2023  Admitted From: SNF Disposition: To be determined  Recommendations for Outpatient Follow-up:  Follow up with hospice team as scheduled:  Discharge Condition: Poor CODE STATUS: DNR Diet recommendation: Comfort feeding as tolerated  Brief/Interim Summary: 73 year old man PMH non-small cell lung cancer metastatic to bone, progressive despite second line therapy, presented with shortness of breath. CT showed progression of disease, patient admitted for new hypoxia with respiratory failure, further evaluation. Treated with antibiotics, underwent thoracentesis with small postprocedure pneumothorax which is stable. Pulmonology recommended conservative management. Seen by oncology with recommendation for no further chemotherapy. Seen by palliative care for ultimately patient elected comfort care. Family declined available hospice bed, now not inpatient hospice eligible.   5/27 Family declined [facility] bed offer 5/28-29: Patient remains stable for discharge - TOC and family working on safe discharge; no longer candidate for [facility] 5/30-31: [facility] offered to evaluate patient for acceptance, patient's son declined referral - tentative plan for discharge home given family continues to refuse offered safe disposition to multiple facilities. Discharge appeal ongoing. [facility] offered bed - have their own hospice/palliative team Discharge denial upheld by [facility] - plan to discharge to long term care facility if family agrees/secures payment for, otherwise patient will discharge home at noon 6/1 per discussion with son, case management, care team and staff. 6/1-present: Patient remains discharged - family continue to push back on discharge - awaiting safe disposition of their choice (which we  have secured at multiple sites) - case management/administration have discussed the aame with patient and patient son--accepted for SNF on 6/5  Discharge Diagnoses:  Principal Problem:   Acute respiratory failure with hypoxia (HCC) Active Problems:   Pleural effusion, bilateral   Iron  deficiency anemia   Essential hypertension   Non-small cell lung cancer metastatic to bone (HCC)   Thrombocytopenia (HCC)   Uncontrolled type 2 diabetes mellitus with hyperglycemia, without long-term current use of insulin  (HCC)   AKI (acute kidney injury) (HCC)   Hypoglycemia   Pneumothorax   Pressure injury of skin   Malignant pleural effusion   DNR (do not resuscitate)   Cancer associated pain   Need for emotional support   Counseling and coordination of care   Acute hypoxic respiratory failure (HCC)   Palliative care encounter   High risk medication use   Concern about end of life   Medication management   Goals of care, counseling/discussion  Acute respiratory failure with hypoxia (HCC) Malignant left pleural effusion Right pleural effusion Non-small cell lung cancer metastatic to bone (HCC) Small left-sided pneumothorax status postthoracentesis Admitted for acute hypoxic respiratory failure, now on oxygen.  Plan pulm on oxygen.  Likely secondary to progression of disease but infection could not be ruled out.  Treated empirically with antibiotics. CT no PE.  Enlarging left upper lobe mass with worsening spread, increasing size and number of pulmonary metastases and nodules, enlarging bilateral pleural effusions, right upper lobe airspace disease pneumonia versus lymphangitic spread, diffuse osseous metastatic disease. Small pneumothorax status postthoracentesis.  Pulmonology recommended conservative management if repeat chest x-ray stable.  Repeat chest x-ray showed stable left apical and lateral hydropneumothorax, no evidence of tension physiology.  Continue conservative management. Per  oncology not a candidate for further oncologic intervention. Comfort care recommended--looks comofrtable     Iron  deficiency anemia Thrombocytopenia (HCC) Anemia likely multifactorial secondary  to malignancy, recent chemotherapy, poor nutritional status  No further monitoring   AKI (acute kidney injury) (HCC) NAGMA No further monitoring   Uncontrolled type 2 diabetes mellitus with hyperglycemia, without long-term current use of insulin  (HCC) Neuropathy Hypoglycemia Glucose low normal.  Hypoglycemia resolved with improving appetite. Holding glipizide , insulin . Continue gabapentin .   PMH SAH one month ago Pressure injury to sacrum stage 3, present on admission -continue wound care  Underweight  Body mass index is 18.47 kg/m.    Disposition From: [facility] To: Residential hospice was planned, but disposition likely d/c in am to SNF  Discharge Instructions   Allergies as of 11/15/2023   Not on File      Medication List     STOP taking these medications    Accu-Chek Aviva Plus test strip Generic drug: glucose blood   Accu-Chek Aviva Plus w/Device Kit   accu-chek soft touch lancets   Blood Glucose Monitoring Suppl Devi   dexamethasone  4 MG tablet Commonly known as: DECADRON    diclofenac  Sodium 1 % Gel Commonly known as: VOLTAREN    docusate sodium  100 MG capsule Commonly known as: COLACE   folic acid  1 MG tablet Commonly known as: FOLVITE    glipiZIDE  10 MG 24 hr tablet Commonly known as: GLUCOTROL  XL   insulin  lispro 100 UNIT/ML injection Commonly known as: HUMALOG   insulin  NPH-regular Human (70-30) 100 UNIT/ML injection   lidocaine -prilocaine  cream Commonly known as: EMLA    lisinopril  10 MG tablet Commonly known as: ZESTRIL    methocarbamol  500 MG tablet Commonly known as: ROBAXIN    ondansetron  8 MG tablet Commonly known as: ZOFRAN    prochlorperazine  10 MG tablet Commonly known as: COMPAZINE        TAKE these  medications    feeding supplement Liqd Take 237 mLs by mouth 2 (two) times daily between meals. What changed: when to take this   gabapentin  300 MG capsule Commonly known as: NEURONTIN  Take 1 capsule (300 mg total) by mouth at bedtime. What changed: when to take this   oxyCODONE  5 MG immediate release tablet Commonly known as: Oxy IR/ROXICODONE  Take 1 tablet (5 mg total) by mouth every 4 (four) hours as needed for moderate pain (pain score 4-6) or severe pain (pain score 7-10) (shortness of breath). What changed:  how much to take reasons to take this Another medication with the same name was removed. Continue taking this medication, and follow the directions you see here.        Not on File  Consultations: Palliative care, IR   Procedures/Studies: DG CHEST PORT 1 VIEW Result Date: 11/01/2023 CLINICAL DATA:  Pneumothorax. EXAM: PORTABLE CHEST 1 VIEW COMPARISON:  Oct 31, 2023. FINDINGS: Stable cardiomediastinal silhouette. Right internal jugular Port-A-Cath is unchanged. Stable bilateral opacities are noted concerning for multifocal pneumonia. Small left pleural effusion is noted. Stable possible minimal left apical and basilar pneumothorax. Bony thorax is unremarkable. IMPRESSION: Stable bilateral opacities concerning for multifocal pneumonia. Small left pleural effusion. Stable minimal left-sided pneumothorax. Electronically Signed   By: Rosalene Colon M.D.   On: 11/01/2023 12:00   DG CHEST PORT 1 VIEW Result Date: 10/31/2023 CLINICAL DATA:  Pneumothorax EXAM: PORTABLE CHEST 1 VIEW COMPARISON:  10/31/2023 FINDINGS: Small left apical and lateral hydropneumothorax demonstrating a small fluid component is stable. No radiographic evidence of tension physiology. No pneumothorax lumbar right. Small right pleural effusion is stable. Extensive multifocal airspace infiltrate, more severe within the right upper lung zone and perihilar regions, is unchanged in keeping with  multifocal  infection. Right internal jugular chest port tip is unchanged within the right atrium. Cardiac size within normal limits. IMPRESSION: 1. Stable small left apical and lateral hydropneumothorax. No radiographic evidence of tension physiology. 2. Stable multifocal pulmonary infiltrate. 3. Stable small right pleural effusion and a Electronically Signed   By: Worthy Heads M.D.   On: 10/31/2023 20:25   US  THORACENTESIS ASP PLEURAL SPACE W/IMG GUIDE Result Date: 10/31/2023 INDICATION: 73 year old male with metastatic non-small-cell lung cancer, with new onset shortness of breath, and bilateral pleural effusion, left greater than right. IR was requested for diagnostic and therapeutic thoracentesis. EXAM: ULTRASOUND GUIDED DIAGNOSTIC AND THERAPEUTIC THORACENTESIS MEDICATIONS: 6 cc of 1% lidocaine  COMPLICATIONS: None immediate. PROCEDURE: An ultrasound guided thoracentesis was thoroughly discussed with the patient and questions answered. The benefits, risks, alternatives and complications were also discussed. The patient understands and wishes to proceed with the procedure. Written consent was obtained. Ultrasound was performed to localize and mark an adequate pocket of fluid in the left chest. The area was then prepped and draped in the normal sterile fashion. 1% Lidocaine  was used for local anesthesia. Under ultrasound guidance a 6 Fr Safe-T-Centesis catheter was introduced. Thoracentesis was performed. The catheter was removed and a dressing applied. FINDINGS: A total of approximately 1.10 L of clear, straw-colored pleural fluid was removed. Samples were sent to the laboratory as requested by the clinical team. IMPRESSION: Successful ultrasound guided left thoracentesis yielding 1.1 L of pleural fluid. Procedure performed by Lambert Pillion, PA-C Carim, PA-C Electronically Signed   By: Creasie Doctor M.D.   On: 10/31/2023 15:53   DG Chest 1 View Result Date: 10/31/2023 CLINICAL DATA:  Status post thoracentesis EXAM:  CHEST  1 VIEW COMPARISON:  Chest x-ray 10/29/2023. FINDINGS: There is a small left-sided pneumothorax which is new from prior. There is a small left pleural effusion which has significantly decreased. Bilateral airspace disease, right greater than left, has not significantly changed. Cardiomediastinal silhouette is within normal limits. Right chest port catheter tip projects over the distal SVC. No acute fractures are identified IMPRESSION: 1. New small left-sided pneumothorax. 2. Small left pleural effusion which has significantly decreased. 3. Stable bilateral airspace disease. Electronically Signed   By: Tyron Gallon M.D.   On: 10/31/2023 15:10   CT Angio Chest Pulmonary Embolism (PE) W or WO Contrast Result Date: 10/31/2023 CLINICAL DATA:  Shortness of breath, low O2 sats. EXAM: CT ANGIOGRAPHY CHEST WITH CONTRAST TECHNIQUE: Multidetector CT imaging of the chest was performed using the standard protocol during bolus administration of intravenous contrast. Multiplanar CT image reconstructions and MIPs were obtained to evaluate the vascular anatomy. RADIATION DOSE REDUCTION: This exam was performed according to the departmental dose-optimization program which includes automated exposure control, adjustment of the mA and/or kV according to patient size and/or use of iterative reconstruction technique. CONTRAST:  60mL OMNIPAQUE  IOHEXOL  350 MG/ML SOLN COMPARISON:  Chest CT 09/11/2023. FINDINGS: Cardiovascular: No filling defects in the pulmonary arteries to suggest pulmonary emboli. Heart is normal size. Aorta is normal caliber. Right Port-A-Cath remains in place, unchanged. Mediastinum/Nodes: Infiltrating tumor invading the left side of the mediastinum and into the left AP window, progressed since prior study. Worsening mediastinal and bilateral hilar adenopathy. No visible axillary adenopathy. Lungs/Pleura: Large loculated left pleural effusion and moderate right pleural effusion, increasing since prior study.  Increasing size and number of metastases throughout the lungs. Central left upper lobe mass now measures 4.8 x 4.1 cm compared to 4.3 x 3.6 cm. Progressive tumor extension and  endobronchial spread throughout the left upper lobe with confluent soft tissue thickening again noted along the peribronchovascular bundle, worsening since prior study. Extensive airspace disease throughout the right upper lobe could reflect pneumonia or lymphangitic spread of tumor. Upper Abdomen: No acute findings Musculoskeletal: Soft tissue nodule superficial to xiphoid process measures 2.6 cm, stable. Diffuse osseous metastatic disease again noted, likely slightly progressed since prior study. Review of the MIP images confirms the above findings. IMPRESSION: No evidence of pulmonary embolus. Enlarging left upper lobe mass with worsening spread a along the peribronchovascular bundle in the left upper lobe. Increasing size and number of pulmonary metastases/nodules. Worsening mediastinal and bilateral hilar adenopathy. Enlarging bilateral pleural effusions, left larger than right. Airspace disease throughout the right upper lobe could reflect pneumonia or lymphangitic spread of tumor. Diffuse osseous metastatic disease, likely progressed since prior study. Electronically Signed   By: Janeece Mechanic M.D.   On: 10/31/2023 00:11   DG Chest Port 1 View Result Date: 10/30/2023 CLINICAL DATA:  Shortness of breath EXAM: PORTABLE CHEST 1 VIEW COMPARISON:  10/05/2023, CT 09/11/2023 FINDINGS: Right-sided central venous port tip at the cavoatrial junction. Small right-sided pleural effusion, small moderate probably loculated left pleural effusion, increased compared to prior radiograph. Distortion and irregular density in the left upper lobe, perihilar region appears more dense radiographically compared to prior. Persistent dense left lung base consolidation. New or worsened diffuse right-sided pulmonary infiltrates or edema. No pneumothorax  IMPRESSION: 1. Small right-sided pleural effusion, small to moderate probably loculated left pleural effusion, increased compared to prior radiograph. 2. New or worsened diffuse right-sided pulmonary infiltrates or edema. 3. Distortion and irregular density in the left upper lobe, perihilar region appears more dense radiographically compared to prior and corresponds to known history of malignancy. Persistent dense left lung base consolidation. Electronically Signed   By: Esmeralda Hedge M.D.   On: 10/30/2023 20:42     Subjective: No acute issues or events overnight denies nausea vomiting diarrhea constipation headache fevers chills or chest pain   Discharge Exam: Vitals:   11/14/23 2200 11/15/23 1352  BP: 107/74 103/70  Pulse: (!) 108 (!) 104  Resp: 18 18  Temp: 98.9 F (37.2 C) 97.9 F (36.6 C)  SpO2: 97% 100%   Vitals:   11/14/23 1100 11/14/23 1320 11/14/23 2200 11/15/23 1352  BP:  100/74 107/74 103/70  Pulse:  (!) 109 (!) 108 (!) 104  Resp:  18 18 18   Temp:  97.6 F (36.4 C) 98.9 F (37.2 C) 97.9 F (36.6 C)  TempSrc:  Oral Oral Oral  SpO2: 90% 94% 97% 100%  Weight:      Height:        General: Pt is alert, awake, not in acute distress Cardiovascular: RRR, S1/S2 +, no rubs, no gallops Respiratory: CTA bilaterally, no wheezing, no rhonchi Abdominal: Soft, NT, ND, bowel sounds + Extremities: no edema, no cyanosis   The results of significant diagnostics from this hospitalization (including imaging, microbiology, ancillary and laboratory) are listed below for reference.     Microbiology: No results found for this or any previous visit (from the past 240 hours).    Labs: BNP (last 3 results) Recent Labs    10/30/23 1945  BNP 269.3*     Time spent: Over 30 minutes  SIGNED:   Jai-Gurmukh Josealberto Montalto, DO [facility] 11/15/2023, 2:36 PM Pager   If 7PM-7AM, please contact night-coverage [email or web address]

## 2023-11-16 ENCOUNTER — Inpatient Hospital Stay

## 2023-11-16 MED ORDER — OXYCODONE HCL 5 MG PO TABS: 5.0000 mg | ORAL_TABLET | ORAL | 0 refills | Status: DC | PRN

## 2023-11-16 NOTE — TOC Transition Note (Addendum)
 Transition of Care [facility]) - Discharge Note   Patient Details  Name: Jonathan Page MRN: [id number] Date of Birth: 08/27/1950  Transition of Care [facility]) CM/SW Contact:  Levie Ream, RN Phone Number: 11/16/2023, 10:53 AM   Clinical Narrative:    D/C orders received; pt d/c to [facility]; transport by Lyna Sandhoff; call report # SPTAR has been called; [phone] (POC [name]); pt's son [name] notified; he agreed to d/c plan; PTAR called at 1044; spoke w/ [name]; no TOC needs.  -1150- notified by [name] Med list and d/c summary need to faxed to [phone], POC [name]; faxed per request; awaiting electronic confirmation  -1201- electronic confirmation received Final next level of care: Skilled Nursing Facility Barriers to Discharge: No Barriers Identified   Patient Goals and CMS Choice Patient states their goals for this hospitalization and ongoing recovery are:: To return home CMS Medicare.gov Compare Post Acute Care list provided to:: Patient Choice offered to / list presented to : Patient Faith ownership interest in [name].provided to:: Patient    Discharge Placement              Patient chooses bed at: Other - please specify in the comment section below: [facility]) Patient to be transferred to facility by: PTAR Name of family member notified: [name] (son) [phone] Patient and family notified of of transfer: 11/16/23  Discharge Plan and Services Additional resources added to the After Visit Summary for   In-house Referral: Clinical Social Work   Post Acute Care Choice: NA          DME Arranged: N/A DME Agency: NA       HH Arranged: NA          Social Drivers of Health (SDOH) Interventions SDOH Screenings   Food Insecurity: No Food Insecurity (10/31/2023)  Housing: Low Risk  (10/31/2023)  Transportation Needs: No Transportation Needs (10/31/2023)  Utilities: Not At Risk (10/31/2023)   Depression (PHQ2-9): Low Risk  (03/16/2023)  Social Connections: Unknown (10/31/2023)  Tobacco Use: Medium Risk (10/30/2023)     Readmission Risk Interventions    10/31/2023   10:28 AM  Readmission Risk Prevention Plan  Transportation Screening Complete  Medication Review (RN Care Manager) Complete  PCP or Specialist appointment within 3-5 days of discharge Complete  HRI or Home Care Consult Complete  SW Recovery Care/Counseling Consult Complete  Palliative Care Screening Not Complete  Comments TBD  Skilled Nursing Facility Not Complete  SNF Comments TBD

## 2023-11-16 NOTE — Progress Notes (Signed)
 Report called to Sheppard Dickinson at [facility].  Pt transported by PTAR.

## 2023-11-16 NOTE — Discharge Summary (Signed)
 Physician Discharge Summary  Jonathan Page ZOX:[id number] DOB: 1951-06-02 DOA: 10/30/2023  PCP: Jonathan John, NP  Admit date: 10/30/2023 Discharge date: 11/16/2023  Time spent: 43 minutes  Recommendations for Outpatient Follow-up:  Patient discharging to [facility] for end-of-life care  Discharge Diagnoses:  MAIN problem for hospitalization   End-stage lung cancer  Please see below for itemized issues addressed in HOpsital- refer to other progress notes for clarity if needed  Discharge Condition: Guarded  Diet recommendation: Comfort related  Filed Weights   11/06/23 0901  Weight: 66.8 kg    History of present illness:  73 year old black male NSCLC stage IV with mets to bone despite second line chemo, HTN, DM TY 2 Prior fall with subarachnoid hemorrhage on 10/02/2023 admission nonoperatively managed Anemia of malignancy Admitted with increasing dyspnea progressing to dyspnea at rest Chest x-ray showed small right pleural effusion new worsening infiltrates on the right side CT showed worsening metastatic disease increased pleural effusions versus lymphangitic spread Patient was seen by palliative care than hospice Discussions were carried out with patient family and seen by oncology with recommendation for no further chemo Hospitalization complicated by difficulty with placement Ultimately was accepted to [facility] states for end-of-life care-will be going on comfort related medications as per orders per Dr. Rudine Cos No new changes since last visit-expect poor prognosis overall  Discharge Exam: Vitals:   11/15/23 1352 11/16/23 0203  BP: 103/70 100/70  Pulse: (!) 104 (!) 105  Resp: 18 19  Temp: 97.9 F (36.6 C) 98.2 F (36.8 C)  SpO2: 100% 93%    Subj on day of d/c   Awake  somewhat weak but overall looks well  General Exam on discharge  EOMI NCAT no focal deficit no icterus no pallor quite cachectic S1-S2 no murmur Chest clear no  wheeze No lower extremity edema Neuro intact  Discharge Instructions   Discharge Instructions     Discharge patient   Complete by: As directed    Discharge disposition: [facility] Not Defined   Discharge patient date: 11/16/2023      Allergies as of 11/16/2023   Not on File      Medication List     STOP taking these medications    Accu-Chek Aviva Plus test strip Generic drug: glucose blood   Accu-Chek Aviva Plus w/Device Kit   accu-chek soft touch lancets   Blood Glucose Monitoring Suppl Devi   dexamethasone  4 MG tablet Commonly known as: DECADRON    diclofenac  Sodium 1 % Gel Commonly known as: VOLTAREN    docusate sodium  100 MG capsule Commonly known as: COLACE   folic acid  1 MG tablet Commonly known as: FOLVITE    glipiZIDE  10 MG 24 hr tablet Commonly known as: GLUCOTROL  XL   insulin  lispro 100 UNIT/ML injection Commonly known as: HUMALOG   insulin  NPH-regular Human (70-30) 100 UNIT/ML injection   lidocaine -prilocaine  cream Commonly known as: EMLA    lisinopril  10 MG tablet Commonly known as: ZESTRIL    methocarbamol  500 MG tablet Commonly known as: ROBAXIN    ondansetron  8 MG tablet Commonly known as: ZOFRAN    prochlorperazine  10 MG tablet Commonly known as: COMPAZINE        TAKE these medications    feeding supplement Liqd Take 237 mLs by mouth 2 (two) times daily between meals. What changed: when to take this   gabapentin  300 MG capsule Commonly known as: NEURONTIN  Take 1 capsule (300 mg total) by mouth at bedtime. What changed: when to take this  oxyCODONE  5 MG immediate release tablet Commonly known as: Oxy IR/ROXICODONE  Take 1 tablet (5 mg total) by mouth every 4 (four) hours as needed for moderate pain (pain score 4-6) or severe pain (pain score 7-10) (shortness of breath). What changed:  how much to take reasons to take this Another medication with the same name was removed. Continue taking this  medication, and follow the directions you see here.       Not on File    The results of significant diagnostics from this hospitalization (including imaging, microbiology, ancillary and laboratory) are listed below for reference.    Significant Diagnostic Studies: DG CHEST PORT 1 VIEW Result Date: 11/01/2023 CLINICAL DATA:  Pneumothorax. EXAM: PORTABLE CHEST 1 VIEW COMPARISON:  Oct 31, 2023. FINDINGS: Stable cardiomediastinal silhouette. Right internal jugular Port-A-Cath is unchanged. Stable bilateral opacities are noted concerning for multifocal pneumonia. Small left pleural effusion is noted. Stable possible minimal left apical and basilar pneumothorax. Bony thorax is unremarkable. IMPRESSION: Stable bilateral opacities concerning for multifocal pneumonia. Small left pleural effusion. Stable minimal left-sided pneumothorax. Electronically Signed   By: Rosalene Colon M.D.   On: 11/01/2023 12:00   DG CHEST PORT 1 VIEW Result Date: 10/31/2023 CLINICAL DATA:  Pneumothorax EXAM: PORTABLE CHEST 1 VIEW COMPARISON:  10/31/2023 FINDINGS: Small left apical and lateral hydropneumothorax demonstrating a small fluid component is stable. No radiographic evidence of tension physiology. No pneumothorax lumbar right. Small right pleural effusion is stable. Extensive multifocal airspace infiltrate, more severe within the right upper lung zone and perihilar regions, is unchanged in keeping with multifocal infection. Right internal jugular chest port tip is unchanged within the right atrium. Cardiac size within normal limits. IMPRESSION: 1. Stable small left apical and lateral hydropneumothorax. No radiographic evidence of tension physiology. 2. Stable multifocal pulmonary infiltrate. 3. Stable small right pleural effusion and a Electronically Signed   By: Worthy Heads M.D.   On: 10/31/2023 20:25   US  THORACENTESIS ASP PLEURAL SPACE W/IMG GUIDE Result Date: 10/31/2023 INDICATION: 73 year old male with  metastatic non-small-cell lung cancer, with new onset shortness of breath, and bilateral pleural effusion, left greater than right. IR was requested for diagnostic and therapeutic thoracentesis. EXAM: ULTRASOUND GUIDED DIAGNOSTIC AND THERAPEUTIC THORACENTESIS MEDICATIONS: 6 cc of 1% lidocaine  COMPLICATIONS: None immediate. PROCEDURE: An ultrasound guided thoracentesis was thoroughly discussed with the patient and questions answered. The benefits, risks, alternatives and complications were also discussed. The patient understands and wishes to proceed with the procedure. Written consent was obtained. Ultrasound was performed to localize and mark an adequate pocket of fluid in the left chest. The area was then prepped and draped in the normal sterile fashion. 1% Lidocaine  was used for local anesthesia. Under ultrasound guidance a 6 Fr Safe-T-Centesis catheter was introduced. Thoracentesis was performed. The catheter was removed and a dressing applied. FINDINGS: A total of approximately 1.10 L of clear, straw-colored pleural fluid was removed. Samples were sent to the laboratory as requested by the clinical team. IMPRESSION: Successful ultrasound guided left thoracentesis yielding 1.1 L of pleural fluid. Procedure performed by Lambert Pillion, PA-C Carim, PA-C Electronically Signed   By: Creasie Doctor M.D.   On: 10/31/2023 15:53   DG Chest 1 View Result Date: 10/31/2023 CLINICAL DATA:  Status post thoracentesis EXAM: CHEST  1 VIEW COMPARISON:  Chest x-ray 10/29/2023. FINDINGS: There is a small left-sided pneumothorax which is new from prior. There is a small left pleural effusion which has significantly decreased. Bilateral airspace disease, right greater than left, has not  significantly changed. Cardiomediastinal silhouette is within normal limits. Right chest port catheter tip projects over the distal SVC. No acute fractures are identified IMPRESSION: 1. New small left-sided pneumothorax. 2. Small left pleural  effusion which has significantly decreased. 3. Stable bilateral airspace disease. Electronically Signed   By: Tyron Gallon M.D.   On: 10/31/2023 15:10   CT Angio Chest Pulmonary Embolism (PE) W or WO Contrast Result Date: 10/31/2023 CLINICAL DATA:  Shortness of breath, low O2 sats. EXAM: CT ANGIOGRAPHY CHEST WITH CONTRAST TECHNIQUE: Multidetector CT imaging of the chest was performed using the standard protocol during bolus administration of intravenous contrast. Multiplanar CT image reconstructions and MIPs were obtained to evaluate the vascular anatomy. RADIATION DOSE REDUCTION: This exam was performed according to the departmental dose-optimization program which includes automated exposure control, adjustment of the mA and/or kV according to patient size and/or use of iterative reconstruction technique. CONTRAST:  60mL OMNIPAQUE  IOHEXOL  350 MG/ML SOLN COMPARISON:  Chest CT 09/11/2023. FINDINGS: Cardiovascular: No filling defects in the pulmonary arteries to suggest pulmonary emboli. Heart is normal size. Aorta is normal caliber. Right Port-A-Cath remains in place, unchanged. Mediastinum/Nodes: Infiltrating tumor invading the left side of the mediastinum and into the left AP window, progressed since prior study. Worsening mediastinal and bilateral hilar adenopathy. No visible axillary adenopathy. Lungs/Pleura: Large loculated left pleural effusion and moderate right pleural effusion, increasing since prior study. Increasing size and number of metastases throughout the lungs. Central left upper lobe mass now measures 4.8 x 4.1 cm compared to 4.3 x 3.6 cm. Progressive tumor extension and endobronchial spread throughout the left upper lobe with confluent soft tissue thickening again noted along the peribronchovascular bundle, worsening since prior study. Extensive airspace disease throughout the right upper lobe could reflect pneumonia or lymphangitic spread of tumor. Upper Abdomen: No acute findings  Musculoskeletal: Soft tissue nodule superficial to xiphoid process measures 2.6 cm, stable. Diffuse osseous metastatic disease again noted, likely slightly progressed since prior study. Review of the MIP images confirms the above findings. IMPRESSION: No evidence of pulmonary embolus. Enlarging left upper lobe mass with worsening spread a along the peribronchovascular bundle in the left upper lobe. Increasing size and number of pulmonary metastases/nodules. Worsening mediastinal and bilateral hilar adenopathy. Enlarging bilateral pleural effusions, left larger than right. Airspace disease throughout the right upper lobe could reflect pneumonia or lymphangitic spread of tumor. Diffuse osseous metastatic disease, likely progressed since prior study. Electronically Signed   By: Janeece Mechanic M.D.   On: 10/31/2023 00:11   DG Chest Port 1 View Result Date: 10/30/2023 CLINICAL DATA:  Shortness of breath EXAM: PORTABLE CHEST 1 VIEW COMPARISON:  10/05/2023, CT 09/11/2023 FINDINGS: Right-sided central venous port tip at the cavoatrial junction. Small right-sided pleural effusion, small moderate probably loculated left pleural effusion, increased compared to prior radiograph. Distortion and irregular density in the left upper lobe, perihilar region appears more dense radiographically compared to prior. Persistent dense left lung base consolidation. New or worsened diffuse right-sided pulmonary infiltrates or edema. No pneumothorax IMPRESSION: 1. Small right-sided pleural effusion, small to moderate probably loculated left pleural effusion, increased compared to prior radiograph. 2. New or worsened diffuse right-sided pulmonary infiltrates or edema. 3. Distortion and irregular density in the left upper lobe, perihilar region appears more dense radiographically compared to prior and corresponds to known history of malignancy. Persistent dense left lung base consolidation. Electronically Signed   By: Esmeralda Hedge M.D.   On:  10/30/2023 20:42    Microbiology: No results found for  this or any previous visit (from the past 240 hours).   Labs: Basic Metabolic Panel: No results for input(s): "NA", "K", "CL", "CO2", "GLUCOSE", "BUN", "CREATININE", "CALCIUM ", "MG", "PHOS" in the last 168 hours. Liver Function Tests: No results for input(s): "AST", "ALT", "ALKPHOS", "BILITOT", "PROT", "ALBUMIN " in the last 168 hours. No results for input(s): "LIPASE", "AMYLASE" in the last 168 hours. No results for input(s): "AMMONIA" in the last 168 hours. CBC: No results for input(s): "WBC", "NEUTROABS", "HGB", "HCT", "MCV", "PLT" in the last 168 hours. Cardiac Enzymes: No results for input(s): "CKTOTAL", "CKMB", "CKMBINDEX", "TROPONINI" in the last 168 hours. BNP: BNP (last 3 results) Recent Labs    10/30/23 1945  BNP 269.3*    ProBNP (last 3 results) No results for input(s): "PROBNP" in the last 8760 hours.  CBG: No results for input(s): "GLUCAP" in the last 168 hours.  Signed:  Jai-Gurmukh Jhace Fennell MD   [facility] 11/16/2023, 11:11 AM

## 2023-11-17 ENCOUNTER — Ambulatory Visit

## 2023-11-17 ENCOUNTER — Telehealth: Payer: Self-pay

## 2023-11-17 NOTE — Transitions of Care (Post Inpatient/ED Visit) (Signed)
   11/17/2023  Name: Jonathan Page MRN: [id number] DOB: [birth year]  Chart reviewed and reveals that patient was admitted to [facility] facility and with Hospice care.  No call was warranted.   Brown [place], RN, BSN, CCM [facility], [facility] RN Care Manager Direct Dial: [phone]

## 2023-11-21 ENCOUNTER — Telehealth: Payer: Self-pay | Admitting: Medical Oncology

## 2023-11-21 NOTE — Telephone Encounter (Signed)
 Brother asking for address for Hospice facility where family and friends can send cards to  [facility] facility and with Hospice care . I told Jonathan Page , I cannot get that information.

## 2023-11-23 ENCOUNTER — Inpatient Hospital Stay

## 2023-11-27 ENCOUNTER — Telehealth: Payer: Self-pay

## 2023-11-27 NOTE — Transitions of Care (Post Inpatient/ED Visit) (Signed)
   11/27/2023  Name: Jonathan Page MRN: [id number] DOB: 1950/07/19  Contacted [facility] and spoke with Deana Eves who confirmed patient is deceased.   Brown [place], RN, BSN, CCM [facility], [facility] RN Care Manager Direct Dial: [phone]

## 2023-11-29 ENCOUNTER — Ambulatory Visit

## 2023-11-29 ENCOUNTER — Encounter

## 2023-11-29 ENCOUNTER — Ambulatory Visit: Admitting: Internal Medicine

## 2023-11-29 ENCOUNTER — Other Ambulatory Visit

## 2023-12-01 ENCOUNTER — Ambulatory Visit

## 2023-12-07 ENCOUNTER — Other Ambulatory Visit

## 2023-12-07 ENCOUNTER — Ambulatory Visit: Admitting: Internal Medicine

## 2023-12-07 ENCOUNTER — Ambulatory Visit

## 2023-12-09 ENCOUNTER — Ambulatory Visit

## 2023-12-12 DEATH — deceased

## 2023-12-27 ENCOUNTER — Ambulatory Visit: Admitting: Physician Assistant

## 2023-12-27 ENCOUNTER — Ambulatory Visit

## 2023-12-27 ENCOUNTER — Other Ambulatory Visit

## 2023-12-29 ENCOUNTER — Ambulatory Visit

## 2024-01-17 ENCOUNTER — Other Ambulatory Visit

## 2024-01-17 ENCOUNTER — Ambulatory Visit: Admitting: Internal Medicine

## 2024-01-17 ENCOUNTER — Ambulatory Visit

## 2024-01-19 ENCOUNTER — Ambulatory Visit

## 2024-04-15 ENCOUNTER — Encounter: Payer: Self-pay | Admitting: Radiology

## 2024-05-06 ENCOUNTER — Encounter: Payer: Self-pay | Admitting: Oncology
